# Patient Record
Sex: Female | Born: 1965 | Race: White | Hispanic: No | Marital: Married | Smoking: Never smoker
Health system: Southern US, Community
[De-identification: ages and names within clinical notes are randomized; demographics above are authoritative.]

## PROBLEM LIST (undated history)

## (undated) DIAGNOSIS — I10 Essential (primary) hypertension: Secondary | ICD-10-CM

## (undated) DIAGNOSIS — I639 Cerebral infarction, unspecified: Secondary | ICD-10-CM

## (undated) HISTORY — DX: Cerebral infarction, unspecified: I63.9

---

## 2015-07-12 ENCOUNTER — Inpatient Hospital Stay (HOSPITAL_COMMUNITY)
Admission: EM | Admit: 2015-07-12 | Discharge: 2015-07-24 | DRG: 004 | Disposition: A | Discharge status: Rehabilitation Facility | Payer: Self-pay | Attending: Neurology | Admitting: Neurology

## 2015-07-12 ENCOUNTER — Encounter (HOSPITAL_COMMUNITY): Payer: Self-pay | Admitting: Emergency Medicine

## 2015-07-12 ENCOUNTER — Emergency Department (HOSPITAL_COMMUNITY): Payer: Self-pay

## 2015-07-12 DIAGNOSIS — I5189 Other ill-defined heart diseases: Secondary | ICD-10-CM | POA: Insufficient documentation

## 2015-07-12 DIAGNOSIS — R0682 Tachypnea, not elsewhere classified: Secondary | ICD-10-CM | POA: Diagnosis not present

## 2015-07-12 DIAGNOSIS — I611 Nontraumatic intracerebral hemorrhage in hemisphere, cortical: Principal | ICD-10-CM | POA: Diagnosis present

## 2015-07-12 DIAGNOSIS — Z4659 Encounter for fitting and adjustment of other gastrointestinal appliance and device: Secondary | ICD-10-CM

## 2015-07-12 DIAGNOSIS — R29810 Facial weakness: Secondary | ICD-10-CM | POA: Diagnosis present

## 2015-07-12 DIAGNOSIS — J9690 Respiratory failure, unspecified, unspecified whether with hypoxia or hypercapnia: Secondary | ICD-10-CM

## 2015-07-12 DIAGNOSIS — Y95 Nosocomial condition: Secondary | ICD-10-CM | POA: Diagnosis not present

## 2015-07-12 DIAGNOSIS — N270 Small kidney, unilateral: Secondary | ICD-10-CM | POA: Diagnosis present

## 2015-07-12 DIAGNOSIS — E870 Hyperosmolality and hypernatremia: Secondary | ICD-10-CM | POA: Diagnosis not present

## 2015-07-12 DIAGNOSIS — G8191 Hemiplegia, unspecified affecting right dominant side: Secondary | ICD-10-CM | POA: Diagnosis present

## 2015-07-12 DIAGNOSIS — I639 Cerebral infarction, unspecified: Secondary | ICD-10-CM

## 2015-07-12 DIAGNOSIS — T502X5A Adverse effect of carbonic-anhydrase inhibitors, benzothiadiazides and other diuretics, initial encounter: Secondary | ICD-10-CM | POA: Diagnosis present

## 2015-07-12 DIAGNOSIS — Z79899 Other long term (current) drug therapy: Secondary | ICD-10-CM

## 2015-07-12 DIAGNOSIS — Z9119 Patient's noncompliance with other medical treatment and regimen: Secondary | ICD-10-CM

## 2015-07-12 DIAGNOSIS — W19XXXA Unspecified fall, initial encounter: Secondary | ICD-10-CM | POA: Diagnosis present

## 2015-07-12 DIAGNOSIS — J9600 Acute respiratory failure, unspecified whether with hypoxia or hypercapnia: Secondary | ICD-10-CM | POA: Insufficient documentation

## 2015-07-12 DIAGNOSIS — R05 Cough: Secondary | ICD-10-CM

## 2015-07-12 DIAGNOSIS — I509 Heart failure, unspecified: Secondary | ICD-10-CM | POA: Diagnosis present

## 2015-07-12 DIAGNOSIS — F4323 Adjustment disorder with mixed anxiety and depressed mood: Secondary | ICD-10-CM | POA: Diagnosis present

## 2015-07-12 DIAGNOSIS — I160 Hypertensive urgency: Secondary | ICD-10-CM | POA: Diagnosis present

## 2015-07-12 DIAGNOSIS — Z930 Tracheostomy status: Secondary | ICD-10-CM | POA: Insufficient documentation

## 2015-07-12 DIAGNOSIS — R058 Other specified cough: Secondary | ICD-10-CM

## 2015-07-12 DIAGNOSIS — I69391 Dysphagia following cerebral infarction: Secondary | ICD-10-CM | POA: Insufficient documentation

## 2015-07-12 DIAGNOSIS — Z0189 Encounter for other specified special examinations: Secondary | ICD-10-CM

## 2015-07-12 DIAGNOSIS — I739 Peripheral vascular disease, unspecified: Secondary | ICD-10-CM | POA: Diagnosis present

## 2015-07-12 DIAGNOSIS — R061 Stridor: Secondary | ICD-10-CM | POA: Diagnosis present

## 2015-07-12 DIAGNOSIS — R17 Unspecified jaundice: Secondary | ICD-10-CM | POA: Diagnosis present

## 2015-07-12 DIAGNOSIS — Y92009 Unspecified place in unspecified non-institutional (private) residence as the place of occurrence of the external cause: Secondary | ICD-10-CM

## 2015-07-12 DIAGNOSIS — D62 Acute posthemorrhagic anemia: Secondary | ICD-10-CM | POA: Diagnosis present

## 2015-07-12 DIAGNOSIS — R32 Unspecified urinary incontinence: Secondary | ICD-10-CM | POA: Diagnosis present

## 2015-07-12 DIAGNOSIS — R7989 Other specified abnormal findings of blood chemistry: Secondary | ICD-10-CM | POA: Diagnosis present

## 2015-07-12 DIAGNOSIS — Y838 Other surgical procedures as the cause of abnormal reaction of the patient, or of later complication, without mention of misadventure at the time of the procedure: Secondary | ICD-10-CM | POA: Diagnosis not present

## 2015-07-12 DIAGNOSIS — E669 Obesity, unspecified: Secondary | ICD-10-CM | POA: Insufficient documentation

## 2015-07-12 DIAGNOSIS — R4701 Aphasia: Secondary | ICD-10-CM | POA: Diagnosis present

## 2015-07-12 DIAGNOSIS — G936 Cerebral edema: Secondary | ICD-10-CM | POA: Diagnosis present

## 2015-07-12 DIAGNOSIS — Y92239 Unspecified place in hospital as the place of occurrence of the external cause: Secondary | ICD-10-CM | POA: Diagnosis not present

## 2015-07-12 DIAGNOSIS — I701 Atherosclerosis of renal artery: Secondary | ICD-10-CM | POA: Diagnosis present

## 2015-07-12 DIAGNOSIS — J9601 Acute respiratory failure with hypoxia: Secondary | ICD-10-CM | POA: Diagnosis present

## 2015-07-12 DIAGNOSIS — Z01818 Encounter for other preprocedural examination: Secondary | ICD-10-CM

## 2015-07-12 DIAGNOSIS — R197 Diarrhea, unspecified: Secondary | ICD-10-CM | POA: Diagnosis present

## 2015-07-12 DIAGNOSIS — I161 Hypertensive emergency: Secondary | ICD-10-CM | POA: Diagnosis present

## 2015-07-12 DIAGNOSIS — E876 Hypokalemia: Secondary | ICD-10-CM | POA: Diagnosis present

## 2015-07-12 DIAGNOSIS — J690 Pneumonitis due to inhalation of food and vomit: Secondary | ICD-10-CM | POA: Diagnosis not present

## 2015-07-12 DIAGNOSIS — I10 Essential (primary) hypertension: Secondary | ICD-10-CM | POA: Insufficient documentation

## 2015-07-12 DIAGNOSIS — I110 Hypertensive heart disease with heart failure: Secondary | ICD-10-CM | POA: Diagnosis present

## 2015-07-12 DIAGNOSIS — Z91199 Patient's noncompliance with other medical treatment and regimen due to unspecified reason: Secondary | ICD-10-CM | POA: Insufficient documentation

## 2015-07-12 DIAGNOSIS — Z6841 Body Mass Index (BMI) 40.0 and over, adult: Secondary | ICD-10-CM

## 2015-07-12 DIAGNOSIS — I619 Nontraumatic intracerebral hemorrhage, unspecified: Secondary | ICD-10-CM | POA: Diagnosis present

## 2015-07-12 DIAGNOSIS — R1310 Dysphagia, unspecified: Secondary | ICD-10-CM | POA: Diagnosis present

## 2015-07-12 DIAGNOSIS — R739 Hyperglycemia, unspecified: Secondary | ICD-10-CM | POA: Diagnosis present

## 2015-07-12 DIAGNOSIS — I613 Nontraumatic intracerebral hemorrhage in brain stem: Secondary | ICD-10-CM | POA: Diagnosis present

## 2015-07-12 DIAGNOSIS — J95851 Ventilator associated pneumonia: Secondary | ICD-10-CM | POA: Diagnosis not present

## 2015-07-12 HISTORY — DX: Cerebral infarction, unspecified: I63.9

## 2015-07-12 HISTORY — DX: Essential (primary) hypertension: I10

## 2015-07-12 LAB — PROTIME-INR
INR: 1.18 (ref 0.00–1.49)
Prothrombin Time: 15.1 seconds (ref 11.6–15.2)

## 2015-07-12 LAB — DIFFERENTIAL
BASOS ABS: 0 10*3/uL (ref 0.0–0.1)
Basophils Relative: 0 %
EOS PCT: 3 %
Eosinophils Absolute: 0.3 10*3/uL (ref 0.0–0.7)
LYMPHS ABS: 1.2 10*3/uL (ref 0.7–4.0)
Lymphocytes Relative: 11 %
MONO ABS: 0.6 10*3/uL (ref 0.1–1.0)
Monocytes Relative: 5 %
NEUTROS ABS: 9.2 10*3/uL — AB (ref 1.7–7.7)
NEUTROS PCT: 81 %

## 2015-07-12 LAB — CBC
HEMATOCRIT: 37.8 % (ref 36.0–46.0)
Hemoglobin: 11.4 g/dL — ABNORMAL LOW (ref 12.0–15.0)
MCH: 21.8 pg — AB (ref 26.0–34.0)
MCHC: 30.2 g/dL (ref 30.0–36.0)
MCV: 72.1 fL — ABNORMAL LOW (ref 78.0–100.0)
Platelets: 359 10*3/uL (ref 150–400)
RBC: 5.24 MIL/uL — ABNORMAL HIGH (ref 3.87–5.11)
RDW: 19 % — AB (ref 11.5–15.5)
WBC: 11.3 10*3/uL — ABNORMAL HIGH (ref 4.0–10.5)

## 2015-07-12 LAB — I-STAT TROPONIN, ED: Troponin i, poc: 0.01 ng/mL (ref 0.00–0.08)

## 2015-07-12 LAB — I-STAT CHEM 8, ED
BUN: 8 mg/dL (ref 6–20)
CALCIUM ION: 1.09 mmol/L — AB (ref 1.12–1.23)
Chloride: 102 mmol/L (ref 101–111)
Creatinine, Ser: 0.7 mg/dL (ref 0.44–1.00)
Glucose, Bld: 120 mg/dL — ABNORMAL HIGH (ref 65–99)
HEMATOCRIT: 39 % (ref 36.0–46.0)
HEMOGLOBIN: 13.3 g/dL (ref 12.0–15.0)
Potassium: 2.9 mmol/L — ABNORMAL LOW (ref 3.5–5.1)
SODIUM: 143 mmol/L (ref 135–145)
TCO2: 27 mmol/L (ref 0–100)

## 2015-07-12 LAB — CBG MONITORING, ED: Glucose-Capillary: 116 mg/dL — ABNORMAL HIGH (ref 65–99)

## 2015-07-12 LAB — APTT: APTT: 30 s (ref 24–37)

## 2015-07-12 MED ORDER — ACETAMINOPHEN 650 MG RE SUPP
650.0000 mg | RECTAL | Status: DC | PRN
  Administered 2015-07-18 – 2015-07-19 (×3): 650 mg via RECTAL
  Filled 2015-07-12 (×3): qty 1

## 2015-07-12 MED ORDER — ACETAMINOPHEN 325 MG PO TABS
650.0000 mg | ORAL_TABLET | ORAL | Status: DC | PRN
  Administered 2015-07-14: 650 mg via ORAL
  Filled 2015-07-12: qty 2

## 2015-07-12 MED ORDER — SODIUM CHLORIDE 0.9 % IV SOLN
INTRAVENOUS | Status: DC
  Administered 2015-07-13 – 2015-07-21 (×7): via INTRAVENOUS

## 2015-07-12 MED ORDER — NICARDIPINE HCL IN NACL 20-0.86 MG/200ML-% IV SOLN
INTRAVENOUS | Status: AC
  Filled 2015-07-12: qty 200

## 2015-07-12 MED ORDER — LABETALOL HCL 5 MG/ML IV SOLN
10.0000 mg | Freq: Once | INTRAVENOUS | Status: AC
  Administered 2015-07-12: 10 mg via INTRAVENOUS
  Filled 2015-07-12: qty 4

## 2015-07-12 MED ORDER — POTASSIUM CHLORIDE 10 MEQ/100ML IV SOLN
10.0000 meq | INTRAVENOUS | Status: AC
  Administered 2015-07-13 (×4): 10 meq via INTRAVENOUS
  Filled 2015-07-12 (×4): qty 100

## 2015-07-12 MED ORDER — STROKE: EARLY STAGES OF RECOVERY BOOK
Freq: Once | Status: AC
  Administered 2015-07-13: 03:00:00
  Filled 2015-07-12: qty 1

## 2015-07-12 MED ORDER — NICARDIPINE HCL IN NACL 20-0.86 MG/200ML-% IV SOLN
3.0000 mg/h | INTRAVENOUS | Status: DC
  Administered 2015-07-13: 5 mg/h via INTRAVENOUS
  Administered 2015-07-13: 15 mg/h via INTRAVENOUS
  Administered 2015-07-13: 12 mg/h via INTRAVENOUS
  Administered 2015-07-13: 6 mg/h via INTRAVENOUS
  Administered 2015-07-13: 12 mg/h via INTRAVENOUS
  Administered 2015-07-13: 14 mg/h via INTRAVENOUS
  Administered 2015-07-14: 7 mg/h via INTRAVENOUS
  Administered 2015-07-14: 6 mg/h via INTRAVENOUS
  Administered 2015-07-14: 5 mg/h via INTRAVENOUS
  Administered 2015-07-14: 7 mg/h via INTRAVENOUS
  Administered 2015-07-14 – 2015-07-15 (×2): 2 mg/h via INTRAVENOUS
  Administered 2015-07-15: 4 mg/h via INTRAVENOUS
  Administered 2015-07-15: 7 mg/h via INTRAVENOUS
  Administered 2015-07-15: 9 mg/h via INTRAVENOUS
  Administered 2015-07-15: 2 mg/h via INTRAVENOUS
  Administered 2015-07-16 (×3): 10 mg/h via INTRAVENOUS
  Filled 2015-07-12: qty 400
  Filled 2015-07-12 (×3): qty 200
  Filled 2015-07-12: qty 400
  Filled 2015-07-12 (×2): qty 200
  Filled 2015-07-12: qty 400
  Filled 2015-07-12 (×2): qty 200
  Filled 2015-07-12: qty 400
  Filled 2015-07-12 (×4): qty 200
  Filled 2015-07-12: qty 2800
  Filled 2015-07-12 (×2): qty 200

## 2015-07-12 NOTE — ED Provider Notes (Addendum)
CSN: [id number]     Arrival date & time 07/12/15  2316 History  By signing my name below, I, Gwendolyn Morales, attest that this documentation has been prepared under the direction and in the presence of Tomasita CrumbleAdeleke Paige Monarrez, MD. Electronically signed, Gwendolyn Morales, ED Scribe. 07/12/2015. 11:46 PM.     No chief complaint on file.  LEVEL 5 CAVEAT DUE TO ACUITY OF CONDITION The history is provided by the EMS personnel. No language interpreter was used.   HPI Comments: Gwendolyn Morales is a 50 y.o. female brought in by ambulance, who presents to the Emergency Department here for code stroke. Pt c/o slurred speech and right sided weakness.  History obtained from EMS who states husband found her on the floor prior to calling 911.  No past medical history on file. No past surgical history on file. No family history on file. Social History  Substance Use Topics  . Smoking status: Not on file  . Smokeless tobacco: Not on file  . Alcohol Use: Not on file   OB History    No data available     Review of Systems  Unable to perform ROS: Acuity of condition      Allergies  Review of patient's allergies indicates not on file.  Home Medications   Prior to Admission medications   Not on File   There were no vitals taken for this visit. Physical Exam  Constitutional: She appears well-developed and well-nourished. No distress.  HENT:  Head: Normocephalic and atraumatic.  Nose: Nose normal.  Mouth/Throat: Oropharynx is clear and moist. No oropharyngeal exudate.  Eyes: Conjunctivae and EOM are normal. Pupils are equal, round, and reactive to light. No scleral icterus.  Neck: Normal range of motion. Neck supple. No JVD present. No tracheal deviation present. No thyromegaly present.  Cardiovascular: Normal rate, regular rhythm and normal heart sounds.  Exam reveals no gallop and no friction rub.   No murmur heard. Pulmonary/Chest: Effort normal and breath sounds normal. No respiratory distress. She has no  wheezes. She exhibits no tenderness.  Abdominal: Soft. Bowel sounds are normal. She exhibits no distension and no mass. There is no tenderness. There is no rebound and no guarding.  Musculoskeletal: Normal range of motion. She exhibits no edema or tenderness.  Lymphadenopathy:    She has no cervical adenopathy.  Neurological:  Slurred speech, 3/5 strength RUE, RLE, 5/5 strentgh LUE, LLE, No gaze deviation  Skin: Skin is warm and dry. No rash noted. No erythema. No pallor.  Nursing note and vitals reviewed.   ED Course  Procedures  DIAGNOSTIC STUDIES: Oxygen Saturation is 97% on room air, normal by my interpretation.  COORDINATION OF CARE: 11:49 PM-Will order blood work, CT Head w contrast. Discussed treatment plan with pt at bedside and pt agreed to plan.   Labs Review Labs Reviewed  CBC - Abnormal; Notable for the following:    WBC 11.3 (*)    RBC 5.24 (*)    Hemoglobin 11.4 (*)    MCV 72.1 (*)    MCH 21.8 (*)    RDW 19.0 (*)    All other components within normal limits  DIFFERENTIAL - Abnormal; Notable for the following:    Neutro Abs 9.2 (*)    All other components within normal limits  COMPREHENSIVE METABOLIC PANEL - Abnormal; Notable for the following:    Potassium 2.8 (*)    Glucose, Bld 123 (*)    All other components within normal limits  I-STAT CHEM 8, ED -  Abnormal; Notable for the following:    Potassium 2.9 (*)    Glucose, Bld 120 (*)    Calcium, Ion 1.09 (*)    All other components within normal limits  CBG MONITORING, ED - Abnormal; Notable for the following:    Glucose-Capillary 116 (*)    All other components within normal limits  ETHANOL  PROTIME-INR  APTT  URINE RAPID DRUG SCREEN, HOSP PERFORMED  URINALYSIS, ROUTINE W REFLEX MICROSCOPIC (NOT AT [facility])  I-STAT TROPOININ, ED    Imaging Review Ct Head Wo Contrast  07/12/2015  CLINICAL DATA:  Code stroke. Right-sided weakness and slurred speech. Initial encounter. EXAM: CT HEAD WITHOUT CONTRAST  TECHNIQUE: Contiguous axial images were obtained from the base of the skull through the vertex without intravenous contrast. COMPARISON:  None. FINDINGS: There is a focus of acute hemorrhage at the left parietal lobe, measuring 1.7 x 1.3 cm. There also appears to be a small focus of acute hemorrhage at the left side of the pons, measuring 0.6 cm. No significant mass effect or midline shift is seen. Scattered periventricular and subcortical white matter change likely reflects small vessel ischemic microangiopathy. Mild chronic ischemic change is noted at the basal ganglia bilaterally. There is no evidence of fracture; visualized osseous structures are unremarkable in appearance. The orbits are within normal limits. The paranasal sinuses and mastoid air cells are well-aerated. No significant soft tissue abnormalities are seen. IMPRESSION: 1. Acute hemorrhage at the left parietal lobe, measuring 1.7 x 1.3 cm. 2. Small focus of acute hemorrhage at the left side of the pons, measuring 0.6 cm. 3. Scattered small vessel ischemic microangiopathy and mild chronic ischemic change at the basal ganglia bilaterally. Critical Value/emergent results were called by telephone at the time of interpretation on 07/12/2015 at 11:33 pm to Dr. Roseanne Reno, who verbally acknowledged these results. Electronically Signed   By: Roanna Raider M.D.   On: 07/12/2015 23:34   I have personally reviewed and evaluated these images and lab results as part of my medical decision-making.   EKG Interpretation   Date/Time:  Thursday July 12 2015 23:39:22 EDT Ventricular Rate:  90 PR Interval:  177 QRS Duration: 115 QT Interval:  416 QTC Calculation: 509 R Axis:   40 Text Interpretation:  Sinus rhythm Prolonged QT Baseline wander in lead(s)  II III aVF V6 No old tracing to compare Confirmed by Erroll Luna  [id number]) on 07/12/2015 11:47:34 PM      MDM   Final diagnoses:  None   Patient presents to the ED for sudden onset slurred  speech and R side weakness.  Code stroke called and CT reveals a hemorrhagic focus on the L side, corresponding with her symptoms.  Dr. Roseanne Reno with neurology has evaluated the patient as well and will admit for further care.  She continues to have slurred speech but is handling her own secretions.  Will continue to close monitor for any worsening.   CRITICAL CARE Performed by: Tomasita Crumble   Total critical care time: 40 minutes - hemorrhagic stroke  Critical care time was exclusive of separately billable procedures and treating other patients.  Critical care was necessary to treat or prevent imminent or life-threatening deterioration.  Critical care was time spent personally by me on the following activities: development of treatment plan with patient and/or surrogate as well as nursing, discussions with consultants, evaluation of patient's response to treatment, examination of patient, obtaining history from patient or surrogate, ordering and performing treatments and interventions, ordering and review of  laboratory studies, ordering and review of radiographic studies, pulse oximetry and re-evaluation of patient's condition.    I personally performed the services described in this documentation, which was scribed in my presence. The recorded information has been reviewed and is accurate.       Tomasita Crumble, MD 07/13/15 0020

## 2015-07-13 ENCOUNTER — Encounter (HOSPITAL_COMMUNITY): Payer: Self-pay

## 2015-07-13 ENCOUNTER — Inpatient Hospital Stay (HOSPITAL_COMMUNITY): Payer: MEDICAID

## 2015-07-13 ENCOUNTER — Inpatient Hospital Stay (HOSPITAL_COMMUNITY): Payer: Self-pay

## 2015-07-13 ENCOUNTER — Encounter (HOSPITAL_COMMUNITY): Payer: Self-pay | Admitting: Pulmonary Disease

## 2015-07-13 DIAGNOSIS — I161 Hypertensive emergency: Secondary | ICD-10-CM

## 2015-07-13 DIAGNOSIS — I613 Nontraumatic intracerebral hemorrhage in brain stem: Secondary | ICD-10-CM

## 2015-07-13 DIAGNOSIS — I10 Essential (primary) hypertension: Secondary | ICD-10-CM

## 2015-07-13 DIAGNOSIS — G936 Cerebral edema: Secondary | ICD-10-CM

## 2015-07-13 LAB — COMPREHENSIVE METABOLIC PANEL
ALBUMIN: 3.8 g/dL (ref 3.5–5.0)
ALT: 19 U/L (ref 14–54)
AST: 20 U/L (ref 15–41)
Alkaline Phosphatase: 97 U/L (ref 38–126)
Anion gap: 8 (ref 5–15)
BILIRUBIN TOTAL: 0.4 mg/dL (ref 0.3–1.2)
BUN: 7 mg/dL (ref 6–20)
CALCIUM: 9.1 mg/dL (ref 8.9–10.3)
CO2: 27 mmol/L (ref 22–32)
CREATININE: 0.78 mg/dL (ref 0.44–1.00)
Chloride: 104 mmol/L (ref 101–111)
GFR calc Af Amer: >60 mL/min (ref >60)
GFR calc non Af Amer: >60 mL/min (ref >60)
GLUCOSE: 123 mg/dL — AB (ref 65–99)
Potassium: 2.8 mmol/L — ABNORMAL LOW (ref 3.5–5.1)
Sodium: 139 mmol/L (ref 135–145)
TOTAL PROTEIN: 7.2 g/dL (ref 6.5–8.1)

## 2015-07-13 LAB — BASIC METABOLIC PANEL
Anion gap: 9 (ref 5–15)
BUN: 12 mg/dL (ref 6–20)
CALCIUM: 8.5 mg/dL — AB (ref 8.9–10.3)
CHLORIDE: 109 mmol/L (ref 101–111)
CO2: 24 mmol/L (ref 22–32)
CREATININE: 0.91 mg/dL (ref 0.44–1.00)
GFR calc Af Amer: >60 mL/min (ref >60)
GFR calc non Af Amer: >60 mL/min (ref >60)
Glucose, Bld: 122 mg/dL — ABNORMAL HIGH (ref 65–99)
Potassium: 3.4 mmol/L — ABNORMAL LOW (ref 3.5–5.1)
Sodium: 142 mmol/L (ref 135–145)

## 2015-07-13 LAB — MRSA PCR SCREENING: MRSA BY PCR: NEGATIVE

## 2015-07-13 LAB — LIPID PANEL
CHOL/HDL RATIO: 3.1 ratio
CHOLESTEROL: 187 mg/dL (ref 0–200)
HDL: 60 mg/dL (ref >40)
LDL Cholesterol: 107 mg/dL — ABNORMAL HIGH (ref 0–99)
Triglycerides: 98 mg/dL (ref <150)
VLDL: 20 mg/dL (ref 0–40)

## 2015-07-13 LAB — POCT I-STAT 3, ART BLOOD GAS (G3+)
ACID-BASE DEFICIT: 1 mmol/L (ref 0.0–2.0)
BICARBONATE: 24.9 meq/L — AB (ref 20.0–24.0)
O2 Saturation: 91 %
TCO2: 26 mmol/L (ref 0–100)
pCO2 arterial: 45.1 mmHg — ABNORMAL HIGH (ref 35.0–45.0)
pH, Arterial: 7.349 — ABNORMAL LOW (ref 7.350–7.450)
pO2, Arterial: 63 mmHg — ABNORMAL LOW (ref 80.0–100.0)

## 2015-07-13 LAB — ETHANOL: Alcohol, Ethyl (B): <5 mg/dL (ref <5)

## 2015-07-13 LAB — MAGNESIUM: Magnesium: 1.7 mg/dL (ref 1.7–2.4)

## 2015-07-13 LAB — PHOSPHORUS: Phosphorus: 4.5 mg/dL (ref 2.5–4.6)

## 2015-07-13 LAB — GLUCOSE, CAPILLARY: Glucose-Capillary: 122 mg/dL — ABNORMAL HIGH (ref 65–99)

## 2015-07-13 LAB — TRIGLYCERIDES: Triglycerides: 138 mg/dL (ref <150)

## 2015-07-13 MED ORDER — PANTOPRAZOLE SODIUM 40 MG PO PACK
40.0000 mg | PACK | ORAL | Status: DC
  Administered 2015-07-14 – 2015-07-23 (×10): 40 mg
  Filled 2015-07-13 (×10): qty 20

## 2015-07-13 MED ORDER — PRO-STAT SUGAR FREE PO LIQD
30.0000 mL | Freq: Two times a day (BID) | ORAL | Status: DC
  Administered 2015-07-13 – 2015-07-14 (×2): 30 mL
  Filled 2015-07-13 (×2): qty 30

## 2015-07-13 MED ORDER — IOPAMIDOL (ISOVUE-370) INJECTION 76%
INTRAVENOUS | Status: AC
  Administered 2015-07-13: 50 mL
  Filled 2015-07-13: qty 50

## 2015-07-13 MED ORDER — VECURONIUM BROMIDE 10 MG IV SOLR
INTRAVENOUS | Status: AC
  Filled 2015-07-13: qty 10

## 2015-07-13 MED ORDER — FENTANYL CITRATE (PF) 100 MCG/2ML IJ SOLN
INTRAMUSCULAR | Status: AC
  Administered 2015-07-13: 200 ug via INTRAVENOUS
  Filled 2015-07-13: qty 4

## 2015-07-13 MED ORDER — VITAL HIGH PROTEIN PO LIQD
1000.0000 mL | ORAL | Status: DC
  Administered 2015-07-13: 1000 mL
  Administered 2015-07-13: 23:00:00

## 2015-07-13 MED ORDER — FENTANYL CITRATE (PF) 100 MCG/2ML IJ SOLN
200.0000 ug | Freq: Once | INTRAMUSCULAR | Status: AC
  Administered 2015-07-13: 200 ug via INTRAVENOUS

## 2015-07-13 MED ORDER — ANTISEPTIC ORAL RINSE SOLUTION (CORINZ)
7.0000 mL | OROMUCOSAL | Status: DC
  Administered 2015-07-13 – 2015-07-22 (×84): 7 mL via OROMUCOSAL

## 2015-07-13 MED ORDER — PROPOFOL 1000 MG/100ML IV EMUL
INTRAVENOUS | Status: AC
  Administered 2015-07-13: 20 ug/kg/min via INTRAVENOUS
  Filled 2015-07-13: qty 100

## 2015-07-13 MED ORDER — MIDAZOLAM HCL 2 MG/2ML IJ SOLN
INTRAMUSCULAR | Status: AC
  Administered 2015-07-13: 4 mg via INTRAVENOUS
  Filled 2015-07-13: qty 4

## 2015-07-13 MED ORDER — PROPOFOL 1000 MG/100ML IV EMUL
0.0000 ug/kg/min | INTRAVENOUS | Status: DC
  Administered 2015-07-13: 40 ug/kg/min via INTRAVENOUS
  Administered 2015-07-13: 20 ug/kg/min via INTRAVENOUS
  Administered 2015-07-13: 35 ug/kg/min via INTRAVENOUS
  Administered 2015-07-14: 30 ug/kg/min via INTRAVENOUS
  Administered 2015-07-14: 45 ug/kg/min via INTRAVENOUS
  Administered 2015-07-14: 40 ug/kg/min via INTRAVENOUS
  Administered 2015-07-14 (×3): 35 ug/kg/min via INTRAVENOUS
  Administered 2015-07-15: 25.058 ug/kg/min via INTRAVENOUS
  Administered 2015-07-15: 45 ug/kg/min via INTRAVENOUS
  Administered 2015-07-15: 25 ug/kg/min via INTRAVENOUS
  Administered 2015-07-15 – 2015-07-16 (×2): 45 ug/kg/min via INTRAVENOUS
  Administered 2015-07-16 (×2): 30 ug/kg/min via INTRAVENOUS
  Administered 2015-07-16: 20 ug/kg/min via INTRAVENOUS
  Administered 2015-07-17: 50 ug/kg/min via INTRAVENOUS
  Administered 2015-07-17: 35 ug/kg/min via INTRAVENOUS
  Administered 2015-07-17: 40 ug/kg/min via INTRAVENOUS
  Administered 2015-07-18 – 2015-07-19 (×2): 50 ug/kg/min via INTRAVENOUS
  Administered 2015-07-19: 20 ug/kg/min via INTRAVENOUS
  Administered 2015-07-19: 35 ug/kg/min via INTRAVENOUS
  Filled 2015-07-13 (×30): qty 100

## 2015-07-13 MED ORDER — CHLORHEXIDINE GLUCONATE 0.12% ORAL RINSE (MEDLINE KIT)
15.0000 mL | Freq: Two times a day (BID) | OROMUCOSAL | Status: DC
  Administered 2015-07-13 – 2015-07-21 (×17): 15 mL via OROMUCOSAL

## 2015-07-13 MED ORDER — MIDAZOLAM HCL 2 MG/2ML IJ SOLN
4.0000 mg | Freq: Once | INTRAMUSCULAR | Status: AC
Start: 2015-07-13 — End: 2015-07-13
  Administered 2015-07-13: 4 mg via INTRAVENOUS

## 2015-07-13 NOTE — Evaluation (Signed)
Speech Language Pathology Evaluation Patient Details Name: Gwendolyn Morales MRN: [id number] DOB: 06/28/1965 Today's Date: 07/13/2015 Time: [id number] SLP Time Calculation (min) (ACUTE ONLY): 20 min  Problem List:  Patient Active Problem List   Diagnosis Date Noted  . 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 ICH (intracerebral hemorrhage) (HCC) 07/12/2015   Past Medical History: History reviewed. No pertinent past medical history. Past Surgical History: History reviewed. No pertinent past surgical history. HPI:  50 y.o. female with a history of hypertension and noncompliance with treatment brought to the ED and code stroke status following acute onset of a fall, speech difficulty and right-sided weakness. CT  head showed a 1.7 x 1.3 cm acute left parietal hemorrhage and a 0.6 cm hemorrhage involving the left lateral pons. No CXR.   Assessment / Plan / Recommendation Clinical Impression  Pt's speech intelligibility impacted by mod-severe dysarthria marked by decreased respiratory support, hypernasality, decreased vocal intensity and phonemic imprecision. Administered parts of bedside Western Aphasia Battery assessment. Mild language impairments, expressive > receptive for mildly complex information, dysnomia and dysgraphia. Intermittent awareness of verbal errors. She is stimulable for therapeutic cues for deep inhalation at word level and increase intensity. Pt is an excellent inpatient rehab candidate. ST will continue while on acute care.      SLP Assessment  Patient needs continued Speech Lanaguage Pathology Services    Follow Up Recommendations  Inpatient Rehab    Frequency and Duration min 2x/week  2 weeks      SLP Evaluation Prior Functioning  Cognitive/Linguistic Baseline: Within functional limits Type of Home: House  Lives With: Spouse Available Help at Discharge: Family Vocation: Unemployed   Cognition  Overall Cognitive Status:  Impaired/Different from baseline Arousal/Alertness: Awake/alert Orientation Level: Oriented to person;Oriented to place Attention: Sustained Sustained Attention: Impaired Sustained Attention Impairment: Verbal basic;Functional basic Memory:  (appears fuctional informally) Awareness: Impaired Awareness Impairment: Emergent impairment;Anticipatory impairment Problem Solving:  (TBA further) Behaviors:  (borderline labile) Safety/Judgment: Appears intact    Comprehension  Auditory Comprehension Overall Auditory Comprehension: Impaired Yes/No Questions: Impaired Other Yes/No Questions Comments`: Minimal (basic bio and min complex) Commands: Impaired Complex Commands: 75-100% accurate Conversation: Simple Interfering Components: Attention EffectiveTechniques: Extra processing time Visual Recognition/Discrimination Discrimination: Not tested Reading Comprehension Reading Status:  (TBA)    Expression Expression Primary Mode of Expression: Verbal Verbal Expression Overall Verbal Expression: Impaired Initiation: No impairment Level of Generative/Spontaneous Verbalization: Sentence Repetition: Impaired Level of Impairment: Sentence level Naming: Impairment Confrontation: Impaired (4/6) Convergent: Not tested Divergent: Not tested Verbal Errors: Phonemic paraphasias;Aware of errors;Semantic paraphasias Pragmatics: No impairment Written Expression Dominant Hand: Right Written Expression: Exceptions to [facility] Clif[facility] LLCWFL Self Formulation Ability: Word;Phrase   Oral / Motor  Oral Motor/Sensory Function Overall Oral Motor/Sensory Function: Mild impairment Facial ROM: Reduced right Facial Symmetry: Abnormal symmetry right Facial Strength: Reduced left Facial Sensation: Within Functional Limits Lingual ROM: Reduced right Lingual Symmetry: Within Functional Limits Lingual Strength: Reduced Motor Speech Overall Motor Speech: Impaired Respiration: Impaired Level of Impairment: Word Phonation:  Low vocal intensity Resonance: Hypernasality Articulation: Impaired Level of Impairment: Phrase Intelligibility: Intelligibility reduced Word: 50-74% accurate Phrase: 25-49% accurate Sentence: 0-24% accurate Motor Planning: Impaired Level of Impairment: Phrase Motor Speech Errors: Aware Effective Techniques: Slow rate;Increased vocal intensity;Over-articulate   GO                    Royce MacadamiaLitaker, Keeton Kassebaum Willis 07/13/2015, 1:59 PM  Breck CoonsLisa Willis Lonell FaceLitaker M.Ed [employer]P Pager [phone]

## 2015-07-13 NOTE — Progress Notes (Addendum)
PT Cancellation Note  Patient Details Name: Gwendolyn SimonSherry Morales MRN: [id number] DOB: 26-Jul-1965   Cancelled Treatment:    Reason Eval/Treat Not Completed: Other (comment).  Pt on bedrest.  Dr. [name] paged to asked if we can take pt off of bedrest to proceed with evaluation.  Awaiting update.  Thanks,    Rollene Rotundaebecca B. Drakkar Medeiros, PT, DPT [phone]   07/13/2015, 9:48 AM  07/13/2015 @9 :48- Spoke with Dr. Bascom LevelsSethi-he would like bedrest today and reports ok to start mobility/evaluations tomorrow, Saturday 07/14/15. Rollene Rotundaebecca B. Iyania Denne, PT, DPT [phone]

## 2015-07-13 NOTE — Procedures (Signed)
Intubation Procedure Note Jerre SimonSherry Vuncannon [id number] [birth year]  Procedure: Intubation Indications: Airway protection and maintenance  Procedure Details Consent: Risks of procedure as well as the alternatives and risks of each were explained to the (patient/caregiver).  Consent for procedure obtained. Time Out: Verified patient identification, verified procedure, site/side was marked, verified correct patient position, special equipment/implants available, medications/allergies/relevent history reviewed, required imaging and test results available.  Performed  Maximum sterile technique was used including gloves, gown, hand hygiene and mask.  MAC and 4    Evaluation Hemodynamic Status: BP stable throughout; O2 sats: stable throughout Patient's Current Condition: stable Complications: No apparent complications Patient did tolerate procedure well. Chest X-ray ordered to verify placement.  CXR: pending.   Nelda BucksFEINSTEIN,DANIEL J. 07/13/2015  Rapid declines in minutes Stridor Cords swollen moderate but narrowing fast, mild secretions Could not pass 7.5,  Able to pass 7.0 easily  Mcarthur Rossettianiel J. Tyson AliasFeinstein, MD, FACP Pgr: [phone] [facility]

## 2015-07-13 NOTE — Evaluation (Signed)
Clinical/Bedside Swallow Evaluation Patient Details  Name: Jerre SimonSherry Toso MRN: [id number] Date of Birth: 27-Mar-1965  Today's Date: 07/13/2015 Time:   SLP Stop Time (ACUTE ONLY): 1148    Past Medical History: History reviewed. No pertinent past medical history. Past Surgical History: History reviewed. No pertinent past surgical history. HPI:  50 y.o. female with a history of hypertension and noncompliance with treatment brought to the ED and code stroke status following acute onset of a fall, speech difficulty and right-sided weakness. CT  head showed a 1.7 x 1.3 cm acute left parietal hemorrhage and a 0.6 cm hemorrhage involving the left lateral pons. No CXR.   Assessment / Plan / Recommendation Clinical Impression  Pt presents with hypernasal vocal quality, questionable flaccid osft palate/uvula with snoring like respirations when awake. Probable aspiration with thin evidenced by immediate cough due to decreased oral cohesion or inadequate laryngeal closure. Nectar thick resulted in suspected delayed swallow initiation without s/s aspiration. Mild lingual residue/pocketing following solid texture therefore recommend Dys 2, nectar consistency, straws allowed, sit upright and full supervision initially. ST will continue for safety/efficiency with current recommendations and upgrade.     Aspiration Risk  Moderate aspiration risk    Diet Recommendation Dysphagia 2 (Fine chop);Nectar-thick liquid   Medication Administration: Crushed with puree Supervision: Full supervision/cueing for compensatory strategies;Patient able to self feed Compensations: Slow rate;Small sips/bites;Lingual sweep for clearance of pocketing    Other  Recommendations Oral Care Recommendations: Oral care BID   Follow up Recommendations  Inpatient Rehab    Frequency and Duration min 2x/week  2 weeks       Prognosis Prognosis for Safe Diet Advancement: Good      Swallow Study   General HPI: 50 y.o. female with a  history of hypertension and noncompliance with treatment brought to the ED and code stroke status following acute onset of a fall, speech difficulty and right-sided weakness. CT  head showed a 1.7 x 1.3 cm acute left parietal hemorrhage and a 0.6 cm hemorrhage involving the left lateral pons. No CXR. Previous Swallow Assessment:  (none) Respiratory Status: Nasal cannula History of Recent Intubation: No Behavior/Cognition: Alert;Cooperative;Pleasant mood;Requires cueing Oral Care Completed by SLP: Yes Oral Cavity - Dentition: Adequate natural dentition Vision: Functional for self-feeding Baseline Vocal Quality: Low vocal intensity (hypernasal) Volitional Cough:  (unable to produce ) Volitional Swallow: Able to elicit    Oral/Motor/Sensory Function Overall Oral Motor/Sensory Function: Mild impairment Facial ROM: Reduced right Facial Symmetry: Abnormal symmetry right Facial Strength: Reduced left Facial Sensation: Within Functional Limits Lingual ROM: Reduced right Lingual Symmetry: Within Functional Limits Lingual Strength: Reduced   Ice Chips Ice chips: Not tested   Thin Liquid Thin Liquid: Impaired Presentation: Cup Oral Phase Impairments: Reduced labial seal Oral Phase Functional Implications: Right anterior spillage Pharyngeal  Phase Impairments: Multiple swallows;Suspected delayed Swallow;Cough - Immediate    Nectar Thick Nectar Thick Liquid: Impaired Presentation: Cup;Straw Pharyngeal Phase Impairments: Suspected delayed Swallow;Multiple swallows   Honey Thick Honey Thick Liquid: Not tested   Puree Puree: Impaired Pharyngeal Phase Impairments: Suspected delayed Swallow   Solid   GO   Solid: Impaired Oral Phase Impairments: Reduced lingual movement/coordination Oral Phase Functional Implications: Right lateral sulci pocketing        Roque CashLitaker, Breck CoonsLisa Willis 07/13/2015,1:34 PM  Breck CoonsLisa Willis BuckheadLitaker M.Ed [employer] Pager [phone]

## 2015-07-13 NOTE — Progress Notes (Signed)
OT Cancellation Note  Patient Details Name: Jerre SimonSherry Mohabir MRN: [id number] DOB: 17-Feb-1965   Cancelled Treatment:    Reason Eval/Treat Not Completed: Patient not medically ready (bedrest)  Felecia ShellingJones, Anastasiya Gowin B   Estel Scholze, Brynn   OTR/L Pager: [phone] Office: [phone] .  07/13/2015, 7:04 AM

## 2015-07-13 NOTE — Progress Notes (Signed)
H&p of neuro reviewed. CAm care she looks stable. RN at bedside.     ICD-9-CM ICD-10-CM   1. ICH (intracerebral hemorrhage) (HCC) 431 I61.9 VAS US CAROTID     VAS US CAROTID    No acute interventions from eMD   Dr. Kalman ShanMurali Ennifer Harston, M.D., [facility] L[facility] Regional Medical CenterF.C.C.P Pulmonary and Critical Care Medicine Staff Physician [facility]  Pulmonary and Critical Care Pager: [phone], If no answer or between  15:00h - 7:00h: call [phone]  07/13/2015 2:08 AM

## 2015-07-13 NOTE — H&P (Addendum)
Admission H&P    Chief Complaint: New-onset aphasia and right side weakness.  HPI: Gwendolyn SimonSherry Morales is an 50 y.o. female with a history of hypertension and noncompliance with treatment brought to the ED and code stroke status following acute onset of a physical fall at home and afterwards having difficulty with speech output as well as right-sided weakness. She has no previous history of stroke nor TIA. She has not been on antiplatelet therapy. Blood pressure was markedly elevated at 280/150, per EMS. Blood pressure remained elevated at 235/122 after 10 mg of labetalol 2. Cardene drip was started. CT scan of her head showed a 1.7 x 1.3 cm acute left parietal hemorrhage, as well as a 0.6 cm hemorrhage involving the left lateral pons. NIH stroke score was 10.   LSN: 8:15 PM on 07/12/2015 tPA Given: No: Acute intracranial hemorrhages mRankin:  Past medical history: Hypertension  History reviewed. No pertinent past surgical history.  Family history: Obtained and was noncontributory.  Social History:  reports that she has never smoked. She does not have any smokeless tobacco history on file. She reports that she does not drink alcohol or use illicit drugs.  Allergies: No Known Allergies  Medications: OTC antihistamine Multivitamins  ROS: History obtained from spouse.  General ROS: negative for - chills, fatigue, fever, night sweats, weight gain or weight loss Psychological ROS: negative for - behavioral disorder, hallucinations, memory difficulties, mood swings or suicidal ideation Ophthalmic ROS: negative for - blurry vision, double vision, eye pain or loss of vision ENT ROS: negative for - epistaxis, nasal discharge, oral lesions, sore throat, tinnitus or vertigo Allergy and Immunology ROS: negative for - hives or itchy/watery eyes Hematological and Lymphatic ROS: negative for - bleeding problems, bruising or swollen lymph nodes Endocrine ROS: negative for - galactorrhea, hair pattern  changes, polydipsia/polyuria or temperature intolerance Respiratory ROS: negative for - cough, hemoptysis, shortness of breath or wheezing Cardiovascular ROS: negative for - chest pain, dyspnea on exertion, edema or irregular heartbeat Gastrointestinal ROS: negative for - abdominal pain, diarrhea, hematemesis, nausea/vomiting or stool incontinence Genito-Urinary ROS: negative for - dysuria, hematuria, incontinence or urinary frequency/urgency Musculoskeletal ROS: Chronic left knee pain Neurological ROS: as noted in HPI Dermatological ROS: negative for rash and skin lesion changes  Physical Examination: Blood pressure 207/117, pulse 88, temperature 98.6 F (37 C), temperature source Oral, resp. rate 20, height 5\' 11"  (1.803 m), weight 115.985 kg (255 lb 11.2 oz), SpO2 97 %.  HEENT-  Normocephalic, no lesions, without obvious abnormality.  Normal external eye and conjunctiva.  Normal TM's bilaterally.  Normal auditory canals and external ears. Normal external nose, mucus membranes and septum.  Normal pharynx. Neck supple with no masses, nodes, nodules or enlargement. Cardiovascular - regular rate and rhythm, S1, S2 normal, no murmur, click, rub or gallop Lungs - chest clear, no wheezing, rales, normal symmetric air entry Abdomen - soft, non-tender; bowel sounds normal; no masses,  no organomegaly Extremities - no joint deformities, effusion, or inflammation and no edema  Neurologic Examination: Mental Status: Alert, moderate receptive and severe expressive aphasia. Difficulty following commands due to aphasia. Cranial Nerves: II-Visual fields were normal. III/IV/VI-Pupils were equal and reacted normally to light. Extraocular movements were full and conjugate.    V/VII-no facial numbness and no facial weakness. VIII-normal. X-mild dysarthria with nonsensical speech content. XI: trapezius strength/neck flexion strength normal bilaterally XII-midline tongue extension with normal  strength. Motor: Mild drift of right upper extremity and lower extremity; moderate drift of right lower extremity. Motor  exam is otherwise unremarkable. Deep Tendon Reflexes: 2+ and symmetric. Plantars: Flexor bilaterally Carotid auscultation: Normal  Results for orders placed or performed during the hospital encounter of 07/12/15 (from the past 48 hour(s))  Protime-INR     Status: None   Collection Time: 07/12/15 11:17 PM  Result Value Ref Range   Prothrombin Time 15.1 11.6 - 15.2 seconds   INR 1.18 0.00 - 1.49  APTT     Status: None   Collection Time: 07/12/15 11:17 PM  Result Value Ref Range   aPTT 30 24 - 37 seconds  CBC     Status: Abnormal   Collection Time: 07/12/15 11:17 PM  Result Value Ref Range   WBC 11.3 (H) 4.0 - 10.5 K/uL   RBC 5.24 (H) 3.87 - 5.11 MIL/uL   Hemoglobin 11.4 (L) 12.0 - 15.0 g/dL   HCT 56.3 87.5 - 64.3 %   MCV 72.1 (L) 78.0 - 100.0 fL   MCH 21.8 (L) 26.0 - 34.0 pg   MCHC 30.2 30.0 - 36.0 g/dL   RDW 32.9 (H) 51.8 - 84.1 %   Platelets 359 150 - 400 K/uL  Differential     Status: Abnormal   Collection Time: 07/12/15 11:17 PM  Result Value Ref Range   Neutrophils Relative % 81 %   Lymphocytes Relative 11 %   Monocytes Relative 5 %   Eosinophils Relative 3 %   Basophils Relative 0 %   Neutro Abs 9.2 (H) 1.7 - 7.7 K/uL   Lymphs Abs 1.2 0.7 - 4.0 K/uL   Monocytes Absolute 0.6 0.1 - 1.0 K/uL   Eosinophils Absolute 0.3 0.0 - 0.7 K/uL   Basophils Absolute 0.0 0.0 - 0.1 K/uL   Smear Review MORPHOLOGY UNREMARKABLE   I-stat troponin, ED (not at [facility], [facility])     Status: None   Collection Time: 07/12/15 11:19 PM  Result Value Ref Range   Troponin i, poc 0.01 0.00 - 0.08 ng/mL   Comment 3            Comment: Due to the release kinetics of cTnI, a negative result within the first hours of the onset of symptoms does not rule out myocardial infarction with certainty. If myocardial infarction is still suspected, repeat the test at appropriate intervals.    I-Stat Chem 8, ED  (not at [facility], [facility])     Status: Abnormal   Collection Time: 07/12/15 11:21 PM  Result Value Ref Range   Sodium 143 135 - 145 mmol/L   Potassium 2.9 (L) 3.5 - 5.1 mmol/L   Chloride 102 101 - 111 mmol/L   BUN 8 6 - 20 mg/dL   Creatinine, Ser 6.60 0.44 - 1.00 mg/dL   Glucose, Bld 630 (H) 65 - 99 mg/dL   Calcium, Ion 1.60 (L) 1.12 - 1.23 mmol/L   TCO2 27 0 - 100 mmol/L   Hemoglobin 13.3 12.0 - 15.0 g/dL   HCT 10.9 32.3 - 55.7 %  CBG monitoring, ED     Status: Abnormal   Collection Time: 07/12/15 11:30 PM  Result Value Ref Range   Glucose-Capillary 116 (H) 65 - 99 mg/dL   Ct Head Wo Contrast  07/12/2015  CLINICAL DATA:  Code stroke. Right-sided weakness and slurred speech. Initial encounter. EXAM: CT HEAD WITHOUT CONTRAST TECHNIQUE: Contiguous axial images were obtained from the base of the skull through the vertex without intravenous contrast. COMPARISON:  None. FINDINGS: There is a focus of acute hemorrhage at the left parietal lobe, measuring 1.7  x 1.3 cm. There also appears to be a small focus of acute hemorrhage at the left side of the pons, measuring 0.6 cm. No significant mass effect or midline shift is seen. Scattered periventricular and subcortical white matter change likely reflects small vessel ischemic microangiopathy. Mild chronic ischemic change is noted at the basal ganglia bilaterally. There is no evidence of fracture; visualized osseous structures are unremarkable in appearance. The orbits are within normal limits. The paranasal sinuses and mastoid air cells are well-aerated. No significant soft tissue abnormalities are seen. IMPRESSION: 1. Acute hemorrhage at the left parietal lobe, measuring 1.7 x 1.3 cm. 2. Small focus of acute hemorrhage at the left side of the pons, measuring 0.6 cm. 3. Scattered small vessel ischemic microangiopathy and mild chronic ischemic change at the basal ganglia bilaterally. Critical Value/emergent results were called by telephone at the  time of interpretation on 07/12/2015 at 11:33 pm to Dr. Roseanne Reno, who verbally acknowledged these results. Electronically Signed   By: Roanna Raider M.D.   On: 07/12/2015 23:34    Assessment: 50 y.o. female with a history of hypertension and noncompliance with treatment presenting with acute intraparenchymal hemorrhages as described above, as well as hypertensive emergency.  Stroke Risk Factors - hypertension  Plan: 1. HgbA1c, fasting lipid panel 2. Repeat CT scan of the head without contrast in 12-24 hours, or sooner if clinical status shows signs of deterioration 3. PT consult, OT consult, Speech consult 4. Echocardiogram 5. Carotid dopplers 6. Prophylactic therapy-None 7. Risk factor modification 8. Telemetry monitoring  This patient is critically ill and at significant risk of neurological worsening or death, and care requires constant monitoring of vital signs, hemodynamics,respiratory and cardiac monitoring, neurological assessment, discussion with family, other specialists and medical decision making of high complexity. Total critical care time was 60 minutes.  C.R. Roseanne Reno, MD [facility] [phone]  07/13/2015, 12:02 AM    ICH score.Marland Kitchen GCS 3-4: NO GCS <5: NO GCS 5-12: YES  +1 Infratentorial origin: YES  +1 Age >= 80: NO ICH volume >= 30ml:  NO Intraventricular Hemorrhage: NO ICH Score: 3        Gwendolyn Heady, MD

## 2015-07-13 NOTE — Progress Notes (Signed)
STROKE TEAM PROGRESS NOTE   HISTORY OF PRESENT ILLNESS (per record) Gwendolyn Morales is an 50 y.o. female with a history of hypertension and noncompliance with treatment brought to the ED and code stroke status following acute onset of a physical fall at home and afterwards having difficulty with speech output as well as right-sided weakness. She has no previous history of stroke nor TIA. She has not been on antiplatelet therapy. Blood pressure was markedly elevated at 280/150, per EMS. Blood pressure remained elevated at 235/122 after 10 mg of labetalol 2. Cardene drip was started. CT scan of her head showed a 1.7 x 1.3 cm acute left parietal hemorrhage, as well as a 0.6 cm hemorrhage involving the left lateral pons. NIH stroke score was 10. ICH score 3. She was LKW at 8:15 PM on 07/12/2015. Patient was not administered IV t-PA secondary to ICH. She was admitted to the neuro ICU for further evaluation and treatment.   SUBJECTIVE (INTERVAL HISTORY) Patient has long-standing history of hypertension but has been noncompliant with therapy. She presented with speech difficulties and right hemiparesis following a fall with significantly elevated blood pressure upon admission. She has remained neurologically stable overnight and blood pressure has been controlled on Cardene drip. No family at the bedside.   OBJECTIVE Temp:  [97.5 F (36.4 C)-98.6 F (37 C)] 97.5 F (36.4 C) (06/02 0400) Pulse Rate:  [75-112] 100 (06/02 0700) Cardiac Rhythm:  [-] Sinus tachycardia (06/02 0400) Resp:  [18-49] 30 (06/02 0700) BP: (119-207)/(51-122) 132/74 mmHg (06/02 0700) SpO2:  [94 %-100 %] 97 % (06/02 0700) Weight:  [114.4 kg (252 lb 3.3 oz)-115.985 kg (255 lb 11.2 oz)] 114.4 kg (252 lb 3.3 oz) (06/02 0102)  CBC:  Recent Labs Lab 07/12/15 2317 07/12/15 2321  WBC 11.3*  --   NEUTROABS 9.2*  --   HGB 11.4* 13.3  HCT 37.8 39.0  MCV 72.1*  --   PLT 359  --     Basic Metabolic Panel:  Recent Labs Lab  07/12/15 2317 07/12/15 2321  NA 139 143  K 2.8* 2.9*  CL 104 102  CO2 27  --   GLUCOSE 123* 120*  BUN 7 8  CREATININE 0.78 0.70  CALCIUM 9.1  --     Lipid Panel: No results found for: CHOL, TRIG, HDL, CHOLHDL, VLDL, LDLCALC HgbA1c: No results found for: HGBA1C Urine Drug Screen: No results found for: LABOPIA, COCAINSCRNUR, LABBENZ, AMPHETMU, THCU, LABBARB    IMAGING  Ct Head Wo Contrast  07/12/2015  CLINICAL DATA:  Code stroke. Right-sided weakness and slurred speech. Initial encounter. EXAM: CT HEAD WITHOUT CONTRAST TECHNIQUE: Contiguous axial images were obtained from the base of the skull through the vertex without intravenous contrast. COMPARISON:  None. FINDINGS: There is a focus of acute hemorrhage at the left parietal lobe, measuring 1.7 x 1.3 cm. There also appears to be a small focus of acute hemorrhage at the left side of the pons, measuring 0.6 cm. No significant mass effect or midline shift is seen. Scattered periventricular and subcortical white matter change likely reflects small vessel ischemic microangiopathy. Mild chronic ischemic change is noted at the basal ganglia bilaterally. There is no evidence of fracture; visualized osseous structures are unremarkable in appearance. The orbits are within normal limits. The paranasal sinuses and mastoid air cells are well-aerated. No significant soft tissue abnormalities are seen. IMPRESSION: 1. Acute hemorrhage at the left parietal lobe, measuring 1.7 x 1.3 cm. 2. Small focus of acute hemorrhage at the left side  of the pons, measuring 0.6 cm. 3. Scattered small vessel ischemic microangiopathy and mild chronic ischemic change at the basal ganglia bilaterally. Critical Value/emergent results were called by telephone at the time of interpretation on 07/12/2015 at 11:33 pm to Dr. Roseanne Reno, who verbally acknowledged these results. Electronically Signed   By: Roanna Raider M.D.   On: 07/12/2015 23:34    PHYSICAL EXAM Obese middle aged  Caucasian lady not in distress. . Afebrile. Head is nontraumatic. Neck is supple without bruit.    Cardiac exam no murmur or gallop. Lungs are clear to auscultation. Distal pulses are well felt. Neurological Exam :  Patient is awake and alert. Moderate dysarthria and expressive aphasia. Obvious nonfluency and word finding difficulty. Speech at times difficult to understand. She can comprehend one and two-step commands. Unable to name or repeat. Pupils right 3 mm left 5 mm both reactive. Fundi were not visualized. Extraocular eye movements are full range. No nystagmus. No facial weakness. Blinks to threat bilaterally. Mild right upper and lower extremity drift. Weakness of right grip, intrinsic hand muscles and right hip flexors. Sensation preserved bilaterally. Mild right-sided incoordination and proportionate to the weakness. Right plantar upgoing left downgoing. Gait was not tested.  NIH stroke scale 6. ICH scale 3. ASSESSMENT/PLAN [name] is a 50 y.o. female with history of HTN and noncompliance presenting with speech difficulty and R sided weakness following a fall. BP elevated. 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  CTA head  ordered  CTA neck  ordered  Carotid Doppler  canceled  2D Echo  pending   SCDs for VTE prophylaxis  Diet NPO time specified  No antithrombotic prior to admission  Ongoing aggressive stroke risk factor management  Therapy recommendations:  pending   Disposition:  pending   Hypertensive Emergency  BP 280/150 on arrival to hospital in setting of neuro symptoms  Started on cardene  Stable on cardene this am  SBP goal < 160  Other Stroke Risk Factors  Morbid  Obesity, Body mass index is 40.73 kg/(m^2).   Other Active Problems  Leukocytosis, WBC 11.3, Check labs am  Hypokalemia, K 2.9, replaced with 4 runs. Check labs am  Hospital day # 1  I have personally examined this patient, reviewed notes, independently viewed imaging studies, participated in medical decision making and plan of care. I have made any additions or clarifications directly to the above note. 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 She remains at risk for neurological worsening, hematoma expansion, cytotoxic edema, hydrocephalus and needs strict blood pressure control and close neurological monitoring. I had a long discussion with the patient the bedside regarding her condition, plan for evaluation, treatment and answered questions. Check CT angiogram of the brain and neck to look for stability of hemorrhage and  and underlying vascular lesions. This patient is critically ill and at significant risk of neurological worsening, death and care requires constant monitoring of vital signs, hemodynamics,respiratory and cardiac monitoring, extensive review of multiple databases, frequent neurological assessment, discussion with family, other specialists and medical decision making of high complexity.I have made any additions or clarifications directly to the above  note.This critical care time does not reflect procedure time, or teaching time or supervisory time of PA/NP/Med Resident etc but could involve care discussion time.  I spent 50 minutes of neurocritical care time  in the care of  this patient.     Delia HeadyPramod Allyiah Gartner, MD Medical Director [facility] Pager: [phone] 07/13/2015 11:25 AM     To contact Stroke Continuity provider, please refer to [email or web address]. After hours, contact General Neurology

## 2015-07-13 NOTE — Consult Note (Signed)
PULMONARY / CRITICAL CARE MEDICINE   Name: Gwendolyn Morales MRN: [id number] DOB: 12-12-1965    ADMISSION DATE:  07/12/2015 CONSULTATION DATE:  07/13/2015  REFERRING MD:  Dr. [name]  CHIEF COMPLAINT:  Fall  HISTORY OF PRESENT ILLNESS:   50 yo female presented to ER after falling.  She had difficulty with speech and Rt sided weakness.  She had BP 280/150.  CT head showed 1.7 x 1.3 cm Lt parietal hemorrhage and 0.6 cm lateral pons hemorrhage.  NIH stroke score 10.  She was started on nicardipine for BP control, and admitted by stroke service.  She was noted to having snoring respiratory pattern, poor gag reflex, and intermittent hypoxia.  PAST MEDICAL HISTORY :  She  has a past medical history of Hypertension.  PAST SURGICAL HISTORY: She has no significant surgical history.  No Known Allergies  No current facility-administered medications on file prior to encounter.   No current outpatient prescriptions on file prior to encounter.    FAMILY HISTORY:  Her family history is negative for scurvy.  SOCIAL HISTORY: She  reports that she has never smoked. She does not have any smokeless tobacco history on file. She reports that she does not drink alcohol or use illicit drugs.  REVIEW OF SYSTEMS:   unable  SUBJECTIVE:  ronchi upper airway sounds, increasing distress and hypoxia  VITAL SIGNS: BP 93/73 mmHg  Pulse 111  Temp(Src) 98.1 F (36.7 C) (Axillary)  Resp 37  Ht 5\' 6"  (1.676 m)  Wt 114.4 kg (252 lb 3.3 oz)  BMI 40.73 kg/m2  SpO2 96%  HEMODYNAMICS:    VENTILATOR SETTINGS:    INTAKE / OUTPUT: I/O last 3 completed shifts: In: 1363.3 [I.V.:1363.3] Out: -   PHYSICAL EXAMINATION: General:  Mild to moderate distress Neuro:  Eyes deviated HEENT: obese, ronchi insp and exp, int poor airmovement also all together Cardiovascular:  s1 s2 RRT no r Lungs:  ronchi Abdomen:  Soft, obese, NT, nD Musculoskeletal:  No sig edema Skin:  No rash  LABS:  BMET  Recent  Labs Lab 07/12/15 2317 07/12/15 2321  NA 139 143  K 2.8* 2.9*  CL 104 102  CO2 27  --   BUN 7 8  CREATININE 0.78 0.70  GLUCOSE 123* 120*    Electrolytes  Recent Labs Lab 07/12/15 2317  CALCIUM 9.1    CBC  Recent Labs Lab 07/12/15 2317 07/12/15 2321  WBC 11.3*  --   HGB 11.4* 13.3  HCT 37.8 39.0  PLT 359  --     Coag's  Recent Labs Lab 07/12/15 2317  APTT 30  INR 1.18    Sepsis Markers No results for input(s): LATICACIDVEN, PROCALCITON, O2SATVEN in the last 168 hours.  ABG No results for input(s): PHART, PCO2ART, PO2ART in the last 168 hours.  Liver Enzymes  Recent Labs Lab 07/12/15 2317  AST 20  ALT 19  ALKPHOS 97  BILITOT 0.4  ALBUMIN 3.8    Cardiac Enzymes No results for input(s): TROPONINI, PROBNP in the last 168 hours.  Glucose  Recent Labs Lab 07/12/15 2330  GLUCAP 116*    Imaging Ct Angio Head W/cm &/or Wo Cm  07/13/2015  CLINICAL DATA:  Hypertensive patient, non compliant, blood pressure 235/122 on admission, recent fall at home, difficulty with speech and RIGHT-sided weakness afterwards. EXAM: CT ANGIOGRAPHY HEAD AND NECK TECHNIQUE: Multidetector CT imaging of the head and neck was performed using the standard protocol during bolus administration of intravenous contrast. Multiplanar CT image reconstructions and  MIPs were obtained to evaluate the vascular anatomy. Carotid stenosis measurements (when applicable) are obtained utilizing NASCET criteria, using the distal internal carotid diameter as the denominator. CONTRAST:  Isovue 370, 50 mL. COMPARISON:  CT head earlier today. FINDINGS: CT HEAD Calvarium and skull base: No fracture or destructive lesion. Mastoids and middle ears are grossly clear. Paranasal sinuses: Imaged portions are clear. Orbits: Negative. Brain: 14 x 18 x 8 mm LEFT frontal opercular hemorrhage, volume slight surrounding edema, similar to priors. 6 mm spherical hemorrhage, LEFT pons, unchanged from priors. No new  areas of hemorrhage since earlier today. Severe hypoattenuation of the white matter, likely chronic microvascular ischemic change. Occipital hypoattenuation does not clearly affect the cortex, although an element of hypertensive encephalopathy is not excluded. CTA NECK Aortic arch: Standard branching. Imaged portion shows no evidence of aneurysm or dissection. No significant stenosis of the major arch vessel origins. Right carotid system: Minor atheromatous change. No evidence of dissection, stenosis (50% or greater) or occlusion. Left carotid system: Minor atheromatous change. Item No evidence of dissection, stenosis (50% or greater) or occlusion. Vertebral arteries: LEFT slightly larger. No evidence of dissection, stenosis (50% or greater) or occlusion. Nonvascular soft tissues:  Unremarkable. CTA HEAD Anterior circulation: Advanced dolichoectasia. Minor cavernous carotid calcification bilaterally. No significant stenosis, proximal occlusion, aneurysm, or vascular malformation. Posterior circulation: Slight distal vertebral calcification on the LEFT, non stenotic. No significant stenosis, proximal occlusion, aneurysm, or vascular malformation. Venous sinuses: As permitted by contrast timing, patent. Anatomic variants: None of significance. Delayed phase:   No abnormal intracranial enhancement. IMPRESSION: Stable acute small volume parenchymal hemorrhages as described in the LEFT pons and LEFT frontal operculum. No underlying extracranial or intracranial stenosis, vascular malformation or aneurysm. No evidence for venous sinus thrombosis. Extensive hypoattenuation of white matter, likely severe chronic microvascular ischemic change related to hypertension. Electronically Signed   By: Elsie Stain M.D.   On: 07/13/2015 13:16   Ct Head Wo Contrast  07/12/2015  CLINICAL DATA:  Code stroke. Right-sided weakness and slurred speech. Initial encounter. EXAM: CT HEAD WITHOUT CONTRAST TECHNIQUE: Contiguous axial images  were obtained from the base of the skull through the vertex without intravenous contrast. COMPARISON:  None. FINDINGS: There is a focus of acute hemorrhage at the left parietal lobe, measuring 1.7 x 1.3 cm. There also appears to be a small focus of acute hemorrhage at the left side of the pons, measuring 0.6 cm. No significant mass effect or midline shift is seen. Scattered periventricular and subcortical white matter change likely reflects small vessel ischemic microangiopathy. Mild chronic ischemic change is noted at the basal ganglia bilaterally. There is no evidence of fracture; visualized osseous structures are unremarkable in appearance. The orbits are within normal limits. The paranasal sinuses and mastoid air cells are well-aerated. No significant soft tissue abnormalities are seen. IMPRESSION: 1. Acute hemorrhage at the left parietal lobe, measuring 1.7 x 1.3 cm. 2. Small focus of acute hemorrhage at the left side of the pons, measuring 0.6 cm. 3. Scattered small vessel ischemic microangiopathy and mild chronic ischemic change at the basal ganglia bilaterally. Critical Value/emergent results were called by telephone at the time of interpretation on 07/12/2015 at 11:33 pm to Dr. Roseanne Reno, who verbally acknowledged these results. Electronically Signed   By: Roanna Raider M.D.   On: 07/12/2015 23:34   Ct Angio Neck W/cm &/or Wo/cm  07/13/2015  CLINICAL DATA:  Hypertensive patient, non compliant, blood pressure 235/122 on admission, recent fall at home, difficulty with speech and  RIGHT-sided weakness afterwards. EXAM: CT ANGIOGRAPHY HEAD AND NECK TECHNIQUE: Multidetector CT imaging of the head and neck was performed using the standard protocol during bolus administration of intravenous contrast. Multiplanar CT image reconstructions and MIPs were obtained to evaluate the vascular anatomy. Carotid stenosis measurements (when applicable) are obtained utilizing NASCET criteria, using the distal internal carotid  diameter as the denominator. CONTRAST:  Isovue 370, 50 mL. COMPARISON:  CT head earlier today. FINDINGS: CT HEAD Calvarium and skull base: No fracture or destructive lesion. Mastoids and middle ears are grossly clear. Paranasal sinuses: Imaged portions are clear. Orbits: Negative. Brain: 14 x 18 x 8 mm LEFT frontal opercular hemorrhage, volume slight surrounding edema, similar to priors. 6 mm spherical hemorrhage, LEFT pons, unchanged from priors. No new areas of hemorrhage since earlier today. Severe hypoattenuation of the white matter, likely chronic microvascular ischemic change. Occipital hypoattenuation does not clearly affect the cortex, although an element of hypertensive encephalopathy is not excluded. CTA NECK Aortic arch: Standard branching. Imaged portion shows no evidence of aneurysm or dissection. No significant stenosis of the major arch vessel origins. Right carotid system: Minor atheromatous change. No evidence of dissection, stenosis (50% or greater) or occlusion. Left carotid system: Minor atheromatous change. Item No evidence of dissection, stenosis (50% or greater) or occlusion. Vertebral arteries: LEFT slightly larger. No evidence of dissection, stenosis (50% or greater) or occlusion. Nonvascular soft tissues:  Unremarkable. CTA HEAD Anterior circulation: Advanced dolichoectasia. Minor cavernous carotid calcification bilaterally. No significant stenosis, proximal occlusion, aneurysm, or vascular malformation. Posterior circulation: Slight distal vertebral calcification on the LEFT, non stenotic. No significant stenosis, proximal occlusion, aneurysm, or vascular malformation. Venous sinuses: As permitted by contrast timing, patent. Anatomic variants: None of significance. Delayed phase:   No abnormal intracranial enhancement. IMPRESSION: Stable acute small volume parenchymal hemorrhages as described in the LEFT pons and LEFT frontal operculum. No underlying extracranial or intracranial stenosis,  vascular malformation or aneurysm. No evidence for venous sinus thrombosis. Extensive hypoattenuation of white matter, likely severe chronic microvascular ischemic change related to hypertension. Electronically Signed   By: Elsie Stain M.D.   On: 07/13/2015 13:16   Dg Chest Port 1 View  07/13/2015  CLINICAL DATA:  Stroke. EXAM: PORTABLE CHEST 1 VIEW COMPARISON:  No prior. FINDINGS: Cardiomegaly with diffuse mild bilateral from interstitial prominence. Congestive heart failure cannot be excluded. Mild pneumonitis cannot be excluded. No pleural effusion or pneumothorax. IMPRESSION: Cardiomegaly with mild diffuse bilateral from interstitial prominence. Mild congestive heart failure cannot be excluded. Mild pneumonitis cannot be excluded. Electronically Signed   By: Maisie Fus  Register   On: 07/13/2015 13:05     STUDIES:  6/01 CT head >> 1.7 x 1.3 cm Lt parietal ICH, 0.6 cm Lt pons ICH, scattered small vessel ischemia 6/03 Echo >>  CULTURES:  ANTIBIOTICS:  SIGNIFICANT EVENTS: 6/01 Admit 6/2- HTN, poor airway control  LINES/TUBES:  DISCUSSION: 50 yo female with Lt parietal and Lt pontine hemorrhage in setting of HTN urgency.  Now with snoring respirations, poor gag reflex, and intermittent hypoxia.  ASSESSMENT / PLAN:  NEUROLOGIC A:   Acute Lt parietal and pontine hemorrhage P:   RASS goal: 0 Needs intubation, then rass -1 neurochecks as able Propofol short acting would favor Imaging per neuro  PULMONARY A: Acute hypoxic respiratory failure Upper airway poor control secretions P:   Low threshold for intubation- will intubate now , she is aspirating and will continued to decline F/u CXR, ABG after ETT placement Avoid acidosis  CARDIOVASCULAR A:  HTN urgency. P:  Nicardipine to keep SBP < 160, re assess after propofol F/u lipid panel Echo awaited  RENAL A:   Hypokalemia. P:   Replace electrolytes as needed Re assess bmet now  Assess mag, phos also Avoid free  water  GASTROINTESTINAL A:   Dysphagia. P:   NPO strict Place OGT and feed after ett  HEMATOLOGIC A:   DVT prevention P:  SCDs for DVT prophylaxis Cbc in am   INFECTIOUS A:   No evidence for infection. P:   Monitor clinically, get pcxr post intubation Assess airway secretions upon intubation  ENDOCRINE A:   Mild hyperglycemia.  P:   Check HbA1C Monitor blood sugar on BMET   FAMILY  - Updates: no family in room  - Inter-disciplinary family meet or Palliative Care meeting due by: 6/9  Ccm time 40 min   Mcarthur Rossettianiel J. Tyson AliasFeinstein, MD, FACP Pgr: [phone] [facility]  Pulmonary and Critical Care Medicine [facility] Pager: [phone]  07/13/2015, 3:47 PM

## 2015-07-13 NOTE — Care Management Note (Signed)
Case Management Note  Patient Details  Name: Gwendolyn SimonSherry Casanas MRN: [id number] Date of Birth: 04-16-1965  Subjective/Objective:   Pt admitted on 07/12/15 s/p hemorrhagic stroke.  PTA, pt independent, lives with spouse.                   Action/Plan: Will follow for discharge planning as pt progresses.  PT/OT consults pending.    Expected Discharge Date:                  Expected Discharge Plan:  IP Rehab Facility  In-House Referral:     Discharge planning Services  CM Consult  Post Acute Care Choice:    Choice offered to:     DME Arranged:    DME Agency:     HH Arranged:    HH Agency:     Status of Service:  In process, will continue to follow  Medicare Important Message Given:    Date Medicare IM Given:    Medicare IM give by:    Date Additional Medicare IM Given:    Additional Medicare Important Message give by:     If discussed at Long Length of Stay Meetings, dates discussed:    Additional Comments:  Quintella BatonJulie W. Rasheka Denard, RN, BSN  Trauma/Neuro ICU Case Manager [phone]

## 2015-07-13 NOTE — ED Notes (Signed)
Pt coming from home. Pt had unwitnessed fall. Pt LSN at 8 pm. Pt did not lose consciousness. Pt husband arrived \\home  at 8 pm and went to the fire department at 2010. FD arived at house at 2015 and states pt had slurred speech.  Upon EMS arrival at 2025 pt had slurred speech and weakness. Pt able to follow commands. Pt hard to understand her speech, pt has severe aphasia and slurred speech. Pt BP 280/160 with EMS. EMS gave 10 labetolol.  Pt BP decreased to 240/140.   CBG 121 96%Ra 88HR 18R   Bilateral 18G in R and L AC

## 2015-07-14 ENCOUNTER — Inpatient Hospital Stay (HOSPITAL_COMMUNITY): Payer: Self-pay

## 2015-07-14 ENCOUNTER — Other Ambulatory Visit (HOSPITAL_COMMUNITY): Payer: Self-pay

## 2015-07-14 DIAGNOSIS — I619 Nontraumatic intracerebral hemorrhage, unspecified: Secondary | ICD-10-CM

## 2015-07-14 LAB — MAGNESIUM
Magnesium: 1.9 mg/dL (ref 1.7–2.4)
Magnesium: 1.9 mg/dL (ref 1.7–2.4)

## 2015-07-14 LAB — CBC
HCT: 36.9 % (ref 36.0–46.0)
Hemoglobin: 10.9 g/dL — ABNORMAL LOW (ref 12.0–15.0)
MCH: 21.9 pg — AB (ref 26.0–34.0)
MCHC: 29.5 g/dL — AB (ref 30.0–36.0)
MCV: 74.1 fL — AB (ref 78.0–100.0)
PLATELETS: 363 10*3/uL (ref 150–400)
RBC: 4.98 MIL/uL (ref 3.87–5.11)
RDW: 20.3 % — ABNORMAL HIGH (ref 11.5–15.5)
WBC: 12.6 10*3/uL — ABNORMAL HIGH (ref 4.0–10.5)

## 2015-07-14 LAB — BASIC METABOLIC PANEL
Anion gap: 10 (ref 5–15)
BUN: 14 mg/dL (ref 6–20)
CHLORIDE: 107 mmol/L (ref 101–111)
CO2: 25 mmol/L (ref 22–32)
CREATININE: 0.92 mg/dL (ref 0.44–1.00)
Calcium: 8.7 mg/dL — ABNORMAL LOW (ref 8.9–10.3)
GFR calc Af Amer: >60 mL/min (ref >60)
GLUCOSE: 112 mg/dL — AB (ref 65–99)
POTASSIUM: 3 mmol/L — AB (ref 3.5–5.1)
SODIUM: 142 mmol/L (ref 135–145)

## 2015-07-14 LAB — HEMOGLOBIN A1C
HEMOGLOBIN A1C: 5.5 % (ref 4.8–5.6)
Mean Plasma Glucose: 111 mg/dL

## 2015-07-14 LAB — GLUCOSE, CAPILLARY
GLUCOSE-CAPILLARY: 100 mg/dL — AB (ref 65–99)
GLUCOSE-CAPILLARY: 118 mg/dL — AB (ref 65–99)
Glucose-Capillary: 97 mg/dL (ref 65–99)
Glucose-Capillary: 101 mg/dL — ABNORMAL HIGH (ref 65–99)
Glucose-Capillary: 102 mg/dL — ABNORMAL HIGH (ref 65–99)
Glucose-Capillary: 97 mg/dL (ref 65–99)

## 2015-07-14 LAB — ECHOCARDIOGRAM COMPLETE
Height: 66 in
WEIGHTICAEL: 4049.41 [oz_av]

## 2015-07-14 LAB — PHOSPHORUS
PHOSPHORUS: 2.6 mg/dL (ref 2.5–4.6)
Phosphorus: 3.2 mg/dL (ref 2.5–4.6)

## 2015-07-14 MED ORDER — VITAL HIGH PROTEIN PO LIQD
1000.0000 mL | ORAL | Status: DC
  Administered 2015-07-15: 09:00:00
  Administered 2015-07-15: 1000 mL
  Administered 2015-07-15 – 2015-07-17 (×3)

## 2015-07-14 MED ORDER — HYDROCHLOROTHIAZIDE 10 MG/ML ORAL SUSPENSION
25.0000 mg | Freq: Every day | ORAL | Status: DC
  Administered 2015-07-14 – 2015-07-24 (×11): 25 mg
  Filled 2015-07-14 (×11): qty 2.5

## 2015-07-14 MED ORDER — PRO-STAT SUGAR FREE PO LIQD
60.0000 mL | Freq: Three times a day (TID) | ORAL | Status: DC
  Administered 2015-07-14 – 2015-07-20 (×18): 60 mL
  Filled 2015-07-14 (×18): qty 60

## 2015-07-14 MED ORDER — AMLODIPINE 1 MG/ML ORAL SUSPENSION
5.0000 mg | Freq: Every day | ORAL | Status: DC
  Filled 2015-07-14 (×2): qty 5

## 2015-07-14 MED ORDER — POTASSIUM CHLORIDE 20 MEQ/15ML (10%) PO SOLN
30.0000 meq | ORAL | Status: AC
  Administered 2015-07-14 (×2): 30 meq
  Filled 2015-07-14 (×2): qty 30

## 2015-07-14 MED ORDER — AMLODIPINE BESYLATE 5 MG PO TABS
5.0000 mg | ORAL_TABLET | Freq: Every day | ORAL | Status: DC
  Administered 2015-07-14 – 2015-07-15 (×2): 5 mg
  Filled 2015-07-14 (×2): qty 1

## 2015-07-14 NOTE — Progress Notes (Addendum)
Speech Language Pathology Discharge Patient Details Name: Gwendolyn SimonSherry Morales MRN: [id number] DOB: Jun 12, 1965 Today's Date: 07/14/2015 Time:  -     Patient discharged from SLP services secondary to medical decline - will need to re-order SLP to resume therapy services. Intubated.   Please see latest therapy progress note for current level of functioning and progress toward goals.    Progress and discharge plan discussed with patient and/or caregiver: Patient unable to participate in discharge planning and no caregivers available  GO     Royce MacadamiaLitaker, Madalyn Legner Willis 07/14/2015, 9:07 AM   Breck CoonsLisa Willis Lonell FaceLitaker M.Ed [employer] Pager [phone]

## 2015-07-14 NOTE — Progress Notes (Signed)
Initial Nutrition Assessment  DOCUMENTATION CODES:  Morbid obesity  INTERVENTION:  Formula appropriate. Due to High rate of propofol, decrease TF to a goal rate of 15 ml/h (360 ml per day) and Prostat 60 ml TID to provide 960 kcals (+ 544 from sedation) , 122 gm protein, 301 ml free water daily.  NUTRITION DIAGNOSIS:  Inadequate oral intake related to inability to eat as evidenced by NPO status.  GOAL:  Provide needs based on ASPEN/SCCM guidelines  MONITOR:  Vent status, Labs, Weight trends, I & O's, TF tolerance  REASON FOR ASSESSMENT:  Consult Enteral/tube feeding initiation and management  ASSESSMENT:  50 y/o female PHMx HTN presented after falling at home and afterwards haiving difficulty with speech+ R side weajnes. CT showed intraparenchymal hemorrhages. Intubated 6/2 due to poor airway protection. Nutrition consulted for TF.   Pt has VHP running @ 40 ml/hr.   NS @ 75 ml= 1.8 liters fluid  Abdomen soft non distended. No edema. NFPE: WDL  Patient is currently intubated on ventilator support MV: 13.3 L/min Temp (24hrs), Avg:99.4 F (37.4 C), Min:98.1 F (36.7 C), Max:100.4 F (38 C)  Propofol rate-stable. :20.6 ml/hr =544 kcal/day  Labs reviewed: MAP: 107, WBC: 12.6, TG 138   Recent Labs Lab 07/12/15 2317 07/12/15 2321 07/13/15 1846 07/14/15 0355  NA 139 143 142 142  K 2.8* 2.9* 3.4* 3.0*  CL 104 102 109 107  CO2 27  --  24 25  BUN 7 8 12 14   CREATININE 0.78 0.70 0.91 0.92  CALCIUM 9.1  --  8.5* 8.7*  MG  --   --  1.7 1.9  PHOS  --   --  4.5 3.2  GLUCOSE 123* 120* 122* 112*   Diet Order:     Skin:  Reviewed, no issues  Last BM:  Unknown  Height:  Ht Readings from Last 1 Encounters:  07/13/15 5\' 6"  (1.676 m)   Weight:  Wt Readings from Last 1 Encounters:  07/14/15 253 lb 1.4 oz (114.8 kg)  Dosing weight: 252 lbs  Ideal Body Weight:  59.1 kg  BMI:  Body mass index is 40.87 kg/(m^2).  Estimated Nutritional Needs:  Kcal:  1254-1596 (11-14  kcal/kg ABW) Protein:  >118-140 g (2g/kw ibw Fluid:  Per MD  EDUCATION NEEDS:  No education needs identified at this time  Christophe LouisNathan Franks RD, LDN, CNSC Clinical Nutrition Pager: [phone] 07/14/2015 9:47 AM

## 2015-07-14 NOTE — Progress Notes (Signed)
[facility] FOR AM LAB REPLACEMENT ONLY  The patient does [facility] based on the criteria listed below:   1. Is GFR >/= 40 ml/min? Yes.    Patient's GFR today is >60 2. Is urine output >/= 0.5 ml/kg/hr for the last 6 hours? Yes.   Patient's UOP is 0.5 ml/kg/hr 3. Is BUN < 60 mg/dL? Yes.    Patient's BUN today is 14 4. Abnormal electrolyte(s): K 3.0 5. Ordered repletion with: per protocol 6. If a panic level lab has been reported, has the CCM MD in charge been notified? No..   Physician:    Markus DaftWHELAN, Charnelle Bergeman A 07/14/2015 5:47 AM

## 2015-07-14 NOTE — Progress Notes (Signed)
STROKE TEAM PROGRESS NOTE   HISTORY OF PRESENT ILLNESS (per record) Gwendolyn Morales is an 50 y.o. female with a history of hypertension and noncompliance with treatment brought to the ED and code stroke status following acute onset of a physical fall at home and afterwards having difficulty with speech output as well as right-sided weakness. She has no previous history of stroke nor TIA. She has not been on antiplatelet therapy. Blood pressure was markedly elevated at 280/150, per EMS. Blood pressure remained elevated at 235/122 after 10 mg of labetalol 2. Cardene drip was started. CT scan of her head showed a 1.7 x 1.3 cm acute left parietal hemorrhage, as well as a 0.6 cm hemorrhage involving the left lateral pons. NIH stroke score was 10. ICH score 3. She was LKW at 8:15 PM on 07/12/2015. Patient was not administered IV t-PA secondary to ICH. She was admitted to the neuro ICU for further evaluation and treatment.   SUBJECTIVE (INTERVAL HISTORY) She developed respiratory distress yesterday afternoon and required emergent intubation. She had some edema of the vocal cords as per CCM M.D . She has remained  stable overnight and blood pressure has been controlled on Cardene drip. No family at the bedside. Follow-up CT scan from this morning shows stable appearance of both left parietal and pontine hemorrhages without significant cytotoxic edema, midline shift, mass effect   OBJECTIVE Temp:  [98.1 F (36.7 C)-100.4 F (38 C)] 99.7 F (37.6 C) (06/03 1200) Pulse Rate:  [83-114] 91 (06/03 1225) Cardiac Rhythm:  [-] Normal sinus rhythm (06/03 0800) Resp:  [16-37] 19 (06/03 1225) BP: (93-237)/(63-225) 124/78 mmHg (06/03 1225) SpO2:  [93 %-100 %] 100 % (06/03 1225) FiO2 (%):  [40 %-60 %] 40 % (06/03 1225) Weight:  [253 lb 1.4 oz (114.8 kg)] 253 lb 1.4 oz (114.8 kg) (06/03 0243)  CBC:   Recent Labs Lab 07/12/15 2317 07/12/15 2321 07/14/15 0355  WBC 11.3*  --  12.6*  NEUTROABS 9.2*  --   --    HGB 11.4* 13.3 10.9*  HCT 37.8 39.0 36.9  MCV 72.1*  --  74.1*  PLT 359  --  363    Basic Metabolic Panel:   Recent Labs Lab 07/13/15 1846 07/14/15 0355  NA 142 142  K 3.4* 3.0*  CL 109 107  CO2 24 25  GLUCOSE 122* 112*  BUN 12 14  CREATININE 0.91 0.92  CALCIUM 8.5* 8.7*  MG 1.7 1.9  PHOS 4.5 3.2    Lipid Panel:     Component Value Date/Time   CHOL 187 07/13/2015 1057   TRIG 138 07/13/2015 1846   HDL 60 07/13/2015 1057   CHOLHDL 3.1 07/13/2015 1057   VLDL 20 07/13/2015 1057   LDLCALC 107* 07/13/2015 1057   HgbA1c:  Lab Results  Component Value Date   HGBA1C 5.5 07/13/2015   Urine Drug Screen: No results found for: LABOPIA, COCAINSCRNUR, LABBENZ, AMPHETMU, THCU, LABBARB    IMAGING  Ct Angio Head W/cm &/or Wo Cm  07/13/2015  CLINICAL DATA:  Hypertensive patient, non compliant, blood pressure 235/122 on admission, recent fall at home, difficulty with speech and RIGHT-sided weakness afterwards. EXAM: CT ANGIOGRAPHY HEAD AND NECK TECHNIQUE: Multidetector CT imaging of the head and neck was performed using the standard protocol during bolus administration of intravenous contrast. Multiplanar CT image reconstructions and MIPs were obtained to evaluate the vascular anatomy. Carotid stenosis measurements (when applicable) are obtained utilizing NASCET criteria, using the distal internal carotid diameter as the denominator. CONTRAST:  Isovue 370, 50 mL. COMPARISON:  CT head earlier today. FINDINGS: CT HEAD Calvarium and skull base: No fracture or destructive lesion. Mastoids and middle ears are grossly clear. Paranasal sinuses: Imaged portions are clear. Orbits: Negative. Brain: 14 x 18 x 8 mm LEFT frontal opercular hemorrhage, volume slight surrounding edema, similar to priors. 6 mm spherical hemorrhage, LEFT pons, unchanged from priors. No new areas of hemorrhage since earlier today. Severe hypoattenuation of the white matter, likely chronic microvascular ischemic change.  Occipital hypoattenuation does not clearly affect the cortex, although an element of hypertensive encephalopathy is not excluded. CTA NECK Aortic arch: Standard branching. Imaged portion shows no evidence of aneurysm or dissection. No significant stenosis of the major arch vessel origins. Right carotid system: Minor atheromatous change. No evidence of dissection, stenosis (50% or greater) or occlusion. Left carotid system: Minor atheromatous change. Item No evidence of dissection, stenosis (50% or greater) or occlusion. Vertebral arteries: LEFT slightly larger. No evidence of dissection, stenosis (50% or greater) or occlusion. Nonvascular soft tissues:  Unremarkable. CTA HEAD Anterior circulation: Advanced dolichoectasia. Minor cavernous carotid calcification bilaterally. No significant stenosis, proximal occlusion, aneurysm, or vascular malformation. Posterior circulation: Slight distal vertebral calcification on the LEFT, non stenotic. No significant stenosis, proximal occlusion, aneurysm, or vascular malformation. Venous sinuses: As permitted by contrast timing, patent. Anatomic variants: None of significance. Delayed phase:   No abnormal intracranial enhancement. IMPRESSION: Stable acute small volume parenchymal hemorrhages as described in the LEFT pons and LEFT frontal operculum. No underlying extracranial or intracranial stenosis, vascular malformation or aneurysm. No evidence for venous sinus thrombosis. Extensive hypoattenuation of white matter, likely severe chronic microvascular ischemic change related to hypertension. Electronically Signed   By: Elsie Stain M.D.   On: 07/13/2015 13:16   Ct Head Wo Contrast  07/14/2015  CLINICAL DATA:  Follow-up exam for acute intracranial hemorrhage. EXAM: CT HEAD WITHOUT CONTRAST TECHNIQUE: Contiguous axial images were obtained from the base of the skull through the vertex without intravenous contrast. COMPARISON:  Prior study from 07/13/2015. FINDINGS: Patient is  intubated with partial visualization of endotracheal and enteric tubes. Paranasal sinuses are largely clear. No mastoid effusion. Middle ear cavities are clear. Calvarium intact. Chronic microvascular ischemic disease again noted, stable. Left frontal operculum air hemorrhage measures 16 x 16 x 10 mm on today's study. Allowing for differences in technique, this is overall little interval changed. Mild localized surrounding edema, stable. Additional spherical hemorrhage at the left pons also little interval changed measuring 6 mm. No significant edema or mass effect. No new hemorrhage. No large vessel territory infarct. No midline shift or hydrocephalus. No extra-axial fluid collection. Scalp soft tissues within normal limits. No acute abnormality about the orbits. IMPRESSION: 1. Overall no significant interval change in left frontal opercular hemorrhage with mild localized edema. 2. Stable 6 mm left pontine hemorrhage without significant edema. 3. No other new acute intracranial process. Electronically Signed   By: Rise Mu M.D.   On: 07/14/2015 02:18   Ct Head Wo Contrast  07/12/2015  CLINICAL DATA:  Code stroke. Right-sided weakness and slurred speech. Initial encounter. EXAM: CT HEAD WITHOUT CONTRAST TECHNIQUE: Contiguous axial images were obtained from the base of the skull through the vertex without intravenous contrast. COMPARISON:  None. FINDINGS: There is a focus of acute hemorrhage at the left parietal lobe, measuring 1.7 x 1.3 cm. There also appears to be a small focus of acute hemorrhage at the left side of the pons, measuring 0.6 cm. No significant mass effect or  midline shift is seen. Scattered periventricular and subcortical white matter change likely reflects small vessel ischemic microangiopathy. Mild chronic ischemic change is noted at the basal ganglia bilaterally. There is no evidence of fracture; visualized osseous structures are unremarkable in appearance. The orbits are within  normal limits. The paranasal sinuses and mastoid air cells are well-aerated. No significant soft tissue abnormalities are seen. IMPRESSION: 1. Acute hemorrhage at the left parietal lobe, measuring 1.7 x 1.3 cm. 2. Small focus of acute hemorrhage at the left side of the pons, measuring 0.6 cm. 3. Scattered small vessel ischemic microangiopathy and mild chronic ischemic change at the basal ganglia bilaterally. Critical Value/emergent results were called by telephone at the time of interpretation on 07/12/2015 at 11:33 pm to Dr. Roseanne Reno, who verbally acknowledged these results. Electronically Signed   By: Roanna Raider M.D.   On: 07/12/2015 23:34   Ct Angio Neck W/cm &/or Wo/cm  07/13/2015  CLINICAL DATA:  Hypertensive patient, non compliant, blood pressure 235/122 on admission, recent fall at home, difficulty with speech and RIGHT-sided weakness afterwards. EXAM: CT ANGIOGRAPHY HEAD AND NECK TECHNIQUE: Multidetector CT imaging of the head and neck was performed using the standard protocol during bolus administration of intravenous contrast. Multiplanar CT image reconstructions and MIPs were obtained to evaluate the vascular anatomy. Carotid stenosis measurements (when applicable) are obtained utilizing NASCET criteria, using the distal internal carotid diameter as the denominator. CONTRAST:  Isovue 370, 50 mL. COMPARISON:  CT head earlier today. FINDINGS: CT HEAD Calvarium and skull base: No fracture or destructive lesion. Mastoids and middle ears are grossly clear. Paranasal sinuses: Imaged portions are clear. Orbits: Negative. Brain: 14 x 18 x 8 mm LEFT frontal opercular hemorrhage, volume slight surrounding edema, similar to priors. 6 mm spherical hemorrhage, LEFT pons, unchanged from priors. No new areas of hemorrhage since earlier today. Severe hypoattenuation of the white matter, likely chronic microvascular ischemic change. Occipital hypoattenuation does not clearly affect the cortex, although an element of  hypertensive encephalopathy is not excluded. CTA NECK Aortic arch: Standard branching. Imaged portion shows no evidence of aneurysm or dissection. No significant stenosis of the major arch vessel origins. Right carotid system: Minor atheromatous change. No evidence of dissection, stenosis (50% or greater) or occlusion. Left carotid system: Minor atheromatous change. Item No evidence of dissection, stenosis (50% or greater) or occlusion. Vertebral arteries: LEFT slightly larger. No evidence of dissection, stenosis (50% or greater) or occlusion. Nonvascular soft tissues:  Unremarkable. CTA HEAD Anterior circulation: Advanced dolichoectasia. Minor cavernous carotid calcification bilaterally. No significant stenosis, proximal occlusion, aneurysm, or vascular malformation. Posterior circulation: Slight distal vertebral calcification on the LEFT, non stenotic. No significant stenosis, proximal occlusion, aneurysm, or vascular malformation. Venous sinuses: As permitted by contrast timing, patent. Anatomic variants: None of significance. Delayed phase:   No abnormal intracranial enhancement. IMPRESSION: Stable acute small volume parenchymal hemorrhages as described in the LEFT pons and LEFT frontal operculum. No underlying extracranial or intracranial stenosis, vascular malformation or aneurysm. No evidence for venous sinus thrombosis. Extensive hypoattenuation of white matter, likely severe chronic microvascular ischemic change related to hypertension. Electronically Signed   By: Elsie Stain M.D.   On: 07/13/2015 13:16   Dg Chest Port 1 View  07/14/2015  CLINICAL DATA:  Cerebral infarction. EXAM: PORTABLE CHEST 1 VIEW COMPARISON:  07/13/2015 FINDINGS: Enteric tube scratched it endotracheal tube terminates approximately 3.5 cm above the carina. Enteric tube courses into the left upper abdomen with tip not imaged. Prominent with of the superior mediastinum is  unchanged and may be secondary to vascular structures and low  lung volumes, without abnormality identified in this region on the recent neck CTA. Patchy left greater than right basilar lung opacities have not significantly changed and likely reflect atelectasis. No sizable pleural effusion or pneumothorax is identified. IMPRESSION: Unchanged bibasilar atelectasis. Electronically Signed   By: Sebastian Ache M.D.   On: 07/14/2015 07:55   Dg Chest Port 1 View  07/13/2015  CLINICAL DATA:  ET tube placement EXAM: PORTABLE CHEST 1 VIEW COMPARISON:  07/13/2015 FINDINGS: Endotracheal tube is 3.5 cm above the carina. Patchy bilateral lower lobe airspace opacities, likely atelectasis. Low lung volumes. Heart is borderline in size. No effusions. IMPRESSION: Endotracheal tube 3.5 cm above the carina. Low lung volumes with bibasilar atelectasis. Electronically Signed   By: Charlett Nose M.D.   On: 07/13/2015 18:07   Dg Chest Port 1 View  07/13/2015  CLINICAL DATA:  Stroke. EXAM: PORTABLE CHEST 1 VIEW COMPARISON:  No prior. FINDINGS: Cardiomegaly with diffuse mild bilateral from interstitial prominence. Congestive heart failure cannot be excluded. Mild pneumonitis cannot be excluded. No pleural effusion or pneumothorax. IMPRESSION: Cardiomegaly with mild diffuse bilateral from interstitial prominence. Mild congestive heart failure cannot be excluded. Mild pneumonitis cannot be excluded. Electronically Signed   By: Maisie Fus  Register   On: 07/13/2015 13:05   Dg Abd Portable 1v  07/13/2015  CLINICAL DATA:  Encounter for OG tube EXAM: PORTABLE ABDOMEN - 1 VIEW COMPARISON:  None. FINDINGS: NG tube tip is in the mid stomach. Nonobstructive bowel gas pattern. IMPRESSION: NG tube tip in the mid stomach. Electronically Signed   By: Charlett Nose M.D.   On: 07/13/2015 18:07    PHYSICAL EXAM Obese middle aged Caucasian lady who is intubated and sedated on a ventilator. . Afebrile. Head is nontraumatic. Neck is supple without bruit.    Cardiac exam no murmur or gallop. Lungs are clear to  auscultation. Distal pulses are well felt. Neurological Exam :  Patient is sedated and intubated. Pupils right 3 mm left 5 mm both reactive. Fundi were not visualized. Dolls eye movements are full range. No nystagmus. No facial weakness. Blinks to threat bilaterally. Spontaneously moves left side greater than right side. Mild right upper and lower extremity weakness. Right plantar upgoing left downgoing. Gait was not tested.  NIH stroke scale 6. ICH scale 3. ASSESSMENT/PLAN Gwendolyn Morales is a 50 y.o. female with history of HTN and noncompliance presenting with speech difficulty and R sided weakness following a fall. BP elevated. 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  CTA head  ordered  CTA neck  ordered  Carotid Doppler  canceled  2D Echo  pending   SCDs for VTE prophylaxis    No antithrombotic prior to admission  Ongoing aggressive stroke risk factor management  Therapy recommendations:  pending   Disposition:  pending   Hypertensive Emergency  BP 280/150 on arrival to hospital in setting of neuro symptoms  Started on cardene  Stable on cardene this am  SBP goal < 160  Other Stroke Risk Factors  Morbid Obesity, Body mass index is 40.87 kg/(m^2).   Other Active Problems  Leukocytosis, WBC 11.3, Check labs am  Hypokalemia, K 2.9, replaced with 4 runs. Check  labs am  Hospital day # 2  I have personally examined this patient, reviewed notes, independently viewed imaging studies, participated in medical decision making and plan of care. I have made any additions or clarifications directly to the above note. 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 She remains at risk for neurological worsening, hematoma expansion, cytotoxic edema, hydrocephalus and needs strict blood pressure control and close neurological monitoring.  family not available at the bedside at the present time for discussion This patient is critically ill and at significant risk of neurological worsening, death and care requires constant monitoring of vital signs, hemodynamics,respiratory and cardiac monitoring, extensive review of multiple databases, frequent neurological assessment, discussion with family, other specialists and medical decision making of high complexity.I have made any additions or clarifications directly to the above note.This critical care time does not reflect procedure time, or teaching time or supervisory time of PA/NP/Med Resident etc but could involve care discussion time.  I spent 35 minutes of neurocritical care time  in the care of  this patient.     Delia Heady, MD Medical Director [facility] Pager: [phone] 07/14/2015 11:25 AM     To contact Stroke Continuity provider, please refer to [email or web address]. After hours, contact General Neurology

## 2015-07-14 NOTE — Progress Notes (Signed)
  Echocardiogram 2D Echocardiogram has been performed.  Gwendolyn Morales 07/14/2015, 10:01 AM

## 2015-07-14 NOTE — Progress Notes (Signed)
PT Cancellation Note  Patient Details Name: Gwendolyn Morales MRN: [id number] DOB: Sep 10, 1965   Cancelled Treatment:    Reason Eval/Treat Not Completed: Medical issues which prohibited therapy.  Pt intubated yesterday evening due to poor airway protection.  Pt to f/u Monday to see if pt more medically ready for assessment.   Thanks,    Rollene Rotundaebecca B. Locke Barrell, PT, DPT [phone]   07/14/2015, 9:20 AM

## 2015-07-14 NOTE — Progress Notes (Signed)
PULMONARY / CRITICAL CARE MEDICINE   Name: Gwendolyn Morales MRN: [id number] DOB: 1965-05-27    ADMISSION DATE:  07/12/2015 CONSULTATION DATE:  07/13/2015  REFERRING MD:  Dr. [name]  CHIEF COMPLAINT:  Fall  HISTORY OF PRESENT ILLNESS:   50 yo female presented to ER after falling.  She had difficulty with speech and Rt sided weakness.  She had BP 280/150.  CT head showed 1.7 x 1.3 cm Lt parietal hemorrhage and 0.6 cm lateral pons hemorrhage.  NIH stroke score 10.  She was started on nicardipine for BP control, and admitted by stroke service.  She was noted to having snoring respiratory pattern, poor gag reflex, and intermittent hypoxia. Required intubation.    SUBJECTIVE:  Sedated on vent  VITAL SIGNS: BP 221/189 mmHg  Pulse 109  Temp(Src) 100.4 F (38 C) (Axillary)  Resp 29  Ht  (1.676 m)  Wt 253 lb 1.4 oz (114.8 kg)  BMI 40.87 kg/m2  SpO2 99%  HEMODYNAMICS:    VENTILATOR SETTINGS: Vent Mode:  [-] PRVC FiO2 (%):  [40 %-60 %] 40 % Set Rate:  [14 bmp] 14 bmp Vt Set:  [470 mL] 470 mL PEEP:  [5 cmH20] 5 cmH20 Plateau Pressure:  [8 cmH20-19 cmH20] 16 cmH20  INTAKE / OUTPUT: I/O last 3 completed shifts: In: 5947.2 [I.V.:5547.2; NG/GT:400] Out: 955 [Urine:955]  PHYSICAL EXAMINATION: General:  Sedated on vent Neuro:  Eyes deviated, extends upper ext to noxious stimuli, wd lower exr HEENT: No JVD, ETT-> vent, pupils pinpoint Cardiovascular:  s1 s2 RRT no r Lungs:  Ronchi, decreased in bases Abdomen:  Soft, obese, NT,  Musculoskeletal:  No sig edema Skin:  No rash  LABS:  BMET  Recent Labs Lab 07/12/15 2317 07/12/15 2321 07/13/15 1846 07/14/15 0355  NA 139 143 142 142  K 2.8* 2.9* 3.4* 3.0*  CL 104 102 109 107  CO2 27  --  24 25  BUN CREATININE 0.78 0.70 0.91 0.92  GLUCOSE 123* 120* 122* 112*    Electrolytes  Recent Labs Lab 07/12/15 2317 07/13/15 1846 07/14/15 0355  CALCIUM 9.1 8.5* 8.7*  MG  --  1.7 1.9  PHOS  --  4.5 3.2     CBC  Recent Labs Lab 07/12/15 2317 07/12/15 2321 07/14/15 0355  WBC 11.3*  --  12.6*  HGB 11.4* 13.3 10.9*  HCT 37.8 39.0 36.9  PLT 359  --  363    Coag's  Recent Labs Lab 07/12/15 2317  APTT 30  INR 1.18    Sepsis Markers No results for input(s): LATICACIDVEN, PROCALCITON, O2SATVEN in the last 168 hours.  ABG  Recent Labs Lab 07/13/15 1901  PHART 7.349*  PCO2ART 45.1*  PO2ART 63.0*    Liver Enzymes  Recent Labs Lab 07/12/15 2317  AST 20  ALT 19  ALKPHOS 97  BILITOT 0.4  ALBUMIN 3.8    Cardiac Enzymes No results for input(s): TROPONINI, PROBNP in the last 168 hours.  Glucose  Recent Labs Lab 07/12/15 2330 07/13/15 1928 07/13/15 2344 07/14/15 0433 07/14/15 0723  GLUCAP 116* 122* 118* 97 101*    Imaging Ct Angio Head W/cm &/or Wo Cm  07/13/2015  CLINICAL DATA:  Hypertensive patient, non compliant, blood pressure 235/122 on admission, recent fall at home, difficulty with speech and RIGHT-sided weakness afterwards. EXAM: CT ANGIOGRAPHY HEAD AND NECK TECHNIQUE: Multidetector CT imaging of the head and neck was performed using the standard protocol during bolus administration of intravenous contrast. Multiplanar CT  image reconstructions and MIPs were obtained to evaluate the vascular anatomy. Carotid stenosis measurements (when applicable) are obtained utilizing NASCET criteria, using the distal internal carotid diameter as the denominator. CONTRAST:  Isovue 370, 50 mL. COMPARISON:  CT head earlier today. FINDINGS: CT HEAD Calvarium and skull base: No fracture or destructive lesion. Mastoids and middle ears are grossly clear. Paranasal sinuses: Imaged portions are clear. Orbits: Negative. Brain: 14 x 18 x 8 mm LEFT frontal opercular hemorrhage, volume slight surrounding edema, similar to priors. 6 mm spherical hemorrhage, LEFT pons, unchanged from priors. No new areas of hemorrhage since earlier today. Severe hypoattenuation of the white matter, likely  chronic microvascular ischemic change. Occipital hypoattenuation does not clearly affect the cortex, although an element of hypertensive encephalopathy is not excluded. CTA NECK Aortic arch: Standard branching. Imaged portion shows no evidence of aneurysm or dissection. No significant stenosis of the major arch vessel origins. Right carotid system: Minor atheromatous change. No evidence of dissection, stenosis (50% or greater) or occlusion. Left carotid system: Minor atheromatous change. Item No evidence of dissection, stenosis (50% or greater) or occlusion. Vertebral arteries: LEFT slightly larger. No evidence of dissection, stenosis (50% or greater) or occlusion. Nonvascular soft tissues:  Unremarkable. CTA HEAD Anterior circulation: Advanced dolichoectasia. Minor cavernous carotid calcification bilaterally. No significant stenosis, proximal occlusion, aneurysm, or vascular malformation. Posterior circulation: Slight distal vertebral calcification on the LEFT, non stenotic. No significant stenosis, proximal occlusion, aneurysm, or vascular malformation. Venous sinuses: As permitted by contrast timing, patent. Anatomic variants: None of significance. Delayed phase:   No abnormal intracranial enhancement. IMPRESSION: Stable acute small volume parenchymal hemorrhages as described in the LEFT pons and LEFT frontal operculum. No underlying extracranial or intracranial stenosis, vascular malformation or aneurysm. No evidence for venous sinus thrombosis. Extensive hypoattenuation of white matter, likely severe chronic microvascular ischemic change related to hypertension. Electronically Signed   By: Elsie StainJohn T Curnes M.D.   On: 07/13/2015 13:16   Ct Head Wo Contrast  07/14/2015  CLINICAL DATA:  Follow-up exam for acute intracranial hemorrhage. EXAM: CT HEAD WITHOUT CONTRAST TECHNIQUE: Contiguous axial images were obtained from the base of the skull through the vertex without intravenous contrast. COMPARISON:  Prior study  from 07/13/2015. FINDINGS: Patient is intubated with partial visualization of endotracheal and enteric tubes. Paranasal sinuses are largely clear. No mastoid effusion. Middle ear cavities are clear. Calvarium intact. Chronic microvascular ischemic disease again noted, stable. Left frontal operculum air hemorrhage measures 16 x 16 x 10 mm on today's study. Allowing for differences in technique, this is overall little interval changed. Mild localized surrounding edema, stable. Additional spherical hemorrhage at the left pons also little interval changed measuring 6 mm. No significant edema or mass effect. No new hemorrhage. No large vessel territory infarct. No midline shift or hydrocephalus. No extra-axial fluid collection. Scalp soft tissues within normal limits. No acute abnormality about the orbits. IMPRESSION: 1. Overall no significant interval change in left frontal opercular hemorrhage with mild localized edema. 2. Stable 6 mm left pontine hemorrhage without significant edema. 3. No other new acute intracranial process. Electronically Signed   By: Rise MuBenjamin  McClintock M.D.   On: 07/14/2015 02:18   Ct Angio Neck W/cm &/or Wo/cm  07/13/2015  CLINICAL DATA:  Hypertensive patient, non compliant, blood pressure 235/122 on admission, recent fall at home, difficulty with speech and RIGHT-sided weakness afterwards. EXAM: CT ANGIOGRAPHY HEAD AND NECK TECHNIQUE: Multidetector CT imaging of the head and neck was performed using the standard protocol during bolus administration of  intravenous contrast. Multiplanar CT image reconstructions and MIPs were obtained to evaluate the vascular anatomy. Carotid stenosis measurements (when applicable) are obtained utilizing NASCET criteria, using the distal internal carotid diameter as the denominator. CONTRAST:  Isovue 370, 50 mL. COMPARISON:  CT head earlier today. FINDINGS: CT HEAD Calvarium and skull base: No fracture or destructive lesion. Mastoids and middle ears are grossly  clear. Paranasal sinuses: Imaged portions are clear. Orbits: Negative. Brain: 14 x 18 x 8 mm LEFT frontal opercular hemorrhage, volume slight surrounding edema, similar to priors. 6 mm spherical hemorrhage, LEFT pons, unchanged from priors. No new areas of hemorrhage since earlier today. Severe hypoattenuation of the white matter, likely chronic microvascular ischemic change. Occipital hypoattenuation does not clearly affect the cortex, although an element of hypertensive encephalopathy is not excluded. CTA NECK Aortic arch: Standard branching. Imaged portion shows no evidence of aneurysm or dissection. No significant stenosis of the major arch vessel origins. Right carotid system: Minor atheromatous change. No evidence of dissection, stenosis (50% or greater) or occlusion. Left carotid system: Minor atheromatous change. Item No evidence of dissection, stenosis (50% or greater) or occlusion. Vertebral arteries: LEFT slightly larger. No evidence of dissection, stenosis (50% or greater) or occlusion. Nonvascular soft tissues:  Unremarkable. CTA HEAD Anterior circulation: Advanced dolichoectasia. Minor cavernous carotid calcification bilaterally. No significant stenosis, proximal occlusion, aneurysm, or vascular malformation. Posterior circulation: Slight distal vertebral calcification on the LEFT, non stenotic. No significant stenosis, proximal occlusion, aneurysm, or vascular malformation. Venous sinuses: As permitted by contrast timing, patent. Anatomic variants: None of significance. Delayed phase:   No abnormal intracranial enhancement. IMPRESSION: Stable acute small volume parenchymal hemorrhages as described in the LEFT pons and LEFT frontal operculum. No underlying extracranial or intracranial stenosis, vascular malformation or aneurysm. No evidence for venous sinus thrombosis. Extensive hypoattenuation of white matter, likely severe chronic microvascular ischemic change related to hypertension. Electronically  Signed   By: Elsie Stain M.D.   On: 07/13/2015 13:16   Dg Chest Port 1 View  07/14/2015  CLINICAL DATA:  Cerebral infarction. EXAM: PORTABLE CHEST 1 VIEW COMPARISON:  07/13/2015 FINDINGS: Enteric tube scratched it endotracheal tube terminates approximately 3.5 cm above the carina. Enteric tube courses into the left upper abdomen with tip not imaged. Prominent with of the superior mediastinum is unchanged and may be secondary to vascular structures and low lung volumes, without abnormality identified in this region on the recent neck CTA. Patchy left greater than right basilar lung opacities have not significantly changed and likely reflect atelectasis. No sizable pleural effusion or pneumothorax is identified. IMPRESSION: Unchanged bibasilar atelectasis. Electronically Signed   By: Sebastian Ache M.D.   On: 07/14/2015 07:55   Dg Chest Port 1 View  07/13/2015  CLINICAL DATA:  ET tube placement EXAM: PORTABLE CHEST 1 VIEW COMPARISON:  07/13/2015 FINDINGS: Endotracheal tube is 3.5 cm above the carina. Patchy bilateral lower lobe airspace opacities, likely atelectasis. Low lung volumes. Heart is borderline in size. No effusions. IMPRESSION: Endotracheal tube 3.5 cm above the carina. Low lung volumes with bibasilar atelectasis. Electronically Signed   By: Charlett Nose M.D.   On: 07/13/2015 18:07   Dg Chest Port 1 View  07/13/2015  CLINICAL DATA:  Stroke. EXAM: PORTABLE CHEST 1 VIEW COMPARISON:  No prior. FINDINGS: Cardiomegaly with diffuse mild bilateral from interstitial prominence. Congestive heart failure cannot be excluded. Mild pneumonitis cannot be excluded. No pleural effusion or pneumothorax. IMPRESSION: Cardiomegaly with mild diffuse bilateral from interstitial prominence. Mild congestive heart failure cannot be  excluded. Mild pneumonitis cannot be excluded. Electronically Signed   By: Maisie Fus  Register   On: 07/13/2015 13:05   Dg Abd Portable 1v  07/13/2015  CLINICAL DATA:  Encounter for OG tube EXAM:  PORTABLE ABDOMEN - 1 VIEW COMPARISON:  None. FINDINGS: NG tube tip is in the mid stomach. Nonobstructive bowel gas pattern. IMPRESSION: NG tube tip in the mid stomach. Electronically Signed   By: Charlett Nose M.D.   On: 07/13/2015 18:07     STUDIES:  6/01 CT head >> 1.7 x 1.3 cm Lt parietal ICH, 0.6 cm Lt pons ICH, scattered small vessel ischemia 6/03 Echo >>  CULTURES:  ANTIBIOTICS:  SIGNIFICANT EVENTS: 6/01 Admit 6/2- HTN, poor airway control, intubated  LINES/TUBES: 6/2 ett>> DISCUSSION: 50 yo female with Lt parietal and Lt pontine hemorrhage in setting of HTN urgency.  Now with snoring respirations, poor gag reflex, and intermittent hypoxia. 6/2 intubated for airway protection. Not weanable. May need trach in future.  ASSESSMENT / PLAN:  NEUROLOGIC A:   Acute Lt parietal and pontine hemorrhage P:   RASS goal: 0 Airway secured 6/2 with ETT neurochecks as able Propofol  Imaging per neuro  PULMONARY A: Acute hypoxic respiratory failure Upper airway poor control secretions P:   Vent bundle F/u CXR,   CARDIOVASCULAR A:  HTN urgency. P:  Nicardipine to keep SBP < 160 Echo awaiting results  RENAL  Recent Labs Lab 07/12/15 2321 07/13/15 1846 07/14/15 0355  K 2.9* 3.4* 3.0*     A:   Hypokalemia. P:   Replace electrolytes as needed   GASTROINTESTINAL A:   Dysphagia. P:   NPO strict  OGT and feed   HEMATOLOGIC A:   DVT prevention P:  SCDs for DVT prophylaxis Cbc in am   INFECTIOUS A:   No evidence for infection. P:   Monitor clinically, get pcxr post intubation Assess airway secretions upon intubation  ENDOCRINE CBG (last 3)   Recent Labs  07/13/15 2344 07/14/15 0433 07/14/15 0723  GLUCAP 118* 97 101*     A:   Mild hyperglycemia.  P:     Monitor blood sugar on BMET   FAMILY  - Updates: no family in room  - Inter-disciplinary family meet or Palliative Care meeting due by: 6/9  Brett Canales Minor ACNP [facility] Pager  [phone] till 3 pm If no answer page [phone] 07/14/2015, 9:29 AM   Attending note: I have seen and examined the patient with nurse practitioner/resident and agree with the note. History, labs and imaging reviewed.  50 Y/O with lt pontine hemorrhage, HTN urgency.VDRF No weaning due to mental status Will likely need trach.  Critical care time 35 mins. This represents my time independent of the NPs time taking care of the patient.  Chilton Greathouse MD [facility] Pager [phone] If no answer or after 3pm call: [phone] 07/14/2015, 3:53 PM

## 2015-07-15 ENCOUNTER — Inpatient Hospital Stay (HOSPITAL_COMMUNITY): Payer: Self-pay

## 2015-07-15 LAB — GLUCOSE, CAPILLARY
GLUCOSE-CAPILLARY: 105 mg/dL — AB (ref 65–99)
GLUCOSE-CAPILLARY: 120 mg/dL — AB (ref 65–99)
GLUCOSE-CAPILLARY: 136 mg/dL — AB (ref 65–99)
GLUCOSE-CAPILLARY: 128 mg/dL — AB (ref 65–99)
Glucose-Capillary: 112 mg/dL — ABNORMAL HIGH (ref 65–99)
Glucose-Capillary: 107 mg/dL — ABNORMAL HIGH (ref 65–99)
Glucose-Capillary: 125 mg/dL — ABNORMAL HIGH (ref 65–99)

## 2015-07-15 LAB — CBC
HEMATOCRIT: 37.5 % (ref 36.0–46.0)
HEMOGLOBIN: 10.9 g/dL — AB (ref 12.0–15.0)
MCH: 21.5 pg — ABNORMAL LOW (ref 26.0–34.0)
MCHC: 29.1 g/dL — ABNORMAL LOW (ref 30.0–36.0)
MCV: 74.1 fL — ABNORMAL LOW (ref 78.0–100.0)
Platelets: 303 10*3/uL (ref 150–400)
RBC: 5.06 MIL/uL (ref 3.87–5.11)
RDW: 20.5 % — ABNORMAL HIGH (ref 11.5–15.5)
WBC: 13.2 10*3/uL — AB (ref 4.0–10.5)

## 2015-07-15 LAB — BASIC METABOLIC PANEL
ANION GAP: 10 (ref 5–15)
BUN: 17 mg/dL (ref 6–20)
CALCIUM: 8.9 mg/dL (ref 8.9–10.3)
CO2: 25 mmol/L (ref 22–32)
Chloride: 109 mmol/L (ref 101–111)
Creatinine, Ser: 0.81 mg/dL (ref 0.44–1.00)
Glucose, Bld: 113 mg/dL — ABNORMAL HIGH (ref 65–99)
POTASSIUM: 3.1 mmol/L — AB (ref 3.5–5.1)
Sodium: 144 mmol/L (ref 135–145)

## 2015-07-15 LAB — PHOSPHORUS: PHOSPHORUS: 3.1 mg/dL (ref 2.5–4.6)

## 2015-07-15 LAB — MAGNESIUM: MAGNESIUM: 1.8 mg/dL (ref 1.7–2.4)

## 2015-07-15 MED ORDER — POTASSIUM CHLORIDE 20 MEQ/15ML (10%) PO SOLN: 40.0000 meq | Freq: Once | ORAL | Status: DC

## 2015-07-15 MED ORDER — POTASSIUM CHLORIDE 20 MEQ PO PACK
40.0000 meq | PACK | Freq: Two times a day (BID) | ORAL | Status: AC
  Administered 2015-07-15 (×2): 40 meq via ORAL
  Filled 2015-07-15 (×2): qty 2

## 2015-07-15 MED ORDER — ENOXAPARIN SODIUM 40 MG/0.4ML ~~LOC~~ SOLN
40.0000 mg | SUBCUTANEOUS | Status: DC
  Administered 2015-07-15 – 2015-07-24 (×10): 40 mg via SUBCUTANEOUS
  Filled 2015-07-15 (×9): qty 0.4

## 2015-07-15 MED ORDER — AMLODIPINE BESYLATE 10 MG PO TABS
10.0000 mg | ORAL_TABLET | Freq: Every day | ORAL | Status: DC
  Administered 2015-07-16 – 2015-07-24 (×9): 10 mg
  Filled 2015-07-15 (×10): qty 1

## 2015-07-15 NOTE — Progress Notes (Signed)
PULMONARY / CRITICAL CARE MEDICINE   Name: Gwendolyn Morales MRN: [id number] DOB: 11-May-1965    ADMISSION DATE:  07/12/2015 CONSULTATION DATE:  07/13/2015  REFERRING MD:  Dr. [name]  CHIEF COMPLAINT:  Fall  HISTORY OF PRESENT ILLNESS:   50 yo female presented to ER after falling.  She had difficulty with speech and Rt sided weakness.  She had BP 280/150.  CT head showed 1.7 x 1.3 cm Lt parietal hemorrhage and 0.6 cm lateral pons hemorrhage.  NIH stroke score 10.  She was started on nicardipine for BP control, and admitted by stroke service.  She was noted to having snoring respiratory pattern, poor gag reflex, and intermittent hypoxia. Required intubation.    SUBJECTIVE:  Awake and alert  VITAL SIGNS: BP 172/78 mmHg  Pulse 108  Temp(Src) 98.6 F (37 C) (Axillary)  Resp 32  Ht  (1.676 m)  Wt 260 lb 9.3 oz (118.2 kg)  BMI 42.08 kg/m2  SpO2 100%  HEMODYNAMICS:    VENTILATOR SETTINGS: Vent Mode:  [-] CPAP;PSV FiO2 (%):  [40 %] 40 % Set Rate:  [14 bmp] 14 bmp Vt Set:  [470 mL] 470 mL PEEP:  [5 cmH20] 5 cmH20 Pressure Support:  [5 cmH20-8 cmH20] 8 cmH20 Plateau Pressure:  [10 cmH20-17 cmH20] 10 cmH20  INTAKE / OUTPUT: I/O last 3 completed shifts: In: 6808.5 [I.V.:5448.5; NG/GT:1360] Out: 2365 [Urine:2365]  PHYSICAL EXAMINATION: General:  Awake and follows commands Neuro:  Follows commands, tracks an follows HEENT: No JVD, ETT-> vent, pupils pinpoint responsive Cardiovascular:  s1 s2 RRT no r Lungs:  Ronchi, decreased in bases Abdomen:  Soft, obese, NT,  Musculoskeletal:  No sig edema Skin:  No rash  LABS:  BMET  Recent Labs Lab 07/13/15 1846 07/14/15 0355 07/15/15 0550  NA 142 142 144  K 3.4* 3.0* 3.1*  CL 109 107 109  CO2 BUN CREATININE 0.91 0.92 0.81  GLUCOSE 122* 112* 113*    Electrolytes  Recent Labs Lab 07/13/15 1846 07/14/15 0355 07/14/15 1637 07/15/15 0550  CALCIUM 8.5* 8.7*  --  8.9  MG 1.7 1.9 1.9 1.8  PHOS 4.5  3.2 2.6 3.1    CBC  Recent Labs Lab 07/12/15 2317 07/12/15 2321 07/14/15 0355 07/15/15 0550  WBC 11.3*  --  12.6* 13.2*  HGB 11.4* 13.3 10.9* 10.9*  HCT 37.8 39.0 36.9 37.5  PLT 359  --  363 303    Coag's  Recent Labs Lab 07/12/15 2317  APTT 30  INR 1.18    Sepsis Markers No results for input(s): LATICACIDVEN, PROCALCITON, O2SATVEN in the last 168 hours.  ABG  Recent Labs Lab 07/13/15 1901  PHART 7.349*  PCO2ART 45.1*  PO2ART 63.0*    Liver Enzymes  Recent Labs Lab 07/12/15 2317  AST 20  ALT 19  ALKPHOS 97  BILITOT 0.4  ALBUMIN 3.8    Cardiac Enzymes No results for input(s): TROPONINI, PROBNP in the last 168 hours.  Glucose  Recent Labs Lab 07/14/15 0723 07/14/15 1112 07/14/15 1552 07/14/15 2030 07/14/15 2326 07/15/15 0707  GLUCAP 101* 102* 100* 97 105* 120*    Imaging Dg Chest Port 1 View  07/15/2015  CLINICAL DATA:  Respiratory failure EXAM: PORTABLE CHEST 1 VIEW COMPARISON:  07/14/2015 FINDINGS: The endotracheal tube tip is above the carina. The nasogastric tube tip is below the field of view. Moderate cardiac enlargement. There is no pleural effusion or interstitial edema. Similar appearance of left base atelectasis. IMPRESSION:  1. Left base atelectasis. Electronically Signed   By: Signa Kellaylor  Stroud M.D.   On: 07/15/2015 07:29     STUDIES:  6/01 CT head >> 1.7 x 1.3 cm Lt parietal ICH, 0.6 cm Lt pons ICH, scattered small vessel ischemia 6/03 Echo >>  CULTURES:  ANTIBIOTICS:  SIGNIFICANT EVENTS: 6/01 Admit 6/2- HTN, poor airway control, intubated 6/4 very awake  LINES/TUBES: 6/2 ett>> DISCUSSION: 50 yo female with Lt parietal and Lt pontine hemorrhage in setting of HTN urgency.  Now with snoring respirations, poor gag reflex, and intermittent hypoxia. 6/2 intubated for airway protection. Not weanable. May need trach in future. 6/4 much more awake. Consider extubation prior to traching.  ASSESSMENT / PLAN:  NEUROLOGIC A:    Acute Lt parietal and pontine hemorrhage P:   RASS goal: 0 Airway secured 6/2 with ETT neurochecks as able Propofol  Imaging per neuro Much more awake 6/4  PULMONARY A: Acute hypoxic respiratory failure Upper airway poor control secretions P:   Vent bundle F/u CXR, Weanable but cord edema noted on intubation therefore airway is main issue. Suspect better to attempt extubation in am with supprt equipment at bedside and if re intuabted proceed with trach.   CARDIOVASCULAR A:  HTN urgency. P:  Nicardipine to keep SBP < 160 Echo ef 65% g1df  RENAL  Recent Labs Lab 07/13/15 1846 07/14/15 0355 07/15/15 0550  K 3.4* 3.0* 3.1*     A:   Hypokalemia. P:   Replace electrolytes as needed   GASTROINTESTINAL A:   Dysphagia. P:   NPO strict  OGT and feed   HEMATOLOGIC A:   DVT prevention P:  SCDs for DVT prophylaxis Cbc in am   INFECTIOUS A:   No evidence for infection. P:   Monitor clinically, get pcxr post intubation Assess airway secretions upon intubation  ENDOCRINE CBG (last 3)   Recent Labs  07/14/15 2030 07/14/15 2326 07/15/15 0707  GLUCAP 97 105* 120*     A:   Mild hyperglycemia.  P:     Monitor blood sugar on BMET   FAMILY  - Updates: no family in room  - Inter-disciplinary family meet or Palliative Care meeting due by: 6/9  Brett CanalesSteve Minor ACNP [facility] Pager [phone] till 3 pm If no answer page [phone] 07/15/2015, 10:01 AM   Attending note: I have seen and examined the patient with nurse practitioner/resident and agree with the note. History, labs and imaging reviewed.  50 Y/O with lt pontine hemorrhage, HTN urgency.VDRF No weaning due to mental status Will likely need trach next week  Critical care time 35 mins. This represents my time independent of the NPs time taking care of the patient.  Chilton GreathousePraveen Kynslee Baham MD [facility] Pager [phone] If no answer or after 3pm call:  [phone] 07/15/2015, 3:56 PM

## 2015-07-15 NOTE — Progress Notes (Signed)
STROKE TEAM PROGRESS NOTE   HISTORY OF PRESENT ILLNESS (per record) Gwendolyn Morales is an 50 y.o. female with a history of hypertension and noncompliance with treatment brought to the ED and code stroke status following acute onset of a physical fall at home and afterwards having difficulty with speech output as well as right-sided weakness. She has no previous history of stroke nor TIA. She has not been on antiplatelet therapy. Blood pressure was markedly elevated at 280/150, per EMS. Blood pressure remained elevated at 235/122 after 10 mg of labetalol 2. Cardene drip was started. CT scan of her head showed a 1.7 x 1.3 cm acute left parietal hemorrhage, as well as a 0.6 cm hemorrhage involving the left lateral pons. NIH stroke score was 10. ICH score 3. She was LKW at 8:15 PM on 07/12/2015. Patient was not administered IV t-PA secondary to ICH. She was admitted to the neuro ICU for further evaluation and treatment.   SUBJECTIVE (INTERVAL HISTORY)  . She has remained  stable overnight and blood pressure has been controlled on Cardene drip. No family at the bedside. She is arousable and following commands consistently when sedation is stopped  OBJECTIVE Temp:  [98 F (36.7 C)-100.7 F (38.2 C)] 99.5 F (37.5 C) (06/04 1200) Pulse Rate:  [80-108] 106 (06/04 1045) Cardiac Rhythm:  [-] Normal sinus rhythm (06/04 0800) Resp:  [16-35] 33 (06/04 1045) BP: (125-202)/(73-130) 202/80 mmHg (06/04 1045) SpO2:  [96 %-100 %] 97 % (06/04 1045) FiO2 (%):  [40 %] 40 % (06/04 1045) Weight:  [260 lb 9.3 oz (118.2 kg)] 260 lb 9.3 oz (118.2 kg) (06/04 0500)  CBC:   Recent Labs Lab 07/12/15 2317  07/14/15 0355 07/15/15 0550  WBC 11.3*  --  12.6* 13.2*  NEUTROABS 9.2*  --   --   --   HGB 11.4*  < > 10.9* 10.9*  HCT 37.8  < > 36.9 37.5  MCV 72.1*  --  74.1* 74.1*  PLT 359  --  363 303  < > = values in this interval not displayed.  Basic Metabolic Panel:   Recent Labs Lab 07/14/15 0355  07/14/15 1637 07/15/15 0550  NA 142  --  144  K 3.0*  --  3.1*  CL 107  --  109  CO2 25  --  25  GLUCOSE 112*  --  113*  BUN 14  --  17  CREATININE 0.92  --  0.81  CALCIUM 8.7*  --  8.9  MG 1.9 1.9 1.8  PHOS 3.2 2.6 3.1    Lipid Panel:     Component Value Date/Time   CHOL 187 07/13/2015 1057   TRIG 138 07/13/2015 1846   HDL 60 07/13/2015 1057   CHOLHDL 3.1 07/13/2015 1057   VLDL 20 07/13/2015 1057   LDLCALC 107* 07/13/2015 1057   HgbA1c:  Lab Results  Component Value Date   HGBA1C 5.5 07/13/2015   Urine Drug Screen: No results found for: LABOPIA, COCAINSCRNUR, LABBENZ, AMPHETMU, THCU, LABBARB    IMAGING  Ct Angio Head W/cm &/or Wo Cm  07/13/2015  CLINICAL DATA:  Hypertensive patient, non compliant, blood pressure 235/122 on admission, recent fall at home, difficulty with speech and RIGHT-sided weakness afterwards. EXAM: CT ANGIOGRAPHY HEAD AND NECK TECHNIQUE: Multidetector CT imaging of the head and neck was performed using the standard protocol during bolus administration of intravenous contrast. Multiplanar CT image reconstructions and MIPs were obtained to evaluate the vascular anatomy. Carotid stenosis measurements (when applicable) are obtained  utilizing NASCET criteria, using the distal internal carotid diameter as the denominator. CONTRAST:  Isovue 370, 50 mL. COMPARISON:  CT head earlier today. FINDINGS: CT HEAD Calvarium and skull base: No fracture or destructive lesion. Mastoids and middle ears are grossly clear. Paranasal sinuses: Imaged portions are clear. Orbits: Negative. Brain: 14 x 18 x 8 mm LEFT frontal opercular hemorrhage, volume slight surrounding edema, similar to priors. 6 mm spherical hemorrhage, LEFT pons, unchanged from priors. No new areas of hemorrhage since earlier today. Severe hypoattenuation of the white matter, likely chronic microvascular ischemic change. Occipital hypoattenuation does not clearly affect the cortex, although an element of  hypertensive encephalopathy is not excluded. CTA NECK Aortic arch: Standard branching. Imaged portion shows no evidence of aneurysm or dissection. No significant stenosis of the major arch vessel origins. Right carotid system: Minor atheromatous change. No evidence of dissection, stenosis (50% or greater) or occlusion. Left carotid system: Minor atheromatous change. Item No evidence of dissection, stenosis (50% or greater) or occlusion. Vertebral arteries: LEFT slightly larger. No evidence of dissection, stenosis (50% or greater) or occlusion. Nonvascular soft tissues:  Unremarkable. CTA HEAD Anterior circulation: Advanced dolichoectasia. Minor cavernous carotid calcification bilaterally. No significant stenosis, proximal occlusion, aneurysm, or vascular malformation. Posterior circulation: Slight distal vertebral calcification on the LEFT, non stenotic. No significant stenosis, proximal occlusion, aneurysm, or vascular malformation. Venous sinuses: As permitted by contrast timing, patent. Anatomic variants: None of significance. Delayed phase:   No abnormal intracranial enhancement. IMPRESSION: Stable acute small volume parenchymal hemorrhages as described in the LEFT pons and LEFT frontal operculum. No underlying extracranial or intracranial stenosis, vascular malformation or aneurysm. No evidence for venous sinus thrombosis. Extensive hypoattenuation of white matter, likely severe chronic microvascular ischemic change related to hypertension. Electronically Signed   By: Elsie Stain M.D.   On: 07/13/2015 13:16   Ct Head Wo Contrast  07/14/2015  CLINICAL DATA:  Follow-up exam for acute intracranial hemorrhage. EXAM: CT HEAD WITHOUT CONTRAST TECHNIQUE: Contiguous axial images were obtained from the base of the skull through the vertex without intravenous contrast. COMPARISON:  Prior study from 07/13/2015. FINDINGS: Patient is intubated with partial visualization of endotracheal and enteric tubes. Paranasal  sinuses are largely clear. No mastoid effusion. Middle ear cavities are clear. Calvarium intact. Chronic microvascular ischemic disease again noted, stable. Left frontal operculum air hemorrhage measures 16 x 16 x 10 mm on today's study. Allowing for differences in technique, this is overall little interval changed. Mild localized surrounding edema, stable. Additional spherical hemorrhage at the left pons also little interval changed measuring 6 mm. No significant edema or mass effect. No new hemorrhage. No large vessel territory infarct. No midline shift or hydrocephalus. No extra-axial fluid collection. Scalp soft tissues within normal limits. No acute abnormality about the orbits. IMPRESSION: 1. Overall no significant interval change in left frontal opercular hemorrhage with mild localized edema. 2. Stable 6 mm left pontine hemorrhage without significant edema. 3. No other new acute intracranial process. Electronically Signed   By: Rise Mu M.D.   On: 07/14/2015 02:18   Ct Angio Neck W/cm &/or Wo/cm  07/13/2015  CLINICAL DATA:  Hypertensive patient, non compliant, blood pressure 235/122 on admission, recent fall at home, difficulty with speech and RIGHT-sided weakness afterwards. EXAM: CT ANGIOGRAPHY HEAD AND NECK TECHNIQUE: Multidetector CT imaging of the head and neck was performed using the standard protocol during bolus administration of intravenous contrast. Multiplanar CT image reconstructions and MIPs were obtained to evaluate the vascular anatomy. Carotid stenosis measurements (  when applicable) are obtained utilizing NASCET criteria, using the distal internal carotid diameter as the denominator. CONTRAST:  Isovue 370, 50 mL. COMPARISON:  CT head earlier today. FINDINGS: CT HEAD Calvarium and skull base: No fracture or destructive lesion. Mastoids and middle ears are grossly clear. Paranasal sinuses: Imaged portions are clear. Orbits: Negative. Brain: 14 x 18 x 8 mm LEFT frontal opercular  hemorrhage, volume slight surrounding edema, similar to priors. 6 mm spherical hemorrhage, LEFT pons, unchanged from priors. No new areas of hemorrhage since earlier today. Severe hypoattenuation of the white matter, likely chronic microvascular ischemic change. Occipital hypoattenuation does not clearly affect the cortex, although an element of hypertensive encephalopathy is not excluded. CTA NECK Aortic arch: Standard branching. Imaged portion shows no evidence of aneurysm or dissection. No significant stenosis of the major arch vessel origins. Right carotid system: Minor atheromatous change. No evidence of dissection, stenosis (50% or greater) or occlusion. Left carotid system: Minor atheromatous change. Item No evidence of dissection, stenosis (50% or greater) or occlusion. Vertebral arteries: LEFT slightly larger. No evidence of dissection, stenosis (50% or greater) or occlusion. Nonvascular soft tissues:  Unremarkable. CTA HEAD Anterior circulation: Advanced dolichoectasia. Minor cavernous carotid calcification bilaterally. No significant stenosis, proximal occlusion, aneurysm, or vascular malformation. Posterior circulation: Slight distal vertebral calcification on the LEFT, non stenotic. No significant stenosis, proximal occlusion, aneurysm, or vascular malformation. Venous sinuses: As permitted by contrast timing, patent. Anatomic variants: None of significance. Delayed phase:   No abnormal intracranial enhancement. IMPRESSION: Stable acute small volume parenchymal hemorrhages as described in the LEFT pons and LEFT frontal operculum. No underlying extracranial or intracranial stenosis, vascular malformation or aneurysm. No evidence for venous sinus thrombosis. Extensive hypoattenuation of white matter, likely severe chronic microvascular ischemic change related to hypertension. Electronically Signed   By: Elsie Stain M.D.   On: 07/13/2015 13:16   Dg Chest Port 1 View  07/15/2015  CLINICAL DATA:   Respiratory failure EXAM: PORTABLE CHEST 1 VIEW COMPARISON:  07/14/2015 FINDINGS: The endotracheal tube tip is above the carina. The nasogastric tube tip is below the field of view. Moderate cardiac enlargement. There is no pleural effusion or interstitial edema. Similar appearance of left base atelectasis. IMPRESSION: 1. Left base atelectasis. Electronically Signed   By: Signa Kell M.D.   On: 07/15/2015 07:29   Dg Chest Port 1 View  07/14/2015  CLINICAL DATA:  Cerebral infarction. EXAM: PORTABLE CHEST 1 VIEW COMPARISON:  07/13/2015 FINDINGS: Enteric tube scratched it endotracheal tube terminates approximately 3.5 cm above the carina. Enteric tube courses into the left upper abdomen with tip not imaged. Prominent with of the superior mediastinum is unchanged and may be secondary to vascular structures and low lung volumes, without abnormality identified in this region on the recent neck CTA. Patchy left greater than right basilar lung opacities have not significantly changed and likely reflect atelectasis. No sizable pleural effusion or pneumothorax is identified. IMPRESSION: Unchanged bibasilar atelectasis. Electronically Signed   By: Sebastian Ache M.D.   On: 07/14/2015 07:55   Dg Chest Port 1 View  07/13/2015  CLINICAL DATA:  ET tube placement EXAM: PORTABLE CHEST 1 VIEW COMPARISON:  07/13/2015 FINDINGS: Endotracheal tube is 3.5 cm above the carina. Patchy bilateral lower lobe airspace opacities, likely atelectasis. Low lung volumes. Heart is borderline in size. No effusions. IMPRESSION: Endotracheal tube 3.5 cm above the carina. Low lung volumes with bibasilar atelectasis. Electronically Signed   By: Charlett Nose M.D.   On: 07/13/2015 18:07   Dg  Abd Portable 1v  07/13/2015  CLINICAL DATA:  Encounter for OG tube EXAM: PORTABLE ABDOMEN - 1 VIEW COMPARISON:  None. FINDINGS: NG tube tip is in the mid stomach. Nonobstructive bowel gas pattern. IMPRESSION: NG tube tip in the mid stomach. Electronically Signed    By: Charlett Nose M.D.   On: 07/13/2015 18:07    PHYSICAL EXAM Obese middle aged Caucasian lady who is intubated and sedated on a ventilator. . Afebrile. Head is nontraumatic. Neck is supple without bruit.    Cardiac exam no murmur or gallop. Lungs are clear to auscultation. Distal pulses are well felt. Neurological Exam :  Patient is sedated and intubated. When sedation is off she is able to awaken and follows commands consistently. Pupils right 3 mm left 5 mm both reactive. Fundi were not visualized. Dolls eye movements are full range. No nystagmus. No facial weakness. Blinks to threat bilaterally. Spontaneously moves left side greater than right side. Mild right upper and lower extremity weakness. Right plantar upgoing left downgoing. Gait was not tested.  NIH stroke scale 6. ICH scale 3. ASSESSMENT/PLAN Ms. Mystie Ormand is a 50 y.o. female with history of HTN and noncompliance presenting with speech difficulty and R sided weakness following a fall. BP elevated. 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  CTA head  ordered  CTA neck  ordered  Carotid Doppler  canceled  2D Echo  pending   SCDs for VTE prophylaxis    No antithrombotic prior to admission  Ongoing aggressive stroke risk factor management  Therapy recommendations:  pending   Disposition:  pending   Hypertensive Emergency  BP 280/150 on arrival to hospital in setting of neuro symptoms  Started on cardene  Stable on cardene this am  SBP goal < 160  Other Stroke Risk Factors  Morbid Obesity, Body mass index is 42.08 kg/(m^2).   Other Active Problems  Leukocytosis, WBC 11.3, Check labs am  Hypokalemia, K 2.9, replaced with 4 runs.  Check labs am  Hospital day # 3  I have personally examined this patient, reviewed notes, independently viewed imaging studies, participated in medical decision making and plan of care. I have made any additions or clarifications directly to the above note. 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 Patient appears neurologically stable but is intubated mainly for upper airway issues. Critical care service to decide when she can be extubated. We'll wean Cardene drip and increase blood pressure medications through NG tube. Change systolic goal below 180. Discussed with critical care physician Asst. Brett Canales Minor.  This patient is critically ill and at significant risk of neurological worsening, death and care requires constant monitoring of vital signs, hemodynamics,respiratory and cardiac monitoring, extensive review of multiple databases, frequent neurological assessment, discussion with family, other specialists and medical decision making of high complexity.I have made any additions or clarifications directly to the above note.This critical care time does not reflect procedure time, or teaching time or supervisory time of PA/NP/Med Resident etc but could involve care discussion time.  I spent 32 minutes of neurocritical care time  in the care of  this patient.  Delia Heady, MD Medical Director [facility] Pager: [phone] 07/15/2015 11:25 AM     To contact Stroke Continuity provider, please refer to [email or web address]. After hours, contact General Neurology

## 2015-07-15 NOTE — Progress Notes (Signed)
Increased PS to 8 due to increased WOB.

## 2015-07-15 NOTE — Progress Notes (Signed)
Patient placed back on full vent support due to increased WOB and B/P.

## 2015-07-16 ENCOUNTER — Inpatient Hospital Stay (HOSPITAL_COMMUNITY): Payer: Self-pay

## 2015-07-16 DIAGNOSIS — I10 Essential (primary) hypertension: Secondary | ICD-10-CM

## 2015-07-16 DIAGNOSIS — J9621 Acute and chronic respiratory failure with hypoxia: Secondary | ICD-10-CM

## 2015-07-16 DIAGNOSIS — J9600 Acute respiratory failure, unspecified whether with hypoxia or hypercapnia: Secondary | ICD-10-CM

## 2015-07-16 LAB — RAPID URINE DRUG SCREEN, HOSP PERFORMED
AMPHETAMINES: NOT DETECTED
BENZODIAZEPINES: NOT DETECTED
Barbiturates: NOT DETECTED
Cocaine: NOT DETECTED
OPIATES: NOT DETECTED
Tetrahydrocannabinol: NOT DETECTED

## 2015-07-16 LAB — GLUCOSE, CAPILLARY
GLUCOSE-CAPILLARY: 123 mg/dL — AB (ref 65–99)
GLUCOSE-CAPILLARY: 116 mg/dL — AB (ref 65–99)
GLUCOSE-CAPILLARY: 121 mg/dL — AB (ref 65–99)
GLUCOSE-CAPILLARY: 157 mg/dL — AB (ref 65–99)
Glucose-Capillary: 129 mg/dL — ABNORMAL HIGH (ref 65–99)

## 2015-07-16 LAB — MAGNESIUM: Magnesium: 1.8 mg/dL (ref 1.7–2.4)

## 2015-07-16 LAB — C-REACTIVE PROTEIN: CRP: 17.5 mg/dL — AB (ref <1.0)

## 2015-07-16 LAB — CBC
HCT: 32.5 % — ABNORMAL LOW (ref 36.0–46.0)
HEMOGLOBIN: 9.8 g/dL — AB (ref 12.0–15.0)
MCH: 22 pg — AB (ref 26.0–34.0)
MCHC: 30.2 g/dL (ref 30.0–36.0)
MCV: 73 fL — AB (ref 78.0–100.0)
PLATELETS: 311 10*3/uL (ref 150–400)
RBC: 4.45 MIL/uL (ref 3.87–5.11)
RDW: 20.1 % — ABNORMAL HIGH (ref 11.5–15.5)
WBC: 12.4 10*3/uL — ABNORMAL HIGH (ref 4.0–10.5)

## 2015-07-16 LAB — BASIC METABOLIC PANEL
Anion gap: 5 (ref 5–15)
BUN: 22 mg/dL — ABNORMAL HIGH (ref 6–20)
CHLORIDE: 114 mmol/L — AB (ref 101–111)
CO2: 27 mmol/L (ref 22–32)
CREATININE: 0.78 mg/dL (ref 0.44–1.00)
Calcium: 8.4 mg/dL — ABNORMAL LOW (ref 8.9–10.3)
GFR calc non Af Amer: >60 mL/min (ref >60)
Glucose, Bld: 116 mg/dL — ABNORMAL HIGH (ref 65–99)
Potassium: 3 mmol/L — ABNORMAL LOW (ref 3.5–5.1)
SODIUM: 146 mmol/L — AB (ref 135–145)

## 2015-07-16 LAB — SEDIMENTATION RATE: SED RATE: 45 mm/h — AB (ref 0–22)

## 2015-07-16 LAB — PHOSPHORUS: PHOSPHORUS: 2.8 mg/dL (ref 2.5–4.6)

## 2015-07-16 LAB — TRIGLYCERIDES: Triglycerides: 134 mg/dL (ref <150)

## 2015-07-16 MED ORDER — CLEVIDIPINE BUTYRATE 0.5 MG/ML IV EMUL
0.0000 mg/h | INTRAVENOUS | Status: DC
  Administered 2015-07-16: 3 mg/h via INTRAVENOUS
  Administered 2015-07-16: 1 mg/h via INTRAVENOUS
  Administered 2015-07-17 (×2): 21 mg/h via INTRAVENOUS
  Administered 2015-07-17: 14 mg/h via INTRAVENOUS
  Administered 2015-07-17: 18 mg/h via INTRAVENOUS
  Administered 2015-07-17: 5 mg/h via INTRAVENOUS
  Administered 2015-07-17: 21 mg/h via INTRAVENOUS
  Filled 2015-07-16 (×9): qty 50
  Filled 2015-07-16: qty 100
  Filled 2015-07-16: qty 50

## 2015-07-16 MED ORDER — METHYLPREDNISOLONE SODIUM SUCC 40 MG IJ SOLR
40.0000 mg | Freq: Once | INTRAMUSCULAR | Status: AC
  Administered 2015-07-16: 40 mg via INTRAVENOUS
  Filled 2015-07-16: qty 1

## 2015-07-16 MED ORDER — POTASSIUM CHLORIDE 20 MEQ PO PACK
40.0000 meq | PACK | Freq: Two times a day (BID) | ORAL | Status: AC
  Administered 2015-07-16 (×2): 40 meq
  Filled 2015-07-16 (×2): qty 2

## 2015-07-16 MED ORDER — NICARDIPINE HCL IN NACL 20-0.86 MG/200ML-% IV SOLN
INTRAVENOUS | Status: AC
  Filled 2015-07-16: qty 200

## 2015-07-16 MED ORDER — LISINOPRIL 20 MG PO TABS
20.0000 mg | ORAL_TABLET | Freq: Two times a day (BID) | ORAL | Status: DC
  Administered 2015-07-16 – 2015-07-24 (×16): 20 mg via ORAL
  Filled 2015-07-16 (×17): qty 1

## 2015-07-16 NOTE — Progress Notes (Signed)
RT attempted to hear a cuff leak on the patient. No cuff leak currently. MD notified.

## 2015-07-16 NOTE — Progress Notes (Signed)
PULMONARY / CRITICAL CARE MEDICINE   Name: Gwendolyn Morales MRN: [id number] DOB: 02-Jun-1965    ADMISSION DATE:  07/12/2015 CONSULTATION DATE:  07/13/2015  REFERRING MD:  Dr. [name]  CHIEF COMPLAINT:  Fall  HISTORY OF PRESENT ILLNESS:   50 yo female presented to ER after falling.  She had difficulty with speech and Rt sided weakness.  She had BP 280/150.  CT head showed 1.7 x 1.3 cm Lt parietal hemorrhage and 0.6 cm lateral pons hemorrhage.  NIH stroke score 10.  She was started on nicardipine for BP control, and admitted by stroke service.  She was noted to having snoring respiratory pattern, poor gag reflex, and intermittent hypoxia. Required intubation.  SUBJECTIVE:   VITAL SIGNS: BP 154/70 mmHg  Pulse 100  Temp(Src) 99.7 F (37.6 C) (Axillary)  Resp 30  Ht  (1.676 m)  Wt 116 kg (255 lb 11.7 oz)  BMI 41.30 kg/m2  SpO2 96%  HEMODYNAMICS:    VENTILATOR SETTINGS: Vent Mode:  [-] PSV;CPAP FiO2 (%):  [40 %] 40 % Set Rate:  [14 bmp] 14 bmp Vt Set:  [470 mL] 470 mL PEEP:  [5 cmH20] 5 cmH20 Pressure Support:  [10 cmH20] 10 cmH20 Plateau Pressure:  [16 cmH20-18 cmH20] 16 cmH20  INTAKE / OUTPUT: I/O last 3 completed shifts: In: 6834.7 [I.V.:5451.3; NG/GT:1383.3] Out: 4775 [Urine:4775]  PHYSICAL EXAMINATION: General:  Awake and follows commands Neuro:  Follows commands, tracks an follows HEENT: No JVD, ETT-> vent, pupils pinpoint responsive Cardiovascular:  s1 s2 RRT no r Lungs:  Ronchi, decreased in bases Abdomen:  Soft, obese, NT,  Musculoskeletal:  No sig edema Skin:  No rash  LABS:  BMET  Recent Labs Lab 07/14/15 0355 07/15/15 0550 07/16/15 0739  NA 142 144 146*  K 3.0* 3.1* 3.0*  CL 107 109 114*  CO2 BUN 14 17 22*  CREATININE 0.92 0.81 0.78  GLUCOSE 112* 113* 116*    Electrolytes  Recent Labs Lab 07/14/15 0355 07/14/15 1637 07/15/15 0550 07/16/15 0220 07/16/15 0739  CALCIUM 8.7*  --  8.9  --  8.4*  MG 1.9 1.9 1.8 1.8  --   PHOS  3.2 2.6 3.1 2.8  --     CBC  Recent Labs Lab 07/14/15 0355 07/15/15 0550 07/16/15 0220  WBC 12.6* 13.2* 12.4*  HGB 10.9* 10.9* 9.8*  HCT 36.9 37.5 32.5*  PLT 363 303 311    Coag's  Recent Labs Lab 07/12/15 2317  APTT 30  INR 1.18    Sepsis Markers No results for input(s): LATICACIDVEN, PROCALCITON, O2SATVEN in the last 168 hours.  ABG  Recent Labs Lab 07/13/15 1901  PHART 7.349*  PCO2ART 45.1*  PO2ART 63.0*    Liver Enzymes  Recent Labs Lab 07/12/15 2317  AST 20  ALT 19  ALKPHOS 97  BILITOT 0.4  ALBUMIN 3.8    Cardiac Enzymes No results for input(s): TROPONINI, PROBNP in the last 168 hours.  Glucose  Recent Labs Lab 07/15/15 1507 07/15/15 1923 07/15/15 2309 07/16/15 0307 07/16/15 0738 07/16/15 1124  GLUCAP 107* 125* 128* 129* 123* 116*    Imaging Dg Chest Port 1 View  07/16/2015  CLINICAL DATA:  Intracranial hemorrhage.  Intubated patient. EXAM: PORTABLE CHEST 1 VIEW COMPARISON:  Single-view of the chest 07/15/2015. FINDINGS: NG tube and ET tube remain in place. Patchy atelectasis in the left lung base is again seen. The right lung is clear. Heart size is upper normal. No pneumothorax or pleural effusion. IMPRESSION:  No notable change in patchy left basilar atelectasis. Electronically Signed   By: Drusilla Kannerhomas  Dalessio M.D.   On: 07/16/2015 07:43     STUDIES:  6/01 CT head >> 1.7 x 1.3 cm Lt parietal ICH, 0.6 cm Lt pons ICH, scattered small vessel ischemia 6/03 Echo >> LVEF 65%, grade 1 diastolic dysfxn  CULTURES:  ANTIBIOTICS:  SIGNIFICANT EVENTS: 6/01 Admit 6/2- HTN, poor airway control, intubated 6/4 very awake  LINES/TUBES: 6/2 ett>>  DISCUSSION: 50 yo female with Lt parietal and Lt pontine hemorrhage in setting of HTN urgency.  Now with snoring respirations, poor gag reflex, and intermittent hypoxia. 6/2 intubated for airway protection. Concern for UA swelling and irritation. MS improving  ASSESSMENT / PLAN:  NEUROLOGIC A:    Acute Lt parietal and pontine hemorrhage P:   RASS goal: 0 Airway secured 6/2 with ETT neurochecks as able Propofol  Imaging per neuro > CT, MRI brain Much more awake 6/4, hopefully will be an extubation candidate  PULMONARY A: Acute hypoxic respiratory failure Upper airway poor control secretions P:   Vent bundle F/u CXR Weanable but cord edema noted on intubation therefore airway is main issue. Will give another day, treat with steroids x 1, follow for cuff leak and hopefully extubate 6/6.    CARDIOVASCULAR A:  HTN urgency. P:  Clevidipine to keep SBP < 160 Echo ef 65% g1df  RENAL  Recent Labs Lab 07/14/15 0355 07/15/15 0550 07/16/15 0739  K 3.0* 3.1* 3.0*   A:   Hypokalemia. P:   Replace electrolytes as needed   GASTROINTESTINAL A:   Dysphagia prior to intubation  P:   NPO  OGT and feed   HEMATOLOGIC A:   DVT prevention P:  SCDs for DVT prophylaxis Cbc in am   INFECTIOUS A:   No evidence for infection. P:   Monitor clinically, get pcxr post intubation Assess airway secretions upon intubation  ENDOCRINE CBG (last 3)   Recent Labs  07/16/15 0307 07/16/15 0738 07/16/15 1124  GLUCAP 129* 123* 116*     A:   Mild hyperglycemia.  P:     Monitor blood sugar on BMET   FAMILY  - Updates: updated her father at bedside 6/5  - Inter-disciplinary family meet or Palliative Care meeting due by: 6/9  Independent Cc time 35 minutes   Levy Pupaobert Maryori Weide, MD, PhD 07/16/2015, 1:31 PM [facility] [phone] or if no answer [phone]

## 2015-07-16 NOTE — Progress Notes (Signed)
STROKE TEAM PROGRESS NOTE   SUBJECTIVE (INTERVAL HISTORY) No family is at bedside. She is still on cardene drip as well as propofol. She is arousable and following commands. Need to perform MRI and autoimmune work up. 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   OBJECTIVE Temp:  [99.7 F (37.6 C)-100.9 F (38.3 C)] 99.9 F (37.7 C) (06/05 1600) Pulse Rate:  [87-112] 103 (06/05 1930) Cardiac Rhythm:  [-] Normal sinus rhythm (06/05 0800) Resp:  [21-33] 28 (06/05 1930) BP: (133-186)/(70-96) 142/80 mmHg (06/05 1930) SpO2:  [94 %-99 %] 97 % (06/05 1930) FiO2 (%):  [40 %] 40 % (06/05 1800) Weight:  [255 lb 11.7 oz (116 kg)] 255 lb 11.7 oz (116 kg) (06/05 0816)  CBC:   Recent Labs Lab 07/12/15 2317  07/15/15 0550 07/16/15 0220  WBC 11.3*  < > 13.2* 12.4*  NEUTROABS 9.2*  --   --   --   HGB 11.4*  < > 10.9* 9.8*  HCT 37.8  < > 37.5 32.5*  MCV 72.1*  < > 74.1* 73.0*  PLT 359  < > 303 311  < > = values in this interval not displayed.  Basic Metabolic Panel:   Recent Labs Lab 07/15/15 0550 07/16/15 0220 07/16/15 0739  NA 144  --  146*  K 3.1*  --  3.0*  CL 109  --  114*  CO2 25  --  27  GLUCOSE 113*  --  116*  BUN 17  --  22*  CREATININE 0.81  --  0.78  CALCIUM 8.9  --  8.4*  MG 1.8 1.8  --   PHOS 3.1 2.8  --     Lipid Panel:     Component Value Date/Time   CHOL 187 07/13/2015 1057   TRIG 134 07/16/2015 1012   HDL 60 07/13/2015 1057   CHOLHDL 3.1 07/13/2015 1057   VLDL 20 07/13/2015 1057   LDLCALC 107* 07/13/2015 1057   HgbA1c:  Lab Results  Component Value Date   HGBA1C 5.5 07/13/2015   Urine Drug Screen:     Component Value Date/Time   LABOPIA NONE DETECTED 07/16/2015 1414   COCAINSCRNUR NONE DETECTED 07/16/2015 1414   LABBENZ NONE DETECTED 07/16/2015 1414   AMPHETMU NONE DETECTED 07/16/2015 1414   THCU NONE DETECTED 07/16/2015 1414   LABBARB NONE DETECTED 07/16/2015 1414      IMAGING I have personally reviewed the radiological images below and agree with  the radiology interpretations. Blue text is my interpretation  Ct Angio Head and neck W/cm &/or Wo Cm  07/13/2015  IMPRESSION: Stable acute small volume parenchymal hemorrhages as described in the LEFT pons and LEFT frontal operculum. No underlying extracranial or intracranial stenosis, vascular malformation or aneurysm. No evidence for venous sinus thrombosis. Extensive hypoattenuation of white matter, likely severe chronic microvascular ischemic change related to hypertension. 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  Ct Head Wo Contrast  07/14/2015  IMPRESSION: 1. Overall no significant interval change in left frontal opercular hemorrhage with mild localized edema. 2. Stable 6 mm left pontine hemorrhage without significant edema. 3. No other new acute intracranial process.   07/12/2015  IMPRESSION: 1. Acute hemorrhage at the left parietal lobe, measuring 1.7 x 1.3 cm. 2. Small focus of acute hemorrhage at the left side of the pons, measuring 0.6 cm. 3. Scattered small vessel ischemic microangiopathy and mild chronic ischemic change at the basal ganglia bilaterally.  TTE -  - Left ventricle: The cavity  size was normal. Wall thickness was  normal. Systolic function was normal. The estimated ejection  fraction was in the range of 60% to 65%. Wall motion was normal;  there were no regional wall motion abnormalities. Doppler  parameters are consistent with abnormal left ventricular  relaxation (grade 1 diastolic dysfunction). - Aortic valve: There was mild to moderate stenosis. Valve area  (VTI): 1.85 cm^2. Valve area (Vmax): 1.42 cm^2. Valve area  (Vmean): 1.39 cm^2. - Mitral valve: Severely calcified annulus. The findings are  consistent with mild stenosis. - Left atrium: The atrium was mildly dilated.  MRI brain - pending  Autoimmune work up - pending   PHYSICAL EXAM Obese middle aged Caucasian lady who is intubated  and sedated on a ventilator. Febrile with low grade fever. Head is nontraumatic. Neck is supple. Cardiac exam no murmur or gallop.   Neurological Exam :  Patient is sedated and intubated. When sedation is off she is able to awaken and follows simple commands consistently. Pupils equal both reactive. Fundi were not visualized. B/l incomplete abduction. No nystagmus. No facial weakness. Spontaneously moves left side greater than right side. On pain stimulation, LUE 2+/5, but trace withdraw for all other extremities. Right plantar upgoing left downgoing. Gait was not tested.   ASSESSMENT/PLAN Ms. Charlett Merkle is a 50 y.o. female with history of HTN and noncompliance presenting with speech difficulty and R sided weakness following a fall. BP elevated. CT showed a L parietal and small pontine hemorrhage could be due to malignant hypertension, however, CNS vasculitis is in DDx.   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 However, location of ICH is really atypical. 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  MRI pending  Consider LP or cerebral angio if needed  2D Echo  EF 60-65%   SCDs and lovenox for VTE prophylaxis  No antithrombotic prior to admission  Ongoing aggressive stroke risk factor management  Therapy recommendations:  pending   Disposition:  pending   Hypertensive Emergency  BP 280/150 on arrival to hospital in setting of neuro symptoms  On cardene, but d/c due to large volume  Put on cleviprex, wean off as able  Put on amlodipine, HCTZ and lisinopril  SBP goal < 160  Other Stroke Risk Factors  Morbid Obesity, Body mass index is 41.3 kg/(m^2).   Other Active  Problems  Leukocytosis, WBC 11.3, Check labs am  Hypokalemia, K 2.9, replaced with 4 runs. Check labs am  Hospital day # 4  This patient is critically ill due to multifocal ICH, respiratory failure, hypertensive emergency and at significant risk of neurological worsening, death form hematoma enlargement, seizure, heart failure. This patient's care requires constant monitoring of vital signs, hemodynamics, respiratory and cardiac monitoring, review of multiple databases, neurological assessment, discussion with family, other specialists and medical decision making of high complexity. I spent 40 minutes of neurocritical care time in the care of this patient.  Marvel Plan, MD PhD Stroke Neurology 07/16/2015 8:02 PM   To contact Stroke Continuity provider, please refer to [email or web address]. After hours, contact General Neurology

## 2015-07-16 NOTE — Progress Notes (Signed)
Inpatient Rehabilitation  PT is recommending IP Rehab.  Pt. remains on vent at this time.  Would recommend IP Rehab consult once extubated.  Please call if questions.  Weldon PickingSusan Janese Radabaugh PT Inpatient Rehab Admissions Coordinator Cell [phone] Office [phone]

## 2015-07-16 NOTE — Evaluation (Signed)
Physical Therapy Evaluation Patient Details Name: Gwendolyn SimonSherry Warnell MRN: [id number] DOB: August 09, 1965 Today's Date: 07/16/2015   History of Present Illness  50 yo female with Lt parietal and Lt pontine hemorrhage in setting of HTN urgency. Now with snoring respirations, poor gag reflex, and intermittent hypoxia. 6/2 intubated for airway protection  Clinical Impression  Patient demonstrates deficits in functional mobility as indicated below. Will need continued skilled PT to address deficits and maximize function. Will see as indicated and progress as tolerated.  OF NTOE: VSS on ventilator support throughout session. Patient tolerated EOB activity ~10 minutes. Noted to have right attention preference but could track and come past midline with cues. Patient with noted edema in all distal extremities. Active movement upon command in bilateral UEs and LLE, no noted movement in RLE during session. At this time, feel patient would benefit from comprehensive therapies post acute discharge. Recommend CIR consult, will continue to see to facilitate POC.     Follow Up Recommendations CIR;Supervision/Assistance - 24 hour    Equipment Recommendations  Other (comment) (TBD)    Recommendations for Other Services Rehab consult     Precautions / Restrictions Precautions Precautions: Fall Restrictions Weight Bearing Restrictions: No      Mobility  Bed Mobility Overal bed mobility: Needs Assistance;+2 for physical assistance Bed Mobility: Supine to Sit;Sit to Supine     Supine to sit: Max assist;+2 for physical assistance;+2 for safety/equipment Sit to supine: Total assist;+2 for physical assistance;+2 for safety/equipment   General bed mobility comments: Max assist to come to EOB (patient did some some intiation of LLE mvement to EOb upon command, assist required via 2 person helicopter technique with chuck pad to rotate hip and elevate trunk to upright positioning  Transfers                  General transfer comment: not assessed today  Ambulation/Gait             General Gait Details: not assessed today  Stairs            Wheelchair Mobility    Modified Rankin (Stroke Patients Only) Modified Rankin (Stroke Patients Only) Pre-Morbid Rankin Score: No symptoms Modified Rankin: Severe disability     Balance Overall balance assessment: Needs assistance Sitting-balance support: Feet supported Sitting balance-Leahy Scale: Zero Sitting balance - Comments: patient did demonstrate modest ability to engage trunk upon command at EOB but was not able to maintain static sitting balance without maximal support                                     Pertinent Vitals/Pain Pain Assessment: No/denies pain    Home Living Family/patient expects to be discharged to:: Private residence Living Arrangements: Spouse/significant other;Children Available Help at Discharge: Family Type of Home: House Home Access: Level entry     Home Layout: One level Home Equipment: None      Prior Function Level of Independence: Independent               Hand Dominance   Dominant Hand: Right    Extremity/Trunk Assessment               Lower Extremity Assessment: Generalized weakness (limited AROM and strength LLE) RLE Deficits / Details: no noted movement throught session on RLE    Cervical / Trunk Assessment:  (increased body habitus)  Communication   Communication: Other (comment) (ventilator)  Cognition  Arousal/Alertness: Awake/alert   Overall Cognitive Status: Difficult to assess Area of Impairment: Attention;Following commands   Current Attention Level: Focused   Following Commands: Follows one step commands with increased time            General Comments General comments (skin integrity, edema, etc.): significant edema noted in distal extremities, repositioned and elevated on pillows to promote edema control    Exercises         Assessment/Plan    PT Assessment Patient needs continued PT services  PT Diagnosis Difficulty walking;Abnormality of gait;Generalized weakness;Hemiplegia dominant side   PT Problem List Decreased strength;Decreased range of motion;Decreased activity tolerance;Decreased balance;Decreased mobility;Decreased coordination;Decreased cognition;Decreased knowledge of use of DME;Impaired sensation;Obesity  PT Treatment Interventions DME instruction;Gait training;Functional mobility training;Therapeutic activities;Therapeutic exercise;Balance training;Neuromuscular re-education;Cognitive remediation;Patient/family education   PT Goals (Current goals can be found in the Care Plan section) Acute Rehab PT Goals Patient Stated Goal: none stated PT Goal Formulation: Patient unable to participate in goal setting Time For Goal Achievement: 07/30/15 Potential to Achieve Goals: Fair    Frequency Min 4X/week   Barriers to discharge        Co-evaluation PT/OT/SLP Co-Evaluation/Treatment: Yes Reason for Co-Treatment: Complexity of the patient's impairments (multi-system involvement);Necessary to address cognition/behavior during functional activity;For patient/therapist safety PT goals addressed during session: Mobility/safety with mobility;Balance         End of Session Equipment Utilized During Treatment: Gait belt;Oxygen (on ventilator) Activity Tolerance: Patient limited by fatigue Patient left: in bed;with call bell/phone within reach Nurse Communication: Mobility status         Time: 0940-1005 PT Time Calculation (min) (ACUTE ONLY): 25 min   Charges:   PT Evaluation $PT Eval High Complexity: 1 Procedure     PT G CodesFabio Asa 07/26/2015, 1:40 PM Charlotte Crumb, PT DPT  [phone]

## 2015-07-16 NOTE — Evaluation (Signed)
Occupational Therapy Evaluation Patient Details Name: Gwendolyn Morales MRN: [id number] DOB: [birth year] Today's Date: 07/16/2015    History of Present Illness 50 yo female with Lt parietal and Lt pontine hemorrhage in setting of HTN urgency. Now with snoring respirations, poor gag reflex, and intermittent hypoxia. 6/2 intubated for airway protection   Clinical Impression   This 50 yo female admitted with above presents to acute OT with deficits below (see OT problem list) thus affecting her PLOF which is thought to be independent. She will benefit from acute OT with follow up OT on CIR to get work towards a Min A level.     Follow Up Recommendations  CIR    Equipment Recommendations   (TBD at next venue)       Precautions / Restrictions Precautions Precautions: Fall Restrictions Weight Bearing Restrictions: No      Mobility Bed Mobility Overal bed mobility: Needs Assistance;+2 for physical assistance Bed Mobility: Supine to Sit;Sit to Supine     Supine to sit: Max assist;+2 for physical assistance;+2 for safety/equipment Sit to supine: Total assist;+2 for physical assistance;+2 for safety/equipment   General bed mobility comments: Max assist to come to EOB (patient did some some intiation of LLE mvement to EOb upon command, assist required via 2 person helicopter technique with chuck pad to rotate hip and elevate trunk to upright positioning  Transfers                 General transfer comment: not assessed today    Balance Overall balance assessment: Needs assistance Sitting-balance support: Feet supported;No upper extremity supported Sitting balance-Leahy Scale: Zero Sitting balance - Comments: patient did demonstrate to engage trunk upon command at EOB a couplef of times but was not able to maintain static sitting balance without maximal support                                    ADL Overall ADL's : Needs assistance/impaired                                        General ADL Comments: Pt currently total A for all bed level ADLs due to decreased use of Bil UEs     Vision Additional Comments: Pt with tendency for head and eyes turned to right, but would turn head and eyes past midline when asked          Pertinent Vitals/Pain Pain Assessment: No/denies pain     Hand Dominance Right   Extremity/Trunk Assessment Upper Extremity Assessment Upper Extremity Assessment: RUE deficits/detail;LUE deficits/detail RUE Deficits / Details: edematous throughout, 1+ movement RUE Coordination: decreased fine motor;decreased gross motor LUE Deficits / Details: edematous throughout, 2 movement througout LUE Coordination: decreased fine motor;decreased gross motor   Lower Extremity Assessment Lower Extremity Assessment: Generalized weakness (limited AROM and strength LLE) RLE Deficits / Details: no noted movement throught session on RLE RLE Coordination: decreased fine motor;decreased gross motor   Cervical / Trunk Assessment Cervical / Trunk Assessment:  (increased body habitus)   Communication Communication Communication: Other (comment) (ventilator)   Cognition Arousal/Alertness: Awake/alert   Overall Cognitive Status: Difficult to assess Area of Impairment: Attention;Following commands   Current Attention Level: Focused   Following Commands: Follows one step commands with increased time  Home Living Family/patient expects to be discharged to:: Inpatient rehab Living Arrangements: Spouse/significant other;Children Available Help at Discharge: Family Type of Home: House Home Access: Level entry     Home Layout: One level     Bathroom Shower/Tub: Tub/shower unit         Home Equipment: None      Lives With: Spouse    Prior Functioning/Environment Level of Independence: Independent             OT Diagnosis: Generalized weakness;Disturbance of vision;Hemiplegia  non-dominant side;Hemiplegia dominant side   OT Problem List: Decreased strength;Decreased range of motion;Impaired balance (sitting and/or standing);Decreased safety awareness;Impaired UE functional use;Decreased knowledge of use of DME or AE;Obesity;Increased edema   OT Treatment/Interventions: Self-care/ADL training;Patient/family education;Neuromuscular education;Therapeutic activities;Visual/perceptual remediation/compensation;Balance training;DME and/or AE instruction    OT Goals(Current goals can be found in the care plan section) Acute Rehab OT Goals Patient Stated Goal: none stated--due to intubated OT Goal Formulation: With patient (could shake head) Time For Goal Achievement: 07/30/15 Potential to Achieve Goals: Fair  OT Frequency: Min 3X/week   Barriers to D/C: Other (comment) (unknown)          Co-evaluation PT/OT/SLP Co-Evaluation/Treatment: Yes Reason for Co-Treatment: Complexity of the patient's impairments (multi-system involvement) PT goals addressed during session: Mobility/safety with mobility;Balance OT goals addressed during session: Strengthening/ROM      End of Session Nurse Communication: Mobility status  Activity Tolerance: Patient tolerated treatment well Patient left: in bed;with call bell/phone within reach   Time: 1610-9604 OT Time Calculation (min): 32 min Charges:  OT General Charges $OT Visit: 1 Procedure OT Evaluation $OT Eval Moderate Complexity: 1 Procedure  Evette Georges [phone] 07/16/2015, 3:45 PM

## 2015-07-17 ENCOUNTER — Inpatient Hospital Stay (HOSPITAL_COMMUNITY): Payer: Self-pay

## 2015-07-17 DIAGNOSIS — J9601 Acute respiratory failure with hypoxia: Secondary | ICD-10-CM

## 2015-07-17 LAB — ANCA TITERS

## 2015-07-17 LAB — CBC
HEMATOCRIT: 34.2 % — AB (ref 36.0–46.0)
Hemoglobin: 10.1 g/dL — ABNORMAL LOW (ref 12.0–15.0)
MCH: 21.7 pg — AB (ref 26.0–34.0)
MCHC: 29.5 g/dL — AB (ref 30.0–36.0)
MCV: 73.4 fL — AB (ref 78.0–100.0)
Platelets: 336 10*3/uL (ref 150–400)
RBC: 4.66 MIL/uL (ref 3.87–5.11)
RDW: 20.1 % — AB (ref 11.5–15.5)
WBC: 14.7 10*3/uL — ABNORMAL HIGH (ref 4.0–10.5)

## 2015-07-17 LAB — GLUCOSE, CAPILLARY
GLUCOSE-CAPILLARY: 121 mg/dL — AB (ref 65–99)
GLUCOSE-CAPILLARY: 130 mg/dL — AB (ref 65–99)
GLUCOSE-CAPILLARY: 124 mg/dL — AB (ref 65–99)
GLUCOSE-CAPILLARY: 159 mg/dL — AB (ref 65–99)
Glucose-Capillary: 147 mg/dL — ABNORMAL HIGH (ref 65–99)
Glucose-Capillary: 137 mg/dL — ABNORMAL HIGH (ref 65–99)
Glucose-Capillary: 125 mg/dL — ABNORMAL HIGH (ref 65–99)

## 2015-07-17 LAB — BASIC METABOLIC PANEL
Anion gap: 8 (ref 5–15)
BUN: 26 mg/dL — ABNORMAL HIGH (ref 6–20)
CALCIUM: 9.1 mg/dL (ref 8.9–10.3)
CO2: 26 mmol/L (ref 22–32)
CREATININE: 0.68 mg/dL (ref 0.44–1.00)
Chloride: 111 mmol/L (ref 101–111)
GFR calc Af Amer: >60 mL/min (ref >60)
GFR calc non Af Amer: >60 mL/min (ref >60)
GLUCOSE: 138 mg/dL — AB (ref 65–99)
Potassium: 3.5 mmol/L (ref 3.5–5.1)
Sodium: 145 mmol/L (ref 135–145)

## 2015-07-17 LAB — ANTI-DNA ANTIBODY, DOUBLE-STRANDED: ds DNA Ab: 3 IU/mL (ref 0–9)

## 2015-07-17 LAB — C4 COMPLEMENT: COMPLEMENT C4, BODY FLUID: 29 mg/dL (ref 14–44)

## 2015-07-17 LAB — SJOGRENS SYNDROME-B EXTRACTABLE NUCLEAR ANTIBODY: SSB (La) (ENA) Antibody, IgG: <0.2 AI (ref 0.0–0.9)

## 2015-07-17 LAB — SJOGRENS SYNDROME-A EXTRACTABLE NUCLEAR ANTIBODY

## 2015-07-17 LAB — ANTINUCLEAR ANTIBODIES, IFA: ANTINUCLEAR ANTIBODIES, IFA: NEGATIVE

## 2015-07-17 LAB — RHEUMATOID FACTOR: Rhuematoid fact SerPl-aCnc: 17.5 IU/mL — ABNORMAL HIGH (ref 0.0–13.9)

## 2015-07-17 LAB — C3 COMPLEMENT: C3 COMPLEMENT: 125 mg/dL (ref 82–167)

## 2015-07-17 LAB — MPO/PR-3 (ANCA) ANTIBODIES: ANCA Proteinase 3: <3.5 U/mL (ref 0.0–3.5)

## 2015-07-17 MED ORDER — VITAL HIGH PROTEIN PO LIQD
1000.0000 mL | ORAL | Status: DC
  Administered 2015-07-17 – 2015-07-20 (×2): 1000 mL

## 2015-07-17 MED ORDER — CLEVIDIPINE BUTYRATE 0.5 MG/ML IV EMUL
0.0000 mg/h | INTRAVENOUS | Status: DC
  Administered 2015-07-17: 18 mg/h via INTRAVENOUS
  Administered 2015-07-17: 21 mg/h via INTRAVENOUS
  Administered 2015-07-17 – 2015-07-18 (×2): 19 mg/h via INTRAVENOUS
  Administered 2015-07-18 (×2): 14 mg/h via INTRAVENOUS
  Administered 2015-07-18: 20 mg/h via INTRAVENOUS
  Administered 2015-07-18: 14 mg/h via INTRAVENOUS
  Administered 2015-07-19: 13 mg/h via INTRAVENOUS
  Administered 2015-07-19: 12 mg/h via INTRAVENOUS
  Administered 2015-07-19: 6 mg/h via INTRAVENOUS
  Administered 2015-07-19: 9 mg/h via INTRAVENOUS
  Administered 2015-07-19: 8 mg/h via INTRAVENOUS
  Administered 2015-07-20: 12 mg/h via INTRAVENOUS
  Administered 2015-07-20: 18 mg/h via INTRAVENOUS
  Administered 2015-07-20: 14 mg/h via INTRAVENOUS
  Administered 2015-07-20: 20 mg/h via INTRAVENOUS
  Filled 2015-07-17 (×14): qty 50
  Filled 2015-07-17: qty 100
  Filled 2015-07-17 (×3): qty 50

## 2015-07-17 MED ORDER — METOPROLOL TARTRATE 25 MG/10 ML ORAL SUSPENSION
50.0000 mg | Freq: Two times a day (BID) | ORAL | Status: DC
  Administered 2015-07-17 (×2): 50 mg
  Filled 2015-07-17 (×2): qty 20

## 2015-07-17 MED ORDER — GADOBENATE DIMEGLUMINE 529 MG/ML IV SOLN
20.0000 mL | Freq: Once | INTRAVENOUS | Status: AC | PRN
  Administered 2015-07-17: 20 mL via INTRAVENOUS

## 2015-07-17 MED ORDER — METHYLPREDNISOLONE SODIUM SUCC 40 MG IJ SOLR
40.0000 mg | Freq: Once | INTRAMUSCULAR | Status: AC
  Administered 2015-07-17: 40 mg via INTRAVENOUS
  Filled 2015-07-17: qty 1

## 2015-07-17 NOTE — Progress Notes (Signed)
PULMONARY / CRITICAL CARE MEDICINE   Name: Gwendolyn Morales MRN: [id number] DOB: [birth year]    ADMISSION DATE:  07/12/2015 CONSULTATION DATE:  07/13/2015  REFERRING MD:  Dr. [name]  CHIEF COMPLAINT:  Fall  HISTORY OF PRESENT ILLNESS:   50 yo female presented to ER after falling.  She had difficulty with speech and Rt sided weakness.  She had BP 280/150.  CT head showed 1.7 x 1.3 cm Lt parietal hemorrhage and 0.6 cm lateral pons hemorrhage.  NIH stroke score 10.  She was started on nicardipine for BP control, and admitted by stroke service.  She was noted to having snoring respiratory pattern, poor gag reflex, and intermittent hypoxia. Required intubation.  SUBJECTIVE:  Tolerating some PSV 5 this am but tachypneic in high 30's Minimal cuff leak this am (about 50cc)  VITAL SIGNS: BP 165/90 mmHg  Pulse 103  Temp(Src) 99.9 F (37.7 C) (Axillary)  Resp 25  Ht  (1.676 m)  Wt 115.8 kg (255 lb 4.7 oz)  BMI 41.22 kg/m2  SpO2 96%  HEMODYNAMICS:    VENTILATOR SETTINGS: Vent Mode:  [-] PSV;CPAP FiO2 (%):  [40 %] 40 % Set Rate:  [14 bmp] 14 bmp Vt Set:  [470 mL] 470 mL PEEP:  [5 cmH20] 5 cmH20 Pressure Support:  [5 cmH20-10 cmH20] 5 cmH20 Plateau Pressure:  [18 cmH20-22 cmH20] 19 cmH20  INTAKE / OUTPUT: I/O last 3 completed shifts: In: 5736 [I.V.:4496; NG/GT:1240] Out: 6225 [Urine:6225]  PHYSICAL EXAMINATION: General:  Awake and follows commands Neuro:  Follows commands, tracks and follows HEENT: No JVD, ETT-> vent Cardiovascular:  s1 s2 RRT no r Lungs:  Ronchi, decreased in bases Abdomen:  Soft, obese, NT,  Musculoskeletal:  No sig edema Skin:  No rash  LABS:  BMET  Recent Labs Lab 07/15/15 0550 07/16/15 0739 07/17/15 0300  NA 144 146* 145  K 3.1* 3.0* 3.5  CL 109 114* 111  CO2 BUN 17 22* 26*  CREATININE 0.81 0.78 0.68  GLUCOSE 113* 116* 138*    Electrolytes  Recent Labs Lab 07/14/15 1637 07/15/15 0550 07/16/15 0220 07/16/15 0739  07/17/15 0300  CALCIUM  --  8.9  --  8.4* 9.1  MG 1.9 1.8 1.8  --   --   PHOS 2.6 3.1 2.8  --   --     CBC  Recent Labs Lab 07/15/15 0550 07/16/15 0220 07/17/15 0300  WBC 13.2* 12.4* 14.7*  HGB 10.9* 9.8* 10.1*  HCT 37.5 32.5* 34.2*  PLT 303 311 336    Coag's  Recent Labs Lab 07/12/15 2317  APTT 30  INR 1.18    Sepsis Markers No results for input(s): LATICACIDVEN, PROCALCITON, O2SATVEN in the last 168 hours.  ABG  Recent Labs Lab 07/13/15 1901  PHART 7.349*  PCO2ART 45.1*  PO2ART 63.0*    Liver Enzymes  Recent Labs Lab 07/12/15 2317  AST 20  ALT 19  ALKPHOS 97  BILITOT 0.4  ALBUMIN 3.8    Cardiac Enzymes No results for input(s): TROPONINI, PROBNP in the last 168 hours.  Glucose  Recent Labs Lab 07/16/15 1124 07/16/15 1526 07/16/15 1926 07/16/15 2326 07/17/15 0319 07/17/15 0741  GLUCAP 116* 121* 157* 147* 137* 125*    Imaging Mr Lodema Pilot Contrast  07/17/2015  CLINICAL DATA:  50 year old hypertensive female noncompliant with treatment presenting with difficulty with speech or right-sided weakness. Blood pressure 280/150. On anti-platelet therapy. Subsequent encounter. EXAM: MRI HEAD WITHOUT AND WITH CONTRAST TECHNIQUE: Multiplanar, multiecho  pulse sequences of the brain and surrounding structures were obtained without and with intravenous contrast. CONTRAST:  20 cc MultiHance COMPARISON:  07/14/2015 head CT.  No comparison brain MR. FINDINGS: Exam is motion degraded. Posterior left frontal opercular 2 cm hematoma. Left upper pons 9 mm hematoma. These hemorrhage appear acute/ subacute. Scattered small chronic blood breakdown products posterior limb right internal capsule, right external capsule, posterior right opercular region, anterior right frontal lobe, left temporal lobe, right occipital lobe and cerebellum bilaterally. Intracranial hemorrhage most likely related to hypertension given the present clinical setting and surrounding findings.  Recommend follow-up until complete clearance. This would help exclude the much less likely consideration of underlying lesion as cause for acute hemorrhage. Small acute/subacute nonhemorrhagic infarcts frontal and parietal lobes bilaterally. Remote bilateral thalamic, bilateral centrum semiovale/ corona radiata, bilateral basal ganglia and right pontine infarcts. In addition to vasogenic edema surrounding the left frontal opercular hematoma, there are marked confluent white matter changes most consistent with result of chronic microvascular disease. Linear dural enhancement of indeterminate etiology described in several benign settings. Opacification petrous apex and partial opacification left mastoid air cells without obstructing lesion of the eustachian tube noted. Pooling of secretions posterior pharynx. No definitive findings to suggest petrous a bursitis. If the patient developed cranial nerve symptoms then this possibly would need to be considered. Major intracranial vascular structures are patent. Ectatic vertebral arteries and basilar artery. Mild exophthalmos. Cervical medullary junction unremarkable. Mild atrophy most notable involving the cerebellum. No hydrocephalus. Calcification of the falx. Additionally, plaque-like calcification left convexity which may represent dural calcification although en plaque meningioma not entirely excluded. IMPRESSION: Exam is motion degraded. Posterior left frontal opercular 2 cm hematoma. Left upper pons 9 mm hematoma. These hemorrhage appear acute/ subacute. Mild vasogenic edema surrounds left frontal lobe hematoma. Scattered small chronic blood breakdown products posterior limb right internal capsule, right external capsule, posterior right opercular region, anterior right frontal lobe, left temporal lobe, right occipital lobe and cerebellum bilaterally. Intracranial hemorrhage most likely related to hypertension given the present clinical setting and surrounding  findings. Recommend follow-up until complete clearance. Small acute/subacute nonhemorrhagic infarcts frontal and parietal lobes bilaterally. Remote bilateral thalamic, bilateral centrum semiovale/ corona radiata, bilateral basal ganglia and right pontine infarcts. Marked confluent white matter changes most consistent with result of chronic microvascular disease. Linear dural enhancement of indeterminate etiology described in several benign settings. Opacification petrous apex and partial opacification left mastoid air cells without obstructing lesion of the eustachian tube noted. No definitive findings to suggest petrous a bursitis. If the patient developed cranial nerve symptoms then this possibly would need to be considered. Please see above. Electronically Signed   By: Lacy DuverneySteven  Olson M.D.   On: 07/17/2015 08:05   Dg Chest Port 1 View  07/17/2015  CLINICAL DATA:  Acute respiratory failure, history hypertension EXAM: PORTABLE CHEST 1 VIEW COMPARISON:  Portable exam 0620 hours compared to 07/16/2015 FINDINGS: Tip of endotracheal tube projects 4.1 cm above carina. Nasogastric tube extends into stomach. Minimal enlargement of cardiac silhouette. Mediastinal contours and pulmonary vascularity normal. Persistent LEFT basilar opacity question atelectasis versus infiltrate. Lungs otherwise grossly clear. No pleural effusion or pneumothorax. IMPRESSION: Persistent patchy LEFT lower lobe opacity which could represent atelectasis or infiltrate, little changed. Electronically Signed   By: Ulyses SouthwardMark  Boles M.D.   On: 07/17/2015 08:07     STUDIES:  6/01 CT head >> 1.7 x 1.3 cm Lt parietal ICH, 0.6 cm Lt pons ICH, scattered small vessel ischemia 6/03 Echo >> LVEF  65%, grade 1 diastolic dysfxn MRI Brain 6/6 >> L frontal occipital hematoma 2cm, L upper pons 9mm, mild vasogenic edema.   CULTURES:  ANTIBIOTICS:  SIGNIFICANT EVENTS: 6/01 Admit 6/2- HTN, poor airway control, intubated 6/4 very awake  LINES/TUBES: 6/2  ett>>  DISCUSSION: 50 yo female with Lt parietal and Lt pontine hemorrhage in setting of HTN urgency.  Now with snoring respirations, poor gag reflex, and intermittent hypoxia. 6/2 intubated for airway protection. Concern for UA swelling and irritation. MS improved  ASSESSMENT / PLAN:  NEUROLOGIC A:   Acute Lt parietal and pontine hemorrhage P:   RASS goal: 0 Airway secured 6/2 with ETT neurochecks  Propofol for sedation Imaging per neuro > CT, MRI brain MS much improved  PULMONARY A: Acute hypoxic respiratory failure Upper airway, poor control secretions P:   Vent bundle F/u CXR Weanable but cord edema noted on intubation therefore airway is main issue. tachypneic on SBT am 6/6. Marginal cuff leak. Would like to repeat solumedrol x 1 and then attempt extubation 6/7 versus move to trach   CARDIOVASCULAR A:  HTN urgency. P:  Clevidipine to keep SBP < 160 Echo ef 65% g1df  RENAL  Recent Labs Lab 07/15/15 0550 07/16/15 0739 07/17/15 0300  K 3.1* 3.0* 3.5   A:   Hypokalemia. P:   Replace electrolytes as needed   GASTROINTESTINAL A:   Dysphagia prior to intubation  P:   NPO  OGT and feeds  HEMATOLOGIC A:   DVT prevention P:  SCDs for DVT prophylaxis Cbc in am   INFECTIOUS A:   No evidence for infection. P:   Monitor clinically, get pcxr post intubation Assess airway secretions upon intubation  ENDOCRINE CBG (last 3)   Recent Labs  07/16/15 2326 07/17/15 0319 07/17/15 0741  GLUCAP 147* 137* 125*     A:   Mild hyperglycemia.  P:     Monitor blood sugar on BMET   FAMILY  - Updates: updated her father at bedside 6/5  - Inter-disciplinary family meet or Palliative Care meeting due by: 6/9  Independent Cc time 34 minutes   Levy Pupa, MD, PhD 07/17/2015, 9:58 AM  Pulmonary and Critical Care [phone] or if no answer [phone]

## 2015-07-17 NOTE — Progress Notes (Signed)
Physical Therapy Treatment Patient Details Name: Gwendolyn Morales MRN: [id number] DOB: 1965/03/03 Today's Date: 07/17/2015    History of Present Illness 50 yo female with Lt parietal and Lt pontine hemorrhage in setting of HTN urgency. Now with snoring respirations, poor gag reflex, and intermittent hypoxia. 6/2 intubated for airway protection    PT Comments    Patient seen for exercise and therapeutic activity. Assist patient using mechanical features of bed to elevate to full chair position with dependent legs, BP stable. Performed session focused on BLE exercises. Noted active movement in bilateral LEs L>R. Assisted patient with education regarding therapeutic exercises and positioning. Patient receptive. VSS throughout session with saturations >94% on vent, RR 30s, and HR 110s. Spoke with nsg with goal of chair position for >1 hour. Will follow   Follow Up Recommendations  CIR;Supervision/Assistance - 24 hour     Equipment Recommendations  Other (comment) (TBD)    Recommendations for Other Services Rehab consult     Precautions / Restrictions Precautions Precautions: Fall Restrictions Weight Bearing Restrictions: No    Mobility  Bed Mobility               General bed mobility comments: utilized chair positioning in bed to bring to complete upright with LEs in dependent position for therapeutic exercises and activity  Transfers                 General transfer comment: not assessed today  Ambulation/Gait             General Gait Details: not assessed today   Stairs            Wheelchair Mobility    Modified Rankin (Stroke Patients Only) Modified Rankin (Stroke Patients Only) Pre-Morbid Rankin Score: No symptoms Modified Rankin: Severe disability     Balance     Sitting balance-Leahy Scale: Zero                              Cognition Arousal/Alertness: Awake/alert   Overall Cognitive Status: Difficult to assess Area  of Impairment: Attention;Following commands   Current Attention Level: Focused   Following Commands: Follows one step commands consistently            Exercises Other Exercises Other Exercises: Performed sitting abduction/adduction with pillow 3 sets x5 Other Exercises: performed Long arc Quads (decreased range on right) 2 sets 5 Other Exercises: performed leg press into footboard of bed 3 sets x5 with 5 second holds Other Exercises: heel raises x10 Other Exercises: ankle pumps x20    General Comments        Pertinent Vitals/Pain Pain Assessment: No/denies pain    Home Living                      Prior Function            PT Goals (current goals can now be found in the care plan section) Acute Rehab PT Goals Patient Stated Goal: none stated PT Goal Formulation: Patient unable to participate in goal setting Time For Goal Achievement: 07/30/15 Potential to Achieve Goals: Fair Progress towards PT goals: Progressing toward goals    Frequency  Min 4X/week    PT Plan Current plan remains appropriate    Co-evaluation             End of Session Equipment Utilized During Treatment: Gait belt;Oxygen (on ventilator) Activity Tolerance: Patient limited by fatigue Patient left:  in bed;with call bell/phone within reach in chair position, mitts re-applied     Time: 1140-1157 PT Time Calculation (min) (ACUTE ONLY): 17 min  Charges:  $Therapeutic Exercise: 8-22 mins                    G CodesFabio Asa:      Zo Loudon J 07/17/2015, 1:15 PM Charlotte Crumbevon Nari Vannatter, PT DPT  [phone]

## 2015-07-17 NOTE — Progress Notes (Signed)
Evaluated for cuff leak this morning.  A small cuff leak was heard.  VT loss of about 50 ml. RN aware.

## 2015-07-17 NOTE — Progress Notes (Signed)
RN called d/t pt w/ tachypnea, increased WOB.  MD at bedside to eval pt.  Per MD, no extubation today.  Pt placed back on full vent support for now. RN aware.

## 2015-07-17 NOTE — Progress Notes (Signed)
PS trial attempted X3 this am.  Pt unable to tolerate at this time due to increased RR >35 and increased WOB.  RT will try again later today as tolerated.

## 2015-07-17 NOTE — Progress Notes (Addendum)
STROKE TEAM PROGRESS NOTE   SUBJECTIVE (INTERVAL HISTORY) No family is at bedside. She is still on cleviprex drip as well as propofol. She is awake alert and following commands. MRI findings consistent with multifocal microbleeds and microinfarct, HTN vs. vasculitis.   OBJECTIVE Temp:  [99.3 F (37.4 C)-100.4 F (38 C)] 100.4 F (38 C) (06/06 1600) Pulse Rate:  [86-118] 105 (06/06 1900) Cardiac Rhythm:  [-] Sinus tachycardia (06/06 0800) Resp:  [20-42] 26 (06/06 1900) BP: (105-194)/(63-156) 156/84 mmHg (06/06 1900) SpO2:  [87 %-100 %] 92 % (06/06 1900) FiO2 (%):  [40 %] 40 % (06/06 1520) Weight:  [255 lb 4.7 oz (115.8 kg)] 255 lb 4.7 oz (115.8 kg) (06/06 0500)  CBC:   Recent Labs Lab 07/12/15 2317  07/16/15 0220 07/17/15 0300  WBC 11.3*  < > 12.4* 14.7*  NEUTROABS 9.2*  --   --   --   HGB 11.4*  < > 9.8* 10.1*  HCT 37.8  < > 32.5* 34.2*  MCV 72.1*  < > 73.0* 73.4*  PLT 359  < > 311 336  < > = values in this interval not displayed.  Basic Metabolic Panel:   Recent Labs Lab 07/15/15 0550 07/16/15 0220 07/16/15 0739 07/17/15 0300  NA 144  --  146* 145  K 3.1*  --  3.0* 3.5  CL 109  --  114* 111  CO2 25  --  27 26  GLUCOSE 113*  --  116* 138*  BUN 17  --  22* 26*  CREATININE 0.81  --  0.78 0.68  CALCIUM 8.9  --  8.4* 9.1  MG 1.8 1.8  --   --   PHOS 3.1 2.8  --   --     Lipid Panel:     Component Value Date/Time   CHOL 187 07/13/2015 1057   TRIG 134 07/16/2015 1012   HDL 60 07/13/2015 1057   CHOLHDL 3.1 07/13/2015 1057   VLDL 20 07/13/2015 1057   LDLCALC 107* 07/13/2015 1057   HgbA1c:  Lab Results  Component Value Date   HGBA1C 5.5 07/13/2015   Urine Drug Screen:     Component Value Date/Time   LABOPIA NONE DETECTED 07/16/2015 1414   COCAINSCRNUR NONE DETECTED 07/16/2015 1414   LABBENZ NONE DETECTED 07/16/2015 1414   AMPHETMU NONE DETECTED 07/16/2015 1414   THCU NONE DETECTED 07/16/2015 1414   LABBARB NONE DETECTED 07/16/2015 1414       IMAGING I have personally reviewed the radiological images below and agree with the radiology interpretations. Blue text is my interpretation  Ct Angio Head and neck W/cm &/or Wo Cm  07/13/2015  IMPRESSION: Stable acute small volume parenchymal hemorrhages as described in the LEFT pons and LEFT frontal operculum. No underlying extracranial or intracranial stenosis, vascular malformation or aneurysm. No evidence for venous sinus thrombosis. Extensive hypoattenuation of white matter, likely severe chronic microvascular ischemic change related to hypertension. 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  Ct Head Wo Contrast  07/14/2015  IMPRESSION: 1. Overall no significant interval change in left frontal opercular hemorrhage with mild localized edema. 2. Stable 6 mm left pontine hemorrhage without significant edema. 3. No other new acute intracranial process.   07/12/2015  IMPRESSION: 1. Acute hemorrhage at the left parietal lobe, measuring 1.7 x 1.3 cm. 2. Small focus of acute hemorrhage at the left side of the pons, measuring 0.6 cm. 3. Scattered small vessel ischemic microangiopathy and mild chronic ischemic  change at the basal ganglia bilaterally.  TTE -  - Left ventricle: The cavity size was normal. Wall thickness was  normal. Systolic function was normal. The estimated ejection  fraction was in the range of 60% to 65%. Wall motion was normal;  there were no regional wall motion abnormalities. Doppler  parameters are consistent with abnormal left ventricular  relaxation (grade 1 diastolic dysfunction). - Aortic valve: There was mild to moderate stenosis. Valve area  (VTI): 1.85 cm^2. Valve area (Vmax): 1.42 cm^2. Valve area  (Vmean): 1.39 cm^2. - Mitral valve: Severely calcified annulus. The findings are  consistent with mild stenosis. - Left atrium: The atrium was mildly dilated.  MRI brain -  Posterior left  frontal opercular 2 cm hematoma. Left upper pons 9 mm hematoma. These hemorrhage appear acute/ subacute. Mild vasogenic edema surrounds left frontal lobe hematoma.  Scattered small chronic blood breakdown products posterior limb right internal capsule, right external capsule, posterior right opercular region, anterior right frontal lobe, left temporal lobe, right occipital lobe and cerebellum bilaterally.  Intracranial hemorrhage most likely related to hypertension given the present clinical setting and surrounding findings. Recommend follow-up until complete clearance.  Small acute/subacute nonhemorrhagic infarcts frontal and parietal lobes bilaterally.  Remote bilateral thalamic, bilateral centrum semiovale/ corona radiata, bilateral basal ganglia and right pontine infarcts.  Marked confluent white matter changes most consistent with result of chronic microvascular disease.  Linear dural enhancement of indeterminate etiology described in several benign settings.  Opacification petrous apex and partial opacification left mastoid air cells without obstructing lesion of the eustachian tube noted. No definitive findings to suggest petrous a bursitis. If the patient developed cranial nerve symptoms then this possibly would need to be considered.  Autoimmune work up - pending  Renal A duplex - pending   PHYSICAL EXAM Obese middle aged Caucasian lady who is intubated and sedated on a ventilator. Febrile with low grade fever. Head is nontraumatic. Neck is supple. Cardiac exam no murmur or gallop.   Neurological Exam :  Patient is still intubated, not able to wean off due to lack of cuff leak. She is awake alert today and able to follows simple commands consistently. Pupils equal both reactive. Fundi were not visualized. B/l incomplete abduction. No nystagmus. LUE and LLE 5/5, RUE 4/5 and RLE 3/5. Right plantar upgoing left downgoing. Sensation, coordination and gait was not  tested.   ASSESSMENT/PLAN Ms. Jerre SimonSherry Febres is a 50 y.o. female with history of HTN and noncompliance presenting with speech difficulty and R sided weakness following a fall. BP elevated. CT showed a L parietal and small pontine hemorrhage could be due to malignant hypertension, however, CNS vasculitis is in DDx.   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 However, location of ICH is really atypical. 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  MRI multifocal microbleeds and microinfarcts, concerning for HTN vs. vasculitis  Consider LP or cerebral angio if indicated  2D Echo  EF 60-65%   SCDs and lovenox for VTE prophylaxis  No antithrombotic prior to admission  Ongoing aggressive stroke risk factor management  Therapy recommendations:  pending   Disposition:  pending   Hypertensive Emergency  BP 280/150 on arrival to hospital in setting of neuro symptoms  On cardene, but d/c  due to large volume  Put on cleviprex, wean off as able  Put on amlodipine, HCTZ and lisinopril   Add metoprolol  SBP goal < 160  Renal artery duplex - pending  Malignant HTN work up - pending  Other Stroke Risk Factors  Morbid Obesity, Body mass index is 41.22 kg/(m^2).   Other Active Problems  Leukocytosis, WBC 11.3, Check labs am  Hypokalemia, K 2.9, replaced with 4 runs. Check labs am  Hospital day # 5  This patient is critically ill due to multifocal ICH, respiratory failure, hypertensive emergency and at significant risk of neurological worsening, death form hematoma enlargement, seizure, heart failure. This patient's care requires constant monitoring of vital signs, hemodynamics,  respiratory and cardiac monitoring, review of multiple databases, neurological assessment, discussion with family, other specialists and medical decision making of high complexity. I spent 35 minutes of neurocritical care time in the care of this patient.  Marvel Plan, MD PhD Stroke Neurology 07/17/2015 7:21 PM   To contact Stroke Continuity provider, please refer to [email or web address]. After hours, contact General Neurology

## 2015-07-17 NOTE — Progress Notes (Signed)
Nutrition Follow-up  DOCUMENTATION CODES:   Morbid obesity  INTERVENTION:   Resume Vital High Protein @ 15 ml/hr with 60 ml Prostat TID Provides: 960 kcal, 121 grams protein, and 300 ml H2O.  Monitor for changes in Cleviprex and Propofol rates with possible extubation 6/7. TF adjustments as needed.   NUTRITION DIAGNOSIS:   Inadequate oral intake related to inability to eat as evidenced by NPO status. Ongoing.   GOAL:   Provide needs based on ASPEN/SCCM guidelines Progressing.   MONITOR:   Vent status, Labs, Weight trends, I & O's, TF tolerance  ASSESSMENT:   50 y/o female PHMx HTN presented after falling at home and afterwards haiving difficulty with speech+ R side weajnes. CT showed intraparenchymal hemorrhages. Intubated 6/2 due to poor airway protection. Nutrition consulted for TF.   Pt discussed during ICU rounds and with RN.  Per MD plan is to extubated tomorrow.   Temp (24hrs), Avg:99.8 F (37.7 C), Min:99.3 F (37.4 C), Max:99.9 F (37.7 C)  Propofol: 17.2 ml/hr provides: 424 kcal  Cleviprex: 42 ml/hr provides: 2016 kcal  Labs reviewed CBG's: 121-137 Vital High Protein infusing @ 40 ml/hr with 60 ml Prostat TID: 1560 kcal, 174 grams protein Pt receiving 4,030 kcal with cleviprex, propofol and nutrition  Diet Order:    NPO  Skin:  Reviewed, no issues  Last BM:  6/5  Height:   Ht Readings from Last 1 Encounters:  07/16/15 5\' 6"  (1.676 m)    Weight:   Wt Readings from Last 1 Encounters:  07/17/15 255 lb 4.7 oz (115.8 kg)    Ideal Body Weight:  59.1 kg  BMI:  Body mass index is 41.22 kg/(m^2).  Estimated Nutritional Needs:   Kcal:  1254-1596 (11-14 kcal/kg ABW)  Protein:  >118-140 g (2g/kw ibw  Fluid:  Per MD  EDUCATION NEEDS:   No education needs identified at this time  Kendell BaneHeather Havanna Groner RD, LDN, CNSC [phone] Pager [phone] After Hours Pager

## 2015-07-17 NOTE — Care Management Note (Signed)
Case Management Note  Patient Details  Name: Gwendolyn SimonSherry Morales MRN: [id number] Date of Birth: 01-02-1966  Subjective/Objective:  Pt admitted on 07/12/15 s/p ICH.  PTA, pt independent, lives with spouse and children.                    Action/Plan: Pt remains sedated and on ventilator.  Hopeful for extubation on 07/18/15.  Will follow for discharge planning as pt progresses.    Expected Discharge Date:                  Expected Discharge Plan:  IP Rehab Facility  In-House Referral:     Discharge planning Services  CM Consult  Post Acute Care Choice:    Choice offered to:     DME Arranged:    DME Agency:     HH Arranged:    HH Agency:     Status of Service:  In process, will continue to follow  Medicare Important Message Given:    Date Medicare IM Given:    Medicare IM give by:    Date Additional Medicare IM Given:    Additional Medicare Important Message give by:     If discussed at Long Length of Stay Meetings, dates discussed:    Additional Comments:  Quintella BatonJulie W. Tiearra Colwell, RN, BSN  Trauma/Neuro ICU Case Manager [phone]

## 2015-07-18 ENCOUNTER — Inpatient Hospital Stay (HOSPITAL_COMMUNITY): Payer: Self-pay

## 2015-07-18 DIAGNOSIS — N280 Ischemia and infarction of kidney: Secondary | ICD-10-CM

## 2015-07-18 DIAGNOSIS — I10 Essential (primary) hypertension: Secondary | ICD-10-CM

## 2015-07-18 LAB — CBC
HCT: 34.8 % — ABNORMAL LOW (ref 36.0–46.0)
HEMOGLOBIN: 10.2 g/dL — AB (ref 12.0–15.0)
MCH: 21.5 pg — AB (ref 26.0–34.0)
MCHC: 29.3 g/dL — ABNORMAL LOW (ref 30.0–36.0)
MCV: 73.4 fL — ABNORMAL LOW (ref 78.0–100.0)
Platelets: 406 10*3/uL — ABNORMAL HIGH (ref 150–400)
RBC: 4.74 MIL/uL (ref 3.87–5.11)
RDW: 20.1 % — ABNORMAL HIGH (ref 11.5–15.5)
WBC: 16.7 10*3/uL — AB (ref 4.0–10.5)

## 2015-07-18 LAB — BASIC METABOLIC PANEL
ANION GAP: 11 (ref 5–15)
BUN: 30 mg/dL — AB (ref 6–20)
CHLORIDE: 110 mmol/L (ref 101–111)
CO2: 27 mmol/L (ref 22–32)
Calcium: 9 mg/dL (ref 8.9–10.3)
Creatinine, Ser: 0.81 mg/dL (ref 0.44–1.00)
GFR calc Af Amer: >60 mL/min (ref >60)
Glucose, Bld: 126 mg/dL — ABNORMAL HIGH (ref 65–99)
POTASSIUM: 2.7 mmol/L — AB (ref 3.5–5.1)
SODIUM: 148 mmol/L — AB (ref 135–145)

## 2015-07-18 LAB — GLUCOSE, CAPILLARY
GLUCOSE-CAPILLARY: 119 mg/dL — AB (ref 65–99)
GLUCOSE-CAPILLARY: 115 mg/dL — AB (ref 65–99)
GLUCOSE-CAPILLARY: 148 mg/dL — AB (ref 65–99)
Glucose-Capillary: 144 mg/dL — ABNORMAL HIGH (ref 65–99)
Glucose-Capillary: 117 mg/dL — ABNORMAL HIGH (ref 65–99)
Glucose-Capillary: 122 mg/dL — ABNORMAL HIGH (ref 65–99)

## 2015-07-18 LAB — TRIGLYCERIDES: Triglycerides: 280 mg/dL — ABNORMAL HIGH (ref <150)

## 2015-07-18 MED ORDER — FREE WATER
200.0000 mL | Freq: Three times a day (TID) | Status: DC
  Administered 2015-07-18 – 2015-07-19 (×3): 200 mL

## 2015-07-18 MED ORDER — METOPROLOL TARTRATE 25 MG/10 ML ORAL SUSPENSION
75.0000 mg | Freq: Two times a day (BID) | ORAL | Status: DC
  Administered 2015-07-18 – 2015-07-19 (×4): 75 mg
  Filled 2015-07-18 (×5): qty 30

## 2015-07-18 MED ORDER — GADOBENATE DIMEGLUMINE 529 MG/ML IV SOLN
20.0000 mL | Freq: Once | INTRAVENOUS | Status: AC | PRN
  Administered 2015-07-17: 20 mL via INTRAVENOUS

## 2015-07-18 MED ORDER — VANCOMYCIN HCL IN DEXTROSE 1-5 GM/200ML-% IV SOLN
1000.0000 mg | Freq: Two times a day (BID) | INTRAVENOUS | Status: DC
  Administered 2015-07-19 – 2015-07-20 (×5): 1000 mg via INTRAVENOUS
  Filled 2015-07-18 (×6): qty 200

## 2015-07-18 MED ORDER — PIPERACILLIN-TAZOBACTAM 3.375 G IVPB
3.3750 g | Freq: Three times a day (TID) | INTRAVENOUS | Status: DC
  Administered 2015-07-18 – 2015-07-24 (×18): 3.375 g via INTRAVENOUS
  Filled 2015-07-18 (×19): qty 50

## 2015-07-18 MED ORDER — HYDRALAZINE HCL 50 MG PO TABS
50.0000 mg | ORAL_TABLET | Freq: Three times a day (TID) | ORAL | Status: DC
  Administered 2015-07-18 – 2015-07-24 (×17): 50 mg via ORAL
  Filled 2015-07-18 (×17): qty 1

## 2015-07-18 MED ORDER — HYDRALAZINE HCL 25 MG PO TABS
25.0000 mg | ORAL_TABLET | Freq: Three times a day (TID) | ORAL | Status: DC
Start: 2015-07-18 — End: 2015-07-18
  Administered 2015-07-18: 25 mg via ORAL
  Filled 2015-07-18: qty 1

## 2015-07-18 MED ORDER — POTASSIUM CHLORIDE 20 MEQ PO PACK
40.0000 meq | PACK | ORAL | Status: AC
  Administered 2015-07-18 (×2): 40 meq
  Filled 2015-07-18 (×4): qty 2

## 2015-07-18 MED ORDER — PIPERACILLIN-TAZOBACTAM 3.375 G IVPB 30 MIN
3.3750 g | Freq: Once | INTRAVENOUS | Status: AC
  Administered 2015-07-18: 3.375 g via INTRAVENOUS
  Filled 2015-07-18: qty 50

## 2015-07-18 MED ORDER — SODIUM CHLORIDE 0.9 % IV SOLN
2000.0000 mg | Freq: Once | INTRAVENOUS | Status: AC
  Administered 2015-07-18: 2000 mg via INTRAVENOUS
  Filled 2015-07-18: qty 2000

## 2015-07-18 NOTE — Progress Notes (Signed)
*  PRELIMINARY RESULTS* Vascular Ultrasound Renal Artery Duplex has been completed.  Preliminary findings: Right kidney is asymmetrically smaller than left and has hyperechoic appearance. Minimal arterial flow obtained in right renal artery and intrarenal flow, which is noted to be atypical. No apparent renal artery stenosis on the left.    Farrel DemarkJill Eunice, RDMS, RVT  07/18/2015, 1:33 PM

## 2015-07-18 NOTE — Progress Notes (Addendum)
PULMONARY / CRITICAL CARE MEDICINE   Name: Gwendolyn Morales MRN: [id number] DOB: 05/03/1965    ADMISSION DATE:  07/12/2015 CONSULTATION DATE:  07/13/2015  REFERRING MD:  Dr. [name]  CHIEF COMPLAINT:  Fall  HISTORY OF PRESENT ILLNESS:   50 yo female presented to ER after falling.  She had difficulty with speech and Rt sided weakness.  She had BP 280/150.  CT head showed 1.7 x 1.3 cm Lt parietal hemorrhage and 0.6 cm lateral pons hemorrhage.  NIH stroke score 10.  She was started on nicardipine for BP control, and admitted by stroke service.  She was noted to having snoring respiratory pattern, poor gag reflex, and intermittent hypoxia. Required intubation.  SUBJECTIVE:  Desaturation overnight, currently on 0.50 Increased secretions this am  VITAL SIGNS: BP 150/87 mmHg  Pulse 101  Temp(Src) 100 F (37.8 C) (Oral)  Resp 24  Ht  (1.676 m)  Wt 117 kg (257 lb 15 oz)  BMI 41.65 kg/m2  SpO2 94%  HEMODYNAMICS:    VENTILATOR SETTINGS: Vent Mode:  [-] PRVC FiO2 (%):  [40 %-50 %] 50 % Set Rate:  [14 bmp] 14 bmp Vt Set:  [470 mL] 470 mL PEEP:  [5 cmH20] 5 cmH20 Plateau Pressure:  [17 cmH20-21 cmH20] 17 cmH20  INTAKE / OUTPUT: I/O last 3 completed shifts: In: 4418.7 [I.V.:3598.7; NG/GT:820] Out: 5475 [Urine:5475]  PHYSICAL EXAMINATION: General:  Awake and follows commands Neuro:  Follows commands, tracks and follows HEENT: No JVD, ETT-> vent Cardiovascular:  s1 s2 RRT no r Lungs:  Ronchi, decreased in bases Abdomen:  Soft, obese, NT,  Musculoskeletal:  No sig edema Skin:  No rash  LABS:  BMET  Recent Labs Lab 07/16/15 0739 07/17/15 0300 07/18/15 0618  NA 146* 145 148*  K 3.0* 3.5 2.7*  CL 114* 111 110  CO2 BUN 22* 26* 30*  CREATININE 0.78 0.68 0.81  GLUCOSE 116* 138* 126*    Electrolytes  Recent Labs Lab 07/14/15 1637 07/15/15 0550 07/16/15 0220 07/16/15 0739 07/17/15 0300 07/18/15 0618  CALCIUM  --  8.9  --  8.4* 9.1 9.0  MG 1.9 1.8 1.8   --   --   --   PHOS 2.6 3.1 2.8  --   --   --     CBC  Recent Labs Lab 07/16/15 0220 07/17/15 0300 07/18/15 0618  WBC 12.4* 14.7* 16.7*  HGB 9.8* 10.1* 10.2*  HCT 32.5* 34.2* 34.8*  PLT 311 336 406*    Coag's  Recent Labs Lab 07/12/15 2317  APTT 30  INR 1.18    Sepsis Markers No results for input(s): LATICACIDVEN, PROCALCITON, O2SATVEN in the last 168 hours.  ABG  Recent Labs Lab 07/13/15 1901  PHART 7.349*  PCO2ART 45.1*  PO2ART 63.0*    Liver Enzymes  Recent Labs Lab 07/12/15 2317  AST 20  ALT 19  ALKPHOS 97  BILITOT 0.4  ALBUMIN 3.8    Cardiac Enzymes No results for input(s): TROPONINI, PROBNP in the last 168 hours.  Glucose  Recent Labs Lab 07/17/15 1112 07/17/15 1534 07/17/15 1928 07/17/15 2310 07/18/15 0318 07/18/15 0809  GLUCAP 121* 130* 124* 159* 119* 115*    Imaging No results found.   STUDIES:  6/01 CT head >> 1.7 x 1.3 cm Lt parietal ICH, 0.6 cm Lt pons ICH, scattered small vessel ischemia 6/03 Echo >> LVEF 65%, grade 1 diastolic dysfxn MRI Brain 6/6 >> L frontal occipital hematoma 2cm, L upper pons 9mm, mild  vasogenic edema.   CULTURES:  ANTIBIOTICS:  SIGNIFICANT EVENTS: 6/01 Admit 6/2- HTN, poor airway control, intubated 6/4 very awake  LINES/TUBES: 6/2 ett>>  DISCUSSION: 50 yo female with Lt parietal and Lt pontine hemorrhage in setting of HTN urgency.  Now with snoring respirations, poor gag reflex, and intermittent hypoxia. 6/2 intubated for airway protection. Concern for UA swelling and irritation. MS improved  ASSESSMENT / PLAN:  NEUROLOGIC A:   Acute Lt parietal and pontine hemorrhage P:   RASS goal: 0 Airway secured 6/2 with ETT neurochecks  Propofol for sedation Imaging per neuro > CT, MRI brain MS much improved  PULMONARY A: Acute hypoxic respiratory failure Upper airway, poor control secretions P:   Vent bundle F/u CXR Given her slow progress, secretions and UA edema on intubation I  believe the most straightforward course here would be to pursue trach. Will discuss w family.    CARDIOVASCULAR A:  HTN urgency. P:  Clevidipine to keep SBP < 160 Echo ef 65% g1df  RENAL  Recent Labs Lab 07/16/15 0739 07/17/15 0300 07/18/15 0618  K 3.0* 3.5 2.7*   A:   Hypokalemia. P:   Replace electrolytes as needed   GASTROINTESTINAL A:   Dysphagia prior to intubation  P:   NPO  OGT and feeds  HEMATOLOGIC A:   DVT prevention P:  SCDs for DVT prophylaxis Cbc in am   INFECTIOUS A:   New LLL infiltrate, possible VAP / HCAP P:   Start vanco + pip/tazo 6/7  ENDOCRINE CBG (last 3)   Recent Labs  07/17/15 2310 07/18/15 0318 07/18/15 0809  GLUCAP 159* 119* 115*   A:   Mild hyperglycemia.  P:     Monitor blood sugar on BMET   FAMILY  - Updates: updated her father at bedside 6/5  - Inter-disciplinary family meet or Palliative Care meeting due by: 6/9  Independent Cc time 34 minutes   Levy Pupaobert Wandalene Abrams, MD, PhD 07/18/2015, 11:02 AM [facility] [phone] or if no answer [phone]

## 2015-07-18 NOTE — Progress Notes (Signed)
eLink Physician-Brief Progress Note Patient Name: Gwendolyn SimonSherry Morales DOB: 07-23-1965 MRN: [id number]   Date of Service  07/18/2015  HPI/Events of Note  rn wanting goal sats Mild desat  eICU Interventions  Goal 92$ To increase to 50%     Intervention Category Intermediate Interventions: Respiratory distress - evaluation and management  Navya Timmons J. 07/18/2015, 1:14 AM

## 2015-07-18 NOTE — Progress Notes (Signed)
Pharmacy Antibiotic Note  Gwendolyn Morales is a 50 y.o. female admitted on 07/12/2015 with ICH.  Pt now showing LLL infiltrate, will begin abx for VAP vs HCAP. SCr 0.8, eCrCl > 80 ml/min.  Plan: -Vancomycin 2 g IV x1 then 1g/12h -Zosyn 3.375 g IV q8h -Monitor renal fx, cultures, VT at Css, duration of therapy  Height: 5\' 6"  (167.6 cm) Weight: 257 lb 15 oz (117 kg) IBW/kg (Calculated) : 59.3  Temp (24hrs), Avg:100.3 F (37.9 C), Min:99.8 F (37.7 C), Max:101.1 F (38.4 C)   Recent Labs Lab 07/14/15 0355 07/15/15 0550 07/16/15 0220 07/16/15 0739 07/17/15 0300 07/18/15 0618  WBC 12.6* 13.2* 12.4*  --  14.7* 16.7*  CREATININE 0.92 0.81  --  0.78 0.68 0.81    Estimated Creatinine Clearance: 109.3 mL/min (by C-G formula based on Cr of 0.81).    No Known Allergies  Antimicrobials this admission: 6/7 vanc > 6/7 zosyn >  Dose adjustments this admission: NA  Microbiology results:  6/5 blood cx: 6/7 blood cx: 6/7 mrsa: neg  Thank you for allowing pharmacy to be a part of this patient's care.  Baldemar FridayMasters, Obera Stauch M 07/18/2015 11:17 AM

## 2015-07-18 NOTE — Progress Notes (Signed)
STROKE TEAM PROGRESS NOTE   SUBJECTIVE (INTERVAL HISTORY) No family is at bedside. She is still on cleviprex drip as well as propofol. She had fever overnight and reported to have copious secretions from ET tube. Progressive pneumonia by CXR. Not able to extubate today. Renal A duplex showed right renal artery lack of blood flow.   OBJECTIVE Temp:  [99.8 F (37.7 C)-101.1 F (38.4 C)] 100.8 F (38.2 C) (06/07 1138) Pulse Rate:  [95-117] 109 (06/07 1550) Cardiac Rhythm:  [-] Sinus tachycardia (06/07 0800) Resp:  [20-39] 26 (06/07 1550) BP: (134-179)/(75-152) 150/83 mmHg (06/07 1400) SpO2:  [85 %-99 %] 98 % (06/07 1550) FiO2 (%):  [40 %-50 %] 50 % (06/07 1550) Weight:  [257 lb 15 oz (117 kg)] 257 lb 15 oz (117 kg) (06/07 0500)  CBC:   Recent Labs Lab 07/12/15 2317  07/17/15 0300 07/18/15 0618  WBC 11.3*  < > 14.7* 16.7*  NEUTROABS 9.2*  --   --   --   HGB 11.4*  < > 10.1* 10.2*  HCT 37.8  < > 34.2* 34.8*  MCV 72.1*  < > 73.4* 73.4*  PLT 359  < > 336 406*  < > = values in this interval not displayed.  Basic Metabolic Panel:   Recent Labs Lab 07/15/15 0550 07/16/15 0220  07/17/15 0300 07/18/15 0618  NA 144  --   < > 145 148*  K 3.1*  --   < > 3.5 2.7*  CL 109  --   < > 111 110  CO2 25  --   < > 26 27  GLUCOSE 113*  --   < > 138* 126*  BUN 17  --   < > 26* 30*  CREATININE 0.81  --   < > 0.68 0.81  CALCIUM 8.9  --   < > 9.1 9.0  MG 1.8 1.8  --   --   --   PHOS 3.1 2.8  --   --   --   < > = values in this interval not displayed.  Lipid Panel:     Component Value Date/Time   CHOL 187 07/13/2015 1057   TRIG 280* 07/18/2015 0618   HDL 60 07/13/2015 1057   CHOLHDL 3.1 07/13/2015 1057   VLDL 20 07/13/2015 1057   LDLCALC 107* 07/13/2015 1057   HgbA1c:  Lab Results  Component Value Date   HGBA1C 5.5 07/13/2015   Urine Drug Screen:     Component Value Date/Time   LABOPIA NONE DETECTED 07/16/2015 1414   COCAINSCRNUR NONE DETECTED 07/16/2015 1414   LABBENZ NONE  DETECTED 07/16/2015 1414   AMPHETMU NONE DETECTED 07/16/2015 1414   THCU NONE DETECTED 07/16/2015 1414   LABBARB NONE DETECTED 07/16/2015 1414      IMAGING I have personally reviewed the radiological images below and agree with the radiology interpretations. Blue text is my interpretation  Ct Angio Head and neck W/cm &/or Wo Cm  07/13/2015  IMPRESSION: Stable acute small volume parenchymal hemorrhages as described in the LEFT pons and LEFT frontal operculum. No underlying extracranial or intracranial stenosis, vascular malformation or aneurysm. No evidence for venous sinus thrombosis. Extensive hypoattenuation of white matter, likely severe chronic microvascular ischemic change related to hypertension. 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  Ct Head Wo Contrast  07/14/2015  IMPRESSION: 1. Overall no significant interval change in left frontal opercular hemorrhage with mild localized edema. 2. Stable 6 mm left pontine  hemorrhage without significant edema. 3. No other new acute intracranial process.   07/12/2015  IMPRESSION: 1. Acute hemorrhage at the left parietal lobe, measuring 1.7 x 1.3 cm. 2. Small focus of acute hemorrhage at the left side of the pons, measuring 0.6 cm. 3. Scattered small vessel ischemic microangiopathy and mild chronic ischemic change at the basal ganglia bilaterally.  TTE -  - Left ventricle: The cavity size was normal. Wall thickness was  normal. Systolic function was normal. The estimated ejection  fraction was in the range of 60% to 65%. Wall motion was normal;  there were no regional wall motion abnormalities. Doppler  parameters are consistent with abnormal left ventricular  relaxation (grade 1 diastolic dysfunction). - Aortic valve: There was mild to moderate stenosis. Valve area  (VTI): 1.85 cm^2. Valve area (Vmax): 1.42 cm^2. Valve area  (Vmean): 1.39 cm^2. - Mitral valve: Severely  calcified annulus. The findings are  consistent with mild stenosis. - Left atrium: The atrium was mildly dilated.  MRI brain -  Posterior left frontal opercular 2 cm hematoma. Left upper pons 9 mm hematoma. These hemorrhage appear acute/ subacute. Mild vasogenic edema surrounds left frontal lobe hematoma.  Scattered small chronic blood breakdown products posterior limb right internal capsule, right external capsule, posterior right opercular region, anterior right frontal lobe, left temporal lobe, right occipital lobe and cerebellum bilaterally.  Intracranial hemorrhage most likely related to hypertension given the present clinical setting and surrounding findings. Recommend follow-up until complete clearance.  Small acute/subacute nonhemorrhagic infarcts frontal and parietal lobes bilaterally.  Remote bilateral thalamic, bilateral centrum semiovale/ corona radiata, bilateral basal ganglia and right pontine infarcts.  Marked confluent white matter changes most consistent with result of chronic microvascular disease.  Linear dural enhancement of indeterminate etiology described in several benign settings.  Opacification petrous apex and partial opacification left mastoid air cells without obstructing lesion of the eustachian tube noted. No definitive findings to suggest petrous a bursitis. If the patient developed cranial nerve symptoms then this possibly would need to be considered.  Autoimmune work up - elevated ESR, CRP and RF, others negative.  Renal A duplex - Right kidney is asymmetrically smaller than left and has hyperechoic appearance. Minimal arterial flow obtained in right renal artery and intrarenal flow, which is noted to be atypical. No apparent renal artery stenosis on the left.   PHYSICAL EXAM Obese middle aged 21 lady who is intubated and sedated on a ventilator. Febrile with low grade fever. Head is nontraumatic. Neck is supple. Cardiac exam  no murmur or gallop.   Neurological Exam :  Patient is still intubated, not able to wean off due to lack of copious secretion and fever. She is awake alert today and able to follows simple commands consistently. Pupils equal both reactive. Fundi were not visualized. B/l incomplete abduction. No nystagmus. LUE and LLE 4/5, RUE 3/5 and RLE 2+/5. Right plantar upgoing left downgoing. Sensation, coordination and gait was not tested.   ASSESSMENT/PLAN [name] is a 50 y.o. female with history of HTN and noncompliance presenting with speech difficulty and R sided weakness following a fall. BP elevated. CT showed a L parietal and small pontine hemorrhage could be due to malignant hypertension, however, CNS vasculitis is in DDx.   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 However, location of ICH is really atypical. 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  MRI multifocal microbleeds and microinfarcts, concerning for HTN vs. Vasculitis  Consider LP or cerebral angio if indicated  2D Echo  EF 60-65%   SCDs and lovenox for VTE prophylaxis  No antithrombotic prior to admission  Ongoing aggressive stroke risk factor management  Therapy recommendations:  pending   Disposition:  pending   Hypertensive Emergency  BP 280/150 on arrival to hospital in setting of neuro symptoms  On cardene, but d/c due to large volume  Put on cleviprex, wean off as able  Put on amlodipine, HCTZ and lisinopril   Add metoprolol and hydralazine  SBP goal < 160  Renal artery duplex - right renal artery lack of flow - consider renal consult  Malignant HTN work up - pending  Pneumonia / respiratory  failure  Intubated and on vent  CCM on board  Febrile   Blood culture sent  On zosyn and vanco  Leukocytosis 11.3->14.7->16.7  Copious scecretions  Not able to extubate at this time.   Other Stroke Risk Factors  Morbid Obesity, Body mass index is 41.65 kg/(m^2).   Other Active Problems   Hypokalemia - supplement  Hospital day # 6  This patient is critically ill due to multifocal ICH, respiratory failure, hypertensive emergency and at significant risk of neurological worsening, death form hematoma enlargement, seizure, heart failure. This patient's care requires constant monitoring of vital signs, hemodynamics, respiratory and cardiac monitoring, review of multiple databases, neurological assessment, discussion with family, other specialists and medical decision making of high complexity. I spent 40 minutes of neurocritical care time in the care of this patient.  Rosalin Hawking, MD PhD Stroke Neurology 07/18/2015 5:22 PM   To contact Stroke Continuity provider, please refer to [email or web address] After hours, contact General Neurology

## 2015-07-18 NOTE — Progress Notes (Addendum)
Occupational Therapy Treatment Patient Details Name: Gwendolyn SimonSherry Foree MRN: [id number] DOB: 01/20/1966 Today's Date: 07/18/2015    History of present illness 50 yo female with Lt parietal and Lt pontine hemorrhage in setting of HTN urgency. Now with snoring respirations, poor gag reflex, and intermittent hypoxia. 6/2 intubated for airway protection   OT comments  Pt able to participate in grooming activities at bed level with max assist this session. Pt tolerating AAROM to bil UEs; L with more active movement than R. Pt arousing to verbal stimulus and following one step commands to participate activities in session today; ~20 minutes into session pt more alert and interacting with therapist. Tolerating chair position well; SpO2 in low 90s at start of session with HOB ~30 degrees, SpO2=95 at end of session with bed in chair position. D/c plan remains appropriate. Will continue to follow acutely.   Follow Up Recommendations  CIR    Equipment Recommendations  Other (comment) (TBD at next venue)    Recommendations for Other Services      Precautions / Restrictions Precautions Precautions: Fall Restrictions Weight Bearing Restrictions: No       Mobility Bed Mobility               General bed mobility comments: Utilized chair position for upright positioning in bed.  Transfers                 General transfer comment: Not assessed at this time.    Balance                                   ADL Overall ADL's : Needs assistance/impaired     Grooming: Maximal assistance;Wash/dry hands;Wash/dry face;Bed level Grooming Details (indicate cue type and reason): Pt able to grip washcloth with L hand and initiate bring to face/R hand. Assist provided to wipe eyes and to bring R hand to midline for washing. Pt able to apply lotion to RUE with max assist.                               General ADL Comments: Pt able to arouse to verbal stimulation and  name calling. Pt able to follow one step commands to complete bed level grooming activities, UE ROM, and strengthening. L UE with more active movement than R UE. SpO2 91-92 at start of session on vent, at end of session SpO2=95. ~20 min into session with removal of pt from sedation; pt became more alert and interactive with therapist, better engaging in activities and able to communicate through head nods. Positioned pt in chair position at end of session and positioned UEs with hands elevated to reduce edema.      Vision                     Perception     Praxis      Cognition     Overall Cognitive Status: Difficult to assess Area of Impairment: Attention;Following commands   Current Attention Level: Focused    Following Commands: Follows one step commands consistently;Follows one step commands with increased time            Extremity/Trunk Assessment               Exercises Other Exercises Other Exercises: Bil AROM shoulder shrugs and rolls x 5. Other Exercises: Bil AAROM shoulder flex/ext  x 5 R, x 10 L. Other Exercises: Bil grip and thumbs up x 5. Other Exercises: Bil AAROM elbow flex/ext x 10.   Shoulder Instructions       General Comments      Pertinent Vitals/ Pain       Pain Assessment: No/denies pain  Home Living                                          Prior Functioning/Environment              Frequency Min 3X/week     Progress Toward Goals  OT Goals(current goals can now be found in the care plan section)  Progress towards OT goals: Progressing toward goals  Acute Rehab OT Goals Patient Stated Goal: none stated  Plan Discharge plan remains appropriate    Co-evaluation                 End of Session     Activity Tolerance Patient tolerated treatment well   Patient Left in bed;with call bell/phone within reach;with restraints reapplied   Nurse Communication          Time: 4098-1191 OT Time  Calculation (min): 39 min  Charges: OT General Charges $OT Visit: 1 Procedure OT Treatments $Self Care/Home Management : 8-22 mins $Therapeutic Exercise: 23-37 mins  Gaye Alken M.S., OTR/L Pager: [phone]  07/18/2015, 3:52 PM

## 2015-07-19 ENCOUNTER — Inpatient Hospital Stay (HOSPITAL_COMMUNITY): Payer: Self-pay

## 2015-07-19 DIAGNOSIS — R509 Fever, unspecified: Secondary | ICD-10-CM

## 2015-07-19 LAB — TRIGLYCERIDES: TRIGLYCERIDES: 149 mg/dL (ref <150)

## 2015-07-19 LAB — GLUCOSE, CAPILLARY
GLUCOSE-CAPILLARY: 122 mg/dL — AB (ref 65–99)
GLUCOSE-CAPILLARY: 104 mg/dL — AB (ref 65–99)
GLUCOSE-CAPILLARY: 130 mg/dL — AB (ref 65–99)
GLUCOSE-CAPILLARY: 136 mg/dL — AB (ref 65–99)
Glucose-Capillary: 108 mg/dL — ABNORMAL HIGH (ref 65–99)
Glucose-Capillary: 165 mg/dL — ABNORMAL HIGH (ref 65–99)

## 2015-07-19 LAB — BASIC METABOLIC PANEL
ANION GAP: 10 (ref 5–15)
Anion gap: 7 (ref 5–15)
BUN: 27 mg/dL — AB (ref 6–20)
BUN: 23 mg/dL — ABNORMAL HIGH (ref 6–20)
CALCIUM: 8.8 mg/dL — AB (ref 8.9–10.3)
CHLORIDE: 114 mmol/L — AB (ref 101–111)
CO2: 26 mmol/L (ref 22–32)
CO2: 26 mmol/L (ref 22–32)
Calcium: 8.8 mg/dL — ABNORMAL LOW (ref 8.9–10.3)
Chloride: 109 mmol/L (ref 101–111)
Creatinine, Ser: 0.83 mg/dL (ref 0.44–1.00)
Creatinine, Ser: 0.91 mg/dL (ref 0.44–1.00)
GFR calc Af Amer: >60 mL/min (ref >60)
GFR calc non Af Amer: >60 mL/min (ref >60)
GLUCOSE: 117 mg/dL — AB (ref 65–99)
Glucose, Bld: 107 mg/dL — ABNORMAL HIGH (ref 65–99)
POTASSIUM: 2.7 mmol/L — AB (ref 3.5–5.1)
Potassium: 3.2 mmol/L — ABNORMAL LOW (ref 3.5–5.1)
SODIUM: 147 mmol/L — AB (ref 135–145)
SODIUM: 145 mmol/L (ref 135–145)

## 2015-07-19 MED ORDER — POTASSIUM CHLORIDE 20 MEQ PO PACK
40.0000 meq | PACK | ORAL | Status: AC
  Administered 2015-07-19 (×2): 40 meq via ORAL
  Filled 2015-07-19 (×2): qty 2

## 2015-07-19 MED ORDER — FREE WATER
300.0000 mL | Freq: Four times a day (QID) | Status: DC
  Administered 2015-07-19: 300 mL

## 2015-07-19 MED ORDER — FREE WATER
400.0000 mL | Status: DC
  Administered 2015-07-19 – 2015-07-24 (×28): 400 mL

## 2015-07-19 MED ORDER — POTASSIUM CHLORIDE 10 MEQ/50ML IV SOLN: 10.0000 meq | INTRAVENOUS | Status: DC

## 2015-07-19 MED ORDER — ACETAMINOPHEN 160 MG/5ML PO SOLN
650.0000 mg | ORAL | Status: DC | PRN
  Administered 2015-07-19 – 2015-07-22 (×7): 650 mg
  Filled 2015-07-19 (×7): qty 20.3

## 2015-07-19 MED ORDER — POTASSIUM CHLORIDE 10 MEQ/100ML IV SOLN
INTRAVENOUS | Status: AC
  Administered 2015-07-19: 10 meq
  Filled 2015-07-19: qty 100

## 2015-07-19 MED ORDER — PROPOFOL 1000 MG/100ML IV EMUL
5.0000 ug/kg/min | INTRAVENOUS | Status: DC
  Administered 2015-07-19: 50 ug/kg/min via INTRAVENOUS
  Administered 2015-07-19: 20 ug/kg/min via INTRAVENOUS
  Administered 2015-07-19: 40 ug/kg/min via INTRAVENOUS
  Administered 2015-07-19: 20 ug/kg/min via INTRAVENOUS
  Administered 2015-07-20: 25 ug/kg/min via INTRAVENOUS
  Administered 2015-07-20: 50 ug/kg/min via INTRAVENOUS
  Administered 2015-07-20: 25 ug/kg/min via INTRAVENOUS
  Administered 2015-07-21: 20 ug/kg/min via INTRAVENOUS
  Filled 2015-07-19 (×9): qty 100

## 2015-07-19 MED ORDER — FUROSEMIDE 10 MG/ML IJ SOLN
60.0000 mg | Freq: Once | INTRAMUSCULAR | Status: AC
  Administered 2015-07-19: 60 mg via INTRAVENOUS
  Filled 2015-07-19: qty 6

## 2015-07-19 MED ORDER — MAGNESIUM SULFATE 2 GM/50ML IV SOLN
2.0000 g | Freq: Once | INTRAVENOUS | Status: AC
  Administered 2015-07-19: 2 g via INTRAVENOUS
  Filled 2015-07-19: qty 50

## 2015-07-19 MED ORDER — POTASSIUM CHLORIDE 10 MEQ/100ML IV SOLN
10.0000 meq | INTRAVENOUS | Status: AC
  Administered 2015-07-19 (×4): 10 meq via INTRAVENOUS
  Filled 2015-07-19 (×5): qty 100

## 2015-07-19 NOTE — Progress Notes (Signed)
PULMONARY / CRITICAL CARE MEDICINE   Name: Gwendolyn Morales MRN: [id number] DOB: 05/21/1965    ADMISSION DATE:  07/12/2015 CONSULTATION DATE:  07/13/2015  REFERRING MD:  Dr. [name]  CHIEF COMPLAINT:  Fall  HISTORY OF PRESENT ILLNESS:   50 yo female presented to ER after falling.  She had difficulty with speech and Rt sided weakness.  She had BP 280/150.  CT head showed 1.7 x 1.3 cm Lt parietal hemorrhage and 0.6 cm lateral pons hemorrhage.  NIH stroke score 10.  She was started on nicardipine for BP control, and admitted by stroke service.  She was noted to having snoring respiratory pattern, poor gag reflex, and intermittent hypoxia. Required intubation.  SUBJECTIVE:  Awake, tachypneic, trying to communicate Uncomfortable from ETT  VITAL SIGNS: BP 111/91 mmHg  Pulse 105  Temp(Src) 100.3 F (37.9 C) (Axillary)  Resp 29  Ht  (1.676 m)  Wt 117.2 kg (258 lb 6.1 oz)  BMI 41.72 kg/m2  SpO2 97%  HEMODYNAMICS:    VENTILATOR SETTINGS: Vent Mode:  [-] PRVC FiO2 (%):  [40 %-50 %] 40 % Set Rate:  [14 bmp] 14 bmp Vt Set:  [470 mL] 470 mL PEEP:  [5 cmH20] 5 cmH20 Pressure Support:  [15 cmH20] 15 cmH20 Plateau Pressure:  [12 cmH20-19 cmH20] 12 cmH20  INTAKE / OUTPUT: I/O last 3 completed shifts: In: 3974.7 [I.V.:3669.7; NG/GT:255; IV Piggyback:50] Out: 5950 [Urine:5950]  PHYSICAL EXAMINATION: General:  Awake, appears uncomfortable Neuro:  Follows commands, tracks and follows HEENT: No JVD, ETT-> vent Cardiovascular:  s1 s2 RRT no r Lungs:  Ronchi, decreased in bases Abdomen:  Soft, obese, NT,  Musculoskeletal:  No sig edema Skin:  No rash  LABS:  BMET  Recent Labs Lab 07/17/15 0300 07/18/15 0618 07/19/15 0315  NA 145 148* 147*  K 3.5 2.7* 2.7*  CL 111 110 114*  CO2 BUN 26* 30* 27*  CREATININE 0.68 0.81 0.83  GLUCOSE 138* 126* 117*    Electrolytes  Recent Labs Lab 07/14/15 1637 07/15/15 0550 07/16/15 0220  07/17/15 0300 07/18/15 0618  07/19/15 0315  CALCIUM  --  8.9  --   < > 9.1 9.0 8.8*  MG 1.9 1.8 1.8  --   --   --   --   PHOS 2.6 3.1 2.8  --   --   --   --   < > = values in this interval not displayed.  CBC  Recent Labs Lab 07/16/15 0220 07/17/15 0300 07/18/15 0618  WBC 12.4* 14.7* 16.7*  HGB 9.8* 10.1* 10.2*  HCT 32.5* 34.2* 34.8*  PLT 311 336 406*    Coag's  Recent Labs Lab 07/12/15 2317  APTT 30  INR 1.18    Sepsis Markers No results for input(s): LATICACIDVEN, PROCALCITON, O2SATVEN in the last 168 hours.  ABG  Recent Labs Lab 07/13/15 1901  PHART 7.349*  PCO2ART 45.1*  PO2ART 63.0*    Liver Enzymes  Recent Labs Lab 07/12/15 2317  AST 20  ALT 19  ALKPHOS 97  BILITOT 0.4  ALBUMIN 3.8    Cardiac Enzymes No results for input(s): TROPONINI, PROBNP in the last 168 hours.  Glucose  Recent Labs Lab 07/18/15 1136 07/18/15 1618 07/18/15 1909 07/18/15 2316 07/19/15 0309 07/19/15 0731  GLUCAP 144* 117* 122* 148* 122* 108*    Imaging Dg Chest Port 1 View  07/19/2015  CLINICAL DATA:  Acute respiratory failure EXAM: PORTABLE CHEST 1 VIEW COMPARISON:  07/18/2015 FINDINGS: Tubular devices  are stable. Left lower lobe consolidation is stable. Right lung is under aerated and clear. No pneumothorax. IMPRESSION: Stable left lower lobe pneumonia. Electronically Signed   By: Jolaine ClickArthur  Hoss M.D.   On: 07/19/2015 08:12   Dg Chest Port 1 View  07/18/2015  CLINICAL DATA:  F/u to resp failure - pt has an et tube EXAM: PORTABLE CHEST - 1 VIEW COMPARISON:  07/17/2015 FINDINGS: Progressive airspace consolidation in the left mid and lower lung, obscuring the left diaphragmatic leaflet. Right lung remains clear. Heart size upper limits some normal for technique. Can't exclude  adjacent left pleural effusion.  No pneumothorax. Endotracheal tube and nasogastric tube stable in position. Visualized bones unremarkable. IMPRESSION: 1. Progressive left mid and lower lung airspace  consolidation. 2.  Support  hardware stable in position. Electronically Signed   By: Corlis Leak  Hassell M.D.   On: 07/18/2015 11:16     STUDIES:  6/01 CT head >> 1.7 x 1.3 cm Lt parietal ICH, 0.6 cm Lt pons ICH, scattered small vessel ischemia 6/03 Echo >> LVEF 65%, grade 1 diastolic dysfxn MRI Brain 6/6 >> L frontal occipital hematoma 2cm, L upper pons 9mm, mild vasogenic edema.   CULTURES:  ANTIBIOTICS:  SIGNIFICANT EVENTS: 6/01 Admit 6/2- HTN, poor airway control, intubated 6/4 very awake  LINES/TUBES: 6/2 ett>>  DISCUSSION: 50 yo female with Lt parietal and Lt pontine hemorrhage in setting of HTN urgency.  Now with snoring respirations, poor gag reflex, and intermittent hypoxia. 6/2 intubated for airway protection. Concern for UA swelling and irritation. MS improved  ASSESSMENT / PLAN:  NEUROLOGIC A:   Acute Lt parietal and pontine hemorrhage P:   RASS goal: 0 to -1 Airway secured 6/2 with ETT neurochecks  Propofol for sedation Imaging per neuro > CT, MRI brain MS much improved  PULMONARY A: Acute hypoxic respiratory failure Upper airway, poor control secretions P:   Vent bundle F/u CXR Given her slow progress, secretions and UA edema on intubation I believe the most straightforward course here would be to pursue trach. Will discuss w family on 6/8.    CARDIOVASCULAR A:  HTN urgency. P:  Clevidipine to keep SBP < 160 Amlodipine 10, hydralazine scheduled, lisinopril, metop 75 bid,  Echo ef 65% g1df  RENAL  Recent Labs Lab 07/17/15 0300 07/18/15 0618 07/19/15 0315  K 3.5 2.7* 2.7*   A:   Hypokalemia. P:   Replace electrolytes as needed   GASTROINTESTINAL A:   Dysphagia prior to intubation  P:   NPO  OGT and feeds  HEMATOLOGIC A:   DVT prevention P:  SCDs for DVT prophylaxis Cbc in am   INFECTIOUS A:   New LLL infiltrate, possible VAP / HCAP P:   Start vanco + pip/tazo 6/7, follow CXR and cx data  ENDOCRINE CBG (last 3)   Recent Labs  07/18/15 2316  07/19/15 0309 07/19/15 0731  GLUCAP 148* 122* 108*   A:   Mild hyperglycemia.  P:     Monitor blood sugar on BMET   FAMILY  - Updates: updated her father at bedside 6/5  - Inter-disciplinary family meet or Palliative Care meeting due by: 6/9  Independent Cc time 35 minutes   Levy Pupaobert Jhoselin Crume, MD, PhD 07/19/2015, 9:35 AM [facility] [phone] or if no answer [phone]

## 2015-07-19 NOTE — Consult Note (Signed)
Reason for Consult: Hypertension management Referring Physician: Rosalin Hawking M.D. (Neurology)  HPI: The patient is intubated and without any family members around-history is pieced together from electronic medical record.  50 year old Caucasian woman with past medical history significant for hypertension and nonadherence. Brought to the emergency room 6 days ago after falling at home and thereafter having difficulties with speech and right-sided weakness-CT scan of her head showed 1.7 x 1.3 cm acute left parietal hemorrhage as well as 6 mm hemorrhage of the left lateral pons. Her blood pressure was markedly elevated at 280/150 per EMS. Follow-up MRI 2 days ago showed left frontal/2 cm hematoma, left upper pons 9 mm hematoma and mild vasogenic edema surrounding left frontal lobe. A review of her home medication list does not show any antihypertensive medication use.  It appears that she had some difficulty being weaned off clevidipine and is on amlodipine 10 mg daily (started 6/2) , hydralazine 50 mg 3 times a day (started 6/7), HCTZ 25 mg daily (started 6/3), lisinopril 20 mg twice a day (started 6/5) and metoprolol 75 mg twice a day (started 6/6). Concern was raised with her renal artery Doppler that showed a right kidney asymmetrically smaller than the left with hyperechoic appearance and minimal arterial flow/intrarenal flow. No RAS on the left. Systolic blood pressures have ranged from 62-563 and diastolic pressures have ranged 52-116. Plans in place for possible tracheostomy later anticipating long wean from ventilator.  Past Medical History  Diagnosis Date  . Hypertension     History reviewed. No pertinent past surgical history.  History reviewed. No pertinent family history.  Social History:  reports that she has never smoked. She does not have any smokeless tobacco history on file. She reports that she does not drink alcohol or use illicit drugs.  Allergies: No Known  Allergies  Medications:  Scheduled: . amLODipine  10 mg Per Tube Daily  . antiseptic oral rinse  7 mL Mouth Rinse 10 times per day  . chlorhexidine gluconate (SAGE KIT)  15 mL Mouth Rinse BID  . enoxaparin (LOVENOX) injection  40 mg Subcutaneous Q24H  . feeding supplement (PRO-STAT SUGAR FREE 64)  60 mL Per Tube TID  . feeding supplement (VITAL HIGH PROTEIN)  1,000 mL Per Tube Q24H  . free water  300 mL Per Tube Q6H  . hydrALAZINE  50 mg Oral Q8H  . hydrochlorothiazide  25 mg Per Tube Daily  . lisinopril  20 mg Oral BID  . metoprolol tartrate  75 mg Per Tube BID  . pantoprazole sodium  40 mg Per Tube Q24H  . piperacillin-tazobactam (ZOSYN)  IV  3.375 g Intravenous Q8H  . potassium chloride  40 mEq Oral Q4H  . vancomycin  1,000 mg Intravenous Q12H    BMP Latest Ref Rng 07/19/2015 07/18/2015 07/17/2015  Glucose 65 - 99 mg/dL 117(H) 126(H) 138(H)  BUN 6 - 20 mg/dL 27(H) 30(H) 26(H)  Creatinine 0.44 - 1.00 mg/dL 0.83 0.81 0.68  Sodium 135 - 145 mmol/L 147(H) 148(H) 145  Potassium 3.5 - 5.1 mmol/L 2.7(LL) 2.7(LL) 3.5  Chloride 101 - 111 mmol/L 114(H) 110 111  CO2 22 - 32 mmol/L _0 Calcium 8.9 - 10.3 mg/dL 8.8(L) 9.0 9.1   CBC Latest Ref Rng 07/18/2015 07/17/2015 07/16/2015  WBC 4.0 - 10.5 K/uL 16.7(H) 14.7(H) 12.4(H)  Hemoglobin 12.0 - 15.0 g/dL 10.2(L) 10.1(L) 9.8(L)  Hematocrit 36.0 - 46.0 % 34.8(L) 34.2(L) 32.5(L)  Platelets 150 - 400 K/uL 406(H) 336 311  Dg Chest Port 1 View  07/19/2015  CLINICAL DATA:  Acute respiratory failure EXAM: PORTABLE CHEST 1 VIEW COMPARISON:  07/18/2015 FINDINGS: Tubular devices are stable. Left lower lobe consolidation is stable. Right lung is under aerated and clear. No pneumothorax. IMPRESSION: Stable left lower lobe pneumonia. Electronically Signed   By: Marybelle Killings M.D.   On: 07/19/2015 08:12   Dg Chest Port 1 View  07/18/2015  CLINICAL DATA:  F/u to resp failure - pt has an et tube EXAM: PORTABLE CHEST - 1 VIEW COMPARISON:  07/17/2015 FINDINGS:  Progressive airspace consolidation in the left mid and lower lung, obscuring the left diaphragmatic leaflet. Right lung remains clear. Heart size upper limits some normal for technique. Can't exclude  adjacent left pleural effusion.  No pneumothorax. Endotracheal tube and nasogastric tube stable in position. Visualized bones unremarkable. IMPRESSION: 1. Progressive left mid and lower lung airspace  consolidation. 2.  Support hardware stable in position. Electronically Signed   By: Lucrezia Europe M.D.   On: 07/18/2015 11:16    Review of Systems  Unable to perform ROS: intubated   Blood pressure 140/75, pulse 98, temperature 102.7 F (39.3 C), temperature source Axillary, resp. rate 37, height _0  (1.676 m), weight 117.2 kg (258 lb 6.1 oz), SpO2 98 %. Physical Exam  Nursing note and vitals reviewed. Constitutional: She appears well-developed and well-nourished.  Intubated/sedated  HENT:  Head: Normocephalic and atraumatic.  Nose: Nose normal.  Eyes: Pupils are equal, round, and reactive to light.  Neck: No JVD present.  Cardiovascular: Normal rate, regular rhythm and normal heart sounds.   No murmur heard. Respiratory: She has no wheezes. She has no rales.  Coarse breath sounds bilaterally  GI: Soft. Bowel sounds are normal. She exhibits no distension. There is no tenderness. There is no rebound.  Musculoskeletal: She exhibits edema.  Trace LE edema with 1+ edema over back  Skin: Skin is warm and dry. No erythema.    Assessment/Plan: 1. Hypertension: Overall with improving control with initiation of multi targeted antihypertensive therapy. Her renal artery stenosis and ipsilateral renal atrophy is likely a chronic phenomenon and unlikely to be contributing to her current uncontrolled blood pressures. I suspect that the dominant component in elevating her blood pressures at this time is her intracranial hemorrhage coupled with prior history of poor compliance. Continue on current management  especially with recently started hydralazine and anticipated duration to maximal effect of ACE inhibitor/calcium channel blocker. 2. Hypernatremia: Secondary to free water deficit, she is on free water flushes via NG tube and now on thiazide. We'll add volume/frequency of free water flushes as her deficit is around 3 L. 3. Hypokalemia: Most likely secondary to thiazide use-replace via oral and intravenous routes. 4. Intracranial hemorrhage: Noted to have a typical location of [place] and concerns raised regarding CNS vasculitis-workup and management per neurology.  Navarre Diana K. 07/19/2015, 1:03 PM

## 2015-07-19 NOTE — Progress Notes (Signed)
Occupational Therapy Treatment Patient Details Name: Gwendolyn SimonSherry Morales MRN: [id number] DOB: 06/25/1965 Today's Date: 07/19/2015    History of present illness 50 yo female with Lt parietal and Lt pontine hemorrhage in setting of HTN urgency. Now with snoring respirations, poor gag reflex, and intermittent hypoxia. 6/2 intubated for airway protection   OT comments  Pt tolerated sitting EOB and participated in grooming and LE/UE activities while sitting EOB today. Required total posterior support for sitting EOB but was able to engage trunk with activity. Mod assist provided for grooming. Performed retrograde massage to R hand and positioned with R hand elevated at end of session. D/c plan remains appropriate. Will continue to follow acutely.   Follow Up Recommendations  CIR    Equipment Recommendations  Other (comment) (TBD at next venue)    Recommendations for Other Services      Precautions / Restrictions Precautions Precautions: Fall Restrictions Weight Bearing Restrictions: No       Mobility Bed Mobility Overal bed mobility: Needs Assistance;+2 for physical assistance Bed Mobility: Rolling;Supine to Sit;Sit to Supine Rolling: Total assist;+2 for physical assistance   Supine to sit: Max assist;+2 for physical assistance;+2 for safety/equipment Sit to supine: Total assist;+2 for physical assistance;+2 for safety/equipment   General bed mobility comments: Max assist via helicopter technique to come to EOB. Patient with some LE movement to EOB with multi-modal cues. Max assist of 2 persons for elevation of trunk to upright position with posterior support at EOB. Total assist to return to supine.  Transfers                 General transfer comment: not assessed today    Balance Overall balance assessment: Needs assistance Sitting-balance support: Feet supported Sitting balance-Leahy Scale: Zero Sitting balance - Comments: full posterior support provided. Some noted  cervical control                           ADL Overall ADL's : Needs assistance/impaired     Grooming: Wash/dry face;Moderate assistance;Sitting Grooming Details (indicate cue type and reason): Hand over hand assist to complete task but pt able to initiate with washcloth in L hand.     Lower Body Bathing: Total assistance;+2 for physical assistance;Bed level Lower Body Bathing Details (indicate cue type and reason): Total assist to clean pt after BM.                       General ADL Comments: Pt tolerated sitting EOB with total posterior assist for ~7 minutes. In sitting pt able to participate in grooming activity and LUE ROM/strengthening (shoulder flex/ext, elbow flex/ext). BP in supine 161/98, sitting EOB 171/128. Max HR up to 111.       Vision                     Perception     Praxis      Cognition     Overall Cognitive Status: Difficult to assess Area of Impairment: Attention;Following commands   Current Attention Level: Focused    Following Commands: Follows one step commands consistently            Extremity/Trunk Assessment               Exercises Other Exercises Other Exercises: Trunk control activities, pull to flexion with bilateral UE support Other Exercises: Retrograde massage to hand and positioned in elevation. Other Exercises: L shoulder flex/ext AROM x 5.  Other Exercises: attempted LAQs able to perform limited range in LLE; RLE only able to contract, no noted elevation   Shoulder Instructions       General Comments      Pertinent Vitals/ Pain       Pain Assessment: No/denies pain  Home Living                                          Prior Functioning/Environment              Frequency Min 3X/week     Progress Toward Goals  OT Goals(current goals can now be found in the care plan section)  Progress towards OT goals: Progressing toward goals  Acute Rehab OT Goals Patient  Stated Goal: none stated  Plan Discharge plan remains appropriate    Co-evaluation    PT/OT/SLP Co-Evaluation/Treatment: Yes Reason for Co-Treatment: Complexity of the patient's impairments (multi-system involvement) PT goals addressed during session: Mobility/safety with mobility OT goals addressed during session: Other (comment);Strengthening/ROM (functional mobility)      End of Session Equipment Utilized During Treatment: Oxygen   Activity Tolerance Patient tolerated treatment well   Patient Left in bed;with call bell/phone within reach;with bed alarm set   Nurse Communication Other (comment) (RN present for peri care)        Time: 4098-1191 OT Time Calculation (min): 31 min  Charges: OT General Charges $OT Visit: 1 Procedure OT Treatments $Therapeutic Activity: 8-22 mins  Gaye Alken M.S., OTR/L Pager: [phone]  07/19/2015, 3:47 PM

## 2015-07-19 NOTE — Progress Notes (Addendum)
Physical Therapy Treatment Patient Details Name: Gwendolyn Morales MRN: [id number] DOB: [birth year] Today's Date: 07/19/2015    History of Present Illness 50 yo female with Lt parietal and Lt pontine hemorrhage in setting of HTN urgency. Now with snoring respirations, poor gag reflex, and intermittent hypoxia. 6/2 intubated for airway protection    PT Comments    Patient seen for EOB activity and exercise in conjunction with OT. Patient tolerated increased time EOB and is beginning to elicit some ability to engage trunk during activity. Performed both LE and UE activities EOB. Patient overall continues to required +2 physical assist for all aspects of function.  OF NOTE: Patient in [place] rhythm throughout session, nsg aware. BP elevated 170s/100s during session. Saturations on ventilator remained stable >97% but patient noted to have copious secretions, nsg in room for suctioning prn. Will continue to see and progress as tolerated.  Follow Up Recommendations  CIR;Supervision/Assistance - 24 hour     Equipment Recommendations  Other (comment) (TBD)    Recommendations for Other Services Rehab consult     Precautions / Restrictions Precautions Precautions: Fall Restrictions Weight Bearing Restrictions: No    Mobility  Bed Mobility Overal bed mobility: Needs Assistance;+2 for physical assistance Bed Mobility: Rolling;Supine to Sit;Sit to Supine Rolling: Total assist;+2 for physical assistance   Supine to sit: Max assist;+2 for physical assistance;+2 for safety/equipment Sit to supine: Total assist;+2 for physical assistance;+2 for safety/equipment   General bed mobility comments: Max assist via helicopter technique to come to EOB. Patient with some LE movement to EOB with multi-modal cues. Max assist of 2 persons for elevation of trunk to upright position with posterior support at EOB. Total assist to return to supine.  Transfers                 General transfer comment:  not assessed today  Ambulation/Gait             General Gait Details: not assessed today   Stairs            Wheelchair Mobility    Modified Rankin (Stroke Patients Only) Modified Rankin (Stroke Patients Only) Pre-Morbid Rankin Score: No symptoms Modified Rankin: Severe disability     Balance Overall balance assessment: Needs assistance Sitting-balance support: Feet supported Sitting balance-Leahy Scale: Zero Sitting balance - Comments: full posterior support provided. Some noted cervical control                            Cognition Arousal/Alertness: Awake/alert   Overall Cognitive Status: Difficult to assess Area of Impairment: Attention;Following commands   Current Attention Level: Focused   Following Commands: Follows one step commands consistently            Exercises Other Exercises Other Exercises: Trunk control activities, pull to flexion with bilateral UE support Other Exercises: toe taps in sitting Other Exercises: ankle ROM Other Exercises: attempted LAQs able to perform limited range in LLE; RLE only able to contract, no noted elevation    General Comments General comments (skin integrity, edema, etc.): patient tolerated EOB activity with assist      Pertinent Vitals/Pain Pain Assessment: No/denies pain    Home Living                      Prior Function            PT Goals (current goals can now be found in the care plan section) Acute Rehab  PT Goals Patient Stated Goal: none stated PT Goal Formulation: Patient unable to participate in goal setting Time For Goal Achievement: 07/30/15 Potential to Achieve Goals: Fair Progress towards PT goals: Progressing toward goals    Frequency  Min 3X/week    PT Plan Current plan remains appropriate    Co-evaluation PT/OT/SLP Co-Evaluation/Treatment: Yes Reason for Co-Treatment: Complexity of the patient's impairments (multi-system involvement) PT goals addressed  during session: Mobility/safety with mobility       End of Session Equipment Utilized During Treatment: Gait belt;Oxygen (on ventilator) Activity Tolerance: Patient limited by fatigue Patient left: in bed;with call bell/phone within reach     Time: 1040-1111 PT Time Calculation (min) (ACUTE ONLY): 31 min  Charges:  $Therapeutic Activity: 8-22 mins                    G CodesFabio Asa:      Breylen Agyeman J 07/19/2015, 1:59 PM Charlotte Crumbevon Quanesha Klimaszewski, PT DPT  [phone]

## 2015-07-19 NOTE — Consult Note (Signed)
Physical Medicine and Rehabilitation Consult  Reason for Consult: Right sided weakness, slurred speech,  Referring Physician: Dr. Erlinda Hong.    HPI: Gwendolyn Morales is a 50 y.o. female with history of morbid obesity, HTN and medical non-compliance who was admitted on 07/12/15 a fall at home with subsequent right sided weakness and speech difficulty. History taken from chart review. BP at admission markedly elevated at 280/150 and she was started on cardene drip for control. CT head done showing acute hemorrhage 1.7 cm X 1.3 cm at left parietal lobe, small focus of acute hemorrhage left pons and scattered small vessel microangiopathy.  She was noted to have intermittent hypoxia with sonorous respirations and was intubated for airway protection. CTA head/neck showed stable hemorrhage, no stenosis, AVM or aneurysm, and advanced dolichoectasia. MRI brain revealed posterior left frontal opercular hematoma with mild vasogenic edema, left pontine hematoma,  acute/ subacute infarcts in bilateral frontal and parietal lobes and scattered chronic micro- hemorrhages.  Dr. 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 To consider LP or cerebral angio if indicated.   Patient continues to have copious oral secretions with UE edema. She was started on IV Vanc/Zosyn for treatment of aspiration PNA.  Blood pressure difficulty to control and renal ultrasound done revealing minimal flow in right RA with smaller right kidney. 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 Blood pressures improving therefore they signed off 06/09.  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  She was weaned to ATC on 06/10 and vasculitis panel with elevated ESR, CRP and RF otherwise negative. She continues to have issues with  anxiety requiring propofol.  Has had reports of epigastric pain with jaundice and was found to have elevated bilirubin of 4.1. Therapy has been working with patient while on vent and CIR recommend for follow up therapy.   Per ST-was able to tolerate PMSV trials for total of 15 minutes this am.  Review of Systems  Unable to perform ROS: language      Past Medical History  Diagnosis Date  . Hypertension    History reviewed. No pertinent past surgical history.    History reviewed. No pertinent family history.    Social History:   Married?  Independent without AD and unemployed? She indicated that that she has never smoked and does not drink alcohol or use illicit drugs.    Allergies: No Known Allergies    Medications Prior to Admission  Medication Sig Dispense Refill  . diphenhydrAMINE (BENADRYL) 25 MG tablet Take 25 mg by mouth every 6 (six) hours as needed for allergies.    Marland Kitchen ibuprofen (ADVIL,MOTRIN) 400 MG tablet Take 400 mg by mouth every 6 (six) hours as needed for mild pain.    . Multiple Vitamin (MULTIVITAMIN WITH MINERALS) TABS tablet Take 1 tablet by mouth daily.      Home: Home Living Family/patient expects to be discharged to:: Inpatient rehab Living Arrangements: Spouse/significant other, Children Available Help at Discharge: Family Type of Home: House Home Access: Level entry Home Layout: One level Bathroom Shower/Tub: Tub/shower unit Home Equipment: None  Lives With: Spouse  Functional History: Prior Function Level of Independence: Independent Functional Status:  Mobility: Bed Mobility Overal bed mobility: Needs Assistance, +2 for physical assistance Bed Mobility: Rolling, Supine to Sit, Sit to  Supine Rolling: Total assist, +2 for physical assistance Supine to sit: Max assist, +2 for physical assistance, +2 for safety/equipment Sit to supine: Total assist, +2 for physical assistance, +2 for safety/equipment General bed mobility comments: Max assist via  helicopter technique to come to EOB. Patient with some LE movement to EOB with multi-modal cues. Max assist of 2 persons for elevation of trunk to upright position with posterior support at EOB. Total assist to return to supine. Transfers General transfer comment: not assessed today Ambulation/Gait General Gait Details: not assessed today    ADL: ADL Overall ADL's : Needs assistance/impaired Grooming: Wash/dry face, Moderate assistance, Sitting Grooming Details (indicate cue type and reason): Hand over hand assist to complete task but pt able to initiate with washcloth in L hand. Lower Body Bathing: Total assistance, +2 for physical assistance, Bed level Lower Body Bathing Details (indicate cue type and reason): Total assist to clean pt after BM. General ADL Comments: Pt tolerated sitting EOB with total posterior assist for ~7 minutes. In sitting pt able to participate in grooming activity and LUE ROM/strengthening (shoulder flex/ext, elbow flex/ext). BP in supine 161/98, sitting EOB 171/128. Max HR up to 111.   Cognition: Cognition Overall Cognitive Status: Difficult to assess Arousal/Alertness: Awake/alert Orientation Level: Oriented X4 Attention: Sustained Sustained Attention: Impaired Sustained Attention Impairment: Verbal basic, Functional basic Memory:  (appears fuctional informally) Awareness: Impaired Awareness Impairment: Emergent impairment, Anticipatory impairment Problem Solving:  (TBA further) Behaviors:  (borderline labile) Safety/Judgment: Appears intact Cognition Arousal/Alertness: Awake/alert Overall Cognitive Status: Difficult to assess Area of Impairment: Attention, Following commands Current Attention Level: Focused Following Commands: Follows one step commands consistently Difficult to assess due to: Impaired communication, Intubated   Blood pressure 122/85, pulse 92, temperature 99.8 F (37.7 C), temperature source Rectal, resp. rate 30, height _0   (1.676 m), weight 115.9 kg (255 lb 8.2 oz), SpO2 98 %. Physical Exam  Nursing note and vitals reviewed. Constitutional: She appears well-developed and well-nourished.  Obese female  HENT:  Head: Normocephalic and atraumatic.  Mouth/Throat: Oropharynx is clear and moist.  Eyes: Conjunctivae and EOM are normal. Pupils are equal, round, and reactive to light. Scleral icterus is present.  Neck:  Cuffed #6 trach in place.   Cardiovascular: Normal rate and regular rhythm.   Murmur heard. Respiratory: Effort normal. No stridor. She has rhonchi. She exhibits no tenderness.  GI: Soft. Bowel sounds are normal. She exhibits distension. There is tenderness.  + NGT  Musculoskeletal: She exhibits edema. She exhibits no tenderness.  Moderate edema left forearm and hand.   Neurological: She is alert.  Anxious appearing and unable to phonate with trach occluded--some anxiety noted. ?apraxia.   Slow to respond but was able to nod appropriately for  basic Y/N questions with occasional cues.  Motor: Bilateral upper extremities 4+/5 proximal to distal Bilateral lower extremities: Hip flexion, knee extension 2+/5, ankle dorsi/plantarflexion 3+/5 DTRs symmetric  Skin: Skin is warm and dry. No rash noted.  Psychiatric: Her mood appears anxious. She is slowed.    Results for orders placed or performed during the hospital encounter of 07/12/15 (from the past 24 hour(s))  Triglycerides     Status: Abnormal   Collection Time: 07/22/15 10:05 AM  Result Value Ref Range   Triglycerides 525 (H) <150 mg/dL  CBC     Status: Abnormal   Collection Time: 07/23/15  3:51 AM  Result Value Ref Range   WBC 7.5 4.0 - 10.5 K/uL   RBC 4.68 3.87 - 5.11 MIL/uL  Hemoglobin 10.3 (L) 12.0 - 15.0 g/dL   HCT 34.0 (L) 36.0 - 46.0 %   MCV 72.6 (L) 78.0 - 100.0 fL   MCH 22.0 (L) 26.0 - 34.0 pg   MCHC 30.3 30.0 - 36.0 g/dL   RDW 19.4 (H) 11.5 - 15.5 %   Platelets 330 150 - 400 K/uL  Basic metabolic panel     Status: Abnormal     Collection Time: 07/23/15  3:51 AM  Result Value Ref Range   Sodium 137 135 - 145 mmol/L   Potassium 3.4 (L) 3.5 - 5.1 mmol/L   Chloride 105 101 - 111 mmol/L   CO2 24 22 - 32 mmol/L   Glucose, Bld 109 (H) 65 - 99 mg/dL   BUN 19 6 - 20 mg/dL   Creatinine, Ser 0.75 0.44 - 1.00 mg/dL   Calcium 8.8 (L) 8.9 - 10.3 mg/dL   GFR calc non Af Amer >60 >60 mL/min   GFR calc Af Amer >60 >60 mL/min   Anion gap 8 5 - 15  Basic metabolic panel     Status: Abnormal   Collection Time: 07/23/15  7:13 AM  Result Value Ref Range   Sodium 135 135 - 145 mmol/L   Potassium 3.2 (L) 3.5 - 5.1 mmol/L   Chloride 103 101 - 111 mmol/L   CO2 23 22 - 32 mmol/L   Glucose, Bld 116 (H) 65 - 99 mg/dL   BUN 22 (H) 6 - 20 mg/dL   Creatinine, Ser 0.73 0.44 - 1.00 mg/dL   Calcium 8.8 (L) 8.9 - 10.3 mg/dL   GFR calc non Af Amer >60 >60 mL/min   GFR calc Af Amer >60 >60 mL/min   Anion gap 9 5 - 15   Dg Chest Port 1 View  07/23/2015  CLINICAL DATA:  Productive cough. EXAM: PORTABLE CHEST 1 VIEW COMPARISON:  July 21, 2015. FINDINGS: Stable cardiomediastinal silhouette. Tracheostomy tube in grossly good position. No pneumothorax or pleural effusion is noted. Minimal bibasilar subsegmental atelectasis is noted. Both lungs are clear. The visualized skeletal structures are unremarkable. IMPRESSION: Minimal bibasilar subsegmental atelectasis. Electronically Signed   By: Marijo Conception, M.D.   On: 07/23/2015 07:45    Assessment/Plan: Diagnosis: posterior left frontal opercular hematoma Labs and images independently reviewed.  Records reviewed and summated above. Stroke: Continue secondary stroke prophylaxis and Risk Factor Modification listed below:   Blood Pressure Management:  Continue current medication with prn's with permisive HTN per primary team ?Right sided hemiparesis:  Motor recovery: Fluoxetine  1. Does the need for close, 24 hr/day medical supervision in concert with the patient's rehab needs make it  unreasonable for this patient to be served in a less intensive setting? Yes 2. Co-Morbidities requiring supervision/potential complications: elevated bilirubin (consider further workup), anxiety (ensure anxiety and resulting apprehension do not limit functional progress; consider prn medications if warranted), grade 1 diastolic dysfunction (monitor for fluid overload), aspiration PNA (continue abx), morbid obesity (Body mass index is 41.26 kg/(m^2)., diet and exercise education, encourage weight loss to increase endurance and promote overall health), HTN (monitor and provide prns in accordance with increased physical exertion and pain), medical non-compliance (continue to educate and stress importance of compliance), tachypnea (monitor RR and O2 Sats with increased physical exertion), hypokalemia (continue to monitor and replete as necessary), ABLA (transfuse if necessary to ensure appropriate perfusion for increased activity tolerance), poststroke dysphagia (continue SLP, advance diet as tolerated) 3. Due to bladder management, safety, disease management, medication administration and  patient education, does the patient require 24 hr/day rehab nursing? Yes 4. Does the patient require coordinated care of a physician, rehab nurse, PT (1-2 hrs/day, 5 days/week), OT (1-2 hrs/day, 5 days/week) and SLP (1-2 hrs/day, 5 days/week) to address physical and functional deficits in the context of the above medical diagnosis(es)? Yes Addressing deficits in the following areas: balance, endurance, locomotion, strength, transferring, bowel/bladder control, bathing, dressing, feeding, grooming, toileting, cognition, speech, language, swallowing and psychosocial support 5. Can the patient actively participate in an intensive therapy program of at least 3 hrs of therapy per day at least 5 days per week? Not at present 6. The potential for patient to make measurable gains while on inpatient rehab is excellent 7. Anticipated  functional outcomes upon discharge from inpatient rehab are mod assist  with PT, mod assist with OT, supervision and min assist with SLP. 8. Estimated rehab length of stay to reach the above functional goals is: 20-24 days. 9. Does the patient have adequate social supports and living environment to accommodate these discharge functional goals? Potentially 10. Anticipated D/C setting: Home 11. Anticipated post D/C treatments: HH therapy and Home excercise program 12. Overall Rehab/Functional Prognosis: good  RECOMMENDATIONS: This patient's condition is appropriate for continued rehabilitative care in the following setting: Will need to clarify caregiver support at discharge. On most recent evaluation with therapies, patient require significant assistance. If this is not improved and patient does not have adequate support, may need SNF. However, will continue to follow as patient undergoes medical workup and reevaluate once workup has been completed. Patient has agreed to participate in recommended program. Potentially Note that insurance prior authorization may be required for reimbursement for recommended care.  Comment: Rehab Admissions Coordinator to follow up.  Delice Lesch, MD 07/23/2015

## 2015-07-19 NOTE — Progress Notes (Signed)
eLink Physician-Brief Progress Note Patient Name: Gwendolyn SimonSherry Morales DOB: 1965/09/02 MRN: [id number]   Date of Service  07/19/2015  HPI/Events of Note  K=2.7  eICU Interventions  Replace k     Intervention Category Major Interventions: Electrolyte abnormality - evaluation and management  Gwendolyn Morales 07/19/2015, 4:09 AM

## 2015-07-19 NOTE — Progress Notes (Signed)
Thank you for consult on Gwendolyn Morales. She remains intubated with question of plans for trach per RT. Will defer CIR consult for now and follow at a distance will medically ready for higher level of activity.

## 2015-07-19 NOTE — Progress Notes (Signed)
eLink Physician-Brief Progress Note Patient Name: Gwendolyn SimonSherry Morales DOB: Dec 12, 1965 MRN: [id number]   Date of Service  07/19/2015  HPI/Events of Note  Fever - Temp = 101.0 F in spite of Tylenol and ice packs. Desired temp 99 or less.   eICU Interventions  Will order: 1. Cooling blanket PRN.     Intervention Category Intermediate Interventions: Other:  Lenell AntuSommer,Aryella Besecker [place] 07/19/2015, 7:25 PM

## 2015-07-19 NOTE — Progress Notes (Signed)
PCCM Interval Note  I spoke with the patient's husband Molly MaduroRobert by phone, explained her current issues, status. I have recommended tracheostomy as a tool to continue her progress, facilitate vent liberation. He understands the rationale, risks, benefits. He agrees that we should proceed. We will get formal consent and plan to perform on 6/9.   Levy Pupaobert Vernica Wachtel, MD, PhD 07/19/2015, 10:30 AM [facility] [phone] or if no answer [phone]

## 2015-07-19 NOTE — Progress Notes (Signed)
STROKE TEAM PROGRESS NOTE   SUBJECTIVE (INTERVAL HISTORY) No family at bedside. She still has fever and CXR showed stable LLL pneumonia but seems improved. BP still on the high end, not able to wean off cleviprex so far. Renal A ultrasound showed right kidney no blood flow. Will have renal consult.   OBJECTIVE Temp:  [98.7 F (37.1 C)-102.7 F (39.3 C)] 101.1 F (38.4 C) (06/08 0400) Pulse Rate:  [52-122] 102 (06/08 0700) Cardiac Rhythm:  [-] Normal sinus rhythm (06/08 0600) Resp:  [22-44] 25 (06/08 0700) BP: (89-166)/(42-100) 97/42 mmHg (06/08 0700) SpO2:  [88 %-100 %] 98 % (06/08 0700) FiO2 (%):  [40 %-50 %] 40 % (06/08 0600) Weight:  [117.2 kg (258 lb 6.1 oz)] 117.2 kg (258 lb 6.1 oz) (06/08 0407)  CBC:   Recent Labs Lab 07/12/15 2317  07/17/15 0300 07/18/15 0618  WBC 11.3*  < > 14.7* 16.7*  NEUTROABS 9.2*  --   --   --   HGB 11.4*  < > 10.1* 10.2*  HCT 37.8  < > 34.2* 34.8*  MCV 72.1*  < > 73.4* 73.4*  PLT 359  < > 336 406*  < > = values in this interval not displayed.  Basic Metabolic Panel:   Recent Labs Lab 07/15/15 0550 07/16/15 0220  07/18/15 0618 07/19/15 0315  NA 144  --   < > 148* 147*  K 3.1*  --   < > 2.7* 2.7*  CL 109  --   < > 110 114*  CO2 25  --   < > 27 26  GLUCOSE 113*  --   < > 126* 117*  BUN 17  --   < > 30* 27*  CREATININE 0.81  --   < > 0.81 0.83  CALCIUM 8.9  --   < > 9.0 8.8*  MG 1.8 1.8  --   --   --   PHOS 3.1 2.8  --   --   --   < > = values in this interval not displayed.  Lipid Panel:     Component Value Date/Time   CHOL 187 07/13/2015 1057   TRIG 280* 07/18/2015 0618   HDL 60 07/13/2015 1057   CHOLHDL 3.1 07/13/2015 1057   VLDL 20 07/13/2015 1057   LDLCALC 107* 07/13/2015 1057   HgbA1c:  Lab Results  Component Value Date   HGBA1C 5.5 07/13/2015   Urine Drug Screen:     Component Value Date/Time   LABOPIA NONE DETECTED 07/16/2015 1414   COCAINSCRNUR NONE DETECTED 07/16/2015 1414   LABBENZ NONE DETECTED 07/16/2015  1414   AMPHETMU NONE DETECTED 07/16/2015 1414   THCU NONE DETECTED 07/16/2015 1414   LABBARB NONE DETECTED 07/16/2015 1414      IMAGING I have personally reviewed the radiological images below and agree with the radiology interpretations. Blue text is my interpretation  Ct Angio Head and neck W/cm &/or Wo Cm  07/13/2015  IMPRESSION: Stable acute small volume parenchymal hemorrhages as described in the LEFT pons and LEFT frontal operculum. No underlying extracranial or intracranial stenosis, vascular malformation or aneurysm. No evidence for venous sinus thrombosis. Extensive hypoattenuation of white matter, likely severe chronic microvascular ischemic change related to hypertension. 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  Ct Head Wo Contrast  07/14/2015  IMPRESSION: 1. Overall no significant interval change in left frontal opercular hemorrhage with mild localized edema. 2. Stable 6 mm left pontine hemorrhage without significant  edema. 3. No other new acute intracranial process.   07/12/2015  IMPRESSION: 1. Acute hemorrhage at the left parietal lobe, measuring 1.7 x 1.3 cm. 2. Small focus of acute hemorrhage at the left side of the pons, measuring 0.6 cm. 3. Scattered small vessel ischemic microangiopathy and mild chronic ischemic change at the basal ganglia bilaterally.  TTE - - Left ventricle: The cavity size was normal. Wall thickness was normal. Systolic function was normal. The estimated ejection fraction was in the range of 60% to 65%. Wall motion was normal; there were no regional wall motion abnormalities. Doppler parameters are consistent with abnormal left ventricular relaxation (grade 1 diastolic dysfunction). - Aortic valve: There was mild to moderate stenosis. Valve area (VTI): 1.85 cm^2. Valve area (Vmax): 1.42 cm^2. Valve area (Vmean): 1.39 cm^2. - Mitral valve: Severely calcified annulus. The findings are consistent  with mild stenosis. - Left atrium: The atrium was mildly dilated.  MRI brain Posterior left frontal opercular 2 cm hematoma. Left upper pons 9 mm hematoma. These hemorrhage appear acute/ subacute. Mild vasogenic edema surrounds left frontal lobe hematoma. Scattered small chronic blood breakdown products posterior limb right internal capsule, right external capsule, posterior right  opercular region, anterior right frontal lobe, left temporal lobe, right occipital lobe and cerebellum bilaterally.  Intracranial hemorrhage most likely related to hypertension given the present clinical setting and surrounding findings. Recommend follow-up until complete clearance. Small acute/subacute nonhemorrhagic infarcts frontal and parietal lobes bilaterally. Remote bilateral thalamic, bilateral centrum semiovale/ corona radiata, bilateral basal ganglia and right pontine infarcts. Marked confluent white matter changes most consistent with result of chronic microvascular disease. Linear dural enhancement of indeterminate etiology described in several benign settings. Opacification petrous apex and partial opacification left mastoid air cells without obstructing lesion of the eustachian tube noted. No definitive findings to suggest petrous a bursitis. If the patient developed cranial nerve symptoms then this possibly would need to be considered.  Autoimmune work up - elevated ESR, CRP and RF, others negative.  Renal A duplex - Right kidney is asymmetrically smaller than left and has hyperechoic appearance. Minimal arterial flow obtained in right renal artery and intrarenal flow, which is noted to be atypical. No apparent renal artery stenosis on the left.   PHYSICAL EXAM Obese middle aged 50 lady who is intubated and sedated on a ventilator. Febrile with low grade fever. Head is nontraumatic. Neck is supple. Cardiac exam no murmur or gallop.   Neurological Exam :  Patient is still intubated, not able to  wean off due to lack of copious secretion and fever. She is awake alert today and able to follows simple commands consistently. Pupils equal both reactive. Fundi were not visualized. B/l incomplete abduction. No nystagmus. LUE and LLE 4/5, RUE 3/5 and RLE 2+/5. Right plantar upgoing left downgoing. Sensation, coordination and gait was not tested.   ASSESSMENT/PLAN Gwendolyn Morales is a 50 y.o. female with history of HTN and noncompliance presenting with speech difficulty and R sided weakness following a fall. BP elevated. CT showed a L parietal and small pontine hemorrhage could be due to malignant hypertension, however, CNS vasculitis is in DDx.   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 However, location of ICH is really atypical. 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  MRI multifocal microbleeds and microinfarcts, concerning for HTN vs. Vasculitis  Consider LP or cerebral angio once pt more stabilized.   2D Echo  EF 60-65%   SCDs and lovenox for VTE prophylaxis  No antithrombotic prior to admission  Ongoing aggressive stroke risk factor management  Therapy recommendations:  CIR. Consult placed  Disposition:  pending   Hypertensive Emergency  BP 280/150 on arrival to hospital in setting of neuro symptoms  On cardene, but d/c due to large volume  Put on cleviprex, wean off as able  Put on amlodipine, HCTZ and lisinopril   Add metoprolol and hydralazine  SBP goal < 160  Renal artery duplex - small R kidney w/ right renal artery lack of flow   Renal consulted  Malignant HTN work up - pending  Pneumonia / respiratory failure  Intubated and on vent  CCM on  board  Febrile   Blood culture pending  On zosyn and vanco  Leukocytosis 11.3->14.7->16.7  Copious scecretions  Not able to extubate at this time - considering trach by CCM.   Other Stroke Risk Factors  Morbid Obesity, Body mass index is 41.72 kg/(m^2).   Other Active Problems   Hypokalemia - supplement  Hypernatremia - free water  Hospital day # 7  This patient is critically ill due to multifocal ICH, respiratory failure, hypertensive emergency, HACP and at significant risk of neurological worsening, death form hematoma enlargement, seizure, heart failure. This patient's care requires constant monitoring of vital signs, hemodynamics, respiratory and cardiac monitoring, review of multiple databases, neurological assessment, discussion with family, other specialists and medical decision making of high complexity. I spent 40 minutes of neurocritical care time in the care of this patient.  Rosalin Hawking, MD PhD Stroke Neurology 07/19/2015 8:57 AM   To contact Stroke Continuity provider, please refer to [email or web address] After hours, contact General Neurology

## 2015-07-20 ENCOUNTER — Inpatient Hospital Stay (HOSPITAL_COMMUNITY): Payer: MEDICAID

## 2015-07-20 ENCOUNTER — Inpatient Hospital Stay (HOSPITAL_COMMUNITY): Payer: Self-pay

## 2015-07-20 DIAGNOSIS — J9601 Acute respiratory failure with hypoxia: Secondary | ICD-10-CM | POA: Insufficient documentation

## 2015-07-20 DIAGNOSIS — J189 Pneumonia, unspecified organism: Secondary | ICD-10-CM

## 2015-07-20 DIAGNOSIS — J9600 Acute respiratory failure, unspecified whether with hypoxia or hypercapnia: Secondary | ICD-10-CM | POA: Insufficient documentation

## 2015-07-20 LAB — CATECHOLAMINES, FRACTIONATED, PLASMA
Dopamine: <30 pg/mL (ref 0–48)
Epinephrine: 29 pg/mL (ref 0–62)
Norepinephrine: 512 pg/mL (ref 0–874)

## 2015-07-20 LAB — GLUCOSE, CAPILLARY
GLUCOSE-CAPILLARY: 112 mg/dL — AB (ref 65–99)
GLUCOSE-CAPILLARY: 141 mg/dL — AB (ref 65–99)
GLUCOSE-CAPILLARY: 116 mg/dL — AB (ref 65–99)
Glucose-Capillary: 106 mg/dL — ABNORMAL HIGH (ref 65–99)
Glucose-Capillary: 105 mg/dL — ABNORMAL HIGH (ref 65–99)
Glucose-Capillary: 103 mg/dL — ABNORMAL HIGH (ref 65–99)

## 2015-07-20 LAB — CBC
HCT: 37.2 % (ref 36.0–46.0)
HEMOGLOBIN: 11 g/dL — AB (ref 12.0–15.0)
MCH: 21.7 pg — AB (ref 26.0–34.0)
MCHC: 29.6 g/dL — AB (ref 30.0–36.0)
MCV: 73.4 fL — AB (ref 78.0–100.0)
Platelets: 360 10*3/uL (ref 150–400)
RBC: 5.07 MIL/uL (ref 3.87–5.11)
RDW: 19.9 % — ABNORMAL HIGH (ref 11.5–15.5)
WBC: 12.7 10*3/uL — ABNORMAL HIGH (ref 4.0–10.5)

## 2015-07-20 LAB — URINALYSIS, ROUTINE W REFLEX MICROSCOPIC
GLUCOSE, UA: NEGATIVE mg/dL
HGB URINE DIPSTICK: NEGATIVE
Ketones, ur: NEGATIVE mg/dL
Nitrite: NEGATIVE
Protein, ur: NEGATIVE mg/dL
SPECIFIC GRAVITY, URINE: 1.024 (ref 1.005–1.030)
pH: 5.5 (ref 5.0–8.0)

## 2015-07-20 LAB — BASIC METABOLIC PANEL
Anion gap: 12 (ref 5–15)
BUN: 34 mg/dL — ABNORMAL HIGH (ref 6–20)
CHLORIDE: 107 mmol/L (ref 101–111)
CO2: 26 mmol/L (ref 22–32)
CREATININE: 1.07 mg/dL — AB (ref 0.44–1.00)
Calcium: 9.1 mg/dL (ref 8.9–10.3)
GFR calc Af Amer: >60 mL/min (ref >60)
GFR calc non Af Amer: 60 mL/min — ABNORMAL LOW (ref >60)
GLUCOSE: 102 mg/dL — AB (ref 65–99)
Potassium: 3.4 mmol/L — ABNORMAL LOW (ref 3.5–5.1)
Sodium: 145 mmol/L (ref 135–145)

## 2015-07-20 LAB — URINE MICROSCOPIC-ADD ON: BACTERIA UA: NONE SEEN

## 2015-07-20 MED ORDER — MIDAZOLAM HCL 2 MG/2ML IJ SOLN
4.0000 mg | Freq: Once | INTRAMUSCULAR | Status: AC
  Administered 2015-07-20: 2 mg via INTRAVENOUS
  Filled 2015-07-20: qty 4

## 2015-07-20 MED ORDER — VITAL HIGH PROTEIN PO LIQD
1000.0000 mL | ORAL | Status: DC
  Administered 2015-07-21: 1000 mL

## 2015-07-20 MED ORDER — VECURONIUM BROMIDE 10 MG IV SOLR
10.0000 mg | Freq: Once | INTRAVENOUS | Status: AC
  Administered 2015-07-20: 10 mg via INTRAVENOUS
  Filled 2015-07-20: qty 10

## 2015-07-20 MED ORDER — METOPROLOL TARTRATE 25 MG/10 ML ORAL SUSPENSION
100.0000 mg | Freq: Two times a day (BID) | ORAL | Status: DC
  Administered 2015-07-20 – 2015-07-24 (×9): 100 mg
  Filled 2015-07-20 (×10): qty 40

## 2015-07-20 MED ORDER — PRO-STAT SUGAR FREE PO LIQD
60.0000 mL | Freq: Four times a day (QID) | ORAL | Status: DC
  Administered 2015-07-20 – 2015-07-21 (×4): 60 mL
  Filled 2015-07-20 (×4): qty 60

## 2015-07-20 MED ORDER — POTASSIUM CHLORIDE 20 MEQ/15ML (10%) PO SOLN
40.0000 meq | ORAL | Status: AC
  Administered 2015-07-20 (×2): 40 meq
  Filled 2015-07-20 (×2): qty 30

## 2015-07-20 MED ORDER — FUROSEMIDE 10 MG/ML IJ SOLN
60.0000 mg | Freq: Once | INTRAMUSCULAR | Status: AC
  Administered 2015-07-20: 60 mg via INTRAVENOUS
  Filled 2015-07-20: qty 6

## 2015-07-20 MED ORDER — FENTANYL CITRATE (PF) 100 MCG/2ML IJ SOLN
200.0000 ug | Freq: Once | INTRAMUSCULAR | Status: AC
  Administered 2015-07-20: 100 ug via INTRAVENOUS
  Filled 2015-07-20: qty 4

## 2015-07-20 MED ORDER — ETOMIDATE 2 MG/ML IV SOLN
40.0000 mg | Freq: Once | INTRAVENOUS | Status: AC
  Administered 2015-07-20: 20 mg via INTRAVENOUS
  Filled 2015-07-20: qty 20

## 2015-07-20 NOTE — Progress Notes (Signed)
Pt s/p tracheostomy today.  Remains on the ventilator, but will start weaning trials tomorrow, per notes.  Will continue to follow progression.   Hopeful for inpatient rehab when medically stable.    Quintella BatonJulie W. Callaway Hardigree, RN, BSN  Trauma/Neuro ICU Case Manager [phone]

## 2015-07-20 NOTE — Progress Notes (Signed)
Patient underwent trach and remains vented. Nursing reports plans for ATC trials tomorrow. Will continue to follow at a distance for now.

## 2015-07-20 NOTE — Progress Notes (Signed)
STROKE TEAM PROGRESS NOTE   SUBJECTIVE (INTERVAL HISTORY) No family at bedside, no events overnight. She was admitted with intracerebral hemorrhage due to HTN, BP 280/150.  She was started on nicardipine for BP control, admitted requiring intubation. She remains hypertensive but BP much improved since admission.   OBJECTIVE Temp:  [98.8 F (37.1 C)-102.8 F (39.3 C)] 98.8 F (37.1 C) (06/09 0715) Pulse Rate:  [36-108] 85 (06/09 0902) Cardiac Rhythm:  [-] Normal sinus rhythm (06/09 0800) Resp:  [16-42] 21 (06/09 0902) BP: (116-189)/(57-137) 166/84 mmHg (06/09 0822) SpO2:  [92 %-100 %] 100 % (06/09 0902) FiO2 (%):  [40 %] 40 % (06/09 0902) Weight:  [116.7 kg (257 lb 4.4 oz)] 116.7 kg (257 lb 4.4 oz) (06/09 0500)  CBC:   Recent Labs Lab 07/18/15 0618 07/20/15 0306  WBC 16.7* 12.7*  HGB 10.2* 11.0*  HCT 34.8* 37.2  MCV 73.4* 73.4*  PLT 406* 323    Basic Metabolic Panel:   Recent Labs Lab 07/15/15 0550 07/16/15 0220  07/19/15 1802 07/20/15 0306  NA 144  --   < > 145 145  K 3.1*  --   < > 3.2* 3.4*  CL 109  --   < > 109 107  CO2 25  --   < > 26 26  GLUCOSE 113*  --   < > 107* 102*  BUN 17  --   < > 23* 34*  CREATININE 0.81  --   < > 0.91 1.07*  CALCIUM 8.9  --   < > 8.8* 9.1  MG 1.8 1.8  --   --   --   PHOS 3.1 2.8  --   --   --   < > = values in this interval not displayed.  Lipid Panel:     Component Value Date/Time   CHOL 187 07/13/2015 1057   TRIG 149 07/19/2015 1025   HDL 60 07/13/2015 1057   CHOLHDL 3.1 07/13/2015 1057   VLDL 20 07/13/2015 1057   LDLCALC 107* 07/13/2015 1057   HgbA1c:  Lab Results  Component Value Date   HGBA1C 5.5 07/13/2015   Urine Drug Screen:     Component Value Date/Time   LABOPIA NONE DETECTED 07/16/2015 1414   COCAINSCRNUR NONE DETECTED 07/16/2015 1414   LABBENZ NONE DETECTED 07/16/2015 1414   AMPHETMU NONE DETECTED 07/16/2015 1414   THCU NONE DETECTED 07/16/2015 1414   LABBARB NONE DETECTED 07/16/2015 1414       IMAGING Blue text is Dr. Phoebe Sharps interpretation  Ct Angio Head and neck W/cm &/or Wo Cm 07/13/2015  IMPRESSION: Stable acute small volume parenchymal hemorrhages as described in the LEFT pons and LEFT frontal operculum. No underlying extracranial or intracranial stenosis, vascular malformation or aneurysm. No evidence for venous sinus thrombosis. Extensive hypoattenuation of white matter, likely severe chronic microvascular ischemic change related to hypertension. 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  Ct Head Wo Contrast 07/14/2015  IMPRESSION: 1. Overall no significant interval change in left frontal opercular hemorrhage with mild localized edema. 2. Stable 6 mm left pontine hemorrhage without significant edema. 3. No other new acute intracranial process.  07/12/2015  IMPRESSION: 1. Acute hemorrhage at the left parietal lobe, measuring 1.7 x 1.3 cm. 2. Small focus of acute hemorrhage at the left side of the pons, measuring 0.6 cm. 3. Scattered small vessel ischemic microangiopathy and mild chronic ischemic change at the basal ganglia bilaterally.  TTE - Left ventricle: The cavity  size was normal. Wall thickness was normal. Systolic function was normal. The estimated ejection fraction was in the range of 60% to 65%. Wall motion was normal; there were no regional wall motion abnormalities. Doppler parameters are consistent with abnormal left ventricular relaxation (grade 1 diastolic dysfunction). - Aortic valve: There was mild to moderate stenosis. Valve area (VTI): 1.85 cm^2. Valve area (Vmax): 1.42 cm^2. Valve area (Vmean): 1.39 cm^2. - Mitral valve: Severely calcified annulus. The findings are consistent with mild stenosis. - Left atrium: The atrium was mildly dilated.  MRI brain Posterior left frontal opercular 2 cm hematoma. Left upper pons 9 mm hematoma. These hemorrhage appear acute/ subacute. Mild vasogenic edema surrounds  left frontal lobe hematoma. Scattered small chronic blood breakdown products posterior limb right internal capsule, right external capsule, posterior right  opercular region, anterior right frontal lobe, left temporal lobe, right occipital lobe and cerebellum bilaterally. Intracranial hemorrhage most likely related to hypertension given the present clinical setting and surrounding findings. Recommend follow-up until complete clearance. Small acute/subacute nonhemorrhagic infarcts frontal and parietal lobes bilaterally. Remote bilateral thalamic, bilateral centrum semiovale/ corona radiata, bilateral basal ganglia and right pontine infarcts. Marked confluent white matter changes most consistent with result of chronic microvascular disease. Linear dural enhancement of indeterminate etiology described in several benign settings. Opacification petrous apex and partial opacification left mastoid air cells without obstructing lesion of the eustachian tube noted. No definitive findings to suggest petrous a bursitis. If the patient developed cranial nerve symptoms then this possibly would need to be considered.  Autoimmune work up - elevated ESR, CRP and RF, others negative.  Renal A duplex - Right kidney is asymmetrically smaller than left and has hyperechoic appearance. Minimal arterial flow obtained in right renal artery and intrarenal flow, which is noted to be atypical. No apparent renal artery stenosis on the left.   PHYSICAL EXAM; she is sedated due to peg placement but exam appears stable Obese middle aged 50 lady who is intubated and sedated on a ventilator. Febrile with low grade fever. Head is nontraumatic. Neck is supple. Cardiac exam no murmur or gallop.  Neurological Exam :  Patient is still intubated, not able to wean off due to lack of copious secretions which have improved, today she is sedated due to trach placement planned soone. She is not alert today, very drowsy but still able to  follows simple commands consistently. Pupils equal both reactive. Fundi were not visualized. B/l incomplete abduction. No nystagmus. LUE and LLE 4/5, RUE 3/5 and RLE 2+/5. Right plantar upgoing left downgoing. Sensation, coordination and gait was not tested.   ASSESSMENT/PLAN [name] is a 50 y.o. female with history of HTN and noncompliance presenting with speech difficulty and R sided weakness following a fall. BP elevated. CT showed a L parietal and small pontine hemorrhage could be due to malignant hypertension, however, CNS vasculitis is in DDx.   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 However, location of ICH is really atypical. 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  MRI multifocal microbleeds and microinfarcts, concerning for HTN vs. Vasculitis  Consider LP or cerebral angio once pt more stabilized.   2D Echo  EF 60-65%   SCDs and lovenox for VTE prophylaxis  No antithrombotic prior to  admission  Ongoing aggressive stroke risk factor management  Therapy recommendations:  CIR. They are following at a distance until ready for higher level of activity  Disposition:  pending   Hypertensive Emergency  BP 280/150 on arrival to hospital in setting of neuro symptoms  On cardene, but d/c due to large volume  Put on cleviprex, at max dose today  Put on amlodipine 10 qd, HCTZ 25 qd, lisinopril 20 bid, metoprolol 75 bid and hydralazine 50 q8  Increase metoprolol to 100 bid  Increase SBP goal to < 180  Renal artery duplex - small R kidney w/ right renal artery lack of flow   Renal consulted and feels HTN not related to renal disease but directly associated with  ICH and long-standing HTN  Malignant HTN work up - pending  Pneumonia / respiratory failure  Intubated and on vent  CCM on board  Febrile   Blood culture no growth thus far  On zosyn and vanco  Leukocytosis 11.3->14.7->16.7-> 12.7  Copious scecretions  Not able to extubate at this time  CCM placing trach this am   Other Stroke Risk Factors  Morbid Obesity, Body mass index is 41.55 kg/(m^2).   Other Active Problems   Hypokalemia - supplement  Hypernatremia - free water  Hospital day # 8   Personally examined patient and images, and have participated in and made any corrections needed to history, physical, neuro exam,assessment and plan as stated above.  I have personally obtained the history, evaluated lab date, reviewed imaging studies and agree with radiology interpretations.    Sarina Ill, MD Stroke Neurology [phone] [facility]    To contact Stroke Continuity provider, please refer to [email or web address] After hours, contact General Neurology

## 2015-07-20 NOTE — Procedures (Signed)
Bronchoscopy Procedure Note Jerre SimonSherry Check [id number] [birth year]  Procedure: Bronchoscopy Indications: To facilitate trach placement  Procedure Details Consent: Risks of procedure as well as the alternatives and risks of each were explained to the (patient/caregiver).  Consent for procedure obtained. Time Out: Verified patient identification, verified procedure, site/side was marked, verified correct patient position, special equipment/implants available, medications/allergies/relevent history reviewed, required imaging and test results available.  Performed  In preparation for procedure, patient was given 100% FiO2. Sedation: Benzodiazepines, Muscle relaxants and Etomidate   FOB performed via ETT to allow perc trach placement. ETT withdrawn 5 cm and cannulation of trachea with needle and guidewire observed in real-time. There was no evidence of needle penetration of the posterior tracheal wall or wire injury seen. Please refer also to the trach procedure Note by Dr Molli KnockYacoub. After trach was placed there was no bleeding in the proximal trachea. FOB was withdrawn and reintroduced via the new trach. There was no distal bleeding seen. All airways inspected, no lesions, no significant secretions Trach entered and the following bronchi were examined: RUL, RML, RLL, LUL and LLL.     Evaluation Hemodynamic Status: BP stable throughout; O2 sats: stable throughout Patient's Current Condition: stable Specimens:  None Complications: No apparent complications Patient did tolerate procedure well.  Levy Pupaobert Jamilla Galli, MD, PhD 07/20/2015, 10:01 AM [facility] [phone] or if no answer [phone]

## 2015-07-20 NOTE — Procedures (Signed)
Bedside Tracheostomy Insertion Procedure Note   Patient Details:   Name: Gwendolyn SimonSherry Morales DOB: [birth year] MRN: [id number]  Procedure: Tracheostomy  Pre Procedure Assessment: ET Tube Size:7.0 ET Tube secured at lip (cm):22 Bite block in place: no Breath Sounds: bilateral  Post Procedure Assessment: BP 146/72 mmHg  Pulse 98  Temp(Src) 98.8 F (37.1 C) (Axillary)  Resp 27  Ht 5\' 6"  (1.676 m)  Wt 257 lb 4.4 oz (116.7 kg)  BMI 41.55 kg/m2  SpO2 99% O2 sats:100 Complications:no complications Patient did tolerate procedure well Tracheostomy Brand:shiley Tracheostomy Style:cuffed Tracheostomy Size: 6.0 Tracheostomy Secured BJY:NWGNFAOvia:sutures and velcro ties Tracheostomy Placement Confirmation:direct visualization    Shanda BumpsBrewer, Babita Faye 07/20/2015, 4:42 PM

## 2015-07-20 NOTE — Procedures (Signed)
Percutaneous Tracheostomy Placement  Consent from family.  Patient sedated, paralyzed and position.  Placed on 100% FiO2 and RR matched.  Area cleaned and draped.  Lidocaine/epi injected.  Skin incision done followed by blunt dissection.  Trachea palpated then punctured, catheter passed and visualized bronchoscopically.  Wire placed and visualized.  Catheter removed.  Airway then entered and dilated.  Size 6 cuffed shiley trach placed and visualized bronchoscopically well above carina.  Good volume returns.  Patient tolerated the procedure well without complications.  Minimal blood loss.  CXR ordered and pending.  Schwanda Zima G. Mahum Betten, M.D. [facility]. Pager: [phone]. After hours pager: [phone].  

## 2015-07-20 NOTE — Progress Notes (Signed)
S:intubated sedated O:BP 166/84 mmHg  Pulse 89  Temp(Src) 98.8 F (37.1 C) (Axillary)  Resp 26  Ht 5' 6"  (1.676 m)  Wt 116.7 kg (257 lb 4.4 oz)  BMI 41.55 kg/m2  SpO2 99%  Intake/Output Summary (Last 24 hours) at 07/20/15 0857 Last data filed at 07/20/15 0800  Gross per 24 hour  Intake 3104.08 ml  Output   3033 ml  Net  71.08 ml   Weight change: -0.5 kg (-1 lb 1.6 oz) JEH:UDJSHFWYO, sedated CVS:RRR Resp:clear ant Abd:+ BS ND No HSM Ext:tr edema NEURO:sedated   . amLODipine  10 mg Per Tube Daily  . antiseptic oral rinse  7 mL Mouth Rinse 10 times per day  . chlorhexidine gluconate (SAGE KIT)  15 mL Mouth Rinse BID  . enoxaparin (LOVENOX) injection  40 mg Subcutaneous Q24H  . feeding supplement (PRO-STAT SUGAR FREE 64)  60 mL Per Tube TID  . feeding supplement (VITAL HIGH PROTEIN)  1,000 mL Per Tube Q24H  . free water  400 mL Per Tube Q4H  . hydrALAZINE  50 mg Oral Q8H  . hydrochlorothiazide  25 mg Per Tube Daily  . lisinopril  20 mg Oral BID  . metoprolol tartrate  75 mg Per Tube BID  . pantoprazole sodium  40 mg Per Tube Q24H  . piperacillin-tazobactam (ZOSYN)  IV  3.375 g Intravenous Q8H  . vancomycin  1,000 mg Intravenous Q12H   Dg Chest Port 1 View  07/19/2015  CLINICAL DATA:  Acute respiratory failure EXAM: PORTABLE CHEST 1 VIEW COMPARISON:  07/18/2015 FINDINGS: Tubular devices are stable. Left lower lobe consolidation is stable. Right lung is under aerated and clear. No pneumothorax. IMPRESSION: Stable left lower lobe pneumonia. Electronically Signed   By: Marybelle Killings M.D.   On: 07/19/2015 08:12   Dg Chest Port 1 View  07/18/2015  CLINICAL DATA:  F/u to resp failure - pt has an et tube EXAM: PORTABLE CHEST - 1 VIEW COMPARISON:  07/17/2015 FINDINGS: Progressive airspace consolidation in the left mid and lower lung, obscuring the left diaphragmatic leaflet. Right lung remains clear. Heart size upper limits some normal for technique. Can't exclude  adjacent left pleural  effusion.  No pneumothorax. Endotracheal tube and nasogastric tube stable in position. Visualized bones unremarkable. IMPRESSION: 1. Progressive left mid and lower lung airspace  consolidation. 2.  Support hardware stable in position. Electronically Signed   By: Lucrezia Europe M.D.   On: 07/18/2015 11:16   BMET    Component Value Date/Time   NA 145 07/20/2015 0306   K 3.4* 07/20/2015 0306   CL 107 07/20/2015 0306   CO2 26 07/20/2015 0306   GLUCOSE 102* 07/20/2015 0306   BUN 34* 07/20/2015 0306   CREATININE 1.07* 07/20/2015 0306   CALCIUM 9.1 07/20/2015 0306   GFRNONAA 60* 07/20/2015 0306   GFRAA >60 07/20/2015 0306   CBC    Component Value Date/Time   WBC 12.7* 07/20/2015 0306   RBC 5.07 07/20/2015 0306   HGB 11.0* 07/20/2015 0306   HCT 37.2 07/20/2015 0306   PLT 360 07/20/2015 0306   MCV 73.4* 07/20/2015 0306   MCH 21.7* 07/20/2015 0306   MCHC 29.6* 07/20/2015 0306   RDW 19.9* 07/20/2015 0306   LYMPHSABS 1.2 07/12/2015 2317   MONOABS 0.6 07/12/2015 2317   EOSABS 0.3 07/12/2015 2317   BASOSABS 0.0 07/12/2015 2317     Assessment: 1. HTN under reasonable control.  Interesting echo does not show LVH so a lot of her  HTN currently may be driven by intracerebral bleed.  Atrophic Rt kidney 2.  Intracerebral bleed   Plan: 1. Spoke with Dr Lamonte Sakai and he feels he can manage HTN.  If further renal issues arise please let us know 2. Check UA   Najeh Credit T

## 2015-07-20 NOTE — Progress Notes (Signed)
Nutrition Follow-up  DOCUMENTATION CODES:   Morbid obesity  INTERVENTION:  Decrease Vital High Protein to 5 ml/hr 60 ml Prostat QID Provides: 920 kcal, 130 grams protein, and 100 ml H2O.   Monitor for changes in Cleviprex and Propofol rates  NUTRITION DIAGNOSIS:   Inadequate oral intake related to inability to eat as evidenced by NPO status. Ongoing.   GOAL:   Provide needs based on ASPEN/SCCM guidelines Met.   MONITOR:   Vent status, Labs, Weight trends, I & O's, TF tolerance  REASON FOR ASSESSMENT:   Consult Enteral/tube feeding initiation and management  ASSESSMENT:   50 y/o female PHMx HTN presented after falling at home and afterwards haiving difficulty with speech+ R side weajnes. CT showed intraparenchymal hemorrhages. Intubated 6/2 due to poor airway protection. Nutrition consulted for TF.   6/9 trach and Cortrak placed (tip distal stomach)  Temp (24hrs), Avg:100.8 F (38.2 C), Min:98.8 F (37.1 C), Max:102.8 F (39.3 C)  Propofol: 17.6 ml/hr = 464 kcal  Cleviprex: 36 ml/hr = 1728 kcal Totals = 2194  Kcal per day from lipid  Medications reviewed.  Free water: 400 ml every 4 hours = 2400 ml Labs reviewed: K+ 3.4, TG 149 CBG's: 106-141  Diet Order:  Diet NPO time specified  Skin:  Reviewed, no issues  Last BM:  6/8  Height:   Ht Readings from Last 1 Encounters:  07/16/15 _0  (1.676 m)    Weight:   Wt Readings from Last 1 Encounters:  07/20/15 257 lb 4.4 oz (116.7 kg)    Ideal Body Weight:  59.1 kg  BMI:  Body mass index is 41.55 kg/(m^2).  Estimated Nutritional Needs:   Kcal:  1254-1596 (11-14 kcal/kg ABW)  Protein:  >118-140 g (2g/kw ibw  Fluid:  Per MD  EDUCATION NEEDS:   No education needs identified at this time  [place], [place], [place] Pager [phone] After Hours Pager

## 2015-07-20 NOTE — Progress Notes (Signed)
eLink Physician-Brief Progress Note Patient Name: Gwendolyn SimonSherry Morales DOB: 07/20/1965 MRN: [id number]   Date of Service  07/20/2015  HPI/Events of Note  Hypokalemia  eICU Interventions  Potassium replaced     Intervention Category Intermediate Interventions: Electrolyte abnormality - evaluation and management  DETERDING,ELIZABETH 07/20/2015, 12:31 AM

## 2015-07-20 NOTE — Progress Notes (Signed)
PULMONARY / CRITICAL CARE MEDICINE   Name: Gwendolyn Morales MRN: [id number] DOB: April 17, 1965    ADMISSION DATE:  07/12/2015 CONSULTATION DATE:  07/13/2015  REFERRING MD:  Dr. [name]  CHIEF COMPLAINT:  Fall  HISTORY OF PRESENT ILLNESS:   50 yo female presented to ER after falling.  She had difficulty with speech and Rt sided weakness.  She had BP 280/150.  CT head showed 1.7 x 1.3 cm Lt parietal hemorrhage and 0.6 cm lateral pons hemorrhage.  NIH stroke score 10.  She was started on nicardipine for BP control, and admitted by stroke service.  She was noted to having snoring respiratory pattern, poor gag reflex, and intermittent hypoxia. Required intubation.  SUBJECTIVE:  Fewer secretions today Remains hypertensive and on cleviprex  VITAL SIGNS: BP 166/84 mmHg  Pulse 85  Temp(Src) 98.8 F (37.1 C) (Axillary)  Resp 21  Ht  (1.676 m)  Wt 116.7 kg (257 lb 4.4 oz)  BMI 41.55 kg/m2  SpO2 100%  HEMODYNAMICS:    VENTILATOR SETTINGS: Vent Mode:  [-] PRVC FiO2 (%):  [40 %] 40 % Set Rate:  [14 bmp-20 bmp] 20 bmp Vt Set:  [470 mL-4700 mL] 470 mL PEEP:  [5 cmH20] 5 cmH20 Pressure Support:  [5 cmH20-15 cmH20] 5 cmH20 Plateau Pressure:  [11 cmH20-31 cmH20] 11 cmH20  INTAKE / OUTPUT: I/O last 3 completed shifts: In: 4662.5 [I.V.:2137.5; NG/GT:1425; IV Piggyback:1100] Out: 4098 [Urine:4905; Stool:3]  PHYSICAL EXAMINATION: General:  Awake, comfortable Neuro:  Follows commands, tracks and follows HEENT: No JVD, ETT-> vent Cardiovascular:  s1 s2 RRT no r Lungs:  Ronchi, decreased in bases Abdomen:  Soft, obese, NT,  Musculoskeletal:  No sig edema Skin:  No rash  LABS:  BMET  Recent Labs Lab 07/19/15 0315 07/19/15 1802 07/20/15 0306  NA 147* 145 145  K 2.7* 3.2* 3.4*  CL 114* 109 107  CO2 BUN 27* 23* 34*  CREATININE 0.83 0.91 1.07*  GLUCOSE 117* 107* 102*    Electrolytes  Recent Labs Lab 07/14/15 1637 07/15/15 0550 07/16/15 0220  07/19/15 0315  07/19/15 1802 07/20/15 0306  CALCIUM  --  8.9  --   < > 8.8* 8.8* 9.1  MG 1.9 1.8 1.8  --   --   --   --   PHOS 2.6 3.1 2.8  --   --   --   --   < > = values in this interval not displayed.  CBC  Recent Labs Lab 07/17/15 0300 07/18/15 0618 07/20/15 0306  WBC 14.7* 16.7* 12.7*  HGB 10.1* 10.2* 11.0*  HCT 34.2* 34.8* 37.2  PLT 336 406* 360    Coag's No results for input(s): APTT, INR in the last 168 hours.  Sepsis Markers No results for input(s): LATICACIDVEN, PROCALCITON, O2SATVEN in the last 168 hours.  ABG  Recent Labs Lab 07/13/15 1901  PHART 7.349*  PCO2ART 45.1*  PO2ART 63.0*    Liver Enzymes No results for input(s): AST, ALT, ALKPHOS, BILITOT, ALBUMIN in the last 168 hours.  Cardiac Enzymes No results for input(s): TROPONINI, PROBNP in the last 168 hours.  Glucose  Recent Labs Lab 07/19/15 0731 07/19/15 1154 07/19/15 1524 07/19/15 1918 07/19/15 2325 07/20/15 0345  GLUCAP 108* 165* 104* 130* 136* 106*    Imaging No results found.   STUDIES:  6/01 CT head >> 1.7 x 1.3 cm Lt parietal ICH, 0.6 cm Lt pons ICH, scattered small vessel ischemia 6/03 Echo >> LVEF 65%, grade 1 diastolic  dysfxn MRI Brain 6/6 >> L frontal occipital hematoma 2cm, L upper pons 9mm, mild vasogenic edema.   CULTURES: Blood 6/5 >>  Blood 6/7 >>  resp 6/8 >> moderate GPC >>   ANTIBIOTICS: Pip/tazo 6/7 >>  vanco 6/7 >>   SIGNIFICANT EVENTS: 6/01 Admit 6/2- HTN, poor airway control, intubated 6/4 very awake  LINES/TUBES: 6/2 ett>>  DISCUSSION: 50 yo female with Lt parietal and Lt pontine hemorrhage in setting of HTN urgency.  Now with snoring respirations, poor gag reflex, and intermittent hypoxia. 6/2 intubated for airway protection. Concern for UA swelling and irritation. MS improved. LLL HCAP. Plan for trach on 6/9  ASSESSMENT / PLAN:  NEUROLOGIC A:   Acute Lt parietal and pontine hemorrhage P:   RASS goal: 0 to -1 Airway secured 6/2 with  ETT neurochecks  Propofol for sedation Imaging per neuro > CT, MRI brain as above MS improved BP control  PULMONARY A: Acute hypoxic respiratory failure Upper airway, poor control secretions LLL PNA P:   Vent bundle F/u CXR Her secretions are improved but remain concerned that she is high risk for UA obstruction, especially on ACE-I   CARDIOVASCULAR A:  HTN urgency. P:  Clevidipine to keep SBP < 160, still requiring Amlodipine 10, hydralazine scheduled, lisinopril, metop 75 bid,  Echo ef 65% g1df Hopefully trach will make her more comfortable, will allow pressures to decrease Consider addition clonidine 6/9 if remains hypertensive,   RENAL  Recent Labs Lab 07/19/15 0315 07/19/15 1802 07/20/15 0306  K 2.7* 3.2* 3.4*   A:   Hypokalemia. P:   Replace electrolytes as needed Continue scheduled diuresis, goal net negative Follow BMP   GASTROINTESTINAL A:   Dysphagia prior to intubation  P:   NPO  OGT and feeds  HEMATOLOGIC A:   DVT prevention P:  SCDs for DVT prophylaxis Follow CBC  INFECTIOUS A:   New LLL infiltrate, possible VAP / HCAP P:   Started vanco + pip/tazo 6/7, follow CXR and cx data  ENDOCRINE CBG (last 3)   Recent Labs  07/19/15 1918 07/19/15 2325 07/20/15 0345  GLUCAP 130* 136* 106*   A:   Mild hyperglycemia.  P:     Monitor blood sugar on BMET   FAMILY  - Updates: updated her father at bedside 6/5, her husband by phone 6/8, her husband at bedside 6/9  - Inter-disciplinary family meet or Palliative Care meeting due by: 6/9  Independent Cc time 35 minutes   Levy Pupaobert Byrum, MD, PhD 07/20/2015, 9:04 AM [facility] [phone] or if no answer [phone]

## 2015-07-21 ENCOUNTER — Inpatient Hospital Stay (HOSPITAL_COMMUNITY): Payer: Self-pay

## 2015-07-21 DIAGNOSIS — E876 Hypokalemia: Secondary | ICD-10-CM | POA: Insufficient documentation

## 2015-07-21 DIAGNOSIS — I611 Nontraumatic intracerebral hemorrhage in hemisphere, cortical: Secondary | ICD-10-CM | POA: Insufficient documentation

## 2015-07-21 DIAGNOSIS — R7989 Other specified abnormal findings of blood chemistry: Secondary | ICD-10-CM | POA: Insufficient documentation

## 2015-07-21 LAB — HEPATIC FUNCTION PANEL
ALT: 194 U/L — AB (ref 14–54)
AST: 178 U/L — AB (ref 15–41)
Albumin: 2.6 g/dL — ABNORMAL LOW (ref 3.5–5.0)
Alkaline Phosphatase: 240 U/L — ABNORMAL HIGH (ref 38–126)
BILIRUBIN DIRECT: 3.1 mg/dL — AB (ref 0.1–0.5)
BILIRUBIN INDIRECT: 1.5 mg/dL — AB (ref 0.3–0.9)
BILIRUBIN TOTAL: 4.6 mg/dL — AB (ref 0.3–1.2)
Total Protein: 6.9 g/dL (ref 6.5–8.1)

## 2015-07-21 LAB — BASIC METABOLIC PANEL
Anion gap: 13 (ref 5–15)
BUN: 44 mg/dL — ABNORMAL HIGH (ref 6–20)
CALCIUM: 8.7 mg/dL — AB (ref 8.9–10.3)
CO2: 22 mmol/L (ref 22–32)
Chloride: 107 mmol/L (ref 101–111)
Creatinine, Ser: 1.17 mg/dL — ABNORMAL HIGH (ref 0.44–1.00)
GFR, EST NON AFRICAN AMERICAN: 54 mL/min — AB (ref >60)
GLUCOSE: 104 mg/dL — AB (ref 65–99)
Potassium: 3.3 mmol/L — ABNORMAL LOW (ref 3.5–5.1)
SODIUM: 142 mmol/L (ref 135–145)

## 2015-07-21 LAB — GLUCOSE, CAPILLARY
GLUCOSE-CAPILLARY: 107 mg/dL — AB (ref 65–99)
GLUCOSE-CAPILLARY: 102 mg/dL — AB (ref 65–99)
Glucose-Capillary: 112 mg/dL — ABNORMAL HIGH (ref 65–99)
Glucose-Capillary: 104 mg/dL — ABNORMAL HIGH (ref 65–99)
Glucose-Capillary: 109 mg/dL — ABNORMAL HIGH (ref 65–99)

## 2015-07-21 LAB — C DIFFICILE QUICK SCREEN W PCR REFLEX
C DIFFICILE (CDIFF) INTERP: NEGATIVE
C DIFFICILE (CDIFF) TOXIN: NEGATIVE
C Diff antigen: NEGATIVE

## 2015-07-21 LAB — ALDOSTERONE + RENIN ACTIVITY W/ RATIO
ALDO / PRA Ratio: <0.4 (ref 0.0–30.0)
PRA LC/MS/MS: 2.734 ng/mL/hr (ref 0.167–5.380)

## 2015-07-21 LAB — CULTURE, BLOOD (ROUTINE X 2)
CULTURE: NO GROWTH
Culture: NO GROWTH

## 2015-07-21 LAB — MAGNESIUM: MAGNESIUM: 2.1 mg/dL (ref 1.7–2.4)

## 2015-07-21 LAB — CBC
HCT: 38.4 % (ref 36.0–46.0)
Hemoglobin: 11.4 g/dL — ABNORMAL LOW (ref 12.0–15.0)
MCH: 21.6 pg — AB (ref 26.0–34.0)
MCHC: 29.7 g/dL — AB (ref 30.0–36.0)
MCV: 72.9 fL — ABNORMAL LOW (ref 78.0–100.0)
Platelets: 339 10*3/uL (ref 150–400)
RBC: 5.27 MIL/uL — AB (ref 3.87–5.11)
RDW: 19.5 % — ABNORMAL HIGH (ref 11.5–15.5)
WBC: 10.4 10*3/uL (ref 4.0–10.5)

## 2015-07-21 LAB — CULTURE, RESPIRATORY W GRAM STAIN

## 2015-07-21 LAB — VANCOMYCIN, TROUGH: VANCOMYCIN TR: 27 ug/mL — AB (ref 10.0–20.0)

## 2015-07-21 LAB — CULTURE, RESPIRATORY

## 2015-07-21 MED ORDER — POTASSIUM CHLORIDE IN NACL 20-0.9 MEQ/L-% IV SOLN
INTRAVENOUS | Status: DC
  Administered 2015-07-21 (×2): via INTRAVENOUS
  Filled 2015-07-21 (×9): qty 1000

## 2015-07-21 MED ORDER — LORAZEPAM 2 MG/ML IJ SOLN: 1.0000 mg | INTRAMUSCULAR | Status: DC | PRN

## 2015-07-21 MED ORDER — VITAL HIGH PROTEIN PO LIQD
1000.0000 mL | ORAL | Status: DC
  Administered 2015-07-22 – 2015-07-24 (×3): 1000 mL

## 2015-07-21 MED ORDER — BUSPIRONE HCL 15 MG PO TABS
7.5000 mg | ORAL_TABLET | Freq: Two times a day (BID) | ORAL | Status: DC
  Administered 2015-07-21 – 2015-07-24 (×7): 7.5 mg via ORAL
  Filled 2015-07-21 (×8): qty 1

## 2015-07-21 MED ORDER — POTASSIUM CHLORIDE 20 MEQ PO PACK
20.0000 meq | PACK | Freq: Two times a day (BID) | ORAL | Status: AC
  Administered 2015-07-21 (×2): 20 meq via ORAL
  Filled 2015-07-21 (×2): qty 1

## 2015-07-21 NOTE — Progress Notes (Signed)
STROKE TEAM PROGRESS NOTE   SUBJECTIVE (INTERVAL HISTORY) No family at bedside RN at bedside.  Discussed:  Cleviprex off since 2AM  Skin appears more juandice  Stool is liquid;   Worsening azotemia.  Apparently with every free water bolus patient dumps liquid stool  Hypokalemia  HR 30-100 yesterday, thus concerns for trying Precedex instead of Propofol.  Without Propofol apparently her RR increases significantly   OBJECTIVE Temp:  [99.6 F (37.6 C)-100.9 F (38.3 C)] 99.6 F (37.6 C) (06/10 0700) Pulse Rate:  [30-100] 88 (06/10 0807) Cardiac Rhythm:  [-] Normal sinus rhythm (06/10 0000) Resp:  [17-32] 27 (06/10 0807) BP: (122-177)/(60-107) 176/80 mmHg (06/10 0807) SpO2:  [96 %-100 %] 99 % (06/10 0807) FiO2 (%):  [35 %-100 %] 35 % (06/10 0807) Weight:  [116.5 kg (256 lb 13.4 oz)] 116.5 kg (256 lb 13.4 oz) (06/10 0448)  CBC:   Recent Labs Lab 07/20/15 0306 07/21/15 0325  WBC 12.7* 10.4  HGB 11.0* 11.4*  HCT 37.2 38.4  MCV 73.4* 72.9*  PLT 360 485    Basic Metabolic Panel:   Recent Labs Lab 07/15/15 0550 07/16/15 0220  07/20/15 0306 07/21/15 0325  NA 144  --   < > 145 142  K 3.1*  --   < > 3.4* 3.3*  CL 109  --   < > 107 107  CO2 25  --   < > 26 22  GLUCOSE 113*  --   < > 102* 104*  BUN 17  --   < > 34* 44*  CREATININE 0.81  --   < > 1.07* 1.17*  CALCIUM 8.9  --   < > 9.1 8.7*  MG 1.8 1.8  --   --  2.1  PHOS 3.1 2.8  --   --   --   < > = values in this interval not displayed.  Lipid Panel:     Component Value Date/Time   CHOL 187 07/13/2015 1057   TRIG 149 07/19/2015 1025   HDL 60 07/13/2015 1057   CHOLHDL 3.1 07/13/2015 1057   VLDL 20 07/13/2015 1057   LDLCALC 107* 07/13/2015 1057   HgbA1c:  Lab Results  Component Value Date   HGBA1C 5.5 07/13/2015   Urine Drug Screen:     Component Value Date/Time   LABOPIA NONE DETECTED 07/16/2015 1414   COCAINSCRNUR NONE DETECTED 07/16/2015 1414   LABBENZ NONE DETECTED 07/16/2015 1414   AMPHETMU  NONE DETECTED 07/16/2015 1414   THCU NONE DETECTED 07/16/2015 1414   LABBARB NONE DETECTED 07/16/2015 1414      IMAGING Blue text is Dr. Phoebe Sharps interpretation  Ct Angio Head and neck W/cm &/or Wo Cm 07/13/2015  IMPRESSION: Stable acute small volume parenchymal hemorrhages as described in the LEFT pons and LEFT frontal operculum. No underlying extracranial or intracranial stenosis, vascular malformation or aneurysm. No evidence for venous sinus thrombosis. Extensive hypoattenuation of white matter, likely severe chronic microvascular ischemic change related to hypertension. 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  Ct Head Wo Contrast 07/14/2015  IMPRESSION: 1. Overall no significant interval change in left frontal opercular hemorrhage with mild localized edema. 2. Stable 6 mm left pontine hemorrhage without significant edema. 3. No other new acute intracranial process.  07/12/2015  IMPRESSION: 1. Acute hemorrhage at the left parietal lobe, measuring 1.7 x 1.3 cm. 2. Small focus of acute hemorrhage at the left side of the pons, measuring 0.6 cm. 3. Scattered small  vessel ischemic microangiopathy and mild chronic ischemic change at the basal ganglia bilaterally.  TTE - Left ventricle: The cavity size was normal. Wall thickness was normal. Systolic function was normal. The estimated ejection fraction was in the range of 60% to 65%. Wall motion was normal; there were no regional wall motion abnormalities. Doppler parameters are consistent with abnormal left ventricular relaxation (grade 1 diastolic dysfunction). - Aortic valve: There was mild to moderate stenosis. Valve area (VTI): 1.85 cm^2. Valve area (Vmax): 1.42 cm^2. Valve area (Vmean): 1.39 cm^2. - Mitral valve: Severely calcified annulus. The findings are consistent with mild stenosis. - Left atrium: The atrium was mildly dilated.  MRI brain Posterior left frontal opercular 2 cm  hematoma. Left upper pons 9 mm hematoma. These hemorrhage appear acute/ subacute. Mild vasogenic edema surrounds left frontal lobe hematoma. Scattered small chronic blood breakdown products posterior limb right internal capsule, right external capsule, posterior right  opercular region, anterior right frontal lobe, left temporal lobe, right occipital lobe and cerebellum bilaterally. Intracranial hemorrhage most likely related to hypertension given the present clinical setting and surrounding findings. Recommend follow-up until complete clearance. Small acute/subacute nonhemorrhagic infarcts frontal and parietal lobes bilaterally. Remote bilateral thalamic, bilateral centrum semiovale/ corona radiata, bilateral basal ganglia and right pontine infarcts. Marked confluent white matter changes most consistent with result of chronic microvascular disease. Linear dural enhancement of indeterminate etiology described in several benign settings. Opacification petrous apex and partial opacification left mastoid air cells without obstructing lesion of the eustachian tube noted. No definitive findings to suggest petrous a bursitis. If the patient developed cranial nerve symptoms then this possibly would need to be considered.  Autoimmune work up - elevated ESR, CRP and RF, others negative.  Renal A duplex - Right kidney is asymmetrically smaller than left and has hyperechoic appearance. Minimal arterial flow obtained in right renal artery and intrarenal flow, which is noted to be atypical. No apparent renal artery stenosis on the left.  ROS:  Only complaint is mid-epigatric pain  PHYSICAL EXAM General:    Slight discomfort; Awake and alert.  Mute.  Obese Cardiovascular:  RRR Pulmonary: CTA Abdomen: obese, soft; diffuse mild tenderness Extremities: No C/C/E  Neurological Exam Mental Status:  Awake, alert follows simple commands.  Appears depressed Orientation:  Nods and shakes head appropriately.  Knows  self and place Speech:  Mute  Cranial Nerves:  Pupils equal both reactive. Fundi were not visualized. B/l incomplete abduction. No nystagmus.  Smile symmetric; unable to produce cough  Motor Exam:  Tone:  Increased throughout Power:  LUE and LLE 3+/5, RUE 3+/5 and RLE and LLE 2/5.  Sensory: Reports symmetric to LT throughout  Coordination:  defered  Gait:defered  ASSESSMENT/PLAN Ms. Tashima Scarpulla is a 50 y.o. female with history of HTN and noncompliance presenting with speech difficulty and R sided weakness following a fall. BP elevated. CT showed a L parietal and small pontine hemorrhage could be due to malignant hypertension, however, CNS vasculitis is in DDx.   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 However, location of ICH is really atypical. 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  MRI multifocal microbleeds and microinfarcts, concerning for HTN vs. Vasculitis  Consider LP or cerebral angio once pt more stabilized.   2D  Echo  EF 60-65%   SCDs and lovenox for VTE prophylaxis  No antithrombotic prior to admission  Ongoing aggressive stroke risk factor management  Therapy recommendations:  CIR. They are following at a distance until ready for higher level of activity  Disposition:  pending   Hypertensive Emergency  BP 280/150 on arrival to hospital in setting of neuro symptoms  On cardene, but d/c due to large volume  Put on cleviprex, at max dose today  Put on amlodipine 10 qd, HCTZ 25 qd, lisinopril 20 bid, metoprolol 75 bid and hydralazine 50 q8  Increase metoprolol to 100 bid  Increase SBP goal to < 180  Renal artery duplex - small R kidney w/ right  renal artery lack of flow   Renal consulted and feels HTN not related to renal disease but directly associated with ICH and long-standing HTN  Malignant HTN work up - pending  Pneumonia / respiratory failure  Intubated and on vent  CCM on board  Febrile   Blood culture no growth thus far  On zosyn and vanco  Leukocytosis 11.3->14.7->16.7-> 12.7->10.4 (improved)  Copious scecretions  Not able to extubate at this time  CCM placing trach this am   Other Stroke Risk Factors  Morbid Obesity, Body mass index is 41.47 kg/(m^2).   Other Active Problems   Hypokalemia - supplemented  Hypernatremia and azotemia - free water continued and IV fluids added  Hospital day # 9   CRITICAL CARE NEUROLOGY ATTENDING NOTE Patient was seen and examined by me personally. I independently viewed imaging studies, participated in medical decision making and plan of care. The laboratory and radiographic studies were personally reviewed by me.  ROS completed by me personally and pertinent positives fully documented.  Assessment and plan completed by me personally and fully documented above.  Condition is unchanged    This patient is critically ill and at significant risk of neurological worsening, death and care requires constant monitoring of vital signs, hemodynamics,respiratory and cardiac monitoring, extensive review of multiple databases, frequent neurological assessment, discussion with family, other specialists and medical decision making of high complexity.  This critical care time does not reflect procedure time, or teaching time or supervisory time of PA/NP/Med Resident etc. but could involve care discussion time.  I spent 30 minutes of Neurocritical Care time in the care of  this patient.  Orders:  Buspar to address depression and also add anti-axiolytic  Repleted potassium for hypokalemia  Ordered 24-hr metanephrine  Ordered LFTs  Ordered c. Dif. PCR  Ordered maintenance  fluids for worsening azotemeia; will monitor fluid status given patient also receiving free water  Will follow-up on autoimmune work-up  SIGNED BY: Dr. Elissa Hefty     To contact Stroke Continuity provider, please refer to [email or web address] After hours, contact General Neurology

## 2015-07-21 NOTE — Progress Notes (Signed)
Vasculitis Labs  C3 - 125  (WNL) C4 - 29  (WNL) Rheum Factor - 17.5 (H) ESR - 45 (H) CRP - 17.5 (H) Myeloperoxidase Abs - (WNL) ANCA Proteinase 3 - (WNL) Anti-DNA antibody - (WNL) ANA Ab IFA - neg. ANCA screen with reflex titer - neg. Sjogren's syndrome antibody SSA - (WNL) Sjogren's syndrome antibody SSB - (WNL)   Mikey Bussing PA-C [facility] Pager [phone] 07/21/2015, 9:57 AM

## 2015-07-21 NOTE — Progress Notes (Signed)
PULMONARY / CRITICAL CARE MEDICINE   Name: Gwendolyn SimonSherry Dibble MRN: [id number] DOB: [birth year]    ADMISSION DATE:  07/12/2015 CONSULTATION DATE:  07/13/2015  REFERRING MD:  Dr. [name]  CHIEF COMPLAINT:  Fall  HISTORY OF PRESENT ILLNESS:   50 yo female presented to ER after falling.  She had difficulty with speech and Rt sided weakness.  She had BP 280/150.  CT head showed 1.7 x 1.3 cm Lt parietal hemorrhage and 0.6 cm lateral pons hemorrhage.  NIH stroke score 10.  She was started on nicardipine for BP control, and admitted by stroke service.  She was noted to having snoring respiratory pattern, poor gag reflex, and intermittent hypoxia. Required intubation.  SUBJECTIVE:  No events overnight, much more alert and interactive but gets anxious without propofol. Jaundiced this AM with bili of 4.6.  VITAL SIGNS: BP 172/81 mmHg  Pulse 80  Temp(Src) 99.6 F (37.6 C) (Rectal)  Resp 27  Ht 5\' 6"  (1.676 m)  Wt 116.5 kg (256 lb 13.4 oz)  BMI 41.47 kg/m2  SpO2 97%  HEMODYNAMICS:    VENTILATOR SETTINGS: Vent Mode:  [-] PRVC FiO2 (%):  [35 %-60 %] 35 % Set Rate:  [14 bmp] 14 bmp Vt Set:  [470 mL] 470 mL PEEP:  [5 cmH20] 5 cmH20 Plateau Pressure:  [9 cmH20-15 cmH20] 15 cmH20  INTAKE / OUTPUT: I/O last 3 completed shifts: In: 4110.9 [I.V.:1533.9; NG/GT:1777; IV Piggyback:800] Out: 4790 [Urine:3640; Stool:1150]  PHYSICAL EXAMINATION: General:  Awake, comfortable Neuro:  Follows commands, tracks and answers questions. HEENT: No JVD, ETT-> vent Cardiovascular:  s1 s2 RRT no r Lungs:  Ronchi, decreased in bases Abdomen:  Soft, obese, NT,  Musculoskeletal:  No sig edema Skin:  No rash, jaundiced, mildly.  LABS:  BMET  Recent Labs Lab 07/19/15 1802 07/20/15 0306 07/21/15 0325  NA 145 145 142  K 3.2* 3.4* 3.3*  CL 109 107 107  CO2 26 26 22   BUN 23* 34* 44*  CREATININE 0.91 1.07* 1.17*  GLUCOSE 107* 102* 104*    Electrolytes  Recent Labs Lab 07/14/15 1637 07/15/15 0550  07/16/15 0220  07/19/15 1802 07/20/15 0306 07/21/15 0325  CALCIUM  --  8.9  --   < > 8.8* 9.1 8.7*  MG 1.9 1.8 1.8  --   --   --  2.1  PHOS 2.6 3.1 2.8  --   --   --   --   < > = values in this interval not displayed.  CBC  Recent Labs Lab 07/18/15 0618 07/20/15 0306 07/21/15 0325  WBC 16.7* 12.7* 10.4  HGB 10.2* 11.0* 11.4*  HCT 34.8* 37.2 38.4  PLT 406* 360 339    Coag's No results for input(s): APTT, INR in the last 168 hours.  Sepsis Markers No results for input(s): LATICACIDVEN, PROCALCITON, O2SATVEN in the last 168 hours.  ABG No results for input(s): PHART, PCO2ART, PO2ART in the last 168 hours.  Liver Enzymes  Recent Labs Lab 07/21/15 1016  AST 178*  ALT 194*  ALKPHOS 240*  BILITOT 4.6*  ALBUMIN 2.6*    Cardiac Enzymes No results for input(s): TROPONINI, PROBNP in the last 168 hours.  Glucose  Recent Labs Lab 07/20/15 1454 07/20/15 1911 07/20/15 2308 07/21/15 0342 07/21/15 0727 07/21/15 1149  GLUCAP 105* 103* 116* 107* 102* 112*    Imaging Dg Chest Port 1 View  07/21/2015  CLINICAL DATA:  50 year old female with a history of respiratory failure EXAM: PORTABLE CHEST 1 VIEW COMPARISON:  07/20/2015,  07/19/2015 FINDINGS: Cardiomediastinal silhouette unchanged. Improved aeration on the left, with resolving airspace disease in the left mid lung. Retrocardiac opacity. Unchanged position of tracheostomy tube and enteric feeding tube which terminates out of the field of view. Surgical tubing/appliance projecting over the right chest on the prior study is no longer present. IMPRESSION: Improving aeration on the left, with persistent low lung volumes and likely basilar atelectasis/consolidation. Unchanged tracheostomy tube and enteric feeding tube. Signed, Yvone Neu. Loreta Ave, DO [facility] Radiology Electronically Signed   By: Gilmer Mor D.O.   On: 07/21/2015 08:31     STUDIES:  6/01 CT head >> 1.7 x 1.3  cm Lt parietal ICH, 0.6 cm Lt pons ICH, scattered small vessel ischemia 6/03 Echo >> LVEF 65%, grade 1 diastolic dysfxn MRI Brain 6/6 >> L frontal occipital hematoma 2cm, L upper pons 9mm, mild vasogenic edema.   CULTURES: Blood 6/5 >>  Blood 6/7 >>  resp 6/8 >> moderate GPC >>   ANTIBIOTICS: Pip/tazo 6/7 >>  vanco 6/7 >>   SIGNIFICANT EVENTS: 6/01 Admit 6/2- HTN, poor airway control, intubated 6/4 very awake  LINES/TUBES: 6/2 ett>>6/9 Janina Mayo Ninetta Lights) 6/9>>>  DISCUSSION: 50 yo female with Lt parietal and Lt pontine hemorrhage in setting of HTN urgency.  Now with snoring respirations, poor gag reflex, and intermittent hypoxia. 6/2 intubated for airway protection. Concern for UA swelling and irritation. MS improved. LLL HCAP. Plan for trach on 6/9  ASSESSMENT / PLAN:  NEUROLOGIC A:   Acute Lt parietal and pontine hemorrhage P:   RASS goal: 0 to -1 Airway secured 6/2 with ETT neurochecks  Propofol for sedation Imaging per neuro > CT, MRI brain as above. MS improved. BP control.  PULMONARY A: Acute hypoxic respiratory failure Upper airway, poor control secretions LLL PNA P:   F/u CXR Her secretions are improved but remain concerned that she is high risk for UA obstruction, especially on ACE-I TC as tolerated.  CARDIOVASCULAR A:  HTN urgency. P:  Clevidipine to keep SBP < 160, off now Amlodipine 10, hydralazine scheduled, lisinopril, metop 75 bid,  Echo ef 65% g1df Hopefully trach will make her more comfortable, will allow pressures to decrease Consider addition clonidine if remains hypertensive  RENAL  Recent Labs Lab 07/19/15 1802 07/20/15 0306 07/21/15 0325  K 3.2* 3.4* 3.3*   A:   Hypokalemia. P:   Replace electrolytes as needed Continue scheduled diuresis, goal net negative Follow BMP  GASTROINTESTINAL A:   Dysphagia prior to intubation  Hyperbili and LFTs P:   Repeat LFTs in AM. D/C all offending agents Check coags. Panda and  feeds SLP  HEMATOLOGIC A:   DVT prevention P:  SCDs for DVT prophylaxis Follow CBC  INFECTIOUS A:   New LLL infiltrate, possible VAP / HCAP P:   D/C vanc. Continue zosyn. F/U on culture.  ENDOCRINE CBG (last 3)   Recent Labs  07/21/15 0342 07/21/15 0727 07/21/15 1149  GLUCAP 107* 102* 112*   A:   Mild hyperglycemia.  P:    Monitor blood sugar on BMET   FAMILY  - Updates: updated patient and family bedside 65/10.  - Inter-disciplinary family meet or Palliative Care meeting due by: 6/9  The patient is critically ill with multiple organ systems failure and requires high complexity decision making for assessment and support, frequent evaluation and titration of therapies, application of advanced monitoring technologies and extensive interpretation of multiple databases.   Critical Care Time devoted to patient care services described  in this note is  35  Minutes. This time reflects time of care of this signee Dr Jennet Maduro. This critical care time does not reflect procedure time, or teaching time or supervisory time of PA/NP/Med student/Med Resident etc but could involve care discussion time.  Rush Farmer, M.D. [facility] Pulmonary/Critical Care Medicine. Pager: [phone]. After hours pager: [phone].

## 2015-07-21 NOTE — Progress Notes (Signed)
SLP Cancellation Note  Patient Details Name: Gwendolyn SimonSherry Morales MRN: [id number] DOB: 15-Apr-1965   Cancelled treatment:       Reason Eval/Treat Not Completed: Medical issues which prohibited therapy. Patient continues with trach and will need a PMV eval prior to BSE. Per patient's RN, MD is planning to possibly cap the trach at the beginning of next week. Speech will continue to follow.   Angela NevinJohn T. Preston, MA, CCC-SLP 07/21/2015 4:18 PM

## 2015-07-21 NOTE — Progress Notes (Signed)
Nutrition Follow-up  DOCUMENTATION CODES:  Morbid obesity  INTERVENTION:  Increase VHP to 60 cc/hr D/C prostat  Provides: 1440 kcal, 126 grams protein, and 1204 ml free water   NUTRITION DIAGNOSIS:  Inadequate oral intake related to inability to eat as evidenced by NPO status. Ongoing.   GOAL:  Provide needs based on ASPEN/SCCM guidelines Met.   MONITOR:  Vent status, Labs, Weight trends, I & O's, TF tolerance  ASSESSMENT:  50 y/o female PHMx HTN presented after falling at home and afterwards haiving difficulty with speech+ R side weajnes. CT showed intraparenchymal hemorrhages. Intubated 6/2 due to poor airway protection. Nutrition consulted for TF.   6/9 trach and Cortrak placed (tip distal stomach)   Pt states she is feeling no nausea.   Abdomen slightly firm.   Temp (24hrs), Avg:100.4 F (38 C), Min:99.6 F (37.6 C), Max:100.9 F (38.3 C)  RN flagged RD as IVFE have been stopped due to decrease in liver function.  TF regimen readjusted.   Medications reviewed.  Fluids NS/kcl 75 ml/hr, + free water 400 q 4  Labs reviewed: TG 149, Albumin:  2.6, Bili: 46, Alk phos:240, elevated lfts  Recent Labs Lab 07/14/15 1637 07/15/15 0550 07/16/15 0220  07/19/15 1802 07/20/15 0306 07/21/15 0325  NA  --  144  --   < > 145 145 142  K  --  3.1*  --   < > 3.2* 3.4* 3.3*  CL  --  109  --   < > 109 107 107  CO2  --  25  --   < > 26 26 22   BUN  --  17  --   < > 23* 34* 44*  CREATININE  --  0.81  --   < > 0.91 1.07* 1.17*  CALCIUM  --  8.9  --   < > 8.8* 9.1 8.7*  MG 1.9 1.8 1.8  --   --   --  2.1  PHOS 2.6 3.1 2.8  --   --   --   --   GLUCOSE  --  113*  --   < > 107* 102* 104*  < > = values in this interval not displayed.  Diet Order:  Diet NPO time specified  Skin:  Reviewed, no issues  Last BM:  6/9  Height:  Ht Readings from Last 1 Encounters:  07/16/15 5' 6"  (1.676 m)   Weight:  Wt Readings from Last 1 Encounters:  07/21/15 256 lb 13.4 oz (116.5 kg)    Ideal Body Weight:  59.1 kg  BMI:  Body mass index is 41.47 kg/(m^2).  Estimated Nutritional Needs:  Kcal:  1254-1596 (11-14 kcal/kg ABW) Protein:  >118-140 g (2g/kw ibw Fluid:  Per MD  EDUCATION NEEDS:  No education needs identified at this time  Burtis Junes RD, LDN, [place] Clinical Nutrition Pager: [phone] 07/21/2015 3:57 PM

## 2015-07-22 DIAGNOSIS — R05 Cough: Secondary | ICD-10-CM

## 2015-07-22 DIAGNOSIS — I10 Essential (primary) hypertension: Secondary | ICD-10-CM | POA: Insufficient documentation

## 2015-07-22 DIAGNOSIS — Z930 Tracheostomy status: Secondary | ICD-10-CM | POA: Insufficient documentation

## 2015-07-22 DIAGNOSIS — R058 Other specified cough: Secondary | ICD-10-CM | POA: Insufficient documentation

## 2015-07-22 LAB — PROTIME-INR
INR: 1.14 (ref 0.00–1.49)
Prothrombin Time: 14.8 seconds (ref 11.6–15.2)

## 2015-07-22 LAB — HEPATIC FUNCTION PANEL
ALBUMIN: 2.3 g/dL — AB (ref 3.5–5.0)
ALK PHOS: 260 U/L — AB (ref 38–126)
ALT: 186 U/L — AB (ref 14–54)
AST: 174 U/L — ABNORMAL HIGH (ref 15–41)
BILIRUBIN INDIRECT: 1 mg/dL — AB (ref 0.3–0.9)
Bilirubin, Direct: 3.1 mg/dL — ABNORMAL HIGH (ref 0.1–0.5)
TOTAL PROTEIN: 6.3 g/dL — AB (ref 6.5–8.1)
Total Bilirubin: 4.1 mg/dL — ABNORMAL HIGH (ref 0.3–1.2)

## 2015-07-22 LAB — CBC
HEMATOCRIT: 35.1 % — AB (ref 36.0–46.0)
Hemoglobin: 10.3 g/dL — ABNORMAL LOW (ref 12.0–15.0)
MCH: 21.5 pg — AB (ref 26.0–34.0)
MCHC: 29.3 g/dL — ABNORMAL LOW (ref 30.0–36.0)
MCV: 73.3 fL — AB (ref 78.0–100.0)
Platelets: 361 10*3/uL (ref 150–400)
RBC: 4.79 MIL/uL (ref 3.87–5.11)
RDW: 19.4 % — AB (ref 11.5–15.5)
WBC: 8.6 10*3/uL (ref 4.0–10.5)

## 2015-07-22 LAB — MAGNESIUM: Magnesium: 2.1 mg/dL (ref 1.7–2.4)

## 2015-07-22 LAB — GLUCOSE, CAPILLARY
GLUCOSE-CAPILLARY: 111 mg/dL — AB (ref 65–99)
Glucose-Capillary: 104 mg/dL — ABNORMAL HIGH (ref 65–99)

## 2015-07-22 LAB — BASIC METABOLIC PANEL
ANION GAP: 10 (ref 5–15)
BUN: 31 mg/dL — AB (ref 6–20)
CALCIUM: 8.9 mg/dL (ref 8.9–10.3)
CO2: 24 mmol/L (ref 22–32)
Chloride: 105 mmol/L (ref 101–111)
Creatinine, Ser: 0.85 mg/dL (ref 0.44–1.00)
GFR calc Af Amer: >60 mL/min (ref >60)
GFR calc non Af Amer: >60 mL/min (ref >60)
GLUCOSE: 114 mg/dL — AB (ref 65–99)
Potassium: 3.1 mmol/L — ABNORMAL LOW (ref 3.5–5.1)
Sodium: 139 mmol/L (ref 135–145)

## 2015-07-22 LAB — PHOSPHORUS: Phosphorus: 2.8 mg/dL (ref 2.5–4.6)

## 2015-07-22 LAB — TRIGLYCERIDES: Triglycerides: 525 mg/dL — ABNORMAL HIGH (ref <150)

## 2015-07-22 MED ORDER — POTASSIUM CHLORIDE 20 MEQ PO PACK
40.0000 meq | PACK | ORAL | Status: AC
  Administered 2015-07-22 (×2): 40 meq via ORAL
  Filled 2015-07-22 (×2): qty 2

## 2015-07-22 MED ORDER — CHLORHEXIDINE GLUCONATE 0.12 % MT SOLN
15.0000 mL | Freq: Two times a day (BID) | OROMUCOSAL | Status: DC
  Administered 2015-07-22 – 2015-07-24 (×5): 15 mL via OROMUCOSAL
  Filled 2015-07-22 (×3): qty 15

## 2015-07-22 MED ORDER — CHLORHEXIDINE GLUCONATE 0.12 % MT SOLN: 15.0000 mL | Freq: Two times a day (BID) | OROMUCOSAL | Status: DC

## 2015-07-22 MED ORDER — LOPERAMIDE HCL 1 MG/5ML PO LIQD
2.0000 mg | ORAL | Status: DC | PRN
  Administered 2015-07-22 – 2015-07-24 (×3): 2 mg
  Filled 2015-07-22 (×3): qty 10

## 2015-07-22 MED ORDER — MORPHINE SULFATE (PF) 2 MG/ML IV SOLN
1.0000 mg | Freq: Four times a day (QID) | INTRAVENOUS | Status: DC | PRN
  Administered 2015-07-22: 1 mg via INTRAVENOUS
  Filled 2015-07-22: qty 1

## 2015-07-22 MED ORDER — POTASSIUM CHLORIDE 20 MEQ PO PACK
20.0000 meq | PACK | Freq: Two times a day (BID) | ORAL | Status: DC
  Administered 2015-07-23 – 2015-07-24 (×3): 20 meq via ORAL
  Filled 2015-07-22 (×4): qty 1

## 2015-07-22 MED ORDER — CETYLPYRIDINIUM CHLORIDE 0.05 % MT LIQD
7.0000 mL | Freq: Two times a day (BID) | OROMUCOSAL | Status: DC
  Administered 2015-07-22 – 2015-07-24 (×4): 7 mL via OROMUCOSAL

## 2015-07-22 NOTE — Progress Notes (Signed)
ATC setup equipment changed. Trach care done. Sutures in place

## 2015-07-22 NOTE — Progress Notes (Addendum)
Decreased FIO2 28% due to stable sats. Sutures in place. Trach secure. No breakdown noted at trach site

## 2015-07-22 NOTE — Progress Notes (Addendum)
PULMONARY / CRITICAL CARE MEDICINE   Name: Kristopher Delk MRN: [id number] DOB: 25-Feb-1965    ADMISSION DATE:  07/12/2015 CONSULTATION DATE:  07/13/2015  REFERRING MD:  Dr. [name]  CHIEF COMPLAINT:  Fall  HISTORY OF PRESENT ILLNESS:   50 yo female presented to ER after falling.  She had difficulty with speech and Rt sided weakness.  She had BP 280/150.  CT head showed 1.7 x 1.3 cm Lt parietal hemorrhage and 0.6 cm lateral pons hemorrhage.  NIH stroke score 10.  She was started on nicardipine for BP control, and admitted by stroke service.  She was noted to having snoring respiratory pattern, poor gag reflex, and intermittent hypoxia. Required intubation.  SUBJECTIVE:  No events overnight, much more alert and interactive but gets anxious without propofol. Jaundiced this AM with bili of 4.6.  VITAL SIGNS: BP 143/70 mmHg  Pulse 84  Temp(Src) 98.9 F (37.2 C) (Oral)  Resp 28  Ht  (1.676 m)  Wt 115.9 kg (255 lb 8.2 oz)  BMI 41.26 kg/m2  SpO2 98%  HEMODYNAMICS:    VENTILATOR SETTINGS: Vent Mode:  [-]  FiO2 (%):  [28 %-35 %] 28 %  INTAKE / OUTPUT: I/O last 3 completed shifts: In: 6373.8 [I.V.:1882.5; NG/GT:4091.3; IV Piggyback:400] Out: 4690 [Urine:3340; Stool:1350]  PHYSICAL EXAMINATION: General:  Awake, comfortable Neuro:  Follows commands, tracks and answers questions. HEENT: No JVD, ETT-> vent Cardiovascular:  s1 s2 RRT no r Lungs:  Ronchi, decreased in bases Abdomen:  Soft, obese, NT,  Musculoskeletal:  No sig edema Skin:  No rash, jaundiced, mildly.  LABS:  BMET  Recent Labs Lab 07/20/15 0306 07/21/15 0325 07/22/15 0405  NA 145 142 139  K 3.4* 3.3* 3.1*  CL 107 107 105  CO2 BUN 34* 44* 31*  CREATININE 1.07* 1.17* 0.85  GLUCOSE 102* 104* 114*    Electrolytes  Recent Labs Lab 07/16/15 0220  07/20/15 0306 07/21/15 0325 07/22/15 0405  CALCIUM  --   < > 9.1 8.7* 8.9  MG 1.8  --   --  2.1 2.1  PHOS 2.8  --   --   --  2.8  < > =  values in this interval not displayed.  CBC  Recent Labs Lab 07/20/15 0306 07/21/15 0325 07/22/15 0405  WBC 12.7* 10.4 8.6  HGB 11.0* 11.4* 10.3*  HCT 37.2 38.4 35.1*  PLT 360 339 361    Coag's  Recent Labs Lab 07/22/15 0405  INR 1.14    Sepsis Markers No results for input(s): LATICACIDVEN, PROCALCITON, O2SATVEN in the last 168 hours.  ABG No results for input(s): PHART, PCO2ART, PO2ART in the last 168 hours.  Liver Enzymes  Recent Labs Lab 07/21/15 1016 07/22/15 0405  AST 178* 174*  ALT 194* 186*  ALKPHOS 240* 260*  BILITOT 4.6* 4.1*  ALBUMIN 2.6* 2.3*    Cardiac Enzymes No results for input(s): TROPONINI, PROBNP in the last 168 hours.  Glucose  Recent Labs Lab 07/21/15 0727 07/21/15 1149 07/21/15 1614 07/21/15 1924 07/22/15 0349 07/22/15 0716  GLUCAP 102* 112* 104* 109* 111* 104*    Imaging No results found.   STUDIES:  6/01 CT head >> 1.7 x 1.3 cm Lt parietal ICH, 0.6 cm Lt pons ICH, scattered small vessel ischemia 6/03 Echo >> LVEF 65%, grade 1 diastolic dysfxn MRI Brain 6/6 >> L frontal occipital hematoma 2cm, L upper pons 9mm, mild vasogenic edema.   CULTURES: Blood 6/5 >>  Blood 6/7 >>  resp  6/8 >> moderate GPC >>   ANTIBIOTICS: Pip/tazo 6/7 >>  vanco 6/7 >>   SIGNIFICANT EVENTS: 6/01 Admit 6/2- HTN, poor airway control, intubated 6/4 very awake  LINES/TUBES: 6/2 ett>>6/9 Janina Mayorach Ninetta Lights(JY) 6/9>>>  DISCUSSION: 50 yo female with Lt parietal and Lt pontine hemorrhage in setting of HTN urgency.  Now with snoring respirations, poor gag reflex, and intermittent hypoxia. 6/2 intubated for airway protection. Concern for UA swelling and irritation. MS improved. LLL HCAP. Plan for trach on 6/9  ASSESSMENT / PLAN:  NEUROLOGIC A:   Acute Lt parietal and pontine hemorrhage P:   RASS goal: 0 to -1 Neurochecks  Sedation as below. Imaging per neuro > CT, MRI brain as above. MS improved. BP control.  PULMONARY A: Acute hypoxic  respiratory failure Upper airway, poor control secretions LLL PNA P:   F/u CXR Her secretions are improved but remain concerned that she is high risk for UA obstruction, especially on ACE-I Hopefully can stay off vent. Apply PMV.  CARDIOVASCULAR A:  HTN urgency. P:  Clevidipine to keep SBP < 160, off now Amlodipine 10, hydralazine scheduled, lisinopril, metop 75 bid,  Echo ef 65% g1df Addition clonidine if remains hypertensive  RENAL  Recent Labs Lab 07/20/15 0306 07/21/15 0325 07/22/15 0405  K 3.4* 3.3* 3.1*   A:   Hypokalemia. P:   Replace electrolytes as needed Continue scheduled diuresis, goal net negative Follow BMP  GASTROINTESTINAL A:   Dysphagia prior to intubation  Hyperbili and LFTs P:   Monitor LFT and bilirubin. D/C all offending agents Check coags. Panda and feeds SLP pending  HEMATOLOGIC A:   DVT prevention P:  SCDs for DVT prophylaxis Follow CBC  INFECTIOUS A:   New LLL infiltrate, possible VAP / HCAP P:   D/C vanc. Continue zosyn. F/U on culture.  ENDOCRINE CBG (last 3)   Recent Labs  07/21/15 1924 07/22/15 0349 07/22/15 0716  GLUCAP 109* 111* 104*   A:   Mild hyperglycemia.  P:    Monitor blood sugar on BMET  FAMILY  - Updates: updated patient and family bedside 6/11.  - Inter-disciplinary family meet or Palliative Care meeting due by: 6/9  The patient is critically ill with multiple organ systems failure and requires high complexity decision making for assessment and support, frequent evaluation and titration of therapies, application of advanced monitoring technologies and extensive interpretation of multiple databases.   Critical Care Time devoted to patient care services described in this note is  35  Minutes. This time reflects time of care of this signee Dr Koren BoundWesam Yacoub. This critical care time does not reflect procedure time, or teaching time or supervisory time of PA/NP/Med student/Med Resident etc but could  involve care discussion time.  Alyson ReedyWesam G. Yacoub, M.D. [facility] Pulmonary/Critical Care Medicine. Pager: [phone]. After hours pager: [phone].

## 2015-07-22 NOTE — Progress Notes (Signed)
Pharmacy Antibiotic Note  Gwendolyn Morales is a 50 y.o. female admitted on 07/12/2015 with ICH.  Pt now showing LLL infiltrate and she continues on zosyn for VAP vs HCAP. She is growing MSSA in her trach aspirate culture and all other cultures are negative to date.   Plan: - Continue zosyn 3.375gm IV Q8H (4 hr in) - F/u renal fxn, C&S, clinical status  - Consider de-escalating to cefazolin to complete course of therapy  Height: 5\' 6"  (167.6 cm) Weight: 255 lb 8.2 oz (115.9 kg) IBW/kg (Calculated) : 59.3  Temp (24hrs), Avg:99.8 F (37.7 C), Min:98.9 F (37.2 C), Max:100.6 F (38.1 C)   Recent Labs Lab 07/17/15 0300 07/18/15 0618 07/19/15 0315 07/19/15 1802 07/20/15 0306 07/21/15 0325 07/21/15 1016 07/22/15 0405  WBC 14.7* 16.7*  --   --  12.7* 10.4  --  8.6  CREATININE 0.68 0.81 0.83 0.91 1.07* 1.17*  --  0.85  VANCOTROUGH  --   --   --   --   --   --  27*  --     Estimated Creatinine Clearance: 103.5 mL/min (by C-G formula based on Cr of 0.85).    No Known Allergies  Antimicrobials this admission: 6/7 vanc >6/10 6/7 zosyn >  Dose adjustments this admission: NA  Microbiology results: 6/5 BCx x2: NGx2D 6/7 BCx x2: ngtd 6/7 mrsa: neg 6/8 TA - MSSA   Thank you for allowing pharmacy to be a part of this patient's care.  Hendrixx Severin, Drake LeachRachel Lynn 07/22/2015 9:08 AM

## 2015-07-22 NOTE — Progress Notes (Signed)
STROKE TEAM PROGRESS NOTE   SUBJECTIVE (INTERVAL HISTORY) No family at bedside RN at bedside.  Discussed:  Propofol off since yesterday  Skin appears less juandice.  LFTs are elevated  Stool is liquid; C..dif negative  Imrpoving azotemia with IVFs  Hypokalemia worsening  OBJECTIVE Temp:  [98.9 F (37.2 C)-100.6 F (38.1 C)] 98.9 F (37.2 C) (06/11 0800) Pulse Rate:  [74-95] 82 (06/11 0800) Cardiac Rhythm:  [-] Normal sinus rhythm (06/11 0800) Resp:  [18-29] 28 (06/11 0800) BP: (113-183)/(71-125) 164/89 mmHg (06/11 0800) SpO2:  [96 %-100 %] 100 % (06/11 0800) FiO2 (%):  [35 %] 35 % (06/11 0800) Weight:  [115.9 kg (255 lb 8.2 oz)] 115.9 kg (255 lb 8.2 oz) (06/11 0417)  CBC:   Recent Labs Lab 07/21/15 0325 07/22/15 0405  WBC 10.4 8.6  HGB 11.4* 10.3*  HCT 38.4 35.1*  MCV 72.9* 73.3*  PLT 339 242    Basic Metabolic Panel:   Recent Labs Lab 07/16/15 0220  07/21/15 0325 07/22/15 0405  NA  --   < > 142 139  K  --   < > 3.3* 3.1*  CL  --   < > 107 105  CO2  --   < > 22 24  GLUCOSE  --   < > 104* 114*  BUN  --   < > 44* 31*  CREATININE  --   < > 1.17* 0.85  CALCIUM  --   < > 8.7* 8.9  MG 1.8  --  2.1 2.1  PHOS 2.8  --   --  2.8  < > = values in this interval not displayed.  Lipid Panel:     Component Value Date/Time   CHOL 187 07/13/2015 1057   TRIG 149 07/19/2015 1025   HDL 60 07/13/2015 1057   CHOLHDL 3.1 07/13/2015 1057   VLDL 20 07/13/2015 1057   LDLCALC 107* 07/13/2015 1057   HgbA1c:  Lab Results  Component Value Date   HGBA1C 5.5 07/13/2015   Urine Drug Screen:     Component Value Date/Time   LABOPIA NONE DETECTED 07/16/2015 1414   COCAINSCRNUR NONE DETECTED 07/16/2015 1414   LABBENZ NONE DETECTED 07/16/2015 1414   AMPHETMU NONE DETECTED 07/16/2015 1414   THCU NONE DETECTED 07/16/2015 1414   LABBARB NONE DETECTED 07/16/2015 1414      IMAGING Blue text is Dr. Phoebe Sharps interpretation  Ct Angio Head and neck W/cm &/or Wo Cm 07/13/2015   IMPRESSION: Stable acute small volume parenchymal hemorrhages as described in the LEFT pons and LEFT frontal operculum. No underlying extracranial or intracranial stenosis, vascular malformation or aneurysm. No evidence for venous sinus thrombosis. Extensive hypoattenuation of white matter, likely severe chronic microvascular ischemic change related to hypertension. 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  Ct Head Wo Contrast 07/14/2015  IMPRESSION: 1. Overall no significant interval change in left frontal opercular hemorrhage with mild localized edema. 2. Stable 6 mm left pontine hemorrhage without significant edema. 3. No other new acute intracranial process.  07/12/2015  IMPRESSION: 1. Acute hemorrhage at the left parietal lobe, measuring 1.7 x 1.3 cm. 2. Small focus of acute hemorrhage at the left side of the pons, measuring 0.6 cm. 3. Scattered small vessel ischemic microangiopathy and mild chronic ischemic change at the basal ganglia bilaterally.  TTE - Left ventricle: The cavity size was normal. Wall thickness was normal. Systolic function was normal. The estimated ejection fraction was in the range of 60%  to 65%. Wall motion was normal; there were no regional wall motion abnormalities. Doppler parameters are consistent with abnormal left ventricular relaxation (grade 1 diastolic dysfunction). - Aortic valve: There was mild to moderate stenosis. Valve area (VTI): 1.85 cm^2. Valve area (Vmax): 1.42 cm^2. Valve area (Vmean): 1.39 cm^2. - Mitral valve: Severely calcified annulus. The findings are consistent with mild stenosis. - Left atrium: The atrium was mildly dilated.  MRI brain Posterior left frontal opercular 2 cm hematoma. Left upper pons 9 mm hematoma. These hemorrhage appear acute/ subacute. Mild vasogenic edema surrounds left frontal lobe hematoma. Scattered small chronic blood breakdown products posterior limb right  internal capsule, right external capsule, posterior right  opercular region, anterior right frontal lobe, left temporal lobe, right occipital lobe and cerebellum bilaterally. Intracranial hemorrhage most likely related to hypertension given the present clinical setting and surrounding findings. Recommend follow-up until complete clearance. Small acute/subacute nonhemorrhagic infarcts frontal and parietal lobes bilaterally. Remote bilateral thalamic, bilateral centrum semiovale/ corona radiata, bilateral basal ganglia and right pontine infarcts. Marked confluent white matter changes most consistent with result of chronic microvascular disease. Linear dural enhancement of indeterminate etiology described in several benign settings. Opacification petrous apex and partial opacification left mastoid air cells without obstructing lesion of the eustachian tube noted. No definitive findings to suggest petrous a bursitis. If the patient developed cranial nerve symptoms then this possibly would need to be considered.  Autoimmune work up - elevated ESR, CRP and RF, others negative.  Renal A duplex - Right kidney is asymmetrically smaller than left and has hyperechoic appearance. Minimal arterial flow obtained in right renal artery and intrarenal flow, which is noted to be atypical. No apparent renal artery stenosis on the left.  ROS:  Only complaint is mid-epigatric pain  PHYSICAL EXAM General:    Slight discomfort; Awake and alert.  Mute.  Obese Cardiovascular:  RRR Pulmonary: CTA Abdomen: obese, soft; diffuse mild tenderness Extremities: No C/C/E  Neurological Exam Mental Status:  Awake, alert follows simple commands.  Appears depressed Orientation:  Nods and shakes head appropriately.  Knows self and place Speech:  Mute  Cranial Nerves:  Pupils equal both reactive. Fundi were not visualized. B/l incomplete abduction. No nystagmus.  Smile symmetric; unable to produce cough  Motor Exam:  Tone:   Increased throughout Power:  LUE and LLE 3+/5, RUE 3+/5 and RLE and LLE 2/5.  Sensory: Reports symmetric to LT throughout  Coordination:  defered  Gait:defered  ASSESSMENT/PLAN [name] is a 50 y.o. female with history of HTN and noncompliance presenting with speech difficulty and R sided weakness following a fall. BP elevated. CT showed a L parietal and small pontine hemorrhage could be due to malignant hypertension, however, CNS vasculitis is in DDx.   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 However, location of ICH is really atypical. 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  MRI multifocal microbleeds and microinfarcts, concerning for HTN vs. Vasculitis  Consider LP or cerebral angio once pt more stabilized.   2D Echo  EF 60-65%   SCDs and lovenox for VTE prophylaxis  No antithrombotic prior to admission  Ongoing aggressive stroke risk factor management  Therapy recommendations:  CIR. They are following at a distance until ready for higher level  of activity  Disposition:  pending   Hypertensive Emergency  BP 280/150 on arrival to hospital in setting of neuro symptoms  On cardene, but d/c due to large volume  Put on cleviprex, at max dose today  Put on amlodipine 10 qd, HCTZ 25 qd, lisinopril 20 bid, metoprolol 75 bid and hydralazine 50 q8  Increase metoprolol to 100 bid  Increase SBP goal to < 180  Renal artery duplex - small R kidney w/ right renal artery lack of flow   Renal consulted and feels HTN not related to renal disease but directly associated with ICH and long-standing HTN  Malignant HTN work up - pending  Pneumonia / respiratory  failure  Intubated and on vent  CCM on board  Febrile   Blood culture no growth thus far  On zosyn and vanco  Leukocytosis 11.3->14.7->16.7-> 12.7->10.4 (improved)  Copious scecretions  Not able to extubate at this time  CCM placing trach this am   Other Stroke Risk Factors  Morbid Obesity, Body mass index is 41.26 kg/(m^2).   Other Active Problems   Hypokalemia - supplemented  Hypernatremia and azotemia - free water continued and IV fluids added  Hospital day # 10   CRITICAL CARE NEUROLOGY ATTENDING NOTE Patient was seen and examined by me personally. I independently viewed imaging studies, participated in medical decision making and plan of care. The laboratory and radiographic studies were personally reviewed by me.  ROS completed by me personally and pertinent positives fully documented.  Assessment and plan completed by me personally and fully documented above.  Condition is unchanged    This patient is critically ill and at significant risk of neurological worsening, death and care requires constant monitoring of vital signs, hemodynamics,respiratory and cardiac monitoring, extensive review of multiple databases, frequent neurological assessment, discussion with family, other specialists and medical decision making of high complexity.  This critical care time does not reflect procedure time, or teaching time or supervisory time of PA/NP/Med Resident etc. but could involve care discussion time.  I spent 30 minutes of Neurocritical Care time in the care of  this patient.  Orders/Plans:  Continued Buspar to address depression and also add anti-axiolytic  Repleted potassium for hypokalemia  24-hr metanephrine will be completed today  LFTs reviewed.  Will minimize use of Tylenol when possible.  Added low dose Morphine for pain as needed.  Avoided Ibuprofen given ICH  Continued maintenance fluids.  Azotemeia improving  Autoimmune work-up c/w  Rheumatoid  SIGNED BY: Dr. Elissa Hefty     To contact Stroke Continuity provider, please refer to [email or web address] After hours, contact General Neurology

## 2015-07-23 ENCOUNTER — Inpatient Hospital Stay (HOSPITAL_COMMUNITY): Payer: MEDICAID

## 2015-07-23 DIAGNOSIS — E876 Hypokalemia: Secondary | ICD-10-CM

## 2015-07-23 DIAGNOSIS — F4323 Adjustment disorder with mixed anxiety and depressed mood: Secondary | ICD-10-CM

## 2015-07-23 DIAGNOSIS — E669 Obesity, unspecified: Secondary | ICD-10-CM | POA: Insufficient documentation

## 2015-07-23 DIAGNOSIS — R17 Unspecified jaundice: Secondary | ICD-10-CM | POA: Insufficient documentation

## 2015-07-23 DIAGNOSIS — I519 Heart disease, unspecified: Secondary | ICD-10-CM

## 2015-07-23 DIAGNOSIS — I610 Nontraumatic intracerebral hemorrhage in hemisphere, subcortical: Secondary | ICD-10-CM

## 2015-07-23 DIAGNOSIS — I69391 Dysphagia following cerebral infarction: Secondary | ICD-10-CM | POA: Insufficient documentation

## 2015-07-23 DIAGNOSIS — Z91199 Patient's noncompliance with other medical treatment and regimen due to unspecified reason: Secondary | ICD-10-CM | POA: Insufficient documentation

## 2015-07-23 DIAGNOSIS — I5189 Other ill-defined heart diseases: Secondary | ICD-10-CM | POA: Insufficient documentation

## 2015-07-23 DIAGNOSIS — I611 Nontraumatic intracerebral hemorrhage in hemisphere, cortical: Principal | ICD-10-CM

## 2015-07-23 DIAGNOSIS — Z930 Tracheostomy status: Secondary | ICD-10-CM

## 2015-07-23 DIAGNOSIS — D62 Acute posthemorrhagic anemia: Secondary | ICD-10-CM

## 2015-07-23 DIAGNOSIS — Z9119 Patient's noncompliance with other medical treatment and regimen: Secondary | ICD-10-CM

## 2015-07-23 DIAGNOSIS — I613 Nontraumatic intracerebral hemorrhage in brain stem: Secondary | ICD-10-CM | POA: Insufficient documentation

## 2015-07-23 DIAGNOSIS — R0682 Tachypnea, not elsewhere classified: Secondary | ICD-10-CM | POA: Insufficient documentation

## 2015-07-23 LAB — BASIC METABOLIC PANEL
ANION GAP: 9 (ref 5–15)
Anion gap: 8 (ref 5–15)
BUN: 19 mg/dL (ref 6–20)
BUN: 22 mg/dL — ABNORMAL HIGH (ref 6–20)
CHLORIDE: 105 mmol/L (ref 101–111)
CO2: 24 mmol/L (ref 22–32)
CO2: 23 mmol/L (ref 22–32)
Calcium: 8.8 mg/dL — ABNORMAL LOW (ref 8.9–10.3)
Calcium: 8.8 mg/dL — ABNORMAL LOW (ref 8.9–10.3)
Chloride: 103 mmol/L (ref 101–111)
Creatinine, Ser: 0.75 mg/dL (ref 0.44–1.00)
Creatinine, Ser: 0.73 mg/dL (ref 0.44–1.00)
GFR calc Af Amer: >60 mL/min (ref >60)
GFR calc Af Amer: >60 mL/min (ref >60)
GLUCOSE: 109 mg/dL — AB (ref 65–99)
GLUCOSE: 116 mg/dL — AB (ref 65–99)
POTASSIUM: 3.4 mmol/L — AB (ref 3.5–5.1)
POTASSIUM: 3.2 mmol/L — AB (ref 3.5–5.1)
SODIUM: 137 mmol/L (ref 135–145)
Sodium: 135 mmol/L (ref 135–145)

## 2015-07-23 LAB — CULTURE, BLOOD (ROUTINE X 2)
CULTURE: NO GROWTH
Culture: NO GROWTH

## 2015-07-23 LAB — CBC
HCT: 34 % — ABNORMAL LOW (ref 36.0–46.0)
Hemoglobin: 10.3 g/dL — ABNORMAL LOW (ref 12.0–15.0)
MCH: 22 pg — AB (ref 26.0–34.0)
MCHC: 30.3 g/dL (ref 30.0–36.0)
MCV: 72.6 fL — AB (ref 78.0–100.0)
PLATELETS: 330 10*3/uL (ref 150–400)
RBC: 4.68 MIL/uL (ref 3.87–5.11)
RDW: 19.4 % — AB (ref 11.5–15.5)
WBC: 7.5 10*3/uL (ref 4.0–10.5)

## 2015-07-23 LAB — GLUCOSE, CAPILLARY: GLUCOSE-CAPILLARY: 141 mg/dL — AB (ref 65–99)

## 2015-07-23 MED ORDER — POTASSIUM CHLORIDE 20 MEQ/15ML (10%) PO SOLN
40.0000 meq | Freq: Once | ORAL | Status: AC
  Administered 2015-07-23: 40 meq via ORAL
  Filled 2015-07-23: qty 30

## 2015-07-23 NOTE — Procedures (Signed)
First Trach Change  Sutures removed, size 6 cuffed trach removed and replaced with a size 6 cuffless.  Good color change and BS bilaterally.  Alyson ReedyWesam G. Yacoub, M.D. [facility] Pulmonary/Critical Care Medicine. Pager: [phone]. After hours pager: [phone].

## 2015-07-23 NOTE — Progress Notes (Signed)
I will follow up with pt and family regarding potential inpt rehab admission. [phone]

## 2015-07-23 NOTE — Evaluation (Signed)
Passy-Muir Speaking Valve - Evaluation Patient Details  Name: Gwendolyn SimonSherry Morales MRN: [id number] Date of Birth: 1965/08/14  Today's Date: 07/23/2015 Time: 0836-0900 SLP Time Calculation (min) (ACUTE ONLY): 24 min  Past Medical History:  Past Medical History  Diagnosis Date  . Hypertension    Past Surgical History: History reviewed. No pertinent past surgical history. HPI:  50 y.o. female with a history of hypertension and noncompliance with treatment brought to the ED and code stroke status following acute onset of a fall, speech difficulty and right-sided weakness. CT head showed a 1.7 x 1.3 cm acute left parietal hemorrhage and a 0.6 cm hemorrhage involving the left lateral pons. No CXR.    Assessment / Plan / Recommendation Clinical Impression  Pt exhibited strong reflexive cough to expell secretions not requiring deep suctioning per RN following cuff deflation. Difficulty synthesizing exhalation and phonation. Mild anxiety with increased work of breathing. Verbalized with audible phonation 3 times. No overt back pressure noted. RR 23-35. Recommend PMSV with SLP and RN only, ensure cuff deflation and FULL supervision. ST will continue to follow.       SLP Assessment  Patient needs continued Speech Lanaguage Pathology Services    Follow Up Recommendations  Inpatient Rehab    Frequency and Duration min 2x/week  2 weeks    PMSV Trial PMSV was placed for:  (approx 10) Able to redirect subglottic air through upper airway: Yes Able to Attain Phonation: Yes Voice Quality: Hoarse;Low vocal intensity Able to Expectorate Secretions: Yes Level of Secretion Expectoration with PMSV: Tracheal Breath Support for Phonation: Moderately decreased Intelligibility: Intelligibility reduced Word: 0-24% accurate Respirations During Trial:  (23-35) SpO2 During Trial:  (93-97) Pulse During Trial: 97 Behavior: Alert;Controlled;Cooperative   Tracheostomy Tube       Vent Dependency  FiO2 (%): 28  %    Cuff Deflation Trial  GO Tolerated Cuff Deflation: Yes Length of Time for Cuff Deflation Trial: 20 min Behavior: Alert;Controlled;Cooperative        Royce MacadamiaLitaker, Jagdeep Ancheta Willis 07/23/2015, 10:08 AM  Breck CoonsLisa Willis Lonell FaceLitaker M.Ed [employer] Pager [phone]

## 2015-07-23 NOTE — Progress Notes (Signed)
STROKE TEAM PROGRESS NOTE   SUBJECTIVE (INTERVAL HISTORY) No family at bedside RN at bedside.  Discussed: Propofol off since yesterday ST to swallow eval. Mobilize out of bed Replace potassium Rehab transfer next few days. Dr Nelda Marseille to downsize trach OBJECTIVE Temp:  [98.4 F (36.9 C)-100.2 F (37.9 C)] 98.7 F (37.1 C) (06/12 1600) Pulse Rate:  [74-97] 84 (06/12 1501) Cardiac Rhythm:  [-] Normal sinus rhythm (06/12 0800) Resp:  [19-37] 24 (06/12 1501) BP: (95-142)/(63-107) 138/77 mmHg (06/12 1501) SpO2:  [96 %-100 %] 98 % (06/12 1501) FiO2 (%):  [28 %] 28 % (06/12 1501) Weight:  [255 lb 8.2 oz (115.9 kg)] 255 lb 8.2 oz (115.9 kg) (06/12 0412)  CBC:   Recent Labs Lab 07/22/15 0405 07/23/15 0351  WBC 8.6 7.5  HGB 10.3* 10.3*  HCT 35.1* 34.0*  MCV 73.3* 72.6*  PLT 361 353    Basic Metabolic Panel:   Recent Labs Lab 07/21/15 0325 07/22/15 0405 07/23/15 0351 07/23/15 0713  NA 142 139 137 135  K 3.3* 3.1* 3.4* 3.2*  CL 107 105 105 103  CO2 22 24 24 23   GLUCOSE 104* 114* 109* 116*  BUN 44* 31* 19 22*  CREATININE 1.17* 0.85 0.75 0.73  CALCIUM 8.7* 8.9 8.8* 8.8*  MG 2.1 2.1  --   --   PHOS  --  2.8  --   --     Lipid Panel:     Component Value Date/Time   CHOL 187 07/13/2015 1057   TRIG 525* 07/22/2015 1005   HDL 60 07/13/2015 1057   CHOLHDL 3.1 07/13/2015 1057   VLDL 20 07/13/2015 1057   LDLCALC 107* 07/13/2015 1057   HgbA1c:  Lab Results  Component Value Date   HGBA1C 5.5 07/13/2015   Urine Drug Screen:     Component Value Date/Time   LABOPIA NONE DETECTED 07/16/2015 1414   COCAINSCRNUR NONE DETECTED 07/16/2015 1414   LABBENZ NONE DETECTED 07/16/2015 1414   AMPHETMU NONE DETECTED 07/16/2015 1414   THCU NONE DETECTED 07/16/2015 1414   LABBARB NONE DETECTED 07/16/2015 1414      IMAGING Blue text is Dr. Phoebe Sharps interpretation  Ct Angio Head and neck W/cm &/or Wo Cm 07/13/2015  IMPRESSION: Stable acute small volume parenchymal hemorrhages as  described in the LEFT pons and LEFT frontal operculum. No underlying extracranial or intracranial stenosis, vascular malformation or aneurysm. No evidence for venous sinus thrombosis. Extensive hypoattenuation of white matter, likely severe chronic microvascular ischemic change related to hypertension. 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  Ct Head Wo Contrast 07/14/2015  IMPRESSION: 1. Overall no significant interval change in left frontal opercular hemorrhage with mild localized edema. 2. Stable 6 mm left pontine hemorrhage without significant edema. 3. No other new acute intracranial process.  07/12/2015  IMPRESSION: 1. Acute hemorrhage at the left parietal lobe, measuring 1.7 x 1.3 cm. 2. Small focus of acute hemorrhage at the left side of the pons, measuring 0.6 cm. 3. Scattered small vessel ischemic microangiopathy and mild chronic ischemic change at the basal ganglia bilaterally.  TTE - Left ventricle: The cavity size was normal. Wall thickness was normal. Systolic function was normal. The estimated ejection fraction was in the range of 60% to 65%. Wall motion was normal; there were no regional wall motion abnormalities. Doppler parameters are consistent with abnormal left ventricular relaxation (grade 1 diastolic dysfunction). - Aortic valve: There was mild to moderate stenosis. Valve area (VTI): 1.85 cm^2.  Valve area (Vmax): 1.42 cm^2. Valve area (Vmean): 1.39 cm^2. - Mitral valve: Severely calcified annulus. The findings are consistent with mild stenosis. - Left atrium: The atrium was mildly dilated.  MRI brain Posterior left frontal opercular 2 cm hematoma. Left upper pons 9 mm hematoma. These hemorrhage appear acute/ subacute. Mild vasogenic edema surrounds left frontal lobe hematoma. Scattered small chronic blood breakdown products posterior limb right internal capsule, right external capsule, posterior right  opercular  region, anterior right frontal lobe, left temporal lobe, right occipital lobe and cerebellum bilaterally. Intracranial hemorrhage most likely related to hypertension given the present clinical setting and surrounding findings. Recommend follow-up until complete clearance. Small acute/subacute nonhemorrhagic infarcts frontal and parietal lobes bilaterally. Remote bilateral thalamic, bilateral centrum semiovale/ corona radiata, bilateral basal ganglia and right pontine infarcts. Marked confluent white matter changes most consistent with result of chronic microvascular disease. Linear dural enhancement of indeterminate etiology described in several benign settings. Opacification petrous apex and partial opacification left mastoid air cells without obstructing lesion of the eustachian tube noted. No definitive findings to suggest petrous a bursitis. If the patient developed cranial nerve symptoms then this possibly would need to be considered.  Autoimmune work up - elevated ESR, CRP and RF, others negative.  Renal A duplex - Right kidney is asymmetrically smaller than left and has hyperechoic appearance. Minimal arterial flow obtained in right renal artery and intrarenal flow, which is noted to be atypical. No apparent renal artery stenosis on the left.  ROS:  Only complaint is mid-epigatric pain  PHYSICAL EXAM General:    Slight discomfort; Awake and alert.  S/p trach  Obese Cardiovascular:  RRR Pulmonary: CTA Abdomen: obese, soft; diffuse mild tenderness Extremities: No C/C/E  Neurological Exam Mental Status:  Awake, alert follows simple commands.  Appears depressed Orientation:  Nods and shakes head appropriately.  Knows self and place Speech:  trach Cranial Nerves:  Pupils equal both reactive. Fundi were not visualized. B/l incomplete abduction. No nystagmus.  Smile symmetric; unable to produce cough  Motor Exam:  Tone:  Increased throughout Power:  LUE and LLE 3+/5, RUE 3+/5 and RLE and  LLE 2/5.  Sensory: Reports symmetric to LT throughout  Coordination:  defered  Gait:defered  ASSESSMENT/PLAN [name] is a 50 y.o. female with history of HTN and noncompliance presenting with speech difficulty and R sided weakness following a fall. BP elevated. CT showed a L parietal and small pontine hemorrhage could be due to malignant hypertension, however, CNS vasculitis is in DDx.   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 However, location of ICH is really atypical. 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  MRI multifocal microbleeds and microinfarcts, concerning for HTN vs. Vasculitis  Consider LP or cerebral angio once pt more stabilized.   2D Echo  EF 60-65%   SCDs and lovenox for VTE prophylaxis  No antithrombotic prior to admission  Ongoing aggressive stroke risk factor management  Therapy recommendations:  CIR. They are following at a distance until ready for higher level of activity  Disposition:  pending   Hypertensive Emergency  BP 280/150 on arrival to hospital in setting of neuro symptoms  On cardene, but d/c due to large volume  Put on cleviprex, at max dose today  Put on amlodipine 10 qd, HCTZ 25 qd, lisinopril 20 bid, metoprolol 75 bid and hydralazine 50 q8  Increase metoprolol to 100 bid  Increase SBP goal to < 180  Renal artery duplex - small R kidney w/ right renal artery lack of flow   Renal consulted and feels HTN not related to renal disease but directly associated with ICH and long-standing HTN  Malignant HTN work up - pending  Pneumonia / respiratory failure  Intubated and on vent  CCM on board  Febrile   Blood  culture no growth thus far  On zosyn and vanco  Leukocytosis 11.3->14.7->16.7-> 12.7->10.4 (improved)  Copious scecretions  Not able to extubate at this time  CCM placing trach this am   Other Stroke Risk Factors  Morbid Obesity, Body mass index is 41.26 kg/(m^2).   Other Active Problems   Hypokalemia - supplemented  Hypernatremia and azotemia - free water continued and IV fluids added  Hospital day # 11   CRITICAL CARE NEUROLOGY ATTENDING NOTE Patient was seen and examined by me personally. I independently viewed imaging studies, participated in medical decision making and plan of care. The laboratory and radiographic studies were personally reviewed by me.  ROS completed by me personally and pertinent positives fully documented.  Assessment and plan completed by me personally and fully documented above.  Condition is unchanged    This patient is critically ill and at significant risk of neurological worsening, death and care requires constant monitoring of vital signs, hemodynamics,respiratory and cardiac monitoring, extensive review of multiple databases, frequent neurological assessment, discussion with family, other specialists and medical decision making of high complexity.  This critical care time does not reflect procedure time, or teaching time or supervisory time of PA/NP/Med Resident etc. but could involve care discussion time.  I spent 32 minutes of Neurocritical Care time in the care of  this patient.  Orders/Plans:  Continued Buspar to address depression    Continue to replete potassium for hypokalemia  Mobilize out of bed  ST to eval swallow  Dr Nelda Marseille to downsize trach   SIGNED BY: Dr.Casie Sturgeon Leonie Man, MD    To contact Stroke Continuity provider, please refer to [email or web address] After hours, contact General Neurology

## 2015-07-23 NOTE — Progress Notes (Signed)
Physical Therapy Treatment Patient Details Name: Jerre SimonSherry Chuang MRN: [id number] DOB: Aug 25, 1965 Today's Date: 07/23/2015    History of Present Illness 50 yo female with Lt parietal and Lt pontine hemorrhage in setting of HTN urgency. Now with snoring respirations, poor gag reflex, and intermittent hypoxia. 6/2 intubated for airway protection    PT Comments    Patient seen for therapy this morning. Tolerated session well. Patient with improvements noted in activity tolerance and EOB sitting balance. Session focused on trunk control, and LE strengthening EOB. Patient very eager and happy with today's progression. Patient did wear PMSV and was able to verbalize with cues. Will continue to see and progress as tolerated. OF NOTE: Could not go OOB to chair as MD coming to change trach out.   Follow Up Recommendations  CIR;Supervision/Assistance - 24 hour     Equipment Recommendations  Other (comment) (TBD)    Recommendations for Other Services Rehab consult     Precautions / Restrictions Precautions Precautions: Fall Restrictions Weight Bearing Restrictions: No    Mobility  Bed Mobility Overal bed mobility: Needs Assistance;+2 for physical assistance Bed Mobility: Rolling;Supine to Sit;Sit to Supine Rolling: Total assist;+2 for physical assistance   Supine to sit: Max assist;+2 for physical assistance;+2 for safety/equipment Sit to supine: Total assist;+2 for physical assistance;+2 for safety/equipment      Transfers                 General transfer comment: not assessed today  Ambulation/Gait                 Stairs            Wheelchair Mobility    Modified Rankin (Stroke Patients Only) Modified Rankin (Stroke Patients Only) Pre-Morbid Rankin Score: No symptoms Modified Rankin: Severe disability     Balance   Sitting-balance support: Feet supported Sitting balance-Leahy Scale: Poor Sitting balance - Comments: patient able to tolerate  static sitting for periods of ~20-30 seconds without assist                            Cognition Arousal/Alertness: Awake/alert   Overall Cognitive Status: No family/caregiver present to determine baseline cognitive functioning Area of Impairment: Attention;Following commands   Current Attention Level: Selective   Following Commands: Follows one step commands consistently            Exercises Other Exercises Other Exercises: faciliation on trunk control with R lateral leans on elbow then up to midline Other Exercises: trunk flexion/extension activities Other Exercises: Long arc quads sitting EOB 9limited ROM) Other Exercises: Ankle ROM with ankle pumps    General Comments        Pertinent Vitals/Pain Pain Assessment: No/denies pain    Home Living                      Prior Function            PT Goals (current goals can now be found in the care plan section) Acute Rehab PT Goals Patient Stated Goal: none stated PT Goal Formulation: Patient unable to participate in goal setting Time For Goal Achievement: 07/30/15 Potential to Achieve Goals: Fair Progress towards PT goals: Progressing toward goals    Frequency  Min 3X/week    PT Plan Current plan remains appropriate    Co-evaluation             End of Session Equipment Utilized During Treatment: Oxygen (  ATC) Activity Tolerance: Patient limited by fatigue Patient left: in bed;with call bell/phone within reach     Time: 1036-1102 PT Time Calculation (min) (ACUTE ONLY): 26 min  Charges:  $Therapeutic Activity: 23-37 mins                    G CodesFabio Asa Aug 15, 2015, 12:25 PM Charlotte Crumb, PT DPT  [phone]

## 2015-07-23 NOTE — Progress Notes (Signed)
PULMONARY / CRITICAL CARE MEDICINE   Name: Gwendolyn Morales MRN: [id number] DOB: [birth year]    ADMISSION DATE:  07/12/2015 CONSULTATION DATE:  07/13/2015  REFERRING MD:  Dr. [name]  CHIEF COMPLAINT:  Fall  HISTORY OF PRESENT ILLNESS:   50 yo female presented to ER after falling.  She had difficulty with speech and Rt sided weakness.  She had BP 280/150.  CT head showed 1.7 x 1.3 cm Lt parietal hemorrhage and 0.6 cm lateral pons hemorrhage.  NIH stroke score 10.  She was started on nicardipine for BP control, and admitted by stroke service.  She was noted to having snoring respiratory pattern, poor gag reflex, and intermittent hypoxia. Required intubation.  SUBJECTIVE:  No events overnight, tolerated TC well overnight.  VITAL SIGNS: BP 119/107 mmHg  Pulse 93  Temp(Src) 99.9 F (37.7 C) (Axillary)  Resp 24  Ht  (1.676 m)  Wt 115.9 kg (255 lb 8.2 oz)  BMI 41.26 kg/m2  SpO2 96%  HEMODYNAMICS:    VENTILATOR SETTINGS: Vent Mode:  [-]  FiO2 (%):  [28 %] 28 %  INTAKE / OUTPUT: I/O last 3 completed shifts: In: 7460 [I.V.:2700; NG/GT:4560; IV Piggyback:200] Out: 3685 [Urine:3260; Stool:425]  PHYSICAL EXAMINATION: General:  Awake, comfortable Neuro:  Follows commands, tracks and answers questions. HEENT: No JVD, ETT-> vent Cardiovascular:  s1 s2 RRT no r Lungs:  Ronchi, decreased in bases Abdomen:  Soft, obese, NT,  Musculoskeletal:  No sig edema Skin:  No rash, jaundiced, mildly.  LABS:  BMET  Recent Labs Lab 07/22/15 0405 07/23/15 0351 07/23/15 0713  NA 139 137 135  K 3.1* 3.4* 3.2*  CL 105 105 103  CO2 BUN 31* 19 22*  CREATININE 0.85 0.75 0.73  GLUCOSE 114* 109* 116*   Electrolytes  Recent Labs Lab 07/21/15 0325 07/22/15 0405 07/23/15 0351 07/23/15 0713  CALCIUM 8.7* 8.9 8.8* 8.8*  MG 2.1 2.1  --   --   PHOS  --  2.8  --   --    CBC  Recent Labs Lab 07/21/15 0325 07/22/15 0405 07/23/15 0351  WBC 10.4 8.6 7.5  HGB 11.4* 10.3*  10.3*  HCT 38.4 35.1* 34.0*  PLT 339 361 330   Coag's  Recent Labs Lab 07/22/15 0405  INR 1.14   Sepsis Markers No results for input(s): LATICACIDVEN, PROCALCITON, O2SATVEN in the last 168 hours.  ABG No results for input(s): PHART, PCO2ART, PO2ART in the last 168 hours.  Liver Enzymes  Recent Labs Lab 07/21/15 1016 07/22/15 0405  AST 178* 174*  ALT 194* 186*  ALKPHOS 240* 260*  BILITOT 4.6* 4.1*  ALBUMIN 2.6* 2.3*   Cardiac Enzymes No results for input(s): TROPONINI, PROBNP in the last 168 hours.  Glucose  Recent Labs Lab 07/21/15 0727 07/21/15 1149 07/21/15 1614 07/21/15 1924 07/22/15 0349 07/22/15 0716  GLUCAP 102* 112* 104* 109* 111* 104*   Imaging Dg Chest Port 1 View  07/23/2015  CLINICAL DATA:  Productive cough. EXAM: PORTABLE CHEST 1 VIEW COMPARISON:  July 21, 2015. FINDINGS: Stable cardiomediastinal silhouette. Tracheostomy tube in grossly good position. No pneumothorax or pleural effusion is noted. Minimal bibasilar subsegmental atelectasis is noted. Both lungs are clear. The visualized skeletal structures are unremarkable. IMPRESSION: Minimal bibasilar subsegmental atelectasis. Electronically Signed   By: Lupita Raider, M.D.   On: 07/23/2015 07:45   STUDIES:  6/01 CT head >> 1.7 x 1.3 cm Lt parietal ICH, 0.6 cm Lt pons ICH, scattered small vessel ischemia  6/03 Echo >> LVEF 65%, grade 1 diastolic dysfxn MRI Brain 6/6 >> L frontal occipital hematoma 2cm, L upper pons 9mm, mild vasogenic edema.   CULTURES: Blood 6/5 >>  Blood 6/7 >>  resp 6/8 >> moderate GPC >>   ANTIBIOTICS: Pip/tazo 6/7 >> 6/13 vanco 6/7 >> 6/10  SIGNIFICANT EVENTS: 6/01 Admit 6/2- HTN, poor airway control, intubated 6/4 very awake  LINES/TUBES: 6/2 ett>>6/9 Trach Ninetta Lights(JY) 6/9>>>  I reviewed CXR myself, ETT in good position.  DISCUSSION: 50 yo female with Lt parietal and Lt pontine hemorrhage in setting of HTN urgency.  Now with snoring respirations, poor gag reflex,  and intermittent hypoxia. 6/2 intubated for airway protection. Concern for UA swelling and irritation. MS improved. LLL HCAP. Plan for trach on 6/9  ASSESSMENT / PLAN:  NEUROLOGIC A:   Acute Lt parietal and pontine hemorrhage P:   RASS goal: 0 to -1 Neurochecks per neuro. Imaging per neuro > CT, MRI brain as above. BP control as ordered.  PULMONARY A: Acute hypoxic respiratory failure Upper airway, poor control secretions LLL PNA P:   F/u CXR Secretion improved, change to cuffless 6 trach. Apply PMV per speech. SLP.  CARDIOVASCULAR A:  HTN urgency. P:  Clevidipine to keep SBP < 160, off now Amlodipine 10, hydralazine scheduled, lisinopril, metop 75 bid,  Echo ef 65% g1df Addition clonidine if remains hypertensive  RENAL  Recent Labs Lab 07/22/15 0405 07/23/15 0351 07/23/15 0713  K 3.1* 3.4* 3.2*   A:   Hypokalemia. P:   Replace electrolytes as needed Continue scheduled diuresis, goal net negative Follow BMP  GASTROINTESTINAL A:   Dysphagia prior to intubation  Hyperbili and LFTs P:   Monitor LFT and bilirubin. D/C all offending agents Check coags. Panda and feeds SLP pending Hold off PEG for now.  HEMATOLOGIC A:   DVT prevention P:  SCDs for DVT prophylaxis Follow CBC  INFECTIOUS A:   New LLL infiltrate, possible VAP / HCAP P:   D/C vanc. Continue zosyn last dose 6/13. F/U on culture noted.  ENDOCRINE CBG (last 3)   Recent Labs  07/21/15 1924 07/22/15 0349 07/22/15 0716  GLUCAP 109* 111* 104*   A:   Mild hyperglycemia.  P:    Monitor blood sugar on BMET  FAMILY  - Updates: updated patient bedside 6/12.  - Inter-disciplinary family meet or Palliative Care meeting due by: 6/9  Discussed with neurology.  Change trach to 6 cuffless then transfer to SDU, PCCM will continue to follow for trach management.  Alyson ReedyWesam G. Deyjah Kindel, M.D. [facility]. Pager: [phone]. After hours pager: [phone].

## 2015-07-24 ENCOUNTER — Inpatient Hospital Stay (HOSPITAL_COMMUNITY)
Admission: RE | Admit: 2015-07-24 | Discharge: 2015-08-17 | DRG: 056 | Disposition: A | Discharge status: Home Health | Payer: Self-pay | Source: Intra-hospital | Attending: Physical Medicine & Rehabilitation | Admitting: Physical Medicine & Rehabilitation

## 2015-07-24 DIAGNOSIS — I10 Essential (primary) hypertension: Secondary | ICD-10-CM | POA: Diagnosis present

## 2015-07-24 DIAGNOSIS — I619 Nontraumatic intracerebral hemorrhage, unspecified: Secondary | ICD-10-CM | POA: Diagnosis present

## 2015-07-24 DIAGNOSIS — I610 Nontraumatic intracerebral hemorrhage in hemisphere, subcortical: Secondary | ICD-10-CM

## 2015-07-24 DIAGNOSIS — J690 Pneumonitis due to inhalation of food and vomit: Secondary | ICD-10-CM | POA: Diagnosis present

## 2015-07-24 DIAGNOSIS — N390 Urinary tract infection, site not specified: Secondary | ICD-10-CM | POA: Diagnosis present

## 2015-07-24 DIAGNOSIS — R1319 Other dysphagia: Secondary | ICD-10-CM | POA: Diagnosis present

## 2015-07-24 DIAGNOSIS — Z6841 Body Mass Index (BMI) 40.0 and over, adult: Secondary | ICD-10-CM

## 2015-07-24 DIAGNOSIS — I5189 Other ill-defined heart diseases: Secondary | ICD-10-CM | POA: Diagnosis present

## 2015-07-24 DIAGNOSIS — Z9119 Patient's noncompliance with other medical treatment and regimen: Secondary | ICD-10-CM

## 2015-07-24 DIAGNOSIS — E876 Hypokalemia: Secondary | ICD-10-CM | POA: Diagnosis present

## 2015-07-24 DIAGNOSIS — Z930 Tracheostomy status: Secondary | ICD-10-CM

## 2015-07-24 DIAGNOSIS — R32 Unspecified urinary incontinence: Secondary | ICD-10-CM | POA: Diagnosis present

## 2015-07-24 DIAGNOSIS — E8809 Other disorders of plasma-protein metabolism, not elsewhere classified: Secondary | ICD-10-CM | POA: Diagnosis present

## 2015-07-24 DIAGNOSIS — I69293 Ataxia following other nontraumatic intracranial hemorrhage: Secondary | ICD-10-CM

## 2015-07-24 DIAGNOSIS — K7200 Acute and subacute hepatic failure without coma: Secondary | ICD-10-CM | POA: Diagnosis present

## 2015-07-24 DIAGNOSIS — I69191 Dysphagia following nontraumatic intracerebral hemorrhage: Secondary | ICD-10-CM

## 2015-07-24 DIAGNOSIS — I69391 Dysphagia following cerebral infarction: Secondary | ICD-10-CM

## 2015-07-24 DIAGNOSIS — I519 Heart disease, unspecified: Secondary | ICD-10-CM

## 2015-07-24 DIAGNOSIS — E669 Obesity, unspecified: Secondary | ICD-10-CM | POA: Diagnosis present

## 2015-07-24 DIAGNOSIS — F4321 Adjustment disorder with depressed mood: Secondary | ICD-10-CM | POA: Diagnosis present

## 2015-07-24 DIAGNOSIS — N269 Renal sclerosis, unspecified: Secondary | ICD-10-CM | POA: Diagnosis present

## 2015-07-24 DIAGNOSIS — I69151 Hemiplegia and hemiparesis following nontraumatic intracerebral hemorrhage affecting right dominant side: Principal | ICD-10-CM

## 2015-07-24 DIAGNOSIS — T3695XA Adverse effect of unspecified systemic antibiotic, initial encounter: Secondary | ICD-10-CM | POA: Diagnosis present

## 2015-07-24 DIAGNOSIS — B961 Klebsiella pneumoniae [K. pneumoniae] as the cause of diseases classified elsewhere: Secondary | ICD-10-CM | POA: Diagnosis present

## 2015-07-24 DIAGNOSIS — F419 Anxiety disorder, unspecified: Secondary | ICD-10-CM | POA: Diagnosis present

## 2015-07-24 DIAGNOSIS — D62 Acute posthemorrhagic anemia: Secondary | ICD-10-CM | POA: Diagnosis present

## 2015-07-24 DIAGNOSIS — N179 Acute kidney failure, unspecified: Secondary | ICD-10-CM | POA: Diagnosis present

## 2015-07-24 DIAGNOSIS — R197 Diarrhea, unspecified: Secondary | ICD-10-CM | POA: Diagnosis present

## 2015-07-24 LAB — MAGNESIUM: Magnesium: 1.8 mg/dL (ref 1.7–2.4)

## 2015-07-24 LAB — METANEPHRINES, PLASMA
METANEPHRINE FREE: 22 pg/mL (ref 0–62)
Normetanephrine, Free: 95 pg/mL (ref 0–145)

## 2015-07-24 LAB — BASIC METABOLIC PANEL
Anion gap: 7 (ref 5–15)
BUN: 19 mg/dL (ref 6–20)
CALCIUM: 8.8 mg/dL — AB (ref 8.9–10.3)
CO2: 23 mmol/L (ref 22–32)
CREATININE: 0.73 mg/dL (ref 0.44–1.00)
Chloride: 105 mmol/L (ref 101–111)
GFR calc Af Amer: >60 mL/min (ref >60)
GLUCOSE: 96 mg/dL (ref 65–99)
POTASSIUM: 3.8 mmol/L (ref 3.5–5.1)
Sodium: 135 mmol/L (ref 135–145)

## 2015-07-24 LAB — CBC
HCT: 31.1 % — ABNORMAL LOW (ref 36.0–46.0)
Hemoglobin: 9.4 g/dL — ABNORMAL LOW (ref 12.0–15.0)
MCH: 21.3 pg — AB (ref 26.0–34.0)
MCHC: 30.2 g/dL (ref 30.0–36.0)
MCV: 70.4 fL — AB (ref 78.0–100.0)
PLATELETS: 387 10*3/uL (ref 150–400)
RBC: 4.42 MIL/uL (ref 3.87–5.11)
RDW: 19.3 % — AB (ref 11.5–15.5)
WBC: 8.7 10*3/uL (ref 4.0–10.5)

## 2015-07-24 LAB — METANEPHRINES, URINE, 24 HOUR
METANEPH TOTAL UR: 120 ug/L
METANEPHRINES 24H UR: 288 ug/(24.h) (ref 45–290)
Normetanephrine, 24H Ur: 1020 ug/24 hr — ABNORMAL HIGH (ref 82–500)
Normetanephrine, Ur: 425 ug/L
Total Volume: 2400

## 2015-07-24 LAB — PHOSPHORUS: Phosphorus: 3.2 mg/dL (ref 2.5–4.6)

## 2015-07-24 MED ORDER — LISINOPRIL 20 MG PO TABS
20.0000 mg | ORAL_TABLET | Freq: Two times a day (BID) | ORAL | Status: DC
  Administered 2015-07-24 – 2015-07-31 (×14): 20 mg via ORAL
  Filled 2015-07-24 (×14): qty 1

## 2015-07-24 MED ORDER — BUSPIRONE HCL 15 MG PO TABS
7.5000 mg | ORAL_TABLET | Freq: Two times a day (BID) | ORAL | Status: DC
  Administered 2015-07-24 – 2015-08-17 (×48): 7.5 mg via ORAL
  Filled 2015-07-24 (×2): qty 2
  Filled 2015-07-24 (×4): qty 1
  Filled 2015-07-24: qty 2
  Filled 2015-07-24 (×3): qty 1
  Filled 2015-07-24: qty 2
  Filled 2015-07-24 (×8): qty 1
  Filled 2015-07-24: qty 2
  Filled 2015-07-24 (×8): qty 1
  Filled 2015-07-24 (×4): qty 2
  Filled 2015-07-24 (×2): qty 1
  Filled 2015-07-24: qty 2
  Filled 2015-07-24 (×2): qty 1
  Filled 2015-07-24: qty 2
  Filled 2015-07-24 (×2): qty 1
  Filled 2015-07-24: qty 2
  Filled 2015-07-24 (×2): qty 1
  Filled 2015-07-24: qty 2
  Filled 2015-07-24 (×3): qty 1
  Filled 2015-07-24: qty 2
  Filled 2015-07-24 (×2): qty 1
  Filled 2015-07-24: qty 2

## 2015-07-24 MED ORDER — PROCHLORPERAZINE EDISYLATE 5 MG/ML IJ SOLN: 5.0000 mg | Freq: Four times a day (QID) | INTRAMUSCULAR | Status: DC | PRN

## 2015-07-24 MED ORDER — POTASSIUM CHLORIDE 20 MEQ PO PACK
20.0000 meq | PACK | Freq: Three times a day (TID) | ORAL | Status: DC
Start: 2015-07-24 — End: 2015-08-01
  Administered 2015-07-24 – 2015-08-01 (×23): 20 meq via ORAL
  Filled 2015-07-24 (×23): qty 1

## 2015-07-24 MED ORDER — DIPHENHYDRAMINE HCL 12.5 MG/5ML PO ELIX: 12.5000 mg | ORAL_SOLUTION | Freq: Four times a day (QID) | ORAL | Status: DC | PRN

## 2015-07-24 MED ORDER — RESOURCE THICKENUP CLEAR PO POWD
ORAL | Status: DC | PRN
  Filled 2015-07-24 (×2): qty 125

## 2015-07-24 MED ORDER — PROCHLORPERAZINE 25 MG RE SUPP: 12.5000 mg | Freq: Four times a day (QID) | RECTAL | Status: DC | PRN

## 2015-07-24 MED ORDER — PROCHLORPERAZINE MALEATE 5 MG PO TABS: 5.0000 mg | ORAL_TABLET | Freq: Four times a day (QID) | ORAL | Status: DC | PRN

## 2015-07-24 MED ORDER — PIPERACILLIN-TAZOBACTAM 3.375 G IVPB
3.3750 g | Freq: Three times a day (TID) | INTRAVENOUS | Status: AC
  Administered 2015-07-24: 3.375 g via INTRAVENOUS
  Filled 2015-07-24: qty 50

## 2015-07-24 MED ORDER — HYDRALAZINE HCL 50 MG PO TABS
50.0000 mg | ORAL_TABLET | Freq: Three times a day (TID) | ORAL | Status: DC
  Administered 2015-07-24 – 2015-08-17 (×72): 50 mg via ORAL
  Filled 2015-07-24 (×72): qty 1

## 2015-07-24 MED ORDER — SENNOSIDES-DOCUSATE SODIUM 8.6-50 MG PO TABS
1.0000 | ORAL_TABLET | Freq: Every evening | ORAL | Status: DC | PRN
  Administered 2015-08-01: 1 via ORAL
  Filled 2015-07-24: qty 1

## 2015-07-24 MED ORDER — PANTOPRAZOLE SODIUM 40 MG PO TBEC
40.0000 mg | DELAYED_RELEASE_TABLET | Freq: Every day | ORAL | Status: DC
  Administered 2015-07-24: 40 mg via ORAL
  Filled 2015-07-24: qty 1

## 2015-07-24 MED ORDER — RESOURCE THICKENUP CLEAR PO POWD
ORAL | Status: DC | PRN
  Filled 2015-07-24: qty 125

## 2015-07-24 MED ORDER — BISACODYL 10 MG RE SUPP: 10.0000 mg | Freq: Every day | RECTAL | Status: DC | PRN

## 2015-07-24 MED ORDER — CHLORHEXIDINE GLUCONATE 0.12 % MT SOLN
15.0000 mL | Freq: Two times a day (BID) | OROMUCOSAL | Status: DC
  Administered 2015-07-24 – 2015-08-17 (×45): 15 mL via OROMUCOSAL
  Filled 2015-07-24 (×44): qty 15

## 2015-07-24 MED ORDER — CETYLPYRIDINIUM CHLORIDE 0.05 % MT LIQD
7.0000 mL | Freq: Two times a day (BID) | OROMUCOSAL | Status: DC
  Administered 2015-07-25 – 2015-08-16 (×25): 7 mL via OROMUCOSAL

## 2015-07-24 MED ORDER — ALUM & MAG HYDROXIDE-SIMETH 200-200-20 MG/5ML PO SUSP: 30.0000 mL | ORAL | Status: DC | PRN

## 2015-07-24 MED ORDER — TRAZODONE HCL 50 MG PO TABS: 25.0000 mg | ORAL_TABLET | Freq: Every evening | ORAL | Status: DC | PRN

## 2015-07-24 MED ORDER — TRAMADOL HCL 50 MG PO TABS
25.0000 mg | ORAL_TABLET | Freq: Four times a day (QID) | ORAL | Status: DC | PRN
  Administered 2015-07-25: 25 mg via ORAL
  Administered 2015-08-09 – 2015-08-15 (×2): 50 mg via ORAL
  Filled 2015-07-24 (×3): qty 1

## 2015-07-24 MED ORDER — HYDROCHLOROTHIAZIDE 10 MG/ML ORAL SUSPENSION
25.0000 mg | Freq: Every day | ORAL | Status: DC
  Filled 2015-07-24: qty 2.5

## 2015-07-24 MED ORDER — ENOXAPARIN SODIUM 40 MG/0.4ML ~~LOC~~ SOLN
40.0000 mg | SUBCUTANEOUS | Status: DC
  Administered 2015-07-24 – 2015-08-01 (×9): 40 mg via SUBCUTANEOUS
  Filled 2015-07-24 (×10): qty 0.4

## 2015-07-24 MED ORDER — IPRATROPIUM-ALBUTEROL 0.5-2.5 (3) MG/3ML IN SOLN: 3.0000 mL | RESPIRATORY_TRACT | Status: DC | PRN

## 2015-07-24 MED ORDER — AMLODIPINE BESYLATE 10 MG PO TABS
10.0000 mg | ORAL_TABLET | Freq: Every day | ORAL | Status: DC
  Administered 2015-07-25 – 2015-08-17 (×24): 10 mg
  Filled 2015-07-24 (×25): qty 1

## 2015-07-24 MED ORDER — METOPROLOL TARTRATE 25 MG/10 ML ORAL SUSPENSION
100.0000 mg | Freq: Two times a day (BID) | ORAL | Status: DC
  Administered 2015-07-24: 100 mg
  Filled 2015-07-24: qty 40

## 2015-07-24 MED ORDER — GUAIFENESIN-DM 100-10 MG/5ML PO SYRP: 5.0000 mL | ORAL_SOLUTION | Freq: Four times a day (QID) | ORAL | Status: DC | PRN

## 2015-07-24 MED ORDER — FLEET ENEMA 7-19 GM/118ML RE ENEM: 1.0000 | ENEMA | Freq: Once | RECTAL | Status: DC | PRN

## 2015-07-24 NOTE — Progress Notes (Addendum)
Speech Language Pathology Treatment: [name] Speaking valve;Cognitive-Linquistic  Patient Details Name: Gwendolyn Morales MRN: [id number] DOB: 07/31/1965 Today's Date: 07/24/2015 Time: [id number] SLP Time Calculation (min) (ACUTE ONLY): 20 min  Assessment / Plan / Recommendation Clinical Impression  Trach changed to cuffless yesterday. She tolerated valve for extended period of time with increased vocal intensity and words per breath. Initially required mod-max reminders for deep inhale prior to speaking fading to modified independence. Valve falling from trach hub intermittently however no overt back pressure/air trapping noted. Vitals within normal range majority of session with RR briefly increasing to 38. Recommend pt wear valve during waking hours with intermittent supervision, with all meals/pills.    HPI HPI: 50 y.o. female with a history of hypertension and noncompliance with treatment brought to the ED and code stroke status following acute onset of a fall, speech difficulty and right-sided weakness. CT head showed a 1.7 x 1.3 cm acute left parietal hemorrhage and a 0.6 cm hemorrhage involving the left lateral pons. No CXR.       SLP Plan  Continue with current plan of care     Recommendations         Patient may use Passy-Muir Speech Valve: During all waking hours (remove during sleep) PMSV Supervision: Intermittent      Oral Care Recommendations: Oral care BID Follow up Recommendations: Inpatient Rehab Plan: Continue with current plan of care     GO                Royce MacadamiaLitaker, Eyob Godlewski Willis 07/24/2015, 10:11 AM   Breck CoonsLisa Willis Lonell FaceLitaker M.Ed [employer] Pager [phone]

## 2015-07-24 NOTE — Progress Notes (Signed)
I met with pt at bedside. I discussed an inpt rehab admission prior to d/c home. I have called and left a message for pt's spouse to contact me to discuss a possible inpt rehab admission. I await his call back to discuss and determine rehab venue options. [phone]

## 2015-07-24 NOTE — H&P (View-Only) (Signed)
Physical Medicine and Rehabilitation Admission H&P    CC:  Right sided weakness, right facial weakness, dysphagia with aspiration PNA and trach   HPI: Gwendolyn Morales is a 50 y.o. female with history of morbid obesity, HTN and medical non-compliance who was admitted on 07/12/15 a fall at home with subsequent right sided weakness and speech difficulty. History taken from chart review. BP at admission markedly elevated at 280/150 and she was started on cardene drip for control. CT head done showing acute hemorrhage 1.7 cm X 1.3 cm at left parietal lobe, small focus of acute hemorrhage left pons and scattered small vessel microangiopathy. She was noted to have intermittent hypoxia with sonorous respirations and was intubated for airway protection. CTA head/neck showed stable hemorrhage, no stenosis, AVM or aneurysm, and advanced dolichoectasia. 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 Dr. 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 To consider LP or cerebral angio if indicated.   Patient continues to have copious oral secretions with UE edema. She was started on IV Vanc/Zosyn for treatment of aspiration PNA. Blood pressure difficulty to control and renal ultrasound done revealing minimal flow in right RA with smaller right kidney. 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 Blood pressures improving therefore they signed off 06/09. 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 She was weaned to ATC on 06/10 and vasculitis panel with elevated ESR, CRP and RF otherwise negative. She continues to have issues with  anxiety requiring propofol and was transitioned to busparl. She has had diarrhea, epigastric pain with jaundice and was found to have elevated bilirubin of 4.1. Stool on 06/10 was negative fo C- diff and rectal tube placed to help with management.   PCCM recommends avoid nephrotoxic medications and to continue zosyn thorough 06/13.  PMSV trials initiated yesterday and patient downsized to cuff less # 6 trach.  She was started on dysphagia 2, nectar liquids today due to facial weakness and weak cough. Therapy has been working with patient and she is able to tolerate sitting at EOB as well as phonate with PMSV.  CIR recommend for follow up therapy.    Review of Systems  HENT: Negative for hearing loss.   Eyes: Negative for blurred vision and double vision.  Respiratory: Negative for cough and shortness of breath.   Cardiovascular: Negative for chest pain and palpitations.  Gastrointestinal: Positive for diarrhea. Negative for heartburn, nausea and abdominal pain.  Genitourinary:       Incontinent of bladder at this time  Musculoskeletal: Negative for myalgias.  Neurological: Positive for sensory change, speech change, focal weakness and weakness. Negative for headaches.  Psychiatric/Behavioral: The patient is not nervous/anxious.       Past Medical History  Diagnosis Date  . Hypertension     History reviewed. No pertinent past surgical history.    History reviewed. No pertinent family history.    Social History:  reports that she has never smoked. She does not have any smokeless tobacco history on file. She reports that she does not drink alcohol or use illicit drugs.    Allergies: No Known Allergies    Medications Prior to Admission  Medication Sig Dispense  Refill  . diphenhydrAMINE (BENADRYL) 25 MG tablet Take 25 mg by mouth every 6 (six) hours as needed for allergies.    Marland Kitchen ibuprofen (ADVIL,MOTRIN) 400 MG tablet Take 400 mg by mouth every 6 (six) hours as needed for mild pain.     . Multiple Vitamin (MULTIVITAMIN WITH MINERALS) TABS tablet Take 1 tablet by mouth daily.      Home: Home Living Family/patient expects to be discharged to:: Inpatient rehab Living Arrangements: Spouse/significant other, Children Available Help at Discharge: Family Type of Home: House Home Access: Level entry Home Layout: One level Bathroom Shower/Tub: Producer, television/film/video: None  Lives With: Spouse   Functional History: Prior Function Level of Independence: Independent  Functional Status:  Mobility: Bed Mobility Overal bed mobility: Needs Assistance, +2 for physical assistance, + 2 for safety/equipment Bed Mobility: Rolling, Supine to Sit Rolling: +2 for physical assistance, Mod assist Supine to sit: +2 for physical assistance Sit to supine: Total assist, +2 for physical assistance, +2 for safety/equipment General bed mobility comments: Max assist via helicopter technique to come to EOB. Patient with some LE movement to EOB with multi-modal cues. Max assist of 2 persons for elevation of trunk to upright position with posterior support at EOB. Total assist to return to supine. Transfers General transfer comment: not assessed today Ambulation/Gait General Gait Details: not assessed today    ADL: ADL Overall ADL's : Needs assistance/impaired Eating/Feeding: NPO Grooming: Oral care, Minimal assistance Grooming Details (indicate cue type and reason): Hand over hand assist to complete task but pt able to initiate with washcloth in L hand. Upper Body Bathing: Minimal assitance Lower Body Bathing: Maximal assistance Lower Body Bathing Details (indicate cue type and reason): Total assist to clean pt after BM. General ADL Comments: Pt with heavy R lean in static sitting. pt requires (A) to progress to EOB static sitting.   Cognition: Cognition Overall Cognitive Status: No family/caregiver present to determine baseline cognitive functioning Arousal/Alertness:  Awake/alert Orientation Level: Oriented X4 Attention: Sustained Sustained Attention: Impaired Sustained Attention Impairment: Verbal basic, Functional basic Memory:  (appears fuctional informally) Awareness: Impaired Awareness Impairment: Emergent impairment, Anticipatory impairment Problem Solving:  (TBA further) Behaviors:  (borderline labile) Safety/Judgment: Appears intact Cognition Arousal/Alertness: Awake/alert Behavior During Therapy: WFL for tasks assessed/performed Overall Cognitive Status: No family/caregiver present to determine baseline cognitive functioning Area of Impairment: Attention, Following commands Current Attention Level: Selective Following Commands: Follows one step commands consistently General Comments: Pt talking with PMV on during session. pt exiting bed on L side supine to sit. Pt at EOB with hoyer lift placed and sky lifted to chair. Pt tolerated transfer well.    Difficult to assess due to: Impaired communication, Intubated   Blood pressure 126/115, pulse 76, temperature 98.4 F (36.9 C), temperature source Oral, resp. rate 36, height 5' 6"  (1.676 m), weight 116.1 kg (255 lb 15.3 oz), SpO2 96 %. Physical Exam  Nursing note and vitals reviewed. Constitutional: She appears well-developed and well-nourished.  Obese female. Panda in left nares. Up in chair eating lunch--NAD.   HENT:  Head: Normocephalic and atraumatic.  Eyes: Conjunctivae and EOM are normal. Pupils are equal, round, and reactive to light.  Dilated pupils  Neck:  Trach in place with ATC  Cardiovascular: Normal rate and regular rhythm.   Respiratory: Effort normal. No stridor. She has rhonchi. She exhibits no tenderness.  Puffing breaths occasionally. Wet congested sounds.   GI: Soft. Bowel sounds are normal. She exhibits no distension. There is no tenderness.  Neurological: She is alert.  Severe dysarthria with slurring. Right central 7 sign.  Oriented to self, age and situation. Was  able to [facility] but was surprised that she was in [place]-- thought that she was in [place]. She is able to follow simple motor commands. Slow to process.  Reasonable attention. RUE: 3/5 deltoid, biceps, triceps to 3+ distally. RLE: 2+ HF, KE and 3/5 ADF/PF. LUE and LLE grossly 4 to 4+/5. Sl decrease in LT RUE and RLE. DTR's 1+. No resting tone.   Skin: Skin is warm and dry.  Psychiatric: Her affect is blunt. Her speech is delayed and slurred. She is slowed. Cognition and memory are impaired.    Results for orders placed or performed during the hospital encounter of 07/12/15 (from the past 48 hour(s))  CBC     Status: Abnormal   Collection Time: 07/23/15  3:51 AM  Result Value Ref Range   WBC 7.5 4.0 - 10.5 K/uL   RBC 4.68 3.87 - 5.11 MIL/uL   Hemoglobin 10.3 (L) 12.0 - 15.0 g/dL   HCT 34.0 (L) 36.0 - 46.0 %   MCV 72.6 (L) 78.0 - 100.0 fL   MCH 22.0 (L) 26.0 - 34.0 pg   MCHC 30.3 30.0 - 36.0 g/dL   RDW 19.4 (H) 11.5 - 15.5 %   Platelets 330 150 - 400 K/uL  Basic metabolic panel     Status: Abnormal   Collection Time: 07/23/15  3:51 AM  Result Value Ref Range   Sodium 137 135 - 145 mmol/L   Potassium 3.4 (L) 3.5 - 5.1 mmol/L   Chloride 105 101 - 111 mmol/L   CO2 24 22 - 32 mmol/L   Glucose, Bld 109 (H) 65 - 99 mg/dL   BUN 19 6 - 20 mg/dL   Creatinine, Ser 0.75 0.44 - 1.00 mg/dL   Calcium 8.8 (L) 8.9 - 10.3 mg/dL   GFR calc non Af Amer >60 >60 mL/min   GFR calc Af Amer >60 >60 mL/min    Comment: (NOTE) The eGFR has been calculated using the CKD EPI equation. This calculation has not been validated in all clinical situations. eGFR's persistently <60 mL/min signify possible Chronic Kidney Disease.    Anion gap 8 5 - 15  Basic metabolic panel     Status: Abnormal   Collection Time: 07/23/15  7:13 AM  Result Value Ref Range   Sodium 135 135 - 145 mmol/L   Potassium 3.2 (L) 3.5 - 5.1 mmol/L   Chloride 103 101 - 111 mmol/L   CO2 23 22 - 32 mmol/L   Glucose, Bld 116 (H) 65 - 99  mg/dL   BUN 22 (H) 6 - 20 mg/dL   Creatinine, Ser 0.73 0.44 - 1.00 mg/dL   Calcium 8.8 (L) 8.9 - 10.3 mg/dL   GFR calc non Af Amer >60 >60 mL/min   GFR calc Af Amer >60 >60 mL/min    Comment: (NOTE) The eGFR has been calculated using the CKD EPI equation. This calculation has not been validated in all clinical situations. eGFR's persistently <60 mL/min signify possible Chronic Kidney Disease.    Anion gap 9 5 - 15  CBC     Status: Abnormal   Collection Time: 07/24/15  2:26 AM  Result Value Ref Range   WBC 8.7 4.0 - 10.5 K/uL   RBC 4.42 3.87 - 5.11 MIL/uL   Hemoglobin 9.4 (L) 12.0 - 15.0 g/dL   HCT 31.1 (L) 36.0 - 46.0 %  MCV 70.4 (L) 78.0 - 100.0 fL   MCH 21.3 (L) 26.0 - 34.0 pg   MCHC 30.2 30.0 - 36.0 g/dL   RDW 19.3 (H) 11.5 - 15.5 %   Platelets 387 150 - 400 K/uL  Basic metabolic panel     Status: Abnormal   Collection Time: 07/24/15  2:26 AM  Result Value Ref Range   Sodium 135 135 - 145 mmol/L   Potassium 3.8 3.5 - 5.1 mmol/L   Chloride 105 101 - 111 mmol/L   CO2 23 22 - 32 mmol/L   Glucose, Bld 96 65 - 99 mg/dL   BUN 19 6 - 20 mg/dL   Creatinine, Ser 0.73 0.44 - 1.00 mg/dL   Calcium 8.8 (L) 8.9 - 10.3 mg/dL   GFR calc non Af Amer >60 >60 mL/min   GFR calc Af Amer >60 >60 mL/min    Comment: (NOTE) The eGFR has been calculated using the CKD EPI equation. This calculation has not been validated in all clinical situations. eGFR's persistently <60 mL/min signify possible Chronic Kidney Disease.    Anion gap 7 5 - 15  Magnesium     Status: None   Collection Time: 07/24/15  2:26 AM  Result Value Ref Range   Magnesium 1.8 1.7 - 2.4 mg/dL  Phosphorus     Status: None   Collection Time: 07/24/15  2:26 AM  Result Value Ref Range   Phosphorus 3.2 2.5 - 4.6 mg/dL   Dg Chest Port 1 View  07/23/2015  CLINICAL DATA:  Productive cough. EXAM: PORTABLE CHEST 1 VIEW COMPARISON:  July 21, 2015. FINDINGS: Stable cardiomediastinal silhouette. Tracheostomy tube in grossly good  position. No pneumothorax or pleural effusion is noted. Minimal bibasilar subsegmental atelectasis is noted. Both lungs are clear. The visualized skeletal structures are unremarkable. IMPRESSION: Minimal bibasilar subsegmental atelectasis. Electronically Signed   By: Marijo Conception, M.D.   On: 07/23/2015 07:45       Medical Problem List and Plan: 1.  Functional deficits, right hemiparesis and general debility secondary to hypertensive left parietal and pontine hemorrhages with subsequent complications 2.  DVT Prophylaxis/Anticoagulation: Pharmaceutical: Lovenox 3. Pain Management:  Will try low dose ultram prn  4. Mood: team to provide ego support. LCSW to follow for evaluation.  5. Neuropsych: This patient is not capable of making decisions on her own behalf. 6. Skin/Wound Care:  Continue rectal tube for now. Routine pressure relief measures. Maintain adequate nutrition and hydration.  7. Fluids/Electrolytes/Nutrition: Monitor I/O on current diet.  Check lytes in am. 8. VDRF with trach: Continue CFS #6 with ATC. PMSV during waking hours.  9. HTN: Continue norvasc, lisinopril, apresoline, metoprolol and HCTZ.  Monitor BP every 6 hours and titrate medications as indicated.  10 Hypokalemia: Likely due to diuretics, diarrhea and IVF. Discontinue IVF and increase oral supplement. Recheck lytes in am.  11. ABLA: Monitor for signs of bleeding. Check stool guaiacs.  12. Dysphagia: Monitor tolerance of dysphagia 2, nectar liquids. Needs full supervision at meals. Discontinue IVF.  13. Abnormal LFT:  Discontinue tylenol. 14. Anxiety: Monitor on buspar bid.  15. Diarrhea: Should improve off tube feeds. Completes antibiotics today.  16. Incontinence: Foley d/c today and patient unaware. Will check UA/UCS in am.  Toilet when awake.       Post Admission Physician Evaluation: 1. Functional deficits secondary  to left pontine/parietal hemorrhages and multiple medical complications. 2. Patient is  admitted to receive collaborative, interdisciplinary care between the physiatrist, rehab nursing staff, and  therapy team. 3. Patient's level of medical complexity and substantial therapy needs in context of that medical necessity cannot be provided at a lesser intensity of care such as a SNF. 4. Patient has experienced substantial functional loss from his/her baseline which was documented above under the "Functional History" and "Functional Status" headings.  Judging by the patient's diagnosis, physical exam, and functional history, the patient has potential for functional progress which will result in measurable gains while on inpatient rehab.  These gains will be of substantial and practical use upon discharge  in facilitating mobility and self-care at the household level. 5. Physiatrist will provide 24 hour management of medical needs as well as oversight of the therapy plan/treatment and provide guidance as appropriate regarding the interaction of the two. 6. 24 hour rehab nursing will assist with bladder management, bowel management, safety, skin/wound care, disease management, medication administration and patient education  and help integrate therapy concepts, techniques,education, etc. 7. PT will assess and treat for/with: Lower extremity strength, range of motion, stamina, balance, functional mobility, safety, adaptive techniques and equipment, NMR, cognitive perceptual awareness, education.   Goals are: supervision to min assist. 8. OT will assess and treat for/with: ADL's, functional mobility, safety, upper extremity strength, adaptive techniques and equipment, NMR, cognitive perceptual awareness, community reintegration.   Goals are: supervision to min assist. Therapy may not yet proceed with showering this patient. 9. SLP will assess and treat for/with: cognition, swallowing, speech.  Goals are: mod I to supervision. 10. Case Management and Social Worker will assess and treat for psychological  issues and discharge planning. 11. Team conference will be held weekly to assess progress toward goals and to determine barriers to discharge. 12. Patient will receive at least 3 hours of therapy per day at least 5 days per week. 13. ELOS: 20-25 days       14. Prognosis:  excellent     Meredith Staggers, MD, [facility] 07/24/2015   07/24/2015

## 2015-07-24 NOTE — Progress Notes (Signed)
Physical Therapy Treatment Patient Details Name: Gwendolyn SimonSherry Kawamoto MRN: [id number] DOB: 05/13/1965 Today's Date: 07/24/2015    History of Present Illness 50 yo female with Lt parietal and Lt pontine hemorrhage in setting of HTN urgency. Now with snoring respirations, poor gag reflex, and intermittent hypoxia. 6/2 intubated for airway protection    PT Comments    Pt is progressing well with PT and was able to stand with two person assist today.  We did not feel safe transferring her with stand pivot to bed yet, but I think she will progress to this soon as she continues to mobilize on the inpatient rehab unit.  Planned d/c to CIR later today.    Follow Up Recommendations  CIR     Equipment Recommendations  Wheelchair (measurements PT);Wheelchair cushion (measurements PT);3in1 (PT);Other (comment) (hoyer lift)    Recommendations for Other Services   NA     Precautions / Restrictions Precautions Precautions: Fall    Mobility  Bed Mobility Overal bed mobility: Needs Assistance Bed Mobility: Rolling Rolling: +2 for physical assistance;Mod assist         General bed mobility comments: Two person mod assist to roll bil to get off of maxi sky total lift pad.  Pt assisting by using arms to pull  Transfers Overall transfer level: Needs assistance Equipment used: 2 person hand held assist Transfers: Sit to/from Stand Sit to Stand: +2 physical assistance;Mod assist         General transfer comment: Two person heavy mod assist with bil LEs blocked to get to standing.  Two attempts before successful.  Pt with posterior lean and decrased ability to fully extend hips.   We did not feel safe transfering via stand pivot at this time, so sat pt down and transfered via maxi sky total lift back to bed.   Ambulation/Gait             General Gait Details: unable at this time       Modified Rankin (Stroke Patients Only) Modified Rankin (Stroke Patients Only) Pre-Morbid Rankin  Score: No symptoms Modified Rankin: Severe disability     Balance Overall balance assessment: Needs assistance Sitting-balance support: Bilateral upper extremity supported Sitting balance-Leahy Scale: Poor Sitting balance - Comments: posterior lean, uses arms to maintain sitting with back off of recliner chair Postural control: Posterior lean Standing balance support: Bilateral upper extremity supported Standing balance-Leahy Scale: Poor Standing balance comment: two person mod                    Cognition Arousal/Alertness: Awake/alert Behavior During Therapy: WFL for tasks assessed/performed Overall Cognitive Status: Impaired/Different from baseline Area of Impairment: Attention;Following commands;Safety/judgement;Awareness;Problem solving   Current Attention Level: Sustained   Following Commands: Follows one step commands with increased time (and demonstration)   Awareness: Emergent ("my right leg is weak") Problem Solving: Difficulty sequencing;Requires verbal cues;Requires tactile cues             Pertinent Vitals/Pain Pain Assessment: No/denies pain           PT Goals (current goals can now be found in the care plan section) Acute Rehab PT Goals Patient Stated Goal: unable to state Progress towards PT goals: Progressing toward goals    Frequency  Min 4X/week    PT Plan Frequency needs to be updated       End of Session Equipment Utilized During Treatment: Gait belt;Other (comment) (trach collar) Activity Tolerance: Patient limited by fatigue Patient left: in bed;with call bell/phone  within reach;with bed alarm set     Time: 1413-1434 PT Time Calculation (min) (ACUTE ONLY): 21 min  Charges:  $Therapeutic Activity: 8-22 mins                     Haiven Nardone B. Cederick Broadnax, PT, DPT [phone]   07/24/2015, 5:29 PM

## 2015-07-24 NOTE — Progress Notes (Signed)
Occupational Therapy Treatment Patient Details Name: Gwendolyn SimonSherry Morales MRN: [id number] DOB: February 22, 1965 Today's Date: 07/24/2015    History of present illness 50 yo female with Lt parietal and Lt pontine hemorrhage in setting of HTN urgency. Now with snoring respirations, poor gag reflex, and intermittent hypoxia. 6/2 intubated for airway protection   OT comments  Pt was able to verbalize name of spouse and sons x2 this session. Pt transferred OOB to chair with hoyer lift and adl task performed in supported sitting in chair. Pt smiling and glad to be OOB this session. Pt noted to have skin break down in peri area and should not remain oob for > 45 minutes without position changes.   Follow Up Recommendations  CIR    Equipment Recommendations   (defer to CIR)    Recommendations for Other Services Rehab consult    Precautions / Restrictions Precautions Precautions: Fall       Mobility Bed Mobility Overal bed mobility: Needs Assistance;+2 for physical assistance;+ 2 for safety/equipment Bed Mobility: Rolling;Supine to Sit Rolling: +2 for physical assistance;Mod assist   Supine to sit: +2 for physical assistance        Transfers                 General transfer comment: not assessed this session due to lateral lean    Balance   Sitting-balance support: Single extremity supported;Feet supported Sitting balance-Leahy Scale: Poor                             ADL Overall ADL's : Needs assistance/impaired Eating/Feeding: NPO   Grooming: Oral care;Minimal assistance   Upper Body Bathing: Minimal assitance   Lower Body Bathing: Maximal assistance                         General ADL Comments: Pt with heavy R lean in static sitting. pt requires (A) to progress to EOB static sitting.       Vision                     Perception     Praxis      Cognition   Behavior During Therapy: [facility] ServWFL for tasks assessed/performed Overall Cognitive  Status: No family/caregiver present to determine baseline cognitive functioning                  General Comments: Pt talking with PMV on during session. pt exiting bed on L side supine to sit. Pt at EOB with hoyer lift placed and sky lifted to chair. Pt tolerated transfer well.       Extremity/Trunk Assessment               Exercises     Shoulder Instructions       General Comments      Pertinent Vitals/ Pain       Pain Assessment: No/denies pain  Home Living                                          Prior Functioning/Environment              Frequency Min 3X/week     Progress Toward Goals  OT Goals(current goals can now be found in the care plan section)  Progress towards OT goals: Progressing toward goals  Acute Rehab OT Goals Patient Stated Goal: none stated OT Goal Formulation: With patient Time For Goal Achievement: 07/30/15 Potential to Achieve Goals: Fair ADL Goals Pt Will Perform Grooming: with mod assist;bed level Additional ADL Goal #1: Pt will be Mod A to roll left to A with basic ADLs Additional ADL Goal #2: Pt will be able to maintain static sitting balance EOB with Mod A (with attempts to correct) Additional ADL Goal #3: Pt will be able to turn head/eyes to find items on tray in front of her Additional ADL Goal #4: Pt will be Mod A +2 sit<>stand in prep for transfers  Plan Discharge plan remains appropriate    Co-evaluation                 End of Session Equipment Utilized During Treatment: Oxygen   Activity Tolerance Patient tolerated treatment well   Patient Left in chair;with call bell/phone within reach;with chair alarm set;Other (comment) (SLP in room)   Nurse Communication Mobility status;Precautions;Need for lift equipment        Time: 0900 [phone] OT Time Calculation (min): 23 min  Charges: OT General Charges $OT Visit: 1 Procedure OT Treatments $Self Care/Home Management : 8-22  mins  Boone Master B 07/24/2015, 1:21 PM

## 2015-07-24 NOTE — Evaluation (Signed)
[facility] Speaking Valve - Evaluation Patient Details  Name: Gwendolyn SimonSherry Rorrer MRN: [id number] Date of Birth: [birth year]  Today's Date: 07/24/2015 Time: 0941-1000 SLP Time Calculation (min) (ACUTE ONLY): 19 min  Past Medical History:  Past Medical History  Diagnosis Date  . Hypertension    Past Surgical History: History reviewed. No pertinent past surgical history. HPI:  50 y.o. female with a history of hypertension and noncompliance with treatment brought to the ED and code stroke status following acute onset of a fall, speech difficulty and right-sided weakness. CT head showed a 1.7 x 1.3 cm acute left parietal hemorrhage and a 0.6 cm hemorrhage involving the left lateral pons. No CXR.    Assessment / Plan / Recommendation Clinical Impression  Pt exhibits facial weakness and decreased ROM leading to intermittent and significant loss of bolus (explosive x 1) and difficulty retrieving nectar via straw and increased risk for premature bolus loss. Suspect delayed swallow initiation and residue (multiple swallows) without overt cough. Pt is at increased risk with weak volitional cough and hemorrhage involving left lateral pons. Will closely follow on Dys 2 and nectar, wearing valve with all po's.      SLP Assessment       Follow Up Recommendations  Inpatient Rehab    Frequency and Duration min 2x/week       PMSV Trial PMSV was placed for:  (25-30 min) Able to redirect subglottic air through upper airway: Yes Able to Attain Phonation: Yes Voice Quality: Hoarse;Low vocal intensity Able to Expectorate Secretions: Yes Level of Secretion Expectoration with PMSV: Tracheal Breath Support for Phonation:  (mod-severe) Intelligibility: Intelligibility reduced Word: 50-74% accurate Phrase: 25-49% accurate Sentence: 25-49% accurate Respirations During Trial:  (23-33) SpO2 During Trial:  (93-100) Pulse During Trial:  (95) Behavior: Alert;Controlled;Cooperative   Tracheostomy Tube       Vent Dependency  FiO2 (%): 28 %    Cuff Deflation Trial  GO Tolerated Cuff Deflation:  (trach cuffless) Behavior: Alert;Controlled;Cooperative        Royce MacadamiaLitaker, Terryl Niziolek Willis 07/24/2015, 10:28 AM  Breck CoonsLisa Willis Lonell FaceLitaker M.Ed [employer] Pager [phone]

## 2015-07-24 NOTE — Discharge Summary (Signed)
Stroke Discharge Summary  Patient ID: Gwendolyn Morales   MRN: [id number]      DOB: 11/03/1965  Date of Admission: 07/12/2015 Date of Discharge: 07/24/2015  Attending Physician:  Garvin Fila, MD, Stroke MD Consulting Physician(s):    Merrie Roof, MD (pulmonary/intensive care), Elmarie Shiley, MD (nephrology), and Delice Lesch, MD (Physical Medicine & Rehabtilitation)  Patient's PCP:  No primary care provider on file.  Discharge Diagnoses:    ICH (intracerebral hemorrhage) ([place]) - left parietal and L pontine hemorrhage in setting of hypertensive emergency, cannot rule out CNS vasculitis   Cytotoxic brain edema (HCC)   Malignant hypertension   Acute respiratory failure with hypoxemia (HCC)     Hypokalemia   Azotemia   Productive cough   Tracheostomy status (HCC)    Elevated bilirubin   Adjustment disorder with mixed anxiety and depressed mood   Diastolic dysfunction   Morbid obesity due to excess calories ([place])   H/O noncompliance with medical treatment, presenting hazards to health   Tachypnea   Acute blood loss anemia   Dysphagia, post-stroke   LLL Pneumonia, possible VAP / HCAP   BMI  Body mass index is 41.33 kg/(m^2).  Past Medical History  Diagnosis Date  . Hypertension    History reviewed. No pertinent past surgical history.  Medications to be continued on Rehab . amLODipine  10 mg Per Tube Daily  . antiseptic oral rinse  7 mL Mouth Rinse q12n4p  . busPIRone  7.5 mg Oral BID  . chlorhexidine  15 mL Mouth Rinse BID  . enoxaparin (LOVENOX) injection  40 mg Subcutaneous Q24H  . free water  400 mL Per Tube Q4H  . hydrALAZINE  50 mg Oral Q8H  . hydrochlorothiazide  25 mg Per Tube Daily  . lisinopril  20 mg Oral BID  . metoprolol tartrate  100 mg Per Tube BID  . pantoprazole sodium  40 mg Per Tube Q24H  . piperacillin-tazobactam (ZOSYN)  IV  3.375 g Intravenous Q8H  . potassium chloride  20 mEq Oral BID    LABORATORY STUDIES CBC    Component Value Date/Time   WBC 8.7 07/24/2015 0226   RBC 4.42 07/24/2015 0226   HGB 9.4* 07/24/2015 0226   HCT 31.1* 07/24/2015 0226   PLT 387 07/24/2015 0226   MCV 70.4* 07/24/2015 0226   MCH 21.3* 07/24/2015 0226   MCHC 30.2 07/24/2015 0226   RDW 19.3* 07/24/2015 0226   LYMPHSABS 1.2 07/12/2015 2317   MONOABS 0.6 07/12/2015 2317   EOSABS 0.3 07/12/2015 2317   BASOSABS 0.0 07/12/2015 2317   CMP    Component Value Date/Time   NA 135 07/24/2015 0226   K 3.8 07/24/2015 0226   CL 105 07/24/2015 0226   CO2 23 07/24/2015 0226   GLUCOSE 96 07/24/2015 0226   BUN 19 07/24/2015 0226   CREATININE 0.73 07/24/2015 0226   CALCIUM 8.8* 07/24/2015 0226   PROT 6.3* 07/22/2015 0405   ALBUMIN 2.3* 07/22/2015 0405   AST 174* 07/22/2015 0405   ALT 186* 07/22/2015 0405   ALKPHOS 260* 07/22/2015 0405   BILITOT 4.1* 07/22/2015 0405   GFRNONAA >60 07/24/2015 0226   GFRAA >60 07/24/2015 0226   COAGS Lab Results  Component Value Date   INR 1.14 07/22/2015   INR 1.18 07/12/2015   Lipid Panel    Component Value Date/Time   CHOL 187 07/13/2015 1057   TRIG 525* 07/22/2015 1005   HDL 60 07/13/2015 1057   CHOLHDL 3.1  07/13/2015 1057   VLDL 20 07/13/2015 1057   LDLCALC 107* 07/13/2015 1057   HgbA1C  Lab Results  Component Value Date   HGBA1C 5.5 07/13/2015   Urinalysis    Component Value Date/Time   COLORURINE AMBER* 07/20/2015 1148   APPEARANCEUR HAZY* 07/20/2015 1148   LABSPEC 1.024 07/20/2015 1148   PHURINE 5.5 07/20/2015 1148   GLUCOSEU NEGATIVE 07/20/2015 1148   HGBUR NEGATIVE 07/20/2015 1148   BILIRUBINUR SMALL* 07/20/2015 1148   [place] 07/20/2015 1148   PROTEINUR NEGATIVE 07/20/2015 1148   NITRITE NEGATIVE 07/20/2015 1148   LEUKOCYTESUR TRACE* 07/20/2015 1148   Urine Drug Screen     Component Value Date/Time   LABOPIA NONE DETECTED 07/16/2015 1414   COCAINSCRNUR NONE DETECTED 07/16/2015 1414   LABBENZ NONE DETECTED 07/16/2015 1414   AMPHETMU NONE DETECTED 07/16/2015 1414   THCU  NONE DETECTED 07/16/2015 1414   LABBARB NONE DETECTED 07/16/2015 1414    Alcohol Level    Component Value Date/Time   ETH <5 07/12/2015 2317    SIGNIFICANT DIAGNOSTIC STUDIES Ct Head Wo Contrast 07/14/2015 1. Overall no significant interval change in left frontal opercular hemorrhage with mild localized edema. 2. Stable 6 mm left pontine hemorrhage without significant edema. 3. No other new acute intracranial process.  07/12/2015 1. Acute hemorrhage at the left parietal lobe, measuring 1.7 x 1.3 cm. 2. Small focus of acute hemorrhage at the left side of the pons, measuring 0.6 cm. 3. Scattered small vessel ischemic microangiopathy and mild chronic ischemic change at the basal ganglia bilaterally.  Ct Angio Head and neck W/cm &/or Wo Cm 07/13/2015 Stable acute small volume parenchymal hemorrhages as described in the LEFT pons and LEFT frontal operculum. No underlying extracranial or intracranial stenosis, vascular malformation or aneurysm. No evidence for venous sinus thrombosis. Extensive hypoattenuation of white matter, likely severe chronic microvascular ischemic change related to hypertension. 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 (Dr. Erlinda Hong)  Mr Brain W Wo Contrast 07/19/2015  ADDENDUM: Cavernomas felt to be less likely consideration than that of hemorrhage from hypertensive microvascular changes. This was discussed with Dr. Erlinda Hong. Exam is motion degraded. Posterior left frontal opercular 2 cm hematoma. Left upper pons 9 mm hematoma. These hemorrhage appear acute/ subacute. Mild vasogenic edema surrounds left frontal lobe hematoma. Scattered small chronic blood breakdown products posterior limb right internal capsule, right external capsule, posterior right opercular region, anterior right frontal lobe, left temporal lobe, right occipital lobe and cerebellum bilaterally. Intracranial hemorrhage most likely related to hypertension given  the present clinical setting and surrounding findings. Recommend follow-up until complete clearance. Small acute/subacute nonhemorrhagic infarcts frontal and parietal lobes bilaterally. Remote bilateral thalamic, bilateral centrum semiovale/ corona radiata, bilateral basal ganglia and right pontine infarcts. Marked confluent white matter changes most consistent with result of chronic microvascular disease. Linear dural enhancement of indeterminate etiology described in several benign settings. Opacification petrous apex and partial opacification left mastoid air cells without obstructing lesion of the eustachian tube noted. No definitive findings to suggest petrous a bursitis. If the patient developed cranial nerve symptoms then this possibly would need to be considered.   Dg Chest Port 1 View 07/23/2015   Minimal bibasilar subsegmental atelectasis.  07/21/2015   Improving aeration on the left, with persistent low lung volumes and likely basilar atelectasis/consolidation. Unchanged tracheostomy tube and enteric feeding tube.  07/20/2015  New tracheostomy tube in good position without complicating features. Persistent left lower lobe airspace process.  07/19/2015  Stable left lower lobe pneumonia.  07/18/2015   1. Progressive left mid and lower lung airspace  consolidation. 2.  Support hardware stable in position.  07/17/2015  Persistent patchy LEFT lower lobe opacity which could represent atelectasis or infiltrate, little changed.  07/16/2015   No notable change in patchy left basilar atelectasis.  07/15/2015   1. Left base atelectasis.  07/14/2015   Unchanged bibasilar atelectasis.  07/13/2015   Endotracheal tube 3.5 cm above the carina. Low lung volumes with bibasilar atelectasis.  07/13/2015   Cardiomegaly with mild diffuse bilateral from interstitial prominence. Mild congestive heart failure cannot be excluded. Mild pneumonitis cannot be excluded.   Dg Abd Portable 1v 07/20/2015   Feeding catheter in the distal  stomach.  07/13/2015   NG tube tip in the mid stomach. Electronically Signed   By: Rolm Baptise M.D.   On: 07/13/2015 18:07    TTE - Left ventricle: The cavity size was normal. Wall thickness was normal. Systolic function was normal. The estimated ejection fraction was in the range of 60% to 65%. Wall motion was normal; there were no regional wall motion abnormalities. Doppler parameters are consistent with abnormal left ventricular relaxation (grade 1 diastolic dysfunction). - Aortic valve: There was mild to moderate stenosis. Valve area (VTI): 1.85 cm^2. Valve area (Vmax): 1.42 cm^2. Valve area (Vmean): 1.39 cm^2. - Mitral valve: Severely calcified annulus. The findings are consistent with mild stenosis. - Left atrium: The atrium was mildly dilated.  Renal A duplex  Right kidney is asymmetrically smaller than left and has hyperechoic appearance. Minimal arterial flow obtained in right renal artery and intrarenal flow, which is noted to be atypical. No apparent renal artery stenosis on the left.  Autoimmune work up - elevated ESR, CRP and RF, others negative.     HISTORY OF PRESENT ILLNESS Aurelia Gras is an 50 y.o. female with a history of hypertension and noncompliance with treatment brought to the ED and code stroke status following acute onset of a physical fall at home and afterwards having difficulty with speech output as well as right-sided weakness. She has no previous history of stroke nor TIA. She has not been on antiplatelet therapy. Blood pressure was markedly elevated at 280/150, per EMS. Blood pressure remained elevated at 235/122 after 10 mg of labetalol 2. Cardene drip was started. CT scan of her head showed a 1.7 x 1.3 cm acute left parietal hemorrhage, as well as a 0.6 cm hemorrhage involving the left lateral pons. NIH stroke score was 10. ICH score 3. She was LKW at 8:15 PM on 07/12/2015. Patient was not administered IV t-PA secondary to [place]. She was admitted to the neuro ICU  for further evaluation and treatment.    HOSPITAL COURSE Ms. Sonji Starkes is a 50 y.o. female with history of HTN and noncompliance presenting with speech difficulty and R sided weakness following a fall. BP elevated. CT showed a L parietal and small pontine hemorrhage could be due to malignant hypertension, however, CNS vasculitis is in DDx.  She was intubated and admitted to the ICU. Elevated BP was treated with cardene, then changed to cleviprex. Remains elevated after addition of oral agents. Renal consult felt it was due to hemorrhage and long-standing HTN. 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 Treated with Vanc and Zosyn for PNA. Trach placed and weaned off vent. Discharged to CIR for ongoing therapy.  ICH: left parietal and L pontine hemorrhage in setting of hypertensive emergency. However, location of  ICH is really atypical. 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  MRI multifocal microbleeds and microinfarcts, concerning for HTN vs. Vasculitis  Consider LP or cerebral angio once pt more stabilized.  2D Echo EF 60-65%   No antithrombotic prior to admission  Ongoing aggressive stroke risk factor management  Therapy recommendations: CIR.   Disposition: CIR  Hypertensive Emergency  BP 280/150 on arrival to hospital in setting of neuro symptoms  On cardene, but d/c due to large volume  Put on cleviprex, at max dose today  Put on amlodipine 10 qd, HCTZ 25 qd, lisinopril 20 bid, metoprolol 75 bid and hydralazine 50 q8  Increase metoprolol to 100 bid  Increase SBP goal to < 180  Renal artery duplex - small R kidney w/ right renal artery lack of flow   Renal consulted and feels HTN not  related to renal disease but directly associated with ICH and long-standing HTN  Malignant HTN work up unrevealing at time of discharge  Acute hypoxic respiratory failure LLL Pneumonia, possible VAP / HCAP  Intubated and on vent  CCM on board  Febrile   Blood culture no growth thus far  On zosyn and vanco  Leukocytosis 11.3->14.7->16.7-> 12.7->10.4 (improved)  Copious scecretions  Given inability to extubate, trach placed  once trach placed, able to wean  Other Stroke Risk Factors  Morbid Obesity, Body mass index is 41.26 kg/(m^2).  Other Active Problems   Hypokalemia - supplemented  Hypernatremia and azotemia - treated    DISCHARGE EXAM Blood pressure 130/94, pulse 80, temperature 98.4 F (36.9 C), temperature source Oral, resp. rate 36, height _0  (1.676 m), weight 116.1 kg (255 lb 15.3 oz), SpO2 94 %. General: Slight discomfort; Awake and alert. S/p trach Obese Cardiovascular: RRR Pulmonary: CTA Abdomen: obese, soft; diffuse mild tenderness Extremities: No C/C/E Neurological Exam Mental Status: Awake, alert follows simple commands. Appears depressed Orientation: Nods and shakes head appropriately. Knows self and place Speech: trach Cranial Nerves: Pupils equal both reactive. Fundi were not visualized. B/l incomplete abduction. No nystagmus. Smile symmetric; unable to produce cough Motor Exam:  Tone: Increased throughout Power: LUE and LLE 3+/5, RUE 3+/5 and RLE and LLE 2/5. Sensory: Reports symmetric to LT throughout Coordination: defered Gait:defered  Discharge Diet  DIET DYS 2 Room service appropriate?: Yes; Fluid consistency:: Nectar Thick liquids  DISCHARGE PLAN  Disposition:  Transfer to [facility] for ongoing PT, OT and ST  Due to hemorrhage and risk of bleeding, do not take aspirin, aspirin-containing medications, or ibuprofen products   Recommend ongoing risk factor control by Primary Care Physician  at time of discharge from inpatient rehabilitation.  Follow-up No primary care provider on file. in 2 weeks following discharge from rehab.  Follow-up with Dr. Antony Contras, Stroke Clinic in 2 months, office to schedule an appointment.   45 minutes were spent preparing discharge.  [place] for Pager information 07/24/2015 1:49 PM     I have personally examined this patient, reviewed notes, independently viewed imaging studies, participated in medical decision making and plan of care. I have made any additions or clarifications directly to the above note. Agree with note above.    Antony Contras, MD Medical Director [facility] Pager: [phone] 07/24/2015 4:11  PM

## 2015-07-24 NOTE — Progress Notes (Signed)
Gwendolyn Gong, RN Rehab Admission Coordinator Signed Physical Medicine and Rehabilitation PMR Pre-admission 07/24/2015 1:35 PM  Related encounter: ED to Hosp-Admission (Discharged) from 07/12/2015 in [place] [facility]    Expand All Collapse All   PMR Admission Coordinator Pre-Admission Assessment  [name] is an 50 y.o., female MRN: [id number] DOB: June 08, 1965 Height: _0  (167.6 cm) Weight: 116.1 kg (255 lb 15.3 oz)  Insurance Information  PRIMARY: uninsured  Medicaid Application Date: Case Manager:  Disability Application Date: Case Worker: 07/20/15 Wendall Stade at [phone] with financial counseling has initiated retro and ongoing MAD with [facility] and disability applications  Emergency [employer]    Name Relation Home Work Mobile   [place] Spouse   [phone]   [place]ya, Beddow   [phone]     Current Medical History  Patient Admitting Diagnosis: left parietal and left pontine hemorrhage  History of Present Illness: Gwendolyn Morales is a 50 y.o. female with history of morbid obesity, HTN and medical non-compliance who was admitted on 07/12/15 a fall at home with subsequent right sided weakness and speech difficulty. History taken from chart review. BP at admission markedly elevated at 280/150 and she was started on cardene drip for control. CT head done showing acute hemorrhage 1.7 cm X 1.3 cm at left parietal lobe, small focus of acute hemorrhage left pons and scattered small vessel microangiopathy. She was noted to have intermittent hypoxia with sonorous respirations and was intubated for airway protection. CTA head/neck showed stable hemorrhage, no stenosis, AVM or aneurysm, and advanced dolichoectasia. MRI brain  revealed posterior left frontal opercular hematoma with mild vasogenic edema, left pontine hematoma, acute/ subacute infarcts in bilateral frontal and parietal lobes and scattered chronic micro- hemorrhages. Dr. 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 To consider LP or cerebral angio if indicated.   Patient continued to have copious oral secretions with UE edema. She was started on IV Vanc/Zosyn for treatment of aspiration PNA. Blood pressure difficulty to control and renal ultrasound done revealing minimal flow in right RA with smaller right kidney. 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 Blood pressures improving therefore they signed off 06/09. 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 She was weaned to ATC on 06/10 and vasculitis panel with elevated ESR, CRP and RF otherwise negative. She continues to have issues with anxiety requiring propofol and was transitioned to busparl. She has had diarrhea, epigastric pain with jaundice and was found to have elevated bilirubin of 4.1. Stool on 06/10 was negative fo C- diff and rectal tube placed to help with management. PCCM recommends avoid nephrotoxic medications and to continue zosyn thorough 06/13. PMSV trials initiated yesterday and patient downsized to cuff less # 6 trach. She was started on dysphagia 2, nectar liquids today due to facial weakness and weak cough. Therapy has been working with patient and she is able to tolerate sitting at EOB as well as phonate with PMSV. CIR recommend for follow up therapy.   Total: 10 NIH    Past Medical History  Past Medical History  Diagnosis Date  . Hypertension     Family History  family history is not on file.  Prior Rehab/Hospitalizations:  Has the patient had  major surgery during 100 days prior to admission?  No  Current Medications   Current facility-administered medications:  . 0.9 % sodium chloride infusion, , Intravenous, Continuous, Collene Gobble, MD, Stopped at 07/22/15 0400 . 0.9 % NaCl with KCl 20 mEq/ L infusion, , Intravenous, Continuous, Catha Gosselin, MD, Last Rate: 75 mL/hr at 07/24/15 0700 . acetaminophen (TYLENOL) solution 650 mg, 650 mg, Per Tube, Q4H PRN, Rosalin Hawking, MD, 650 mg at 07/22/15 0300 . acetaminophen (TYLENOL) tablet 650 mg, 650 mg, Oral, Q4H PRN, 650 mg at 07/14/15 0954 **OR** acetaminophen (TYLENOL) suppository 650 mg, 650 mg, Rectal, Q4H PRN, Wallie Char, 650 mg at 07/19/15 0329 . amLODipine (NORVASC) tablet 10 mg, 10 mg, Per Tube, Daily, Garvin Fila, MD, 10 mg at 07/24/15 1002 . antiseptic oral rinse (CPC / CETYLPYRIDINIUM CHLORIDE 0.05%) solution 7 mL, 7 mL, Mouth Rinse, q12n4p, Rosalin Hawking, MD, 7 mL at 07/23/15 1600 . busPIRone (BUSPAR) tablet 7.5 mg, 7.5 mg, Oral, BID, David L Rinehuls, PA-C, 7.5 mg at 07/24/15 1000 . chlorhexidine (PERIDEX) 0.12 % solution 15 mL, 15 mL, Mouth Rinse, BID, Rosalin Hawking, MD, 15 mL at 07/24/15 1000 . clevidipine (CLEVIPREX) infusion 0.5 mg/mL, 0-21 mg/hr, Intravenous, Continuous, Rosalin Hawking, MD, Stopped at 07/21/15 0115 . enoxaparin (LOVENOX) injection 40 mg, 40 mg, Subcutaneous, Q24H, Garvin Fila, MD, 40 mg at 07/24/15 1347 . feeding supplement (VITAL HIGH PROTEIN) liquid 1,000 mL, 1,000 mL, Per Tube, Continuous, Rosalin Hawking, MD, Last Rate: 60 mL/hr at 07/24/15 0700, 1,000 mL at 07/24/15 0700 . free water 400 mL, 400 mL, Per Tube, Q4H, Elmarie Shiley, MD, 400 mL at 07/24/15 0800 . hydrALAZINE (APRESOLINE) tablet 50 mg, 50 mg, Oral, Q8H, Rosalin Hawking, MD, 50 mg at 07/24/15 0602 . hydrochlorothiazide 10 mg/mL oral suspension 25 mg, 25 mg, Per Tube, Daily, Garvin Fila, MD, 25 mg at 07/24/15 1000 . lisinopril (PRINIVIL,ZESTRIL) tablet 20 mg, 20 mg, Oral, BID, Rosalin Hawking, MD, 20 mg at 07/24/15 1003 . loperamide (IMODIUM) 1 MG/5ML solution 2 mg, 2 mg, Per Tube, PRN, Rush Farmer, MD, 2 mg at 07/24/15 0128 . LORazepam (ATIVAN) injection 1-2 mg, 1-2 mg, Intravenous, Q4H PRN, Rush Farmer, MD . metoprolol tartrate (LOPRESSOR) 25 mg/10 mL oral suspension 100 mg, 100 mg, Per Tube, BID, Donzetta Starch, NP, 100 mg at 07/24/15 1002 . morphine 2 MG/ML injection 1 mg, 1 mg, Intravenous, Q6H PRN, Catha Gosselin, MD, 1 mg at 07/22/15 1143 . pantoprazole sodium (PROTONIX) 40 mg/20 mL oral suspension 40 mg, 40 mg, Per Tube, Q24H, Chesley Mires, MD, 40 mg at 07/23/15 1731 . piperacillin-tazobactam (ZOSYN) IVPB 3.375 g, 3.375 g, Intravenous, Q8H, Garvin Fila, MD, 3.375 g at 07/24/15 1001 . potassium chloride (KLOR-CON) packet 20 mEq, 20 mEq, Oral, BID, Catha Gosselin, MD, 20 mEq at 07/24/15 1000 . RESOURCE THICKENUP CLEAR, , Oral, PRN, Garvin Fila, MD  Patients Current Diet: DIET DYS 2 Room service appropriate?: Yes; Fluid consistency:: Nectar Thick cortrak remains in place  Precautions / Restrictions Precautions Precautions: Fall Restrictions Weight Bearing Restrictions: No   Has the patient had 2 or more falls or a fall with injury in the past year?No  Prior Activity Level Community (5-7x/wk): Independent and driving pta . Completely independent pta.  Home Assistive Devices / Equipment Home Assistive Devices/Equipment: None Home Equipment: None  Prior Device Use: Indicate devices/aids used by the patient prior to current illness, exacerbation or injury? None of the above  Prior Functional Level Prior Function Level of Independence: Independent  Self Care:  Did the patient need help bathing, dressing, using the toilet or eating? Independent  Indoor Mobility: Did the patient need assistance with walking from room to room (with or without device)? Independent  Stairs: Did the patient need assistance with internal or external stairs (with or  without device)? Independent  Functional Cognition: Did the patient need help planning regular tasks such as shopping or remembering to take medications? Independent  Current Functional Level Cognition  Arousal/Alertness: Awake/alert Overall Cognitive Status: No family/caregiver present to determine baseline cognitive functioning Difficult to assess due to: Impaired communication, Intubated Current Attention Level: Selective Orientation Level: Oriented X4 Following Commands: Follows one step commands consistently General Comments: Pt talking with PMV on during session. pt exiting bed on L side supine to sit. Pt at EOB with hoyer lift placed and sky lifted to chair. Pt tolerated transfer well.  Attention: Sustained Sustained Attention: Impaired Sustained Attention Impairment: Verbal basic, Functional basic Memory: (appears fuctional informally) Awareness: Impaired Awareness Impairment: Emergent impairment, Anticipatory impairment Problem Solving: (TBA further) Behaviors: (borderline labile) Safety/Judgment: Appears intact   Extremity Assessment (includes Sensation/Coordination)  Upper Extremity Assessment: RUE deficits/detail, LUE deficits/detail RUE Deficits / Details: edematous throughout, 1+ movement RUE Coordination: decreased fine motor, decreased gross motor LUE Deficits / Details: edematous throughout, 2 movement througout LUE Coordination: decreased fine motor, decreased gross motor  Lower Extremity Assessment: Generalized weakness (limited AROM and strength LLE) RLE Deficits / Details: no noted movement throught session on RLE RLE Coordination: decreased fine motor, decreased gross motor    ADLs  Overall ADL's : Needs assistance/impaired Eating/Feeding: NPO Grooming: Oral care, Minimal assistance Grooming Details (indicate cue type and reason): Hand over hand assist to complete task but pt able to initiate with washcloth in L hand. Upper Body Bathing:  Minimal assitance Lower Body Bathing: Maximal assistance Lower Body Bathing Details (indicate cue type and reason): Total assist to clean pt after BM. General ADL Comments: Pt with heavy R lean in static sitting. pt requires (A) to progress to EOB static sitting.     Mobility  Overal bed mobility: Needs Assistance, +2 for physical assistance, + 2 for safety/equipment Bed Mobility: Rolling, Supine to Sit Rolling: +2 for physical assistance, Mod assist Supine to sit: +2 for physical assistance Sit to supine: Total assist, +2 for physical assistance, +2 for safety/equipment General bed mobility comments: Max assist via helicopter technique to come to EOB. Patient with some LE movement to EOB with multi-modal cues. Max assist of 2 persons for elevation of trunk to upright position with posterior support at EOB. Total assist to return to supine.    Transfers  General transfer comment: not assessed this session due to lateral lean    Ambulation / Gait / Stairs / Wheelchair Mobility  Ambulation/Gait General Gait Details: not assessed today    Posture / Balance Dynamic Sitting Balance Sitting balance - Comments: patient able to tolerate static sitting for periods of ~20-30 seconds without assist Balance Overall balance assessment: Needs assistance Sitting-balance support: Single extremity supported, Feet supported Sitting balance-Leahy Scale: Poor Sitting balance - Comments: patient able to tolerate static sitting for periods of ~20-30 seconds without assist    Special needs/care consideration Oxygen 28 % trach collar Special Bed overlay mattress Trach Size 6 cuffless trach 07/23/15.  Bowel mgmt: rectal tube due to diarhea Bladder mgmt:indwelling catheter removed 6/13   Previous Home Environment Living Arrangements: (59 yo son, Kathlyn [place] lives at home. Older 43 yo son not in the h) Lives With: Spouse, Son Available Help  at Discharge: Available 24  hours/day Type of Home: House Home Layout: One level Home Access: (thorugh the garage, 3 steps other entrance) Bathroom Shower/Tub: Chiropodist: Standard Bathroom Accessibility: Yes How Accessible: Accessible via walker Foreman: No  Discharge Living Setting Plans for Discharge Living Setting: Patient's home, Lives with (comment) (spouse and 25 yo son, Kathlyn [place]) Type of Home at Discharge: House Discharge Home Layout: One level Discharge Home Access: Level entry (entry through garage) Discharge Bathroom Shower/Tub: Tub/shower unit, Curtain Discharge Bathroom Toilet: Standard Discharge Bathroom Accessibility: Yes How Accessible: Accessible via walker Does the patient have any problems obtaining your medications?: Yes (Describe) (pt is uninsured)  Social/Family/Support Systems Patient Roles: Spouse, Parent Contact Information: Jocelin Schuelke, spouse and 33 yo son, Kathlyn [place] Anticipated Caregiver: spouse, son, and family friends Anticipated Caregiver's Contact Information: see above Ability/Limitations of Caregiver: spouse workks days. Son unemployed.  Caregiver Availability: 24/7 Discharge Plan Discussed with Primary Caregiver: Yes Is Caregiver In Agreement with Plan?: Yes Does Caregiver/Family have Issues with Lodging/Transportation while Pt is in Rehab?: No Spouse works days as apartment maintenance. He is a large man 6'3 and 325 lbs. He states he has experience in the caregiver role as he assisted with the care of his own parents who were both amputees. He is concerned that his 82 yo son, Kathlyn [place] could not assist with toileting due to her own modesty. He plans to asks family friends and church members to assist. Spouse can work half days with advanced notice, to come in for family training.  Goals/Additional Needs Patient/Family Goal for Rehab: MOd assist with PT and OT, supervision to min assist with SLP Expected length of stay: [facility]OS 20-24 days Equipment  Needs: #6 cuffless trach; rectal tube Pt/Family Agrees to Admission and willing to participate: Yes Program Orientation Provided & Reviewed with Pt/Caregiver Including Roles & Responsibilities: Yes  Decrease burden of Care through IP rehab admission: n/a  Possible need for SNF placement upon discharge:not anticipated, but spouse has discussed openly the possibility if pt fails to progress.Not his preference.  Patient Condition: This patient's condition remains as documented in the consult dated 07/23/2015, in which the Rehabilitation Physician determined and documented that the patient's condition is appropriate for intensive rehabilitative care in an inpatient rehabilitation facility. Will admit to inpatient rehab today.  Preadmission Screen Completed By: Cleatrice Burke, 07/24/2015 1:49 PM ______________________________________________________________________  Discussed status with Dr. Naaman Plummer on 07/24/2015 at 1349 and received telephone approval for admission today.  Admission Coordinator: Cleatrice Burke, time 0712 Date 07/24/2015          Cosigned by: Meredith Staggers, MD at 07/24/2015 1:53 PM  Revision History     Date/Time User Provider Type Action   07/24/2015 1:53 PM Meredith Staggers, MD Physician Cosign   07/24/2015 1:51 PM Gwendolyn Gong, RN Rehab Admission Coordinator Sign

## 2015-07-24 NOTE — Progress Notes (Signed)
Ankit Lorie Phenix, MD Physician Signed Physical Medicine and Rehabilitation Consult Note 07/23/2015 8:49 AM  Related encounter: ED to Hosp-Admission (Discharged) from 07/12/2015 in [facility]    Expand All Collapse All        Physical Medicine and Rehabilitation Consult  Reason for Consult: Right sided weakness, slurred speech,  Referring Physician: Dr. Erlinda Hong.    HPI: Gwendolyn Morales is a 50 y.o. female with history of morbid obesity, HTN and medical non-compliance who was admitted on 07/12/15 a fall at home with subsequent right sided weakness and speech difficulty. History taken from chart review. BP at admission markedly elevated at 280/150 and she was started on cardene drip for control. CT head done showing acute hemorrhage 1.7 cm X 1.3 cm at left parietal lobe, small focus of acute hemorrhage left pons and scattered small vessel microangiopathy. She was noted to have intermittent hypoxia with sonorous respirations and was intubated for airway protection. CTA head/neck showed stable hemorrhage, no stenosis, AVM or aneurysm, and advanced dolichoectasia. 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 Dr. 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 To consider LP or cerebral angio if indicated.   Patient continues to have copious oral secretions with UE edema. She was started on IV Vanc/Zosyn for treatment of aspiration PNA. Blood pressure difficulty to control and renal ultrasound done revealing minimal flow in right RA with smaller right kidney. 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 Blood pressures improving therefore they signed off 06/09. 2D  echo with EF 60-65%, mild to moderate AVS, severely calcified MV with mild stenosis and grade 1 diastolic dysfunction. She was weaned to ATC on 06/10 and vasculitis panel with elevated ESR, CRP and RF otherwise negative. She continues to have issues with anxiety requiring propofol. Has had reports of epigastric pain with jaundice and was found to have elevated bilirubin of 4.1. Therapy has been working with patient while on vent and CIR recommend for follow up therapy.   Per ST-was able to tolerate PMSV trials for total of 15 minutes this am.  Review of Systems  Unable to perform ROS: language      Past Medical History  Diagnosis Date  . Hypertension    History reviewed. No pertinent past surgical history.    History reviewed. No pertinent family history.    Social History: Married? Independent without AD and unemployed? She indicated that that she has never smoked and does not drink alcohol or use illicit drugs.    Allergies: No Known Allergies    Medications Prior to Admission  Medication Sig Dispense Refill  . diphenhydrAMINE (BENADRYL) 25 MG tablet Take 25 mg by mouth every 6 (six) hours as needed for allergies.    Marland Kitchen ibuprofen (ADVIL,MOTRIN) 400 MG tablet Take 400 mg by mouth every 6 (six) hours as needed for mild pain.    . Multiple Vitamin (MULTIVITAMIN WITH MINERALS) TABS tablet Take 1 tablet by mouth daily.      Home: Home Living Family/patient expects to be discharged to:: Inpatient rehab Living Arrangements: Spouse/significant other, Children Available Help at Discharge: Family Type of Home: House Home Access: Level entry Home Layout: One level Bathroom Shower/Tub: Tub/shower unit Home Equipment: None Lives With: Spouse  Functional History: Prior Function Level of Independence: Independent Functional Status:  Mobility: Bed Mobility Overal bed mobility: Needs Assistance, +2 for physical assistance Bed Mobility: Rolling,  Supine to Sit, Sit to Supine Rolling: Total assist, +2 for physical assistance Supine to sit: Max assist, +2 for physical assistance, +2 for safety/equipment Sit to supine: Total assist, +2 for physical assistance, +2 for safety/equipment General bed mobility comments: Max assist via helicopter technique to come to EOB. Patient with some LE movement to EOB with multi-modal cues. Max assist of 2 persons for elevation of trunk to upright position with posterior support at EOB. Total assist to return to supine. Transfers General transfer comment: not assessed today Ambulation/Gait General Gait Details: not assessed today    ADL: ADL Overall ADL's : Needs assistance/impaired Grooming: Wash/dry face, Moderate assistance, Sitting Grooming Details (indicate cue type and reason): Hand over hand assist to complete task but pt able to initiate with washcloth in L hand. Lower Body Bathing: Total assistance, +2 for physical assistance, Bed level Lower Body Bathing Details (indicate cue type and reason): Total assist to clean pt after BM. General ADL Comments: Pt tolerated sitting EOB with total posterior assist for ~7 minutes. In sitting pt able to participate in grooming activity and LUE ROM/strengthening (shoulder flex/ext, elbow flex/ext). BP in supine 161/98, sitting EOB 171/128. Max HR up to 111.   Cognition: Cognition Overall Cognitive Status: Difficult to assess Arousal/Alertness: Awake/alert Orientation Level: Oriented X4 Attention: Sustained Sustained Attention: Impaired Sustained Attention Impairment: Verbal basic, Functional basic Memory: (appears fuctional informally) Awareness: Impaired Awareness Impairment: Emergent impairment, Anticipatory impairment Problem Solving: (TBA further) Behaviors: (borderline labile) Safety/Judgment: Appears intact Cognition Arousal/Alertness: Awake/alert Overall Cognitive Status: Difficult to assess Area of Impairment: Attention, Following  commands Current Attention Level: Focused Following Commands: Follows one step commands consistently Difficult to assess due to: Impaired communication, Intubated   Blood pressure 122/85, pulse 92, temperature 99.8 F (37.7 C), temperature source Rectal, resp. rate 30, height 5' 6"  (1.676 m), weight 115.9 kg (255 lb 8.2 oz), SpO2 98 %. Physical Exam  Nursing note and vitals reviewed. Constitutional: She appears well-developed and well-nourished.  Obese female  HENT:  Head: Normocephalic and atraumatic.  Mouth/Throat: Oropharynx is clear and moist.  Eyes: Conjunctivae and EOM are normal. Pupils are equal, round, and reactive to light. Scleral icterus is present.  Neck:  Cuffed #6 trach in place.  Cardiovascular: Normal rate and regular rhythm.  Murmur heard. Respiratory: Effort normal. No stridor. She has rhonchi. She exhibits no tenderness.  GI: Soft. Bowel sounds are normal. She exhibits distension. There is tenderness.  + NGT  Musculoskeletal: She exhibits edema. She exhibits no tenderness.  Moderate edema left forearm and hand.  Neurological: She is alert.  Anxious appearing and unable to phonate with trach occluded--some anxiety noted. ?apraxia.  Slow to respond but was able to nod appropriately for basic Y/N questions with occasional cues.  Motor: Bilateral upper extremities 4+/5 proximal to distal Bilateral lower extremities: Hip flexion, knee extension 2+/5, ankle dorsi/plantarflexion 3+/5 DTRs symmetric  Skin: Skin is warm and dry. No rash noted.  Psychiatric: Her mood appears anxious. She is slowed.     Lab Results Last 24 Hours    Results for orders placed or performed during the hospital encounter of 07/12/15 (from the past 24 hour(s))  Triglycerides Status: Abnormal   Collection Time: 07/22/15 10:05 AM  Result Value Ref Range   Triglycerides 525 (H) <150 mg/dL  CBC Status: Abnormal   Collection Time: 07/23/15  3:51 AM  Result  Value Ref Range   WBC 7.5 4.0 - 10.5 K/uL   RBC 4.68 3.87 - 5.11 MIL/uL   Hemoglobin 10.3 (L) 12.0 - 15.0 g/dL   HCT 34.0 (L) 36.0 - 46.0 %   MCV 72.6 (L) 78.0 - 100.0 fL   MCH 22.0 (L) 26.0 - 34.0 pg   MCHC 30.3 30.0 - 36.0 g/dL   RDW 19.4 (H) 11.5 - 15.5 %   Platelets 330 150 - 400 K/uL  Basic metabolic panel Status: Abnormal   Collection Time: 07/23/15 3:51 AM  Result Value Ref Range   Sodium 137 135 - 145 mmol/L   Potassium 3.4 (L) 3.5 - 5.1 mmol/L   Chloride 105 101 - 111 mmol/L   CO2 24 22 - 32 mmol/L   Glucose, Bld 109 (H) 65 - 99 mg/dL   BUN 19 6 - 20 mg/dL   Creatinine, Ser 0.75 0.44 - 1.00 mg/dL   Calcium 8.8 (L) 8.9 - 10.3 mg/dL   GFR calc non Af Amer >60 >60 mL/min   GFR calc Af Amer >60 >60 mL/min   Anion gap 8 5 - 15  Basic metabolic panel Status: Abnormal   Collection Time: 07/23/15 7:13 AM  Result Value Ref Range   Sodium 135 135 - 145 mmol/L   Potassium 3.2 (L) 3.5 - 5.1 mmol/L   Chloride 103 101 - 111 mmol/L   CO2 23 22 - 32 mmol/L   Glucose, Bld 116 (H) 65 - 99 mg/dL   BUN 22 (H) 6 - 20 mg/dL   Creatinine, Ser 0.73 0.44 - 1.00 mg/dL   Calcium 8.8 (L) 8.9 - 10.3 mg/dL   GFR calc non Af Amer >60 >60 mL/min   GFR calc Af Amer >60 >60 mL/min   Anion gap 9 5 - 15      Imaging Results (Last 48 hours)    Dg Chest Port 1 View  07/23/2015 CLINICAL DATA: Productive cough. EXAM: PORTABLE CHEST 1 VIEW COMPARISON: July 21, 2015. FINDINGS: Stable cardiomediastinal silhouette. Tracheostomy tube in grossly good position. No pneumothorax or pleural effusion is noted. Minimal bibasilar subsegmental atelectasis is noted. Both lungs are clear. The visualized skeletal structures are unremarkable. IMPRESSION: Minimal bibasilar subsegmental atelectasis. Electronically Signed By: Marijo Conception, M.D. On: 07/23/2015 07:45      Assessment/Plan: Diagnosis: posterior left frontal opercular hematoma Labs and images independently reviewed. Records reviewed and summated above. Stroke: Continue secondary stroke prophylaxis and Risk Factor Modification listed below:  Blood Pressure Management: Continue current medication with prn's with permisive HTN per primary team ?Right sided hemiparesis:  Motor recovery: Fluoxetine  1. Does the need for close, 24 hr/day medical supervision in concert with the patient's rehab needs make it unreasonable for this patient to be served in a less intensive setting? Yes 2. Co-Morbidities requiring supervision/potential complications: elevated bilirubin (consider further workup), anxiety (ensure anxiety and resulting apprehension do not limit functional progress; consider prn medications if warranted), grade 1 diastolic dysfunction (monitor for fluid overload), aspiration PNA (continue abx), morbid obesity (Body mass index is 41.26 kg/(m^2)., diet and exercise education, encourage weight loss to increase endurance and promote overall health), HTN (monitor and provide prns in accordance with increased physical exertion and pain), medical non-compliance (continue to educate and stress importance of compliance), tachypnea (monitor RR and O2 Sats with increased physical exertion), hypokalemia (continue to monitor and replete as necessary), ABLA (transfuse if necessary to ensure appropriate perfusion for increased activity tolerance), poststroke dysphagia (continue SLP,  advance diet as tolerated) 3. Due to bladder management, safety, disease management, medication administration and patient education, does the patient require 24 hr/day rehab nursing? Yes 4. Does the patient require coordinated care of a physician, rehab nurse, PT (1-2 hrs/day, 5 days/week), OT (1-2 hrs/day, 5 days/week) and SLP (1-2 hrs/day, 5 days/week) to address physical and functional deficits in the context of the above  medical diagnosis(es)? Yes Addressing deficits in the following areas: balance, endurance, locomotion, strength, transferring, bowel/bladder control, bathing, dressing, feeding, grooming, toileting, cognition, speech, language, swallowing and psychosocial support 5. Can the patient actively participate in an intensive therapy program of at least 3 hrs of therapy per day at least 5 days per week? Not at present 6. The potential for patient to make measurable gains while on inpatient rehab is excellent 7. Anticipated functional outcomes upon discharge from inpatient rehab are mod assist with PT, mod assist with OT, supervision and min assist with SLP. 8. Estimated rehab length of stay to reach the above functional goals is: 20-24 days. 9. Does the patient have adequate social supports and living environment to accommodate these discharge functional goals? Potentially 10. Anticipated D/C setting: Home 11. Anticipated post D/C treatments: HH therapy and Home excercise program 12. Overall Rehab/Functional Prognosis: good  RECOMMENDATIONS: This patient's condition is appropriate for continued rehabilitative care in the following setting: Will need to clarify caregiver support at discharge. On most recent evaluation with therapies, patient require significant assistance. If this is not improved and patient does not have adequate support, may need SNF. However, will continue to follow as patient undergoes medical workup and reevaluate once workup has been completed. Patient has agreed to participate in recommended program. Potentially Note that insurance prior authorization may be required for reimbursement for recommended care.  Comment: Rehab Admissions Coordinator to follow up.  Delice Lesch, MD 07/23/2015       Revision History     Date/Time User Provider Type Action   07/23/2015 11:31 AM Ankit Lorie Phenix, MD Physician Sign   07/23/2015 9:19 AM Bary Leriche, PA-C Physician Assistant Share    View Details Report

## 2015-07-24 NOTE — Progress Notes (Signed)
Received call back from spouse and we discussed an inpt rehab admission. He is in agreement. I contacted Dr. [name] and pt ready for d/c to CIR today. I will alert RN CM and SW and make the arrangements to admit today. [phone]

## 2015-07-24 NOTE — Interval H&P Note (Signed)
Gwendolyn SimonSherry Morales was admitted today to Inpatient Rehabilitation with the diagnosis of ICH.  The patient's history has been reviewed, patient examined, and there is no change in status.  Patient continues to be appropriate for intensive inpatient rehabilitation.  I have reviewed the patient's chart and labs.  Questions were answered to the patient's satisfaction. The PAPE has been reviewed and assessment remains appropriate.  SWARTZ,ZACHARY T 07/24/2015, 6:18 PM

## 2015-07-24 NOTE — Progress Notes (Signed)
PULMONARY / CRITICAL CARE MEDICINE   Name: Gwendolyn Morales MRN: [id number] DOB: 16-Jul-1965    ADMISSION DATE:  07/12/2015 CONSULTATION DATE:  07/13/2015  REFERRING MD:  Dr. [name]  CHIEF COMPLAINT:  Fall  HISTORY OF PRESENT ILLNESS:   50 yo female presented to ER after falling.  She had difficulty with speech and Rt sided weakness.  She had BP 280/150.  CT head showed 1.7 x 1.3 cm Lt parietal hemorrhage and 0.6 cm lateral pons hemorrhage.  NIH stroke score 10.  She was started on nicardipine for BP control, and admitted by stroke service.  She was noted to having snoring respiratory pattern, poor gag reflex, and intermittent hypoxia. Required intubation.  SUBJECTIVE:  No events overnight, tolerated TC well overnight.  VITAL SIGNS: BP 132/103 mmHg  Pulse 99  Temp(Src) 98.5 F (36.9 C) (Oral)  Resp 26  Ht  (1.676 m)  Wt 116.1 kg (255 lb 15.3 oz)  BMI 41.33 kg/m2  SpO2 92%  HEMODYNAMICS:    VENTILATOR SETTINGS: Vent Mode:  [-]  FiO2 (%):  [28 %] 28 %  INTAKE / OUTPUT: I/O last 3 completed shifts: In: 5460 [I.V.:2700; NG/GT:2560; IV Piggyback:200] Out: 3420 [Urine:3220; Stool:200]  PHYSICAL EXAMINATION: General:  Awake, comfortable, in the chair, speaking. Neuro:  Follows commands, tracks and answers questions. HEENT: No JVD, trach in place, clean Cardiovascular:  s1 s2 RRT no r Lungs:  Ronchi, decreased in bases Abdomen:  Soft, obese, NT,  Musculoskeletal:  No sig edema Skin:  No rash, jaundiced, mildly.  LABS:  BMET  Recent Labs Lab 07/23/15 0351 07/23/15 0713 07/24/15 0226  NA 137 135 135  K 3.4* 3.2* 3.8  CL 105 103 105  CO2 BUN 19 22* 19  CREATININE 0.75 0.73 0.73  GLUCOSE 109* 116* 96   Electrolytes  Recent Labs Lab 07/21/15 0325 07/22/15 0405 07/23/15 0351 07/23/15 0713 07/24/15 0226  CALCIUM 8.7* 8.9 8.8* 8.8* 8.8*  MG 2.1 2.1  --   --  1.8  PHOS  --  2.8  --   --  3.2   CBC  Recent Labs Lab 07/22/15 0405  07/23/15 0351 07/24/15 0226  WBC 8.6 7.5 8.7  HGB 10.3* 10.3* 9.4*  HCT 35.1* 34.0* 31.1*  PLT 361 330 387   Coag's  Recent Labs Lab 07/22/15 0405  INR 1.14   Sepsis Markers No results for input(s): LATICACIDVEN, PROCALCITON, O2SATVEN in the last 168 hours.  ABG No results for input(s): PHART, PCO2ART, PO2ART in the last 168 hours.  Liver Enzymes  Recent Labs Lab 07/21/15 1016 07/22/15 0405  AST 178* 174*  ALT 194* 186*  ALKPHOS 240* 260*  BILITOT 4.6* 4.1*  ALBUMIN 2.6* 2.3*   Cardiac Enzymes No results for input(s): TROPONINI, PROBNP in the last 168 hours.  Glucose  Recent Labs Lab 07/21/15 1149 07/21/15 1614 07/21/15 1924 07/21/15 2310 07/22/15 0349 07/22/15 0716  GLUCAP 112* 104* 109* 141* 111* 104*   Imaging No results found. STUDIES:  6/01 CT head >> 1.7 x 1.3 cm Lt parietal ICH, 0.6 cm Lt pons ICH, scattered small vessel ischemia 6/03 Echo >> LVEF 65%, grade 1 diastolic dysfxn MRI Brain 6/6 >> L frontal occipital hematoma 2cm, L upper pons 9mm, mild vasogenic edema.   CULTURES: Blood 6/5 >>  Blood 6/7 >>  resp 6/8 >> moderate GPC >>   ANTIBIOTICS: Pip/tazo 6/7 >> 6/13 vanco 6/7 >> 6/10  SIGNIFICANT EVENTS: 6/01 Admit 6/2- HTN, poor airway control, intubated  6/4 very awake  LINES/TUBES: 6/2 ett>>6/9 Trach Ninetta Lights(JY) 6/9>>>  I reviewed CXR myself, trach in good position.  DISCUSSION: 50 yo female with Lt parietal and Lt pontine hemorrhage in setting of HTN urgency.  Now with snoring respirations, poor gag reflex, and intermittent hypoxia. 6/2 intubated for airway protection. Concern for UA swelling and irritation. MS improved. LLL HCAP. Plan for trach on 6/9  ASSESSMENT / PLAN:  NEUROLOGIC A:   Acute Lt parietal and pontine hemorrhage P:   Imaging per neuro > CT, MRI brain as above. BP control as ordered. Ambulate.  PULMONARY A: Acute hypoxic respiratory failure Upper airway, poor control secretions LLL PNA P:   F/u  CXR Secretion improved, changed to cuffless 6 trach. Apply PMV per speech. SLP passed, patient is eating.  CARDIOVASCULAR A:  HTN urgency. P:  Clevidipine to keep SBP < 160, off now Amlodipine 10, hydralazine scheduled, lisinopril, metop 75 bid,  Echo ef 65% g1df Addition clonidine if remains hypertensive  RENAL  Recent Labs Lab 07/23/15 0351 07/23/15 0713 07/24/15 0226  K 3.4* 3.2* 3.8   A:   Hypokalemia. P:   Replace electrolytes as needed D/C lasix. Follow BMP  GASTROINTESTINAL A:   Dysphagia prior to intubation  Hyperbili and LFTs Passed swallow evaluation. P:   D/C all offending agents from a hepatic standpoint Diet per SLP Hold off PEG for now.  HEMATOLOGIC A:   DVT prevention P:  SCDs for DVT prophylaxis Follow CBC  INFECTIOUS A:   New LLL infiltrate, possible VAP / HCAP P:   D/C vanc. Continue zosyn last dose 6/13. F/U on culture noted.  ENDOCRINE CBG (last 3)   Recent Labs  07/21/15 2310 07/22/15 0349 07/22/15 0716  GLUCAP 141* 111* 104*   A:   Mild hyperglycemia.  P:    Monitor blood sugar on BMET  FAMILY  - Updates: updated patient bedside 6/13.  - Inter-disciplinary family meet or Palliative Care meeting due by: 6/9  Discussed with neurology.  Changed trach to 6 cuffless tolerating PMV and eating.  Alyson ReedyWesam G. Yacoub, M.D. [facility]. Pager: [phone]. After hours pager: [phone].

## 2015-07-24 NOTE — H&P (Signed)
Physical Medicine and Rehabilitation Admission H&P    CC:  Right sided weakness, right facial weakness, dysphagia with aspiration PNA and trach   HPI: Gwendolyn Morales is a 50 y.o. female with history of morbid obesity, HTN and medical non-compliance who was admitted on 07/12/15 a fall at home with subsequent right sided weakness and speech difficulty. History taken from chart review. BP at admission markedly elevated at 280/150 and she was started on cardene drip for control. CT head done showing acute hemorrhage 1.7 cm X 1.3 cm at left parietal lobe, small focus of acute hemorrhage left pons and scattered small vessel microangiopathy. She was noted to have intermittent hypoxia with sonorous respirations and was intubated for airway protection. CTA head/neck showed stable hemorrhage, no stenosis, AVM or aneurysm, and advanced dolichoectasia. 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 Dr. 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 To consider LP or cerebral angio if indicated.   Patient continues to have copious oral secretions with UE edema. She was started on IV Vanc/Zosyn for treatment of aspiration PNA. Blood pressure difficulty to control and renal ultrasound done revealing minimal flow in right RA with smaller right kidney. 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 Blood pressures improving therefore they signed off 06/09. 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 She was weaned to ATC on 06/10 and vasculitis panel with elevated ESR, CRP and RF otherwise negative. She continues to have issues with  anxiety requiring propofol and was transitioned to busparl. She has had diarrhea, epigastric pain with jaundice and was found to have elevated bilirubin of 4.1. Stool on 06/10 was negative fo C- diff and rectal tube placed to help with management.   PCCM recommends avoid nephrotoxic medications and to continue zosyn thorough 06/13.  PMSV trials initiated yesterday and patient downsized to cuff less # 6 trach.  She was started on dysphagia 2, nectar liquids today due to facial weakness and weak cough. Therapy has been working with patient and she is able to tolerate sitting at EOB as well as phonate with PMSV.  CIR recommend for follow up therapy.    Review of Systems  HENT: Negative for hearing loss.   Eyes: Negative for blurred vision and double vision.  Respiratory: Negative for cough and shortness of breath.   Cardiovascular: Negative for chest pain and palpitations.  Gastrointestinal: Positive for diarrhea. Negative for heartburn, nausea and abdominal pain.  Genitourinary:       Incontinent of bladder at this time  Musculoskeletal: Negative for myalgias.  Neurological: Positive for sensory change, speech change, focal weakness and weakness. Negative for headaches.  Psychiatric/Behavioral: The patient is not nervous/anxious.       Past Medical History  Diagnosis Date  . Hypertension     History reviewed. No pertinent past surgical history.    History reviewed. No pertinent family history.    Social History:  reports that she has never smoked. She does not have any smokeless tobacco history on file. She reports that she does not drink alcohol or use illicit drugs.    Allergies: No Known Allergies    Medications Prior to Admission  Medication Sig Dispense  Refill  . diphenhydrAMINE (BENADRYL) 25 MG tablet Take 25 mg by mouth every 6 (six) hours as needed for allergies.    Marland Kitchen ibuprofen (ADVIL,MOTRIN) 400 MG tablet Take 400 mg by mouth every 6 (six) hours as needed for mild pain.     . Multiple Vitamin (MULTIVITAMIN WITH MINERALS) TABS tablet Take 1 tablet by mouth daily.      Home: Home Living Family/patient expects to be discharged to:: Inpatient rehab Living Arrangements: Spouse/significant other, Children Available Help at Discharge: Family Type of Home: House Home Access: Level entry Home Layout: One level Bathroom Shower/Tub: Producer, television/film/video: None  Lives With: Spouse   Functional History: Prior Function Level of Independence: Independent  Functional Status:  Mobility: Bed Mobility Overal bed mobility: Needs Assistance, +2 for physical assistance, + 2 for safety/equipment Bed Mobility: Rolling, Supine to Sit Rolling: +2 for physical assistance, Mod assist Supine to sit: +2 for physical assistance Sit to supine: Total assist, +2 for physical assistance, +2 for safety/equipment General bed mobility comments: Max assist via helicopter technique to come to EOB. Patient with some LE movement to EOB with multi-modal cues. Max assist of 2 persons for elevation of trunk to upright position with posterior support at EOB. Total assist to return to supine. Transfers General transfer comment: not assessed today Ambulation/Gait General Gait Details: not assessed today    ADL: ADL Overall ADL's : Needs assistance/impaired Eating/Feeding: NPO Grooming: Oral care, Minimal assistance Grooming Details (indicate cue type and reason): Hand over hand assist to complete task but pt able to initiate with washcloth in L hand. Upper Body Bathing: Minimal assitance Lower Body Bathing: Maximal assistance Lower Body Bathing Details (indicate cue type and reason): Total assist to clean pt after BM. General ADL Comments: Pt with heavy R lean in static sitting. pt requires (A) to progress to EOB static sitting.   Cognition: Cognition Overall Cognitive Status: No family/caregiver present to determine baseline cognitive functioning Arousal/Alertness:  Awake/alert Orientation Level: Oriented X4 Attention: Sustained Sustained Attention: Impaired Sustained Attention Impairment: Verbal basic, Functional basic Memory:  (appears fuctional informally) Awareness: Impaired Awareness Impairment: Emergent impairment, Anticipatory impairment Problem Solving:  (TBA further) Behaviors:  (borderline labile) Safety/Judgment: Appears intact Cognition Arousal/Alertness: Awake/alert Behavior During Therapy: WFL for tasks assessed/performed Overall Cognitive Status: No family/caregiver present to determine baseline cognitive functioning Area of Impairment: Attention, Following commands Current Attention Level: Selective Following Commands: Follows one step commands consistently General Comments: Pt talking with PMV on during session. pt exiting bed on L side supine to sit. Pt at EOB with hoyer lift placed and sky lifted to chair. Pt tolerated transfer well.    Difficult to assess due to: Impaired communication, Intubated   Blood pressure 126/115, pulse 76, temperature 98.4 F (36.9 C), temperature source Oral, resp. rate 36, height 5' 6"  (1.676 m), weight 116.1 kg (255 lb 15.3 oz), SpO2 96 %. Physical Exam  Nursing note and vitals reviewed. Constitutional: She appears well-developed and well-nourished.  Obese female. Panda in left nares. Up in chair eating lunch--NAD.   HENT:  Head: Normocephalic and atraumatic.  Eyes: Conjunctivae and EOM are normal. Pupils are equal, round, and reactive to light.  Dilated pupils  Neck:  Trach in place with ATC  Cardiovascular: Normal rate and regular rhythm.   Respiratory: Effort normal. No stridor. She has rhonchi. She exhibits no tenderness.  Puffing breaths occasionally. Wet congested sounds.   GI: Soft. Bowel sounds are normal. She exhibits no distension. There is no tenderness.  Neurological: She is alert.  Severe dysarthria with slurring. Right central 7 sign.  Oriented to self, age and situation. Was  able to [facility] but was surprised that she was in [place]-- thought that she was in [place]. She is able to follow simple motor commands. Slow to process.  Reasonable attention. RUE: 3/5 deltoid, biceps, triceps to 3+ distally. RLE: 2+ HF, KE and 3/5 ADF/PF. LUE and LLE grossly 4 to 4+/5. Sl decrease in LT RUE and RLE. DTR's 1+. No resting tone.   Skin: Skin is warm and dry.  Psychiatric: Her affect is blunt. Her speech is delayed and slurred. She is slowed. Cognition and memory are impaired.    Results for orders placed or performed during the hospital encounter of 07/12/15 (from the past 48 hour(s))  CBC     Status: Abnormal   Collection Time: 07/23/15  3:51 AM  Result Value Ref Range   WBC 7.5 4.0 - 10.5 K/uL   RBC 4.68 3.87 - 5.11 MIL/uL   Hemoglobin 10.3 (L) 12.0 - 15.0 g/dL   HCT 34.0 (L) 36.0 - 46.0 %   MCV 72.6 (L) 78.0 - 100.0 fL   MCH 22.0 (L) 26.0 - 34.0 pg   MCHC 30.3 30.0 - 36.0 g/dL   RDW 19.4 (H) 11.5 - 15.5 %   Platelets 330 150 - 400 K/uL  Basic metabolic panel     Status: Abnormal   Collection Time: 07/23/15  3:51 AM  Result Value Ref Range   Sodium 137 135 - 145 mmol/L   Potassium 3.4 (L) 3.5 - 5.1 mmol/L   Chloride 105 101 - 111 mmol/L   CO2 24 22 - 32 mmol/L   Glucose, Bld 109 (H) 65 - 99 mg/dL   BUN 19 6 - 20 mg/dL   Creatinine, Ser 0.75 0.44 - 1.00 mg/dL   Calcium 8.8 (L) 8.9 - 10.3 mg/dL   GFR calc non Af Amer >60 >60 mL/min   GFR calc Af Amer >60 >60 mL/min    Comment: (NOTE) The eGFR has been calculated using the CKD EPI equation. This calculation has not been validated in all clinical situations. eGFR's persistently <60 mL/min signify possible Chronic Kidney Disease.    Anion gap 8 5 - 15  Basic metabolic panel     Status: Abnormal   Collection Time: 07/23/15  7:13 AM  Result Value Ref Range   Sodium 135 135 - 145 mmol/L   Potassium 3.2 (L) 3.5 - 5.1 mmol/L   Chloride 103 101 - 111 mmol/L   CO2 23 22 - 32 mmol/L   Glucose, Bld 116 (H) 65 - 99  mg/dL   BUN 22 (H) 6 - 20 mg/dL   Creatinine, Ser 0.73 0.44 - 1.00 mg/dL   Calcium 8.8 (L) 8.9 - 10.3 mg/dL   GFR calc non Af Amer >60 >60 mL/min   GFR calc Af Amer >60 >60 mL/min    Comment: (NOTE) The eGFR has been calculated using the CKD EPI equation. This calculation has not been validated in all clinical situations. eGFR's persistently <60 mL/min signify possible Chronic Kidney Disease.    Anion gap 9 5 - 15  CBC     Status: Abnormal   Collection Time: 07/24/15  2:26 AM  Result Value Ref Range   WBC 8.7 4.0 - 10.5 K/uL   RBC 4.42 3.87 - 5.11 MIL/uL   Hemoglobin 9.4 (L) 12.0 - 15.0 g/dL   HCT 31.1 (L) 36.0 - 46.0 %  MCV 70.4 (L) 78.0 - 100.0 fL   MCH 21.3 (L) 26.0 - 34.0 pg   MCHC 30.2 30.0 - 36.0 g/dL   RDW 19.3 (H) 11.5 - 15.5 %   Platelets 387 150 - 400 K/uL  Basic metabolic panel     Status: Abnormal   Collection Time: 07/24/15  2:26 AM  Result Value Ref Range   Sodium 135 135 - 145 mmol/L   Potassium 3.8 3.5 - 5.1 mmol/L   Chloride 105 101 - 111 mmol/L   CO2 23 22 - 32 mmol/L   Glucose, Bld 96 65 - 99 mg/dL   BUN 19 6 - 20 mg/dL   Creatinine, Ser 0.73 0.44 - 1.00 mg/dL   Calcium 8.8 (L) 8.9 - 10.3 mg/dL   GFR calc non Af Amer >60 >60 mL/min   GFR calc Af Amer >60 >60 mL/min    Comment: (NOTE) The eGFR has been calculated using the CKD EPI equation. This calculation has not been validated in all clinical situations. eGFR's persistently <60 mL/min signify possible Chronic Kidney Disease.    Anion gap 7 5 - 15  Magnesium     Status: None   Collection Time: 07/24/15  2:26 AM  Result Value Ref Range   Magnesium 1.8 1.7 - 2.4 mg/dL  Phosphorus     Status: None   Collection Time: 07/24/15  2:26 AM  Result Value Ref Range   Phosphorus 3.2 2.5 - 4.6 mg/dL   Dg Chest Port 1 View  07/23/2015  CLINICAL DATA:  Productive cough. EXAM: PORTABLE CHEST 1 VIEW COMPARISON:  July 21, 2015. FINDINGS: Stable cardiomediastinal silhouette. Tracheostomy tube in grossly good  position. No pneumothorax or pleural effusion is noted. Minimal bibasilar subsegmental atelectasis is noted. Both lungs are clear. The visualized skeletal structures are unremarkable. IMPRESSION: Minimal bibasilar subsegmental atelectasis. Electronically Signed   By: Marijo Conception, M.D.   On: 07/23/2015 07:45       Medical Problem List and Plan: 1.  Functional deficits, right hemiparesis and general debility secondary to hypertensive left parietal and pontine hemorrhages with subsequent complications 2.  DVT Prophylaxis/Anticoagulation: Pharmaceutical: Lovenox 3. Pain Management:  Will try low dose ultram prn  4. Mood: team to provide ego support. LCSW to follow for evaluation.  5. Neuropsych: This patient is not capable of making decisions on her own behalf. 6. Skin/Wound Care:  Continue rectal tube for now. Routine pressure relief measures. Maintain adequate nutrition and hydration.  7. Fluids/Electrolytes/Nutrition: Monitor I/O on current diet.  Check lytes in am. 8. VDRF with trach: Continue CFS #6 with ATC. PMSV during waking hours.  9. HTN: Continue norvasc, lisinopril, apresoline, metoprolol and HCTZ.  Monitor BP every 6 hours and titrate medications as indicated.  10 Hypokalemia: Likely due to diuretics, diarrhea and IVF. Discontinue IVF and increase oral supplement. Recheck lytes in am.  11. ABLA: Monitor for signs of bleeding. Check stool guaiacs.  12. Dysphagia: Monitor tolerance of dysphagia 2, nectar liquids. Needs full supervision at meals. Discontinue IVF.  13. Abnormal LFT:  Discontinue tylenol. 14. Anxiety: Monitor on buspar bid.  15. Diarrhea: Should improve off tube feeds. Completes antibiotics today.  16. Incontinence: Foley d/c today and patient unaware. Will check UA/UCS in am.  Toilet when awake.       Post Admission Physician Evaluation: 1. Functional deficits secondary  to left pontine/parietal hemorrhages and multiple medical complications. 2. Patient is  admitted to receive collaborative, interdisciplinary care between the physiatrist, rehab nursing staff, and  therapy team. 3. Patient's level of medical complexity and substantial therapy needs in context of that medical necessity cannot be provided at a lesser intensity of care such as a SNF. 4. Patient has experienced substantial functional loss from his/her baseline which was documented above under the "Functional History" and "Functional Status" headings.  Judging by the patient's diagnosis, physical exam, and functional history, the patient has potential for functional progress which will result in measurable gains while on inpatient rehab.  These gains will be of substantial and practical use upon discharge  in facilitating mobility and self-care at the household level. 5. Physiatrist will provide 24 hour management of medical needs as well as oversight of the therapy plan/treatment and provide guidance as appropriate regarding the interaction of the two. 6. 24 hour rehab nursing will assist with bladder management, bowel management, safety, skin/wound care, disease management, medication administration and patient education  and help integrate therapy concepts, techniques,education, etc. 7. PT will assess and treat for/with: Lower extremity strength, range of motion, stamina, balance, functional mobility, safety, adaptive techniques and equipment, NMR, cognitive perceptual awareness, education.   Goals are: supervision to min assist. 8. OT will assess and treat for/with: ADL's, functional mobility, safety, upper extremity strength, adaptive techniques and equipment, NMR, cognitive perceptual awareness, community reintegration.   Goals are: supervision to min assist. Therapy may not yet proceed with showering this patient. 9. SLP will assess and treat for/with: cognition, swallowing, speech.  Goals are: mod I to supervision. 10. Case Management and Social Worker will assess and treat for psychological  issues and discharge planning. 11. Team conference will be held weekly to assess progress toward goals and to determine barriers to discharge. 12. Patient will receive at least 3 hours of therapy per day at least 5 days per week. 13. ELOS: 20-25 days       14. Prognosis:  excellent     Meredith Staggers, MD, [facility] 07/24/2015   07/24/2015

## 2015-07-24 NOTE — PMR Pre-admission (Signed)
PMR Admission Coordinator Pre-Admission Assessment  [name] is an 50 y.o., female MRN: [id number] DOB: 06-17-1965 Height: 5' 6"  (167.6 cm) Weight: 116.1 kg (255 lb 15.3 oz)              Insurance Information  PRIMARY: uninsured      Medicaid Application Date:       Case Manager:  Disability Application Date:       Case Worker: 07/20/15 Wendall Stade at [phone] with financial counseling has initiated retro and ongoing MAD with [facility] and disability applications  Emergency [place]    Name Relation Home Work [place] Spouse   [phone]   [place]rica, [place]   [phone]     Current Medical History  Patient Admitting Diagnosis: left parietal and left pontine hemorrhage  History of Present Illness: Gwendolyn Morales is a 50 y.o. female with history of morbid obesity, HTN and medical non-compliance who was admitted on 07/12/15 a fall at home with subsequent right sided weakness and speech difficulty. History taken from chart review. BP at admission markedly elevated at 280/150 and she was started on cardene drip for control. CT head done showing acute hemorrhage 1.7 cm X 1.3 cm at left parietal lobe, small focus of acute hemorrhage left pons and scattered small vessel microangiopathy. She was noted to have intermittent hypoxia with sonorous respirations and was intubated for airway protection. CTA head/neck showed stable hemorrhage, no stenosis, AVM or aneurysm, and advanced dolichoectasia. 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 Dr. 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 To consider LP or cerebral angio if indicated.   Patient continued to have copious oral secretions with UE edema. She was started on  IV Vanc/Zosyn for treatment of aspiration PNA. Blood pressure difficulty to control and renal ultrasound done revealing minimal flow in right RA with smaller right kidney. 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 Blood pressures improving therefore they signed off 06/09. 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 She was weaned to ATC on 06/10 and vasculitis panel with elevated ESR, CRP and RF otherwise negative. She continues to have issues with anxiety requiring propofol and was transitioned to busparl. She has had diarrhea, epigastric pain with jaundice and was found to have elevated bilirubin of 4.1. Stool on 06/10 was negative fo C- diff and rectal tube placed to help with management. PCCM recommends avoid nephrotoxic medications and to continue zosyn thorough 06/13. PMSV trials initiated yesterday and patient downsized to cuff less # 6 trach. She was started on dysphagia 2, nectar liquids today due to facial weakness and weak cough. Therapy has been working with patient and she is able to tolerate sitting at EOB as well as phonate with PMSV. CIR recommend for follow up therapy.   Total: 10 NIH    Past Medical History  Past Medical History  Diagnosis Date  . Hypertension     Family History  family history is not on file.  Prior Rehab/Hospitalizations:  Has the patient had major surgery during 100 days prior to admission? No  Current Medications   Current facility-administered medications:  .  0.9 %  sodium chloride  infusion, , Intravenous, Continuous, Collene Gobble, MD, Stopped at 07/22/15 0400 .  0.9 % NaCl with KCl 20 mEq/ L  infusion, , Intravenous, Continuous, Catha Gosselin, MD, Last Rate: 75 mL/hr at 07/24/15 0700 .  acetaminophen (TYLENOL) solution 650 mg, 650 mg, Per Tube, Q4H PRN, Rosalin Hawking, MD, 650 mg at 07/22/15 0300 .  acetaminophen (TYLENOL) tablet 650 mg, 650 mg, Oral, Q4H PRN, 650 mg at 07/14/15 0954 **OR** acetaminophen (TYLENOL) suppository 650 mg, 650 mg, Rectal, Q4H PRN, Wallie Char, 650 mg at 07/19/15 0329 .  amLODipine (NORVASC) tablet 10 mg, 10 mg, Per Tube, Daily, Garvin Fila, MD, 10 mg at 07/24/15 1002 .  antiseptic oral rinse (CPC / CETYLPYRIDINIUM CHLORIDE 0.05%) solution 7 mL, 7 mL, Mouth Rinse, q12n4p, Rosalin Hawking, MD, 7 mL at 07/23/15 1600 .  busPIRone (BUSPAR) tablet 7.5 mg, 7.5 mg, Oral, BID, David L Rinehuls, PA-C, 7.5 mg at 07/24/15 1000 .  chlorhexidine (PERIDEX) 0.12 % solution 15 mL, 15 mL, Mouth Rinse, BID, Rosalin Hawking, MD, 15 mL at 07/24/15 1000 .  clevidipine (CLEVIPREX) infusion 0.5 mg/mL, 0-21 mg/hr, Intravenous, Continuous, Rosalin Hawking, MD, Stopped at 07/21/15 0115 .  enoxaparin (LOVENOX) injection 40 mg, 40 mg, Subcutaneous, Q24H, Garvin Fila, MD, 40 mg at 07/24/15 1347 .  feeding supplement (VITAL HIGH PROTEIN) liquid 1,000 mL, 1,000 mL, Per Tube, Continuous, Rosalin Hawking, MD, Last Rate: 60 mL/hr at 07/24/15 0700, 1,000 mL at 07/24/15 0700 .  free water 400 mL, 400 mL, Per Tube, Q4H, Elmarie Shiley, MD, 400 mL at 07/24/15 0800 .  hydrALAZINE (APRESOLINE) tablet 50 mg, 50 mg, Oral, Q8H, Rosalin Hawking, MD, 50 mg at 07/24/15 0602 .  hydrochlorothiazide 10 mg/mL oral suspension 25 mg, 25 mg, Per Tube, Daily, Garvin Fila, MD, 25 mg at 07/24/15 1000 .  lisinopril (PRINIVIL,ZESTRIL) tablet 20 mg, 20 mg, Oral, BID, Rosalin Hawking, MD, 20 mg at 07/24/15 1003 .  loperamide (IMODIUM) 1 MG/5ML solution 2 mg, 2 mg, Per Tube, PRN, Rush Farmer, MD, 2 mg at 07/24/15 0128 .  LORazepam (ATIVAN) injection 1-2 mg, 1-2 mg, Intravenous, Q4H PRN, Rush Farmer, MD .  metoprolol tartrate (LOPRESSOR) 25 mg/10 mL oral suspension 100 mg, 100 mg, Per Tube, BID, Donzetta Starch, NP, 100 mg at 07/24/15 1002 .  morphine 2 MG/ML injection 1 mg, 1 mg, Intravenous, Q6H PRN, Catha Gosselin, MD, 1 mg at 07/22/15 1143 .  pantoprazole sodium (PROTONIX) 40 mg/20 mL oral suspension 40 mg, 40 mg, Per Tube, Q24H, Chesley Mires, MD, 40 mg at 07/23/15 1731 .  piperacillin-tazobactam (ZOSYN) IVPB 3.375 g, 3.375 g, Intravenous, Q8H, Garvin Fila, MD, 3.375 g at 07/24/15 1001 .  potassium chloride (KLOR-CON) packet 20 mEq, 20 mEq, Oral, BID, Catha Gosselin, MD, 20 mEq at 07/24/15 1000 .  RESOURCE THICKENUP CLEAR, , Oral, PRN, Garvin Fila, MD  Patients Current Diet: DIET DYS 2 Room service appropriate?: Yes; Fluid consistency:: Nectar Thick cortrak remains in place  Precautions / Restrictions Precautions Precautions: Fall Restrictions Weight Bearing Restrictions: No   Has the patient had 2 or more falls or a fall with injury in the past year?No  Prior Activity Level Community (5-7x/wk): Independent and driving pta . Completely independent pta.  Home Assistive Devices / Equipment Home Assistive Devices/Equipment: None Home Equipment: None  Prior Device Use: Indicate devices/aids used by the patient prior to current illness, exacerbation or injury? None of the above  Prior Functional Level  Prior Function Level of Independence: Independent  Self Care: Did the patient need help bathing, dressing, using the toilet or eating?  Independent  Indoor Mobility: Did the patient need assistance with walking from room to room (with or without device)? Independent  Stairs: Did the patient need assistance with internal or external stairs (with or without device)? Independent  Functional Cognition: Did the patient need help planning regular tasks such as shopping or remembering to take medications? Independent  Current Functional Level Cognition  Arousal/Alertness: Awake/alert Overall Cognitive Status: No family/caregiver present to determine baseline cognitive functioning Difficult to assess due to: Impaired communication, Intubated Current Attention Level:  Selective Orientation Level: Oriented X4 Following Commands: Follows one step commands consistently General Comments: Pt talking with PMV on during session. pt exiting bed on L side supine to sit. Pt at EOB with hoyer lift placed and sky lifted to chair. Pt tolerated transfer well.    Attention: Sustained Sustained Attention: Impaired Sustained Attention Impairment: Verbal basic, Functional basic Memory:  (appears fuctional informally) Awareness: Impaired Awareness Impairment: Emergent impairment, Anticipatory impairment Problem Solving:  (TBA further) Behaviors:  (borderline labile) Safety/Judgment: Appears intact    Extremity Assessment (includes Sensation/Coordination)  Upper Extremity Assessment: RUE deficits/detail, LUE deficits/detail RUE Deficits / Details: edematous throughout, 1+ movement RUE Coordination: decreased fine motor, decreased gross motor LUE Deficits / Details: edematous throughout, 2 movement througout LUE Coordination: decreased fine motor, decreased gross motor  Lower Extremity Assessment: Generalized weakness (limited AROM and strength LLE) RLE Deficits / Details: no noted movement throught session on RLE RLE Coordination: decreased fine motor, decreased gross motor    ADLs  Overall ADL's : Needs assistance/impaired Eating/Feeding: NPO Grooming: Oral care, Minimal assistance Grooming Details (indicate cue type and reason): Hand over hand assist to complete task but pt able to initiate with washcloth in L hand. Upper Body Bathing: Minimal assitance Lower Body Bathing: Maximal assistance Lower Body Bathing Details (indicate cue type and reason): Total assist to clean pt after BM. General ADL Comments: Pt with heavy R lean in static sitting. pt requires (A) to progress to EOB static sitting.     Mobility  Overal bed mobility: Needs Assistance, +2 for physical assistance, + 2 for safety/equipment Bed Mobility: Rolling, Supine to Sit Rolling: +2 for physical  assistance, Mod assist Supine to sit: +2 for physical assistance Sit to supine: Total assist, +2 for physical assistance, +2 for safety/equipment General bed mobility comments: Max assist via helicopter technique to come to EOB. Patient with some LE movement to EOB with multi-modal cues. Max assist of 2 persons for elevation of trunk to upright position with posterior support at EOB. Total assist to return to supine.    Transfers  General transfer comment: not assessed this session due to lateral lean    Ambulation / Gait / Stairs / Wheelchair Mobility  Ambulation/Gait General Gait Details: not assessed today    Posture / Balance Dynamic Sitting Balance Sitting balance - Comments: patient able to tolerate static sitting for periods of ~20-30 seconds without assist Balance Overall balance assessment: Needs assistance Sitting-balance support: Single extremity supported, Feet supported Sitting balance-Leahy Scale: Poor Sitting balance - Comments: patient able to tolerate static sitting for periods of ~20-30 seconds without assist    Special needs/care consideration Oxygen 28 % trach collar Special Bed overlay mattress Trach Size  6 cuffless trach 07/23/15.  Bowel mgmt: rectal tube due to diarhea Bladder mgmt:indwelling catheter removed 6/13   Previous Home Environment Living Arrangements:  (76 yo son, Kathlyn [place] lives at home. Older 43 yo son not in the h)  Lives With: Spouse, Son Available Help at Discharge: Available 24 hours/day Type of Home: House Home Layout: One level Home Access:  (thorugh the garage, 3 steps other entrance) Bathroom Shower/Tub: Chiropodist: Standard Bathroom Accessibility: Yes How Accessible: Accessible via walker [place]: No  Discharge Living Setting Plans for Discharge Living Setting: Patient's home, Lives with (comment) (spouse and 63 yo son, Kathlyn [place]) Type of Home at Discharge: House Discharge Home  Layout: One level Discharge Home Access: Level entry (entry through garage) Discharge Bathroom Shower/Tub: Tub/shower unit, Curtain Discharge Bathroom Toilet: Standard Discharge Bathroom Accessibility: Yes How Accessible: Accessible via walker Does the patient have any problems obtaining your medications?: Yes (Describe) (pt is uninsured)  Social/Family/Support Systems Patient Roles: Spouse, Parent Contact Information: Ilse Billman, spouse and 29 yo son, Kathlyn [place] Anticipated Caregiver: spouse, son, and family friends Anticipated Caregiver's Contact Information: see above Ability/Limitations of Caregiver: spouse workks days. Son unemployed.  Caregiver Availability: 24/7 Discharge Plan Discussed with Primary Caregiver: Yes Is Caregiver In Agreement with Plan?: Yes Does Caregiver/Family have Issues with Lodging/Transportation while Pt is in Rehab?: No Spouse works days as apartment maintenance. He is a large man 6'3 and 325 lbs. He states he has experience in the caregiver role as he assisted with the care of his own parents who were both amputees. He is concerned that his 43 yo son, Kathlyn [place] could not assist with toileting due to her own modesty. He plans to asks family friends and church members to assist. Spouse can work half days with advanced notice, to come in for family training.  Goals/Additional Needs Patient/Family Goal for Rehab: MOd assist with PT and OT, supervision to min assist with SLP Expected length of stay: ELOS 20-24 days Equipment Needs: #6 cuffless trach; rectal tube Pt/Family Agrees to Admission and willing to participate: Yes Program Orientation Provided & Reviewed with Pt/Caregiver Including Roles  & Responsibilities: Yes  Decrease burden of Care through IP rehab admission: n/a  Possible need for SNF placement upon discharge:not anticipated, but spouse has discussed openly the possibility if pt fails to progress.Not his preference.  Patient Condition: This patient's  condition remains as documented in the consult dated 07/23/2015, in which the Rehabilitation Physician determined and documented that the patient's condition is appropriate for intensive rehabilitative care in an inpatient rehabilitation facility. Will admit to inpatient rehab today.  Preadmission Screen Completed By:  Cleatrice Burke, 07/24/2015 1:49 PM ______________________________________________________________________   Discussed status with Dr. Naaman Plummer on 07/24/2015 at 1349 and received telephone approval for admission today.  Admission Coordinator:  Cleatrice Burke, time 6825 Date 07/24/2015

## 2015-07-25 ENCOUNTER — Inpatient Hospital Stay (HOSPITAL_COMMUNITY): Payer: Self-pay | Admitting: Speech Pathology

## 2015-07-25 ENCOUNTER — Inpatient Hospital Stay (HOSPITAL_COMMUNITY): Payer: Self-pay | Admitting: Occupational Therapy

## 2015-07-25 ENCOUNTER — Inpatient Hospital Stay (HOSPITAL_COMMUNITY): Payer: Self-pay

## 2015-07-25 LAB — COMPREHENSIVE METABOLIC PANEL
ALBUMIN: 2.4 g/dL — AB (ref 3.5–5.0)
ALT: 162 U/L — ABNORMAL HIGH (ref 14–54)
ANION GAP: 7 (ref 5–15)
AST: 80 U/L — AB (ref 15–41)
Alkaline Phosphatase: 383 U/L — ABNORMAL HIGH (ref 38–126)
BILIRUBIN TOTAL: 2.5 mg/dL — AB (ref 0.3–1.2)
BUN: 15 mg/dL (ref 6–20)
CHLORIDE: 103 mmol/L (ref 101–111)
CO2: 26 mmol/L (ref 22–32)
Calcium: 9.2 mg/dL (ref 8.9–10.3)
Creatinine, Ser: 0.76 mg/dL (ref 0.44–1.00)
GFR calc Af Amer: >60 mL/min (ref >60)
GFR calc non Af Amer: >60 mL/min (ref >60)
GLUCOSE: 94 mg/dL (ref 65–99)
POTASSIUM: 3.8 mmol/L (ref 3.5–5.1)
SODIUM: 136 mmol/L (ref 135–145)
TOTAL PROTEIN: 6.5 g/dL (ref 6.5–8.1)

## 2015-07-25 LAB — CBC WITH DIFFERENTIAL/PLATELET
BASOS PCT: 0 %
Basophils Absolute: 0 10*3/uL (ref 0.0–0.1)
EOS ABS: 0.5 10*3/uL (ref 0.0–0.7)
Eosinophils Relative: 6 %
HCT: 31.3 % — ABNORMAL LOW (ref 36.0–46.0)
Hemoglobin: 9.4 g/dL — ABNORMAL LOW (ref 12.0–15.0)
Lymphocytes Relative: 10 %
Lymphs Abs: 0.9 10*3/uL (ref 0.7–4.0)
MCH: 21.1 pg — AB (ref 26.0–34.0)
MCHC: 30 g/dL (ref 30.0–36.0)
MCV: 70.3 fL — ABNORMAL LOW (ref 78.0–100.0)
MONO ABS: 1 10*3/uL (ref 0.1–1.0)
Monocytes Relative: 11 %
NEUTROS PCT: 73 %
Neutro Abs: 6.6 10*3/uL (ref 1.7–7.7)
PLATELETS: 412 10*3/uL — AB (ref 150–400)
RBC: 4.45 MIL/uL (ref 3.87–5.11)
RDW: 19 % — AB (ref 11.5–15.5)
WBC: 9 10*3/uL (ref 4.0–10.5)

## 2015-07-25 LAB — MAGNESIUM: Magnesium: 1.8 mg/dL (ref 1.7–2.4)

## 2015-07-25 LAB — PHOSPHORUS: Phosphorus: 4.2 mg/dL (ref 2.5–4.6)

## 2015-07-25 MED ORDER — HYDROCHLOROTHIAZIDE 25 MG PO TABS
25.0000 mg | ORAL_TABLET | Freq: Every day | ORAL | Status: DC
  Administered 2015-07-25 – 2015-08-07 (×14): 25 mg via ORAL
  Filled 2015-07-25 (×14): qty 1

## 2015-07-25 MED ORDER — METOPROLOL TARTRATE 50 MG PO TABS
100.0000 mg | ORAL_TABLET | Freq: Two times a day (BID) | ORAL | Status: DC
  Administered 2015-07-25 – 2015-08-17 (×47): 100 mg via ORAL
  Filled 2015-07-25 (×48): qty 2

## 2015-07-25 NOTE — Progress Notes (Signed)
50 y.o. female with history of morbid obesity, HTN and medical non-compliance who was admitted on 07/12/15 a fall at home with subsequent right sided weakness and speech difficulty. History taken from chart review. BP at admission markedly elevated at 280/150 and she was started on cardene drip for control. CT head done showing acute hemorrhage 1.7 cm X 1.3 cm at left parietal lobe, small focus of acute hemorrhage left pons and scattered small vessel microangiopathy. She was noted to have intermittent hypoxia with sonorous respirations and was intubated for airway protection. CTA head/neck showed stable hemorrhage, no stenosis, AVM or aneurysm, and advanced dolichoectasia. 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 Dr. 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 To consider LP or cerebral angio if indicated.   Patient continues to have copious oral secretions with UE edema. She was started on IV Vanc/Zosyn for treatment of aspiration PNA. Blood pressure difficulty to control and renal ultrasound done revealing minimal flow in right RA with smaller right kidney. 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 Blood pressures improving therefore they signed off 06/09. 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 She was weaned to ATC on 06/10 and vasculitis panel with elevated ESR, CRP and RF otherwise negative. She continues to have issues with anxiety requiring propofol and was transitioned to busparl. She has had diarrhea, epigastric pain with jaundice and was found to have elevated bilirubin of 4.1. Stool on 06/10 was  negative fo C- diff and rectal tube placed to help with management. PCCM recommends avoid nephrotoxic medications and to continue zosyn thorough 06/13. PMSV trials initiated yesterday and patient downsized to cuff less # 6 trach. She was started on dysphagia 2, nectar liquids today due to facial weakness and weak cough  Subjective/Complaints: No issues overnight, working with OT. Tends to lean towards the right side. Discussed patient's bowel issues with R and  Review of systems negative for shortness of breath, chest pain, nausea vomiting. Positive for diarrhea  Objective: Vital Signs: Blood pressure 140/88, pulse 85, temperature 98.7 F (37.1 C), temperature source Oral, resp. rate 20, weight 115.8 kg (255 lb 4.7 oz), SpO2 99 %. No results found. Results for orders placed or performed during the hospital encounter of 07/24/15 (from the past 72 hour(s))  CBC WITH DIFFERENTIAL     Status: Abnormal   Collection Time: 07/25/15  5:08 AM  Result Value Ref Range   WBC 9.0 4.0 - 10.5 K/uL   RBC 4.45 3.87 - 5.11 MIL/uL   Hemoglobin 9.4 (L) 12.0 - 15.0 g/dL   HCT 31.3 (L) 36.0 - 46.0 %   MCV 70.3 (L) 78.0 - 100.0 fL   MCH 21.1 (L) 26.0 - 34.0 pg   MCHC 30.0 30.0 - 36.0 g/dL   RDW 19.0 (H) 11.5 - 15.5 %   Platelets 412 (H) 150 - 400 K/uL   Neutrophils Relative % 73 %   Lymphocytes Relative 10 %   Monocytes Relative 11 %   Eosinophils Relative 6 %   Basophils Relative 0 %   Neutro Abs 6.6 1.7 - 7.7 K/uL   Lymphs Abs 0.9 0.7 - 4.0 K/uL   Monocytes Absolute 1.0 0.1 -  1.0 K/uL   Eosinophils Absolute 0.5 0.0 - 0.7 K/uL   Basophils Absolute 0.0 0.0 - 0.1 K/uL   RBC Morphology POLYCHROMASIA PRESENT   Comprehensive metabolic panel     Status: Abnormal   Collection Time: 07/25/15  5:08 AM  Result Value Ref Range   Sodium 136 135 - 145 mmol/L   Potassium 3.8 3.5 - 5.1 mmol/L   Chloride 103 101 - 111 mmol/L   CO2 26 22 - 32 mmol/L   Glucose, Bld 94 65 - 99 mg/dL   BUN 15 6 - 20 mg/dL    Creatinine, Ser 0.76 0.44 - 1.00 mg/dL   Calcium 9.2 8.9 - 10.3 mg/dL   Total Protein 6.5 6.5 - 8.1 g/dL   Albumin 2.4 (L) 3.5 - 5.0 g/dL   AST 80 (H) 15 - 41 U/L   ALT 162 (H) 14 - 54 U/L   Alkaline Phosphatase 383 (H) 38 - 126 U/L   Total Bilirubin 2.5 (H) 0.3 - 1.2 mg/dL   GFR calc non Af Amer >60 >60 mL/min   GFR calc Af Amer >60 >60 mL/min    Comment: (NOTE) The eGFR has been calculated using the CKD EPI equation. This calculation has not been validated in all clinical situations. eGFR's persistently <60 mL/min signify possible Chronic Kidney Disease.    Anion gap 7 5 - 15  Magnesium     Status: None   Collection Time: 07/25/15  5:08 AM  Result Value Ref Range   Magnesium 1.8 1.7 - 2.4 mg/dL  Phosphorus     Status: None   Collection Time: 07/25/15  5:08 AM  Result Value Ref Range   Phosphorus 4.2 2.5 - 4.6 mg/dL     HEENT: #6 Shiley, well-placed, no surrounding erythema or drainage Cardio: RRR and no murmurs or extra sounds Resp: CTA B/L and unlabored GI: BS positive and non-distended nontender Extremity:  Pulses positive and No Edema Skin:   Other ttrach site CDI, IV site left arm CDI Neuro: Alert/Oriented, Cranial Nerve II-XII normal, Normal Sensory, Abnormal Motor /5 bilateral deltoid, biceps, triceps, grip, hip flexor, knee extensor, ankle dorsiflexor and plantar flexor and Abnormal FMC Ataxic/ dec FMC Musc/Skel:  Normal Gen. no acute distress   Assessment/Plan: 1. 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 Physiatrist is providing close team supervision and 24 hour management of active medical problems listed below. Physiatrist and rehab team continue to assess barriers to discharge/monitor patient progress toward functional and medical goals. FIM:                   Function -  Comprehension Comprehension: Auditory Comprehension assist level: Understands basic 75 - 89% of the time/ requires cueing 10 - 24% of the time  Function - Expression Expression: Verbal (trach, passy muir valve) Expression assist level: Expresses basic 75 - 89% of the time/requires cueing 10 - 24% of the time. Needs helper to occlude trach/needs to repeat words.  Function - Social Interaction Social Interaction assist level: Interacts appropriately 90% of the time - Needs monitoring or encouragement for participation or interaction.  Function - Problem Solving Problem solving assist level: Solves basic 75 - 89% of the time/requires cueing 10 - 24% of the time  Function - Memory Memory assist level: Recognizes or recalls 75 - 89% of the time/requires cueing 10 - 24% of the time  Patient normally able to recall (first 3 days only): Staff names and faces, That he or she is in a hospital, Current season   Medical Problem List and Plan: 1.  Functional deficits, right hemiparesis and general debility secondary to hypertensive left parietal and pontine hemorrhages with subsequent complications, initiate rehabilitation today 2.  DVT Prophylaxis/Anticoagulation: Pharmaceutical: Lovenox 3. Pain Management:  Will try low dose ultram prn   4. Mood: team to provide ego support. LCSW to follow for evaluation.   5. Neuropsych: This patient is not capable of making decisions on her own behalf. 6. Skin/Wound Care:  Continue rectal tube for now. Routine pressure relief measures. Maintain adequate nutrition and hydration.   7. Fluids/Electrolytes/Nutrition: Monitor I/O on current diet.  Check lytes in am. 8. VDRF with trach: Continue CFS #6 with ATC. PMSV during waking hours.  wean with pulmonary input 9. HTN: Continue norvasc, lisinopril, apresoline, metoprolol and HCTZ.  Monitor BP every 6 hours and titrate medications as indicated.  10 Hypokalemia: Likely due to diuretics, diarrhea and IVF. Discontinue  IVF and increase oral supplement. Recheck lytes in am.   11. ABLA: Monitor for signs of bleeding. Check stool guaiacs.   12. Dysphagia: Monitor tolerance of dysphagia 2, nectar liquids. Needs full supervision at meals. Discontinue IVF.   13. Abnormal LFT:  Discontinue tylenol. 14. Anxiety: Monitor on buspar bid.  15. Diarrhea: Should improve off tube feeds. Completes antibiotics today.  Will discontinue rectal tube 16. Incontinence: Foley d/c today and patient unaware. Will check UA/UCS in am.  Toilet when awake.   LOS (Days) 1 A FACE TO FACE EVALUATION WAS PERFORMED  KIRSTEINS,ANDREW E 07/25/2015, 9:11 AM

## 2015-07-25 NOTE — Evaluation (Addendum)
Speech Language Pathology Assessment and Plan  Patient Details  Name: Gwendolyn Morales MRN: [id number] Date of Birth: 1965-10-26  SLP Diagnosis: Dysarthria;Cognitive Impairments;Speech and Language deficits;Dysphagia;Voice disorder  Rehab Potential: Good ELOS: 21-28 days     Today's Date: 07/25/2015 SLP Individual Time: 0905-1000 SLP Individual Time Calculation (min): 55 min   Problem List:  Patient Active Problem List   Diagnosis Date Noted  . Cerebral parenchymal hemorrhage ([place]) 07/24/2015  . 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 Elevated bilirubin   . Adjustment disorder with mixed anxiety and depressed mood   . Diastolic dysfunction   . Morbid obesity due to excess calories ([place])   . H/O noncompliance with medical treatment, presenting hazards to health   . Tachypnea   . Acute blood loss anemia   . Dysphagia, post-stroke   . HTN (hypertension)   . Productive cough   . Tracheostomy status ([place])   . Nontraumatic cortical hemorrhage of left cerebral hemisphere ([place])   . Hypokalemia   . Azotemia   . Acute respiratory failure ([place])   . Acute respiratory failure with hypoxemia ([place])   . Cytotoxic brain edema ([place]) 07/13/2015  . 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 ICH (intracerebral hemorrhage) ([place]) 07/12/2015   Past Medical History:  Past Medical History  Diagnosis Date  . Hypertension    Past Surgical History: No past surgical history on file.  Assessment / Plan / Recommendation Clinical Impression   Gwendolyn Morales is a 50 y.o. female with history of morbid obesity, HTN and medical non-compliance who was admitted on 07/12/15 after a fall at home with subsequent right sided weakness and speech difficulty. History taken from chart review. BP at admission markedly elevated at 280/150. CT head done showing acute hemorrhage 1.7 cm X 1.3 cm at left parietal lobe, small focus of acute hemorrhage left pons and scattered small vessel  microangiopathy. She was noted to have intermittent hypoxia with sonorous respirations and was intubated for airway protection. 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 Patient downsized to cuff less # 6 trach. She was started on dysphagia 2, nectar liquids  due to facial weakness and weak cough. Therapy has been working with patient and she is able to tolerate sitting at EOB as well as phonate with PMSV. Pt admitted to CIR on 07/24/2015.  SLP evaluation was completed on 07/25/2015 with the following results:  Pt presents with s/s of a mild-moderate oropharyngeal dysphagia characterized by slightly prolonged oral transit of boluses, suspected delayed swallow initiation, and multiple swallows with thin liquids, purees, and nectar thick liquids.  No coughing noted with nectars or thins during today's assessment despite being challenged with large consecutive boluses.  Pt would benefit from objective swallow assessment to determine safest diet consistencies and readiness for advancement to thin liquids.  Pt presents with excellent toleration of speaking valve for the duration of today's therapy session.  Pt was wearing valve upon therapist's arrival.  Vocal quality was slightly wet from secretions but cleared following deep suctioning from respiratory therapist.  With valve in place pt able to achieve upper airway patency but demonstrated hoarse, low intensity vocal quality.  That in combination with rapid rate of speech, imprecise articulation of consonants, and poor breath support for speech impaired intelligibility at the phrase/sentence level.  Pt also presents with mild expressive language deficits characterized by perseveration and groping  for words.  Pt appears aware of errors but requires cues to correct errors in the moment.    Cognition not formally assessed on this date; however, pt informally presents with mildly decreased sustained attention to tasks, decreased storage and retrieval of information, and limited emergent awareness of deficits.  Pt reports feeling that it is "harder to focus" s/p CVA.   Pt was independent prior to admission and has experienced a loss in function as a result of the abovementioned deficits.  Therefore, pt would benefit from skilled ST while inpatient in order to maximize functional independence and reduce burden of care prior to discharge.  Anticipate that pt will need 24/7 supervision at discharge in addition to follow up ST services at next level of care.     Skilled Therapeutic Interventions          Passy-Muir speaking valve, speech,  and bedside swallow evaluation completed with results and recommendations reviewed with patient .  Pt with severely decreased intelligibility at the sentence level during conversational exchanges with therapist.  Pt was stimulable for pacing of speech and chunking sentences into shorter phrases to facilitate slowed rate and improved breath support for speech with min-mod assist verbal cues which in turn improved her intelligibility and overall functional communication.     SLP Assessment  Patient will need skilled Speech Lanaguage Pathology Services during CIR admission    Recommendations  Patient may use Passy-Muir Speech Valve: During all waking hours (remove during sleep) PMSV Supervision: Intermittent SLP Diet Recommendations: Dysphagia 2 (Fine chop);Nectar Liquid Administration via: Cup;No straw Medication Administration: Whole meds with puree Supervision: Full supervision/cueing for compensatory strategies Compensations: Slow rate;Small sips/bites;Lingual sweep for clearance of pocketing Postural Changes and/or Swallow Maneuvers: Seated upright 90 degrees Oral Care Recommendations: Oral care BID Patient destination: Home Follow up Recommendations:  24 hour supervision/assistance;Home Health SLP;Outpatient SLP Equipment Recommended: To be determined    SLP Frequency 3 to 5 out of 7 days   SLP Duration  SLP Intensity  SLP Treatment/Interventions 21-28 days   Minumum of 1-2 x/day, 30 to 90 minutes  Cognitive remediation/compensation;Cueing hierarchy;Dysphagia/aspiration precaution training;Internal/external aids;Patient/family education;Functional tasks    Pain Pain Assessment Pain Assessment: No/denies pain  Prior Functioning Cognitive/Linguistic Baseline: Within functional limits Type of Home: House  Lives With: Spouse;Son Available Help at Discharge: Available 24 hours/day Education: high school  Vocation: Unemployed  Function:  Eating Eating   Modified Consistency Diet: Yes Eating Assist Level: Supervision or verbal cues           Cognition Comprehension Comprehension assist level: Understands basic 75 - 89% of the time/ requires cueing 10 - 24% of the time  Expression   Expression assist level: Expresses basic 50 - 74% of the time/requires cueing 25 - 49% of the time. Needs to repeat parts of sentences.  Social Interaction Social Interaction assist level: Interacts appropriately 90% of the time - Needs monitoring or encouragement for participation or interaction.  Problem Solving Problem solving assist level: Solves basic 75 - 89% of the time/requires cueing 10 - 24% of the time  Memory Memory assist level: Recognizes or recalls 75 - 89% of the time/requires cueing 10 - 24% of the time   Short Term Goals: Week 1: SLP Short Term Goal 1 (Week 1): Pt will consume therapeutic trials of thin liquids with supervision cues for use of swallowing precautions and minimal overt s/s of aspiration  SLP Short Term Goal 2 (Week 1): Pt will consume dys 2 textures and nectar thick  liquids with supervision cues for use of swallowing precautions and minimal overt s/s of aspiration  SLP Short Term Goal 3 (Week 1): Pt will be  intelligible at the sentence level in 75% of opportunities with min assist verbal cues for pacing, overarticulation, and increased vocal intensity.  SLP Short Term Goal 4 (Week 1): Pt will sustain her attention to basic, tasks for 20 minutes with supervision verbal cues for redirection to task  SLP Short Term Goal 5 (Week 1): Pt will recall basic, daily information with supervision assist verbal cues for use of external aids.   SLP Short Term Goal 6 (Week 1): Pt will monitor and correct verbal errors in 75% of opportunities with supervision verbal cues.   Refer to Care Plan for Long Term Goals  Recommendations for other services: None  Discharge Criteria: Patient will be discharged from SLP if patient refuses treatment 3 consecutive times without medical reason, if treatment goals not met, if there is a change in medical status, if patient makes no progress towards goals or if patient is discharged from hospital.  The above assessment, treatment plan, treatment alternatives and goals were discussed and mutually agreed upon: by patient  Emilio Math 07/25/2015, 4:23 PM

## 2015-07-25 NOTE — Care Management Note (Signed)
[facility] Individual Statement of Services  Patient Name:  Gwendolyn Morales  Date:  07/25/2015  Welcome to the [facility].  Our goal is to provide you with an individualized program based on your diagnosis and situation, designed to meet your specific needs.  With this comprehensive rehabilitation program, you will be expected to participate in at least 3 hours of rehabilitation therapies Monday-Friday, with modified therapy programming on the weekends.  Your rehabilitation program will include the following services:  Physical Therapy (PT), Occupational Therapy (OT), Speech Therapy (ST), 24 hour per day rehabilitation nursing, Therapeutic Recreaction (TR), Neuropsychology, Case Management (Social Worker), Rehabilitation Medicine, Nutrition Services and Pharmacy Services  Weekly team conferences will be held on Wednesday to discuss your progress.  Your Social Worker will talk with you frequently to get your input and to update you on team discussions.  Team conferences with you and your family in attendance may also be held.  Expected length of stay: 21-28 days  Overall anticipated outcome: min assist level  Depending on your progress and recovery, your program may change. Your Social Worker will coordinate services and will keep you informed of any changes. Your Social Worker's name and contact numbers are listed  below.  The following services may also be recommended but are not provided by the [facility]:   Driving Evaluations  Home Health Rehabiltiation Services  Outpatient Rehabilitation Services  Vocational Rehabilitation   Arrangements will be made to provide these services after discharge if needed.  Arrangements include referral to agencies that provide these services.  Your insurance has been verified to be:  None-Medicaid pending Your primary doctor is:  None  Pertinent information will be shared with your doctor  and your insurance company.  Social Worker:  Dossie DerBecky Yovany Clock, SW [phone] or [phone]) [phone]  Information discussed with and copy given to patient by: Lucy Chrisupree, Theron [place] G, 07/25/2015, 3:26 PM

## 2015-07-25 NOTE — Progress Notes (Signed)
Speech Language Pathology Daily Session Note  Patient Details  Name: Gwendolyn Morales MRN: [id number] Date of Birth: 1965-03-18  Today's Date: 07/25/2015 SLP Individual Time: 1500-1530 SLP Individual Time Calculation (min): 30 min  Short Term Goals: Week 1: SLP Short Term Goal 1 (Week 1): Pt will consume therapeutic trials of thin liquids with supervision cues for use of swallowing precautions and minimal overt s/s of aspiration  SLP Short Term Goal 2 (Week 1): Pt will consume dys 2 textures and nectar thick liquids with supervision cues for use of swallowing precautions and minimal overt s/s of aspiration  SLP Short Term Goal 3 (Week 1): Pt will be intelligible at the sentence level in 75% of opportunities with mod assist verbal cues for pacing, overarticulation, and increased vocal intensity.  SLP Short Term Goal 4 (Week 1): Pt will sustain her attention to basic, tasks for 20 minutes with supervision verbal cues for redirection to task  SLP Short Term Goal 5 (Week 1): Pt will recall basic, daily information with min assist verbal cues for use of external aids.    Skilled Therapeutic Interventions: Skilled treatment session focused on speech goals. Upon arrival, patient was sitting upright in the wheelchair with PMSV in place. Patient had been incontinent of urine (solid chair cushion, floor,etc) and was transferred to the bed via the [facility] for peri-hygiene. Patient followed 1 step commands throughout session with extra time. Patient was ~50% intelligible at the phrase and sentence level and required Mod-Max A verbal and visual cues for efficient breath support with diaphragmatic breathing and coordination of exhalation and voicing. Patient's vitals remained WFL throughout session. Patient left supine in bed with alarm on and all needs within reach. Continue with current plan of care.    Function:  Eating Eating                 Cognition Comprehension Comprehension assist level:  Understands basic 75 - 89% of the time/ requires cueing 10 - 24% of the time  Expression   Expression assist level: Expresses basic 50 - 74% of the time/requires cueing 25 - 49% of the time. Needs to repeat parts of sentences.  Social Interaction Social Interaction assist level: Interacts appropriately 90% of the time - Needs monitoring or encouragement for participation or interaction.  Problem Solving Problem solving assist level: Solves basic 75 - 89% of the time/requires cueing 10 - 24% of the time  Memory Memory assist level: Recognizes or recalls 75 - 89% of the time/requires cueing 10 - 24% of the time    Pain Pain Assessment Pain Assessment: No/denies pain  Therapy/Group: Individual Therapy  Gwendolyn Morales [place] 07/25/2015, 3:43 PM

## 2015-07-25 NOTE — Evaluation (Signed)
Occupational Therapy Assessment and Plan  Patient Details  Name: Gwendolyn Morales MRN: [id number] Date of Birth: October 28, 1965  OT Diagnosis: abnormal posture, cognitive deficits, hemiplegia affecting non-dominant side and muscle weakness (generalized) Rehab Potential: Rehab Potential (ACUTE ONLY): Good ELOS: 21-24 days   Today's Date: 07/25/2015 OT Individual Time: 5885-0277 OT Individual Time Calculation (min): 62 min     Problem List:  Patient Active Problem List   Diagnosis Date Noted  . Cerebral parenchymal hemorrhage ([place]) 07/24/2015  . 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 Elevated bilirubin   . Adjustment disorder with mixed anxiety and depressed mood   . Diastolic dysfunction   . Morbid obesity due to excess calories ([place])   . H/O noncompliance with medical treatment, presenting hazards to health   . Tachypnea   . Acute blood loss anemia   . Dysphagia, post-stroke   . HTN (hypertension)   . Productive cough   . Tracheostomy status ([place])   . Nontraumatic cortical hemorrhage of left cerebral hemisphere ([place])   . Hypokalemia   . Azotemia   . Acute respiratory failure ([place])   . Acute respiratory failure with hypoxemia ([place])   . Cytotoxic brain edema ([place]) 07/13/2015  . 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 ICH (intracerebral hemorrhage) ([place]) 07/12/2015    Past Medical History:  Past Medical History  Diagnosis Date  . Hypertension    Past Surgical History: No past surgical history on file.  Assessment & Plan Clinical Impression: Patient is a 50 y.o. year old female with recent admission to the hospital on 07/12/15 a fall at home with subsequent right sided weakness and speech difficulty. History taken from chart review. BP at admission markedly elevated at 280/150 and she was started on cardene drip for control. CT head done showing acute hemorrhage 1.7 cm X 1.3 cm at left parietal lobe, small focus of acute hemorrhage left pons and  scattered small vessel microangiopathy. She was noted to have intermittent hypoxia with sonorous respirations and was intubated for airway protection. CTA head/neck showed stable hemorrhage, no stenosis, AVM or aneurysm, and advanced dolichoectasia. MRI brain revealed posterior left frontal opercular hematoma with mild vasogenic edema, left pontine hematoma, acute/ subacute infarcts in bilateral frontal and parietal lobes and scattered chronic micro- hemorrhages .  Patient transferred to CIR on 07/24/2015 .    Patient currently requires total with basic self-care skills secondary to muscle weakness, impaired timing and sequencing, unbalanced muscle activation and decreased coordination, decreased awareness, decreased problem solving and decreased memory and decreased standing balance, decreased postural control, hemiplegia and decreased balance strategies.  Prior to hospitalization, patient could complete ADLs with independent .  Patient will benefit from skilled intervention to decrease level of assist with basic self-care skills and increase independence with basic self-care skills prior to discharge home with care partner.  Anticipate patient will require minimal physical assistance and follow up home health.  OT - End of Session Activity Tolerance: Tolerates 30+ min activity with multiple rests Endurance Deficit: Yes Endurance Deficit Description: O2 sats 98 % on 28% FIO2 OT Assessment Rehab Potential (ACUTE ONLY): Good OT Patient demonstrates impairments in the following area(s): Balance;Endurance;Motor;Cognition;Pain;Sensory;Safety OT Basic ADL's Functional Problem(s): Grooming;Bathing;Dressing;Toileting OT Transfers Functional Problem(s): Toilet;Tub/Shower OT Additional Impairment(s): Fuctional Use of Upper Extremity OT Plan OT Intensity: Minimum of 1-2 x/day, 45 to 90 minutes OT Duration/Estimated Length of Stay: 21-24 days OT Treatment/Interventions: Balance/vestibular  training;Neuromuscular re-education;Self Care/advanced ADL retraining;Therapeutic Exercise;UE/LE Strength taining/ROM;DME/adaptive equipment instruction;Cognitive remediation/compensation;Community reintegration;Patient/family education;UE/LE Coordination activities;Discharge planning;Functional  mobility training;Therapeutic Activities OT Self Feeding Anticipated Outcome(s): supervision OT Basic Self-Care Anticipated Outcome(s): supervision to min assist OT Toileting Anticipated Outcome(s): min assist level OT Bathroom Transfers Anticipated Outcome(s): min assist level OT Recommendation Patient destination: Home Follow Up Recommendations: Home health OT;24 hour supervision/assistance Equipment Recommended: Tub/shower bench;3 in 1 bedside comode;Wheelchair (measurements);Wheelchair cushion (measurements)   Skilled Therapeutic Intervention Began education on selfcare re-training sitting EOB.  Min assist for static sitting balance during bathing with increased lean to the right side in sitting.  Total assist for sit to stand from EOB during session with max instructional cueing for hand placement during task.  Pt with frequent questioning regarding expected progress during rehab stay.  Therapist emphasized the expectation that pt would need at least min assist 24/7 at home.  She reports that her son and spouse should be able to provide this.  Oxygen sats 98 % on 5Ls at 28% during session.  Pt left in bed with call button in reach.    OT Evaluation Precautions/Restrictions  Precautions Precautions: Fall Restrictions Weight Bearing Restrictions: No  Vital Signs Therapy Vitals Temp: 98.7 F (37.1 C) Temp Source: Oral Pulse Rate: 75 Resp: 20 BP: 127/89 mmHg Patient Position (if appropriate): Lying Oxygen Therapy SpO2: 98 % O2 Device: Tracheostomy Collar O2 Flow Rate (L/min): 5 L/min FiO2 (%): 28 % Pain Pain Assessment Pain Assessment: No/denies pain Home Living/Prior Functioning Home  Living Available Help at Discharge: Family Type of Home: House Home Access: Level entry Home Layout: One level Bathroom Shower/Tub: Chiropodist: Standard  Lives With: Spouse, Son IADL History Education: high school  Prior Function  Able to Take Stairs?: Yes Driving: Yes Vocation: Unemployed Leisure: Hobbies-yes (Comment) Comments: baking, family photos, scrap booking ADL  See Function Section of Chart for details  Vision/Perception  Vision- History Baseline Vision/History: No visual deficits Patient Visual Report: No change from baseline Vision- Assessment Vision Assessment?: Yes Eye Alignment: Within Functional Limits Ocular Range of Motion: Within Functional Limits Alignment/Gaze Preference: Within Defined Limits Tracking/Visual Pursuits: Decreased smoothness of horizontal tracking Saccades: Within functional limits Convergence: Within functional limits Visual Fields: No apparent deficits  Cognition Overall Cognitive Status: Impaired/Different from baseline Arousal/Alertness: Awake/alert Orientation Level: Person;Place;Situation Person: Oriented Place: Oriented Situation: Oriented Year: 2017 Month: June Day of Week: Correct Memory: Impaired Memory Impairment: Decreased recall of new information Immediate Memory Recall: Sock;Blue;Bed Memory Recall: Sock;Blue Memory Recall Sock: Without Cue Memory Recall Blue: Without Cue Attention: Sustained Sustained Attention: Appears intact Sustained Attention Impairment: Functional basic Awareness: Impaired Awareness Impairment: Anticipatory impairment;Emergent impairment Problem Solving: Impaired Problem Solving Impairment: Functional basic Safety/Judgment: Appears intact Comments: Pt reports that she does not feel safe attempting to get up OOB without assistance and is able to identify how to get assist with use of the call button. Sensation Sensation Light Touch: Appears Intact (in  BUEs) Stereognosis: Not tested Hot/Cold: Not tested Proprioception: Not tested Coordination Gross Motor Movements are Fluid and Coordinated: No Fine Motor Movements are Fluid and Coordinated: No Coordination and Movement Description: Pt with BUE weakness with the RUE being more impaired than the left.  Increased dysmetria noted for RUE finger to nose testing.  Pt uses both UEs as active assist for bathing and dressing tasks however.   Motor  Motor Motor: Hemiplegia;Motor apraxia Mobility  Bed Mobility Bed Mobility: Supine to Sit Rolling Right: 5: Supervision;With rail Rolling Right Details: Verbal cues for technique Rolling Left: 5: Supervision;With rail Rolling Left Details: Verbal cues for technique Right Sidelying to Sit:  2: Max assist Supine to Sit: HOB flat;2: Max assist Supine to Sit Details: Manual facilitation for weight shifting;Manual facilitation for placement;Verbal cues for sequencing Transfers Transfers: Sit to Stand;Stand to Sit Sit to Stand: With upper extremity assist;1: +2 Total assist Sit to Stand: Patient Percentage: 30% Sit to Stand Details: Manual facilitation for weight shifting Stand to Sit: 1: +2 Total assist;With upper extremity assist;To bed Stand to Sit: Patient Percentage: 30% Stand to Sit Details (indicate cue type and reason): Manual facilitation for weight shifting  Trunk/Postural Assessment  Cervical Assessment Cervical Assessment: Within Functional Limits Thoracic Assessment Thoracic Assessment: Within Functional Limits Lumbar Assessment Lumbar Assessment: Exceptions to [facility] (increased right lateral lean and slight right trunk elongation in sitting) Postural Control Postural Control: Deficits on evaluation Righting Reactions: delayed Protective Responses: delayed and inadequate  Balance Balance Balance Assessed: Yes Static Sitting Balance Static Sitting - Level of Assistance: 4: Min assist Dynamic Sitting Balance Dynamic Sitting - Level  of Assistance: 3: Mod assist Static Standing Balance Static Standing - Level of Assistance: 1: +2 Total assist;Patient percentage (comment);Other (comment) (pt 30%) Dynamic Standing Balance Dynamic Standing - Level of Assistance: Not tested (comment) Extremity/Trunk Assessment RUE Assessment RUE Assessment: Exceptions to [facility] (AROM WFLS for elbow flexion/extension and digit flexion/extension.  Shoulder flexion 0-100 degrees with strength 3-/5.  Elbow strength 3+/5, grip 3+/5  Decreased FM control ) LUE Assessment LUE Assessment: Exceptions to WFL (AROM WFLs  with strength 4/5 throughout)   See Function Navigator for Current Functional Status.   Refer to Care Plan for Long Term Goals  Recommendations for other services: None  Discharge Criteria: Patient will be discharged from OT if patient refuses treatment 3 consecutive times without medical reason, if treatment goals not met, if there is a change in medical status, if patient makes no progress towards goals or if patient is discharged from hospital.  The above assessment, treatment plan, treatment alternatives and goals were discussed and mutually agreed upon: by patient  Elky Funches OTR/L 07/25/2015, 5:43 PM

## 2015-07-25 NOTE — Evaluation (Signed)
Physical Therapy Assessment and Plan  Patient Details  Name: Sharolyn Weber MRN: [id number] Date of Birth: 1966-01-08  PT Diagnosis: Abnormality of gait, Cognitive deficits, Hemiparesis dominant, Hypertonia and Impaired sensation Rehab Potential: Good ELOS: 3.5-4 weeks   Today's Date: 07/25/2015 PT Individual Time: 1300-1400 PT Individual Time Calculation (min): 60 min    Problem List:  Patient Active Problem List   Diagnosis Date Noted  . Cerebral parenchymal hemorrhage ([place]) 07/24/2015  . 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 Elevated bilirubin   . Adjustment disorder with mixed anxiety and depressed mood   . Diastolic dysfunction   . Morbid obesity due to excess calories ([place])   . H/O noncompliance with medical treatment, presenting hazards to health   . Tachypnea   . Acute blood loss anemia   . Dysphagia, post-stroke   . HTN (hypertension)   . Productive cough   . Tracheostomy status ([place])   . Nontraumatic cortical hemorrhage of left cerebral hemisphere ([place])   . Hypokalemia   . Azotemia   . Acute respiratory failure ([place])   . Acute respiratory failure with hypoxemia ([place])   . Cytotoxic brain edema ([place]) 07/13/2015  . 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 ICH (intracerebral hemorrhage) ([place]) 07/12/2015    Past Medical History:  Past Medical History  Diagnosis Date  . Hypertension    Past Surgical History: No past surgical history on file.  Assessment & Plan Clinical Impression: Nita Whitmire is a 50 y.o. female with history of morbid obesity, HTN and medical non-compliance who was admitted on 07/12/15 a fall at home with subsequent right sided weakness and speech difficulty. History taken from chart review. BP at admission markedly elevated at 280/150 and she was started on cardene drip for control. CT head done showing acute hemorrhage 1.7 cm X 1.3 cm at left parietal lobe, small focus of acute hemorrhage left pons and scattered  small vessel microangiopathy. She was noted to have intermittent hypoxia with sonorous respirations and was intubated for airway protection. CTA head/neck showed stable hemorrhage, no stenosis, AVM or aneurysm, and advanced dolichoectasia. 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 Dr. 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 To consider LP or cerebral angio if indicated.   Patient continues to have copious oral secretions with UE edema. She was started on IV Vanc/Zosyn for treatment of aspiration PNA. Blood pressure difficulty to control and renal ultrasound done revealing minimal flow in right RA with smaller right kidney. 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 Blood pressures improving therefore they signed off 06/09. 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 She was weaned to ATC on 06/10 and vasculitis panel with elevated ESR, CRP and RF otherwise negative. She continues to have issues with anxiety requiring propofol and was transitioned to busparl.  Patient transferred to CIR on 07/24/2015 .   Patient currently requires total with mobility secondary to muscle weakness and muscle joint tightness, decreased cardiorespiratoy endurance and decreased oxygen support and impaired timing and sequencing, abnormal tone, unbalanced muscle activation, motor apraxia, decreased coordination and decreased motor planning.  Prior to hospitalization, patient was independent  with mobility and lived with Spouse, Son  in a House home.  Home access is  Level entry.  Patient will benefit from skilled PT intervention to  maximize safe functional mobility, minimize fall risk and decrease caregiver burden for planned discharge home with 24 hour assist.  Anticipate patient will benefit from follow up [facility] at discharge.  PT - End of Session Activity Tolerance: Tolerates < 10 min activity, no significant change in vital signs Endurance Deficit: Yes Endurance Deficit Description: O2 sats 93 % on 28% FIO2 PT Assessment Rehab Potential (ACUTE/IP ONLY): Good PT Patient demonstrates impairments in the following area(s): Balance;Endurance;Motor;Sensory PT Transfers Functional Problem(s): Bed Mobility;Bed to Chair;Car;Furniture PT Locomotion Functional Problem(s): Ambulation;Wheelchair Mobility;Stairs PT Plan PT Intensity: Minimum of 1-2 x/day ,45 to 90 minutes PT Frequency: 5 out of 7 days PT Duration Estimated Length of Stay: 3.5-4 weeks PT Treatment/Interventions: Ambulation/gait training;Balance/vestibular training;Cognitive remediation/compensation;Community reintegration;Discharge planning;Neuromuscular re-education;Functional mobility training;Functional electrical stimulation;DME/adaptive equipment instruction;Patient/family education;Pain management;Psychosocial support;Splinting/orthotics;UE/LE Coordination activities;UE/LE Strength taining/ROM;Therapeutic Exercise;Therapeutic Activities;Stair training;Wheelchair propulsion/positioning PT Transfers Anticipated Outcome(s): min assist PT Locomotion Anticipated Outcome(s): min assist gait x 50' and up/down 4 steps 2 rails; supervision w/c x 150' PT Recommendation Recommendations for Other Services: Neuropsych consult (pt tearful about mother's recent death) Follow Up Recommendations: Home health PT Patient destination: Home Equipment Recommended: To be determined  Skilled Therapeutic Intervention: eval completed.  ELOS and LTGs explained to pt.  She stated she was anxious and worried.  PT provided emotional support and instructed pt in paced breathing with pursed  lips.  Attempted sit> stand from raised bed; pt unable with assist of 1; +2 to use Stedy with difficulty clearing seat pads as pt limited in hip and trunk extension (bariatric Stedy unavailable).  Once sitting on seat of Stedy, pt unable to extend spine for upright sitting. PT advised Nsg to use [facility] for now.  Pt propelled w/c briefly in room with total assist.  Body habitus and LUE weakness limit w/c propulsion; she will not use a hemi ht w/c for now due to difficulty with sit>< stand from hemi ht w/c. Pt left resting in w/c with all needs within reach.   PT Evaluation Precautions/Restrictions   General   Vital SignsTherapy Vitals Pulse Rate: 94 BP: 127/89 mmHg Patient Position (if appropriate): Sitting Oxygen Therapy SpO2: 93 % O2 Device: Tracheostomy Collar O2 Flow Rate (L/min): 5 L/min FiO2 (%): 28 % Pain Pain Assessment Pain Assessment: No/denies pain Home Living/Prior Functioning Home Living Available Help at Discharge: Family Type of Home: House Home Access: Level entry Home Layout: One level Bathroom Shower/Tub: Tub/shower unit  Lives With: Spouse;Son Prior Function  Able to Take Stairs?: Yes (but avoided them due to old L knee injury) Driving: Yes Vocation: Unemployed Leisure: Hobbies-yes (Comment) Comments: baking, family photos, scrap booking Vision/Perception - no apparent deficits, but pt stated "everything is so different; I don't know if my vision is, too".    Cognition Overall Cognitive Status: Impaired/Different from baseline Arousal/Alertness: Awake/alert Orientation Level: Oriented X4 Attention: Sustained Sustained Attention: Impaired Sustained Attention Impairment: Verbal basic;Functional basic Memory: Impaired Memory Impairment: Other (comment) (working memory ) Awareness: Impaired Awareness Impairment: Emergent impairment Sensation Sensation Light Touch: Impaired Detail Light Touch Impaired Details: Absent RLE (great toe) Proprioception:  Impaired Detail Proprioception Impaired Details: Impaired RLE (accurate 3/5 motions great toe) Motor  Motor Motor: Hemiplegia;Motor apraxia  Mobility Bed Mobility Bed Mobility: Rolling Right;Rolling Left;Right Sidelying to Sit Rolling Right: 5: Supervision;With rail Rolling Right Details: Verbal cues for technique Rolling Left: 5: Supervision;With rail Rolling Left Details: Verbal cues  for technique Right Sidelying to Sit: 2: Max assist Transfers Transfers: Yes Transfer via Lift Equipment: Stedy (+2) Locomotion  Ambulation Ambulation: No Gait Gait: No Stairs / Additional Locomotion Stairs: No Architect: Yes Wheelchair Assistance: 1: +1 Total Lexicographer: Both upper extremities Wheelchair Parts Management: Needs assistance Distance: 5  Trunk/Postural Assessment  Cervical Assessment Cervical Assessment: Within Functional Limits Thoracic Assessment Thoracic Assessment: Within Functional Limits Lumbar Assessment Lumbar Assessment: Within Functional Limits Postural Control Postural Control: Deficits on evaluation Righting Reactions: delayed Protective Responses: delayed and inadequate  Balance Balance Balance Assessed: Yes Static Sitting Balance Static Sitting - Level of Assistance: 5: Stand by assistance Dynamic Sitting Balance Dynamic Sitting - Level of Assistance: 4: Min assist Static Standing Balance Static Standing - Level of Assistance: Not tested (comment) Dynamic Standing Balance Dynamic Standing - Level of Assistance: Not tested (comment) Extremity Assessment      RLE Assessment RLE Assessment: Exceptions to [facility] (tight heel cord and hamstring) RLE Strength RLE Overall Strength Comments: in supine, grossly 2/5 throughout; difficult to asssess RLE Tone RLE Tone Comments: ankle clonus LLE Assessment LLE Assessment: Exceptions to [facility] LLE Strength LLE Overall Strength Comments: difficuly to assess: in supine;  at least 3+/5 throughout   See Function Navigator for Current Functional Status.   Refer to Care Plan for Long Term Goals  Recommendations for other services: Neuropsych  Discharge Criteria: Patient will be discharged from PT if patient refuses treatment 3 consecutive times without medical reason, if treatment goals not met, if there is a change in medical status, if patient makes no progress towards goals or if patient is discharged from hospital.  The above assessment, treatment plan, treatment alternatives and goals were discussed and mutually agreed upon: by patient  Roy Tokarz 07/25/2015, 4:43 PM

## 2015-07-25 NOTE — Progress Notes (Signed)
Patient information reviewed and entered into eRehab system by Marsean Elkhatib, RN, CRRN, PPS Coordinator.  Information including medical coding and functional independence measure will be reviewed and updated through discharge.    

## 2015-07-26 ENCOUNTER — Inpatient Hospital Stay (HOSPITAL_COMMUNITY): Payer: Self-pay | Admitting: Speech Pathology

## 2015-07-26 ENCOUNTER — Encounter (HOSPITAL_COMMUNITY): Payer: Self-pay

## 2015-07-26 ENCOUNTER — Inpatient Hospital Stay (HOSPITAL_COMMUNITY): Payer: Self-pay | Admitting: Occupational Therapy

## 2015-07-26 ENCOUNTER — Inpatient Hospital Stay (HOSPITAL_COMMUNITY): Payer: Self-pay | Admitting: Physical Therapy

## 2015-07-26 DIAGNOSIS — G8191 Hemiplegia, unspecified affecting right dominant side: Secondary | ICD-10-CM

## 2015-07-26 NOTE — Progress Notes (Signed)
Occupational Therapy Session Note  Patient Details  Name: Gwendolyn SimonSherry Morales MRN: [id number] Date of Birth: Jul 10, 1965  Today's Date: 07/26/2015 OT Individual Time: 0902-1001 OT Individual Time Calculation (min): 59 min    Short Term Goals: Week 1:  OT Short Term Goal 1 (Week 1): Pt will maintain static/dynamic sittting EOC during UB dressing with supervision. OT Short Term Goal 2 (Week 1): Pt will complete LB bathing sit to stand with max assist of 1. OT Short Term Goal 3 (Week 1): Pt will complete UB dressing in unsupported sitting with supervision. OT Short Term Goal 4 (Week 1): Pt will transfer from supine to sit EOB in preparation for selfcare tasks with supervision.  OT Short Term Goal 5 (Week 1): Pt will perform squat pivot transfer to drop arm commode with max assist.   Skilled Therapeutic Interventions/Progress Updates:    Pt completed self feeding from wheelchair level to begin session.  Emphasis on use of the dominant RUE to scoop up food and bring to mouth as well as for grasping cup and bringing up to mouth.  Provided red foam grip over spoon for easier grasp.  Occasional min assist needed for bringing cup to mouth and drinking while incorporating shoulder flexion.  Pt worked on dressing to finish session with max assist needed for donning bra and pullover shirt.  Total assist +3(pt 25%) for donning pull-up shorts sit to stand.  Decreased hip extension in standing with pt maintaining flexed trunk and pelvis.  Oxygen sats 95% on room air with trach collar and PMV in place.  Pt left in wheelchair with nursing present to finish session.    Therapy Documentation Precautions:  Precautions Precautions: Fall Restrictions Weight Bearing Restrictions: No  Pain: Pain Assessment Pain Assessment: No/denies pain ADL: See Function Navigator for Current Functional Status.   Therapy/Group: Individual Therapy  [name] OTR/L 07/26/2015, 12:16 PM

## 2015-07-26 NOTE — Progress Notes (Signed)
Physical Therapy Session Note  Patient Details  Name: Reha Martinovich MRN: [id number] Date of Birth: 12-Feb-1965  Today's Date: 07/26/2015 PT Individual Time: 0800-0900 PT Individual Time Calculation (min): 60 min   Short Term Goals: Week 1:  PT Short Term Goal 1 (Week 1): pt will move supine> sit with mod assist PT Short Term Goal 2 (Week 1): pt will transfer bed>< w/c with assist of 1 person PT Short Term Goal 3 (Week 1): pt will lock/unlock w/c brakes with 1 cue PT Short Term Goal 4 (Week 1): pt will stand x 2 minutes during functional activity  Skilled Therapeutic Interventions/Progress Updates:  Pt presented in bed agreeable to therapy.  Pt performed bed mobility rolling L/R with maxA x1 with verbal cues for hand placement and max assist for truncal support and LE to facilitate placement of pad for Maxi Move.  Increased difficulty rolling R vs L.  Pt transferred to high back w/c via [employer].  Pt then performed seated AA LAQ, a/p x 10 and provided gastroc stretches bilaterally 20 sec x 3.  Pt performed AA forward flexion in w/c with PTA support to initiate facilitation of positioning in chair for pressure release.  Pt able to push through with BUE and assist with for scooting back in w/c.  Pt left in chair with call bell/phone within reach and all needs met.      Therapy Documentation Precautions:  Precautions Precautions: Fall Restrictions Weight Bearing Restrictions: No General:   Vital Signs: Therapy Vitals Pulse Rate: 83 Resp: 16 Patient Position (if appropriate): Sitting Oxygen Therapy SpO2: 96 % O2 Device: Not Delivered Pain:   Mobility:   Locomotion :    Trunk/Postural Assessment :    Balance:   Exercises:   Other Treatments:     See Function Navigator for Current Functional Status.   Therapy/Group: Individual Therapy  Maymie Brunke  Taygen Acklin, PTA  07/26/2015, 12:15 PM

## 2015-07-26 NOTE — Progress Notes (Signed)
Spoke with RT: patient has a good cough and has not required suctioning in the past 24 hours. She felt ATC not needed at this time--will change it to prn.

## 2015-07-26 NOTE — Progress Notes (Signed)
Speech Language Pathology Daily Session Note  Patient Details  Name: Gwendolyn SimonSherry Morales MRN: [id number] Date of Birth: 01/03/1966  Today's Date: 07/26/2015 SLP Individual Time: [id number] SLP Individual Time Calculation (min): 29 min  Short Term Goals: Week 1: SLP Short Term Goal 1 (Week 1): Pt will consume therapeutic trials of thin liquids with supervision cues for use of swallowing precautions and minimal overt s/s of aspiration  SLP Short Term Goal 2 (Week 1): Pt will consume dys 2 textures and nectar thick liquids with supervision cues for use of swallowing precautions and minimal overt s/s of aspiration  SLP Short Term Goal 3 (Week 1): Pt will be intelligible at the sentence level in 75% of opportunities with min assist verbal cues for pacing, overarticulation, and increased vocal intensity.  SLP Short Term Goal 4 (Week 1): Pt will sustain her attention to basic, tasks for 20 minutes with supervision verbal cues for redirection to task  SLP Short Term Goal 5 (Week 1): Pt will recall basic, daily information with supervision assist verbal cues for use of external aids.   SLP Short Term Goal 6 (Week 1): Pt will monitor and correct verbal errors in 75% of opportunities with supervision verbal cues.   Skilled Therapeutic Interventions:  Pt was seen for skilled ST targeting goals for dysphagia and communication.  SLP facilitated the session with trials of regular, unthickened water following oral care to continue working towards liquids advancement.  Pt demonstrated intermittent, soft throat clear and multiple swallows with cup sips of thin liquids; no other overt s/s of aspiration evident and vocal quality remained clear following trials.  Recommend implementing the water protocol.  Pt would also benefit from an objective swallow assessment at earliest available appointment to determine readiness for advancement.  Pt required overall mod assist verbal cues for pacing, coordination of respiration and  phonation, as well as increased vocal intensity to achieve intelligibility at the phrase level during conversational exchanges with therapist.  Pt was left in wheelchair with call bell within reach.  Continue per current plan of care.    Function:  Eating Eating   Modified Consistency Diet: Yes Eating Assist Level: Supervision or verbal cues   Eating Set Up Assist For: Applying device (includes dentures)       Cognition Comprehension Comprehension assist level: Understands basic 90% of the time/cues < 10% of the time  Expression Expression assistive device: Talk trach valve Expression assist level: Expresses basic 50 - 74% of the time/requires cueing 25 - 49% of the time. Needs to repeat parts of sentences.  Social Interaction Social Interaction assist level: Interacts appropriately 75 - 89% of the time - Needs redirection for appropriate language or to initiate interaction.  Problem Solving Problem solving assist level: Solves basic 50 - 74% of the time/requires cueing 25 - 49% of the time  Memory Memory assist level: Recognizes or recalls 75 - 89% of the time/requires cueing 10 - 24% of the time    Pain Pain Assessment Pain Assessment: No/denies pain  Therapy/Group: Individual Therapy  Gwendolyn Morales, Melanee SpryNicole L 07/26/2015, 12:54 PM

## 2015-07-26 NOTE — Progress Notes (Signed)
Subjective/Complaints: No issues overnight, working with OT. Tends to lean towards the right side. Discussed patient's bowel issues with R and  Review of systems negative for shortness of breath, chest pain, nausea vomiting. Positive for diarrhea  Objective: Vital Signs: Blood pressure 143/89, pulse 90, temperature 98.3 F (36.8 C), temperature source Oral, resp. rate 18, weight 114 kg (251 lb 5.2 oz), SpO2 95 %. No results found. Results for orders placed or performed during the hospital encounter of 07/24/15 (from the past 72 hour(s))  CBC WITH DIFFERENTIAL     Status: Abnormal   Collection Time: 07/25/15  5:08 AM  Result Value Ref Range   WBC 9.0 4.0 - 10.5 K/uL   RBC 4.45 3.87 - 5.11 MIL/uL   Hemoglobin 9.4 (L) 12.0 - 15.0 g/dL   HCT 31.3 (L) 36.0 - 46.0 %   MCV 70.3 (L) 78.0 - 100.0 fL   MCH 21.1 (L) 26.0 - 34.0 pg   MCHC 30.0 30.0 - 36.0 g/dL   RDW 19.0 (H) 11.5 - 15.5 %   Platelets 412 (H) 150 - 400 K/uL   Neutrophils Relative % 73 %   Lymphocytes Relative 10 %   Monocytes Relative 11 %   Eosinophils Relative 6 %   Basophils Relative 0 %   Neutro Abs 6.6 1.7 - 7.7 K/uL   Lymphs Abs 0.9 0.7 - 4.0 K/uL   Monocytes Absolute 1.0 0.1 - 1.0 K/uL   Eosinophils Absolute 0.5 0.0 - 0.7 K/uL   Basophils Absolute 0.0 0.0 - 0.1 K/uL   RBC Morphology POLYCHROMASIA PRESENT   Comprehensive metabolic panel     Status: Abnormal   Collection Time: 07/25/15  5:08 AM  Result Value Ref Range   Sodium 136 135 - 145 mmol/L   Potassium 3.8 3.5 - 5.1 mmol/L   Chloride 103 101 - 111 mmol/L   CO2 26 22 - 32 mmol/L   Glucose, Bld 94 65 - 99 mg/dL   BUN 15 6 - 20 mg/dL   Creatinine, Ser 0.76 0.44 - 1.00 mg/dL   Calcium 9.2 8.9 - 10.3 mg/dL   Total Protein 6.5 6.5 - 8.1 g/dL   Albumin 2.4 (L) 3.5 - 5.0 g/dL   AST 80 (H) 15 - 41 U/L   ALT 162 (H) 14 - 54 U/L   Alkaline Phosphatase 383 (H) 38 - 126 U/L   Total Bilirubin 2.5 (H) 0.3 - 1.2 mg/dL   GFR calc non Af Amer >60 >60 mL/min   GFR calc Af Amer >60 >60 mL/min    Comment: (NOTE) The eGFR has been calculated using the CKD EPI equation. This calculation has not been validated in all clinical situations. eGFR's persistently <60 mL/min signify possible Chronic Kidney Disease.    Anion gap 7 5 - 15  Magnesium     Status: None   Collection Time: 07/25/15  5:08 AM  Result Value Ref Range   Magnesium 1.8 1.7 - 2.4 mg/dL  Phosphorus     Status: None   Collection Time: 07/25/15  5:08 AM  Result Value Ref Range   Phosphorus 4.2 2.5 - 4.6 mg/dL     HEENT: #6 Shiley, well-placed, no surrounding erythema or drainage Cardio: RRR and no murmurs or extra sounds Resp: CTA B/L and unlabored GI: BS positive and non-distended nontender Extremity:  Pulses positive and No Edema Skin:   Other ttrach site CDI, IV site left arm CDI Neuro: Alert/Oriented, Cranial Nerve II-XII normal, Normal Sensory, Abnormal Motor /5 bilateral  deltoid, biceps, triceps, grip, hip flexor, knee extensor, ankle dorsiflexor and plantar flexor and Abnormal FMC Ataxic/ dec FMC Musc/Skel:  Normal Gen. no acute distress   Assessment/Plan: 1. 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 Physiatrist is providing close team supervision and 24 hour management of active medical problems listed below. Physiatrist and rehab team continue to assess barriers to discharge/monitor patient progress toward functional and medical goals. FIM: Function - Bathing Position: Sitting EOB Body parts bathed by patient: Left arm, Right arm, Chest, Abdomen, Right upper leg, Left upper leg Body parts bathed by helper: Back, Left lower leg, Right lower leg, Buttocks, Front perineal area Assist Level: 2 helpers  Function- Upper Body Dressing/Undressing What is the patient wearing?: Hospital gown Function - Lower  Body Dressing/Undressing What is the patient wearing?: Hospital Gown, Non-skid slipper socks Non-skid slipper socks- Performed by helper: Don/doff right sock, Don/doff left sock Assist for lower body dressing: 2 Helpers  Function - Toileting Toileting activity did not occur: No continent bowel/bladder event     Function - Chair/bed transfer Chair/bed transfer method: Other Chair/bed transfer assist level: 2 helpers Chair/bed transfer assistive device: Mechanical lift Chair/bed transfer details: Visual cues for safe use of DME/AE, Verbal cues for safe use of DME/AE, Manual facilitation for weight shifting, Manual facilitation for placement, Verbal cues for technique  Function - Locomotion: Wheelchair Will patient use wheelchair at discharge?: Yes Type: Manual Max wheelchair distance: 5 Assist Level: Dependent (Pt equals 0%) Turns around,maneuvers to table,bed, and toilet,negotiates 3% grade,maneuvers on rugs and over doorsills: No Function - Locomotion: Ambulation Ambulation activity did not occur: Safety/medical concerns (unsafe to attempt due to weakness)  Function - Comprehension Comprehension: Auditory Comprehension assist level: Understands basic 75 - 89% of the time/ requires cueing 10 - 24% of the time  Function - Expression Expression: Verbal Expression assistive device: Talk trach valve Expression assist level: Expresses basic 50 - 74% of the time/requires cueing 25 - 49% of the time. Needs to repeat parts of sentences.  Function - Social Interaction Social Interaction assist level: Interacts appropriately 75 - 89% of the time - Needs redirection for appropriate language or to initiate interaction.  Function - Problem Solving Problem solving assist level: Solves basic 50 - 74% of the time/requires cueing 25 - 49% of the time  Function - Memory Memory assist level: Recognizes or recalls 75 - 89% of the time/requires cueing 10 - 24% of the time Patient normally able to  recall (first 3 days only): Staff names and faces, That he or she is in a hospital, Current season, Location of own room   Medical Problem List and Plan: 1.  Functional deficits, right hemiparesis and general debility secondary to hypertensive left parietal and pontine hemorrhages with subsequent complications, Cont CIR PT, OT, SLP 2.  DVT Prophylaxis/Anticoagulation: Pharmaceutical: Lovenox 3. Pain Management:  Will try low dose ultram prn   4. Mood: team to provide ego support. LCSW to follow for evaluation.   5. Neuropsych: This patient is not capable of making decisions on her own behalf. 6. Skin/Wound Care:  Continue rectal tube for now. Routine pressure relief measures. Maintain adequate nutrition and hydration.   7. Fluids/Electrolytes/Nutrition: Monitor I/O on current diet.  Check lytes in am. 8. VDRF with trach: Continue CFS #6 with ATC.Hope to wean to #4 early next week 9. HTN: Continue norvasc, lisinopril,  apresoline, metoprolol and HCTZ.  Monitor BP every 6 hours and titrate medications as indicated.  Filed Vitals:   07/26/15 0633 07/26/15 0721  BP: 143/89   Pulse: 90 88  Temp: 98.3 F (36.8 C)   Resp: 18 16   10  Hypokalemia: Likely due to diuretics, diarrhea and IVF. Discontinue IVF and increase oral supplement. Recheck lytes were normal yesterday   11. ABLA: Monitor for signs of bleeding. Check stool guaiacs.   12. Dysphagia: Monitor tolerance of dysphagia 2, nectar liquids. Needs full supervision at meals. Discontinue IVF.   13. Abnormal LFT:  Discontinue tylenol.? Shock liver 14. Anxiety: Monitor on buspar bid.  15. Diarrhea: Should improve off tube feeds. Completes antibiotics 6/15, only 1 loose stool 16. Incontinence: recheck UA C and S  LOS (Days) 2 A FACE TO FACE EVALUATION WAS PERFORMED  KIRSTEINS,ANDREW E 07/26/2015, 7:21 AM

## 2015-07-26 NOTE — Progress Notes (Signed)
Physical Therapy Session Note  Patient Details  Name: Gwendolyn Morales MRN: [id number] Date of Birth: Nov 25, 1965  Today's Date: 07/26/2015 PT Individual Time: [id number] PT Individual Time Calculation (min): 44 min   Short Term Goals: Week 1:  PT Short Term Goal 1 (Week 1): pt will move supine> sit with mod assist PT Short Term Goal 2 (Week 1): pt will transfer bed>< w/c with assist of 1 person PT Short Term Goal 3 (Week 1): pt will lock/unlock w/c brakes with 1 cue PT Short Term Goal 4 (Week 1): pt will stand x 2 minutes during functional activity  Skilled Therapeutic Interventions/Progress Updates:    Bed mobility with mod-max A with multimodal cues for placement and posture Lateral reaches L and R x 15 BUE for Nuero re-ed with min A on the  RUE  Lateral scooting with max A and multimodal cues for UE/LE place and improved hips/head relationship.   Mini sit<>stand to clear bottom from bed in stedy x 5 with mod A from PT and improved use of BUE on stedy to pull to stand.   Patient left supine in bed.   Therapy Documentation Precautions:  Precautions Precautions: Fall Restrictions Weight Bearing Restrictions: No  Vital Signs: Therapy Vitals Pulse Rate: 92 Resp: 18 Patient Position (if appropriate): Lying Oxygen Therapy SpO2: 96 % O2 Device: Not Delivered See Function Navigator for Current Functional Status.   Therapy/Group: Individual Therapy  Gwendolyn Morales 07/26/2015, 10:07 PM

## 2015-07-26 NOTE — Progress Notes (Signed)
Social Work  Social Work Assessment and Plan  Patient Details  Name: Gwendolyn Morales MRN: [id number] Date of Birth: [birth year]  Today's Date: 07/26/2015  Problem List:  Patient Active Problem List   Diagnosis Date Noted  . Cerebral parenchymal hemorrhage (HCC) 07/24/2015  . 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 Elevated bilirubin   . Adjustment disorder with mixed anxiety and depressed mood   . Diastolic dysfunction   . Morbid obesity due to excess calories (HCC)   . H/O noncompliance with medical treatment, presenting hazards to health   . Tachypnea   . Acute blood loss anemia   . Dysphagia, post-stroke   . HTN (hypertension)   . Productive cough   . Tracheostomy status (HCC)   . 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 Hypokalemia   . Azotemia   . Acute respiratory failure (HCC)   . Acute respiratory failure with hypoxemia (HCC)   . 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 ICH (intracerebral hemorrhage) (HCC) 07/12/2015   Past Medical History:  Past Medical History  Diagnosis Date  . Hypertension    Past Surgical History: No past surgical history on file. Social History:  reports that she has never smoked. She does not have any smokeless tobacco history on file. She reports that she does not drink alcohol or use illicit drugs.  Family / Support Systems Marital Status: Married Patient Roles: Spouse, Parent Spouse/Significant Other: Gwendolyn Morales [phone]-cell Children: Colby-son [phone] Other Supports: Another son doesn't live in the home Anticipated Caregiver: Family and friends Ability/Limitations of Caregiver: husbnand works 12 hr days but son is not employed but goes to [employer] on-line Caregiver Availability: 24/7 Family Dynamics: Close knit family they have two son's who are supportive and involved. Pt has always been independent and wants to get as close as she  can to this again. They have friends but they will just check on her not provide care.  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   Abuse/Neglect Physical Abuse: Denies Verbal Abuse: Denies Sexual Abuse: Denies Exploitation of patient/patient's resources: Denies Self-Neglect: Denies  Emotional Status Pt's affect, behavior adn adjustment status: Pt is motivated to do as much as she can for herself, she does not feel son will be comfortable helping her in the bathroom. She feels she has made some progress but knows she has a ways to go. She is glad to be here on rehab. Recent Psychosocial Issues: will need to get a PCP to follow her form now on Pyschiatric History: History of anxiety has not seen anoyone for this, would benefit from seeing neuro-psych while here. Will make referral to follow and assist with coping and anxiety Substance Abuse History: No issues  Patient / Family Perceptions, Expectations & Goals Pt/Family understanding of illness & functional limitations: Pt and husband can explain her medical issues, pt looks toward husband to answer questions regarding her medical plan. He has spoken with the MD and have encouraged him to observe in therapies to see her progress. Premorbid pt/family roles/activities: Wife, Mother, church member, friend, etc Anticipated changes in roles/activities/participation: resume Pt/family expectations/goals: Pt states: " I  want to do well here and be able to take care of myself."  Husband states: " I hope she makes good progress while here."  [employer]: None Premorbid Home Care/DME Agencies: None Transportation available at discharge: [employer] referrals recommended: Neuropsychology, Support group (specify)  Discharge Planning Living Arrangements: Spouse/significant other, Children Support Systems: Spouse/significant other, Children, Other relatives, Friends/neighbors, Psychologist, clinical community Type of Residence: Private residence Insurance Resources: Customer service manager Resources: Family Support Financial Screen Referred: Yes Living Expenses: Database administrator Management: Patient Does the patient have any problems obtaining your medications?: Yes (Describe) (has no insurance at this time) Home Management: Patient was the one responsible for but will be family now Patient/Family Preliminary Plans: Return home with husband and son who are planning to provide 24 hr care, son is not comfortable with assisting Mom in the bathroom. Will see how much progress she makes and discuss with pt, husband and son what each is comfortable doing. Plan to be here for 3-4 weeks, know a target discharge date at next team conference. Social Work Anticipated Follow Up Needs: HH/OP, Support Group  Clinical Impression Pleasant falee who is doing well considering all she has been through with this hospitalization. She has supportive family who are willing to assist her at discharge. She is concerned her son may not feel comfortable assisting her With toileting needs. She is hoping she will be able to take care of this on her own. Will see if Medicaid and Disability applications started.  Will need PCP to follow up with her medical issues, will work on this while here. Would benefit from neuro-psych seeing will make referral.  Kaila Devries, Lemar Livings 07/26/2015, 1:02 PM

## 2015-07-26 NOTE — Progress Notes (Signed)
Speech Language Pathology Daily Session Note  Patient Details  Name: Gwendolyn SimonSherry Morales MRN: [id number] Date of Birth: 01-09-1966  Today's Date: 07/26/2015 SLP Individual Time: 1415-1445 SLP Individual Time Calculation (min): 30 min  Short Term Goals: Week 1: SLP Short Term Goal 1 (Week 1): Pt will consume therapeutic trials of thin liquids with supervision cues for use of swallowing precautions and minimal overt s/s of aspiration  SLP Short Term Goal 2 (Week 1): Pt will consume dys 2 textures and nectar thick liquids with supervision cues for use of swallowing precautions and minimal overt s/s of aspiration  SLP Short Term Goal 3 (Week 1): Pt will be intelligible at the sentence level in 75% of opportunities with min assist verbal cues for pacing, overarticulation, and increased vocal intensity.  SLP Short Term Goal 4 (Week 1): Pt will sustain her attention to basic, tasks for 20 minutes with supervision verbal cues for redirection to task  SLP Short Term Goal 5 (Week 1): Pt will recall basic, daily information with supervision assist verbal cues for use of external aids.   SLP Short Term Goal 6 (Week 1): Pt will monitor and correct verbal errors in 75% of opportunities with supervision verbal cues.   Skilled Therapeutic Interventions: Skilled treatment session focused on dysphagia and speech goals. SLP facilitated session by providing trials of ice chips. PMSV was in place, pt tolerating room air with 96% O2 stats during consumption. Pt was sitting upright in bed. Pt consumed trials without overt s/s of aspiration. Pt appeared very emotional and spoke often of her family (her mother passed away several months ago), her CVA and relationship with her son. Encouragement and support given. Pt required Min A verbal cues for redirection to task instead of conversing about mother's death. Pt required Mod A verbal cues for intelligibility at the sentence level for overarticulation and increasing vocal  intensity for ~75% intelligibility at sentence level. Pt was not able to monitor or correct verbal errors.  Pt left in bed with PMSV in place with nursing present. All needs within reach. Continue current plan of care.   Function:  Eating Eating   Modified Consistency Diet: No (Trials of ice chips with ST) Eating Assist Level: Supervision or verbal cues   Eating Set Up Assist For: Applying device (includes dentures)       Cognition Comprehension Comprehension assist level: Understands basic 75 - 89% of the time/ requires cueing 10 - 24% of the time  Expression Expression assistive device: Talk trach valve Expression assist level: Expresses basic 50 - 74% of the time/requires cueing 25 - 49% of the time. Needs to repeat parts of sentences.  Social Interaction Social Interaction assist level: Interacts appropriately 90% of the time - Needs monitoring or encouragement for participation or interaction.  Problem Solving Problem solving assist level: Solves basic 75 - 89% of the time/requires cueing 10 - 24% of the time  Memory Memory assist level: Recognizes or recalls 75 - 89% of the time/requires cueing 10 - 24% of the time    Pain Pain Assessment Pain Assessment: No/denies pain  Therapy/Group: Individual Therapy  Dahlton Hinde 07/26/2015, 4:01 PM

## 2015-07-27 ENCOUNTER — Encounter (HOSPITAL_COMMUNITY): Payer: Self-pay

## 2015-07-27 ENCOUNTER — Inpatient Hospital Stay (HOSPITAL_COMMUNITY): Payer: Self-pay | Admitting: Occupational Therapy

## 2015-07-27 ENCOUNTER — Inpatient Hospital Stay (HOSPITAL_COMMUNITY): Payer: Self-pay | Admitting: Physical Therapy

## 2015-07-27 ENCOUNTER — Inpatient Hospital Stay (HOSPITAL_COMMUNITY): Payer: Self-pay | Admitting: Speech Pathology

## 2015-07-27 LAB — URINALYSIS, ROUTINE W REFLEX MICROSCOPIC
BILIRUBIN URINE: NEGATIVE
GLUCOSE, UA: NEGATIVE mg/dL
HGB URINE DIPSTICK: NEGATIVE
KETONES UR: NEGATIVE mg/dL
Nitrite: NEGATIVE
PROTEIN: NEGATIVE mg/dL
Specific Gravity, Urine: 1.017 (ref 1.005–1.030)
pH: 6.5 (ref 5.0–8.0)

## 2015-07-27 LAB — URINE MICROSCOPIC-ADD ON

## 2015-07-27 MED ORDER — PRO-STAT SUGAR FREE PO LIQD
30.0000 mL | Freq: Two times a day (BID) | ORAL | Status: DC
  Administered 2015-07-27 – 2015-08-11 (×18): 30 mL via ORAL
  Filled 2015-07-27 (×40): qty 30

## 2015-07-27 NOTE — Procedures (Signed)
Objective Swallowing Evaluation: FEES-Fiberoptic Endoscopic Evaluation of Swallow  Patient Details  Name: Gwendolyn Morales MRN: [id number] Date of Birth: [birth year]  Today's Date: 07/27/2015 Time:  -     Past Medical History:  Past Medical History  Diagnosis Date  . Hypertension    Past Surgical History: No past surgical history on file. HPI:  50 y.o. female with a history of hypertension and noncompliance with treatment brought to the ED and code stroke status following acute onset of a fall, speech difficulty and right-sided weakness. CT head showed a 1.7 x 1.3 cm acute left parietal hemorrhage and a 0.6 cm hemorrhage involving the left lateral pons. No CXR.      Recommendation/Prognosis  Clinical Impression:    Pt demonstrates sensorimotor deficits impacting timing and strength of swallow mechanism. Swallow initiation is delayed allowing nectar and thin liquids to enter airway prior to the swallow without immediate swallow response. WIthin 30-60 seconds pt consistently elicited a hard cough expelling aspirate, but could not perform an effective volitional cough or throat clear despite max cues. There were also mild diffuse residuals in the valleculae, lateral channels and vestibule.  When instructed to tuck her chin, penetration and aspiration were elimintaed with thin and nectar thick liquids with a straw; residue also improved. Recommend pt continue nectar thick liquids with a chin tuck with full supervision with PMSV in place with trials of thin with SLP for potential advancement as appropriate.     Impact on safety and function: Moderate aspiration risk   Swallow Evaluation Recommendations:   Dys 2/nectar with a chin tuck, full supervision    Prognosis:   Good   Individuals Consulted: Consulted and Agree with Results and Recommendations: Patient      SLP Assessment/Plan  Plan:  Potential to Achieve Goals (ACUTE ONLY): Good   Short Term Goals: Week 1: SLP Short  Term Goal 1 (Week 1): Pt will consume therapeutic trials of thin liquids with supervision cues for use of swallowing precautions and minimal overt s/s of aspiration  SLP Short Term Goal 2 (Week 1): Pt will consume dys 2 textures and nectar thick liquids with supervision cues for use of swallowing precautions and minimal overt s/s of aspiration  SLP Short Term Goal 3 (Week 1): Pt will be intelligible at the sentence level in 75% of opportunities with min assist verbal cues for pacing, overarticulation, and increased vocal intensity.  SLP Short Term Goal 4 (Week 1): Pt will sustain her attention to basic, tasks for 20 minutes with supervision verbal cues for redirection to task  SLP Short Term Goal 5 (Week 1): Pt will recall basic, daily information with supervision assist verbal cues for use of external aids.   SLP Short Term Goal 6 (Week 1): Pt will monitor and correct verbal errors in 75% of opportunities with supervision verbal cues.     General: Type of Study: FEES-Fiberoptic Endoscopic Evaluation of Swallow Previous Swallow Assessment: BSE 6/13; dys 2, nectar thick liquids  Diet Prior to this Study: Dysphagia 2 (chopped);Nectar-thick liquids Temperature Spikes Noted: No Trach Size and Type: Uncuffed;#6;With PMSV in place History of Recent Intubation: Yes Length of Intubations (days): 8 days Date extubated: 07/20/15 Oral Cavity - Dentition: Adequate natural dentition Self-Feeding Abilities: Able to feed self;Needs assist Baseline Vocal Quality: Low vocal intensity Volitional Cough: Cognitively unable to elicit Volitional Swallow: Able to elicit Anatomy: Within functional limits Pharyngeal Secretions: Not observed secondary MBS  Harlon DittyBonnie Anias Bartol, MA CCC-SLP [phone]     Claudine MoutonDeBlois, Jahden Schara Caroline 07/27/2015, 10:42 AM

## 2015-07-27 NOTE — Progress Notes (Signed)
Occupational Therapy Session Note  Patient Details  Name: Gwendolyn Morales MRN: [id number] Date of Birth: Jul 15, 1965  Today's Date: 07/27/2015 OT Individual Time: [id number] OT Individual Time Calculation (min): 74 min    Short Term Goals: Week 1:  OT Short Term Goal 1 (Week 1): Pt will maintain static/dynamic sittting EOC during UB dressing with supervision. OT Short Term Goal 2 (Week 1): Pt will complete LB bathing sit to stand with max assist of 1. OT Short Term Goal 3 (Week 1): Pt will complete UB dressing in unsupported sitting with supervision. OT Short Term Goal 4 (Week 1): Pt will transfer from supine to sit EOB in preparation for selfcare tasks with supervision.  OT Short Term Goal 5 (Week 1): Pt will perform squat pivot transfer to drop arm commode with max assist.   Skilled Therapeutic Interventions/Progress Updates:    Treatment session with focus on bed mobility, sitting balance, trunk control, and participation in treatment session.  Engaged in bathing and LB dressing at bed level with pt participating in washing what she could reach from semi-reclined position in bed and rolling to assist therapist with perineal hygiene.  Pt mod assist with rolling Rt and Lt, max assist to fully roll into sidelying for hygiene and clothing management.  Mod assist supine to sitting at EOB with pt demonstrating good trunk control and ability to push up through RUE into sitting.  Pt demonstrates tendency to lose balance to Rt in sitting but with good awareness and ability to correct.  Increased time to don shirt this session due to pt reporting "paranoid" and afraid of falling, to the point of threading an arm into the shirt but as reaching outside BOS to steady herself she would end up undressing that part requiring her to complete UB dressing multiple times.  Pt requested to sit upright for lunch, therefore completed transfer with use of bariatric Stedy.  Pt required max assist of 2 to facilitate  anterior weight shift to come up into standing while pulling up on Stedy bar.  Max encouragement for participation and initiation of weight shift and lift off.  Left upright in w/c with quick release belt donned and all items in reach as respiratory therapy arrived.  Monitored O2 sats throughout session with pt sats at 94% on room air.  Therapy Documentation Precautions:  Precautions Precautions: Fall Restrictions Weight Bearing Restrictions: No General:   Vital Signs: Therapy Vitals Pulse Rate: 86 Resp: 18 Patient Position (if appropriate): Lying Oxygen Therapy SpO2: 94 % O2 Device: Not Delivered Pain: Pain Assessment Pain Assessment: No/denies pain Pain Score: 0-No pain  See Function Navigator for Current Functional Status.   Therapy/Group: Individual Therapy  Rosalio LoudHOXIE, Lakethia Coppess 07/27/2015, 12:33 PM

## 2015-07-27 NOTE — Progress Notes (Signed)
Speech Language Pathology Daily Session Note  Patient Details  Name: Gwendolyn SimonSherry Barabas MRN: [id number] Date of Birth: April 24, 1965  Today's Date: 07/27/2015 SLP Individual Time: 1004-1100 SLP Individual Time Calculation (min): 56 min  Short Term Goals: Week 1: SLP Short Term Goal 1 (Week 1): Pt will consume therapeutic trials of thin liquids with supervision cues for use of swallowing precautions and minimal overt s/s of aspiration  SLP Short Term Goal 2 (Week 1): Pt will consume dys 2 textures and nectar thick liquids with supervision cues for use of swallowing precautions and minimal overt s/s of aspiration  SLP Short Term Goal 3 (Week 1): Pt will be intelligible at the sentence level in 75% of opportunities with min assist verbal cues for pacing, overarticulation, and increased vocal intensity.  SLP Short Term Goal 4 (Week 1): Pt will sustain her attention to basic, tasks for 20 minutes with supervision verbal cues for redirection to task  SLP Short Term Goal 5 (Week 1): Pt will recall basic, daily information with supervision assist verbal cues for use of external aids.   SLP Short Term Goal 6 (Week 1): Pt will monitor and correct verbal errors in 75% of opportunities with supervision verbal cues.   Skilled Therapeutic Interventions:  Pt was seen for skilled ST targeting goals for dysphagia and communication.  SLP provided skilled education regarding results of FEES and recommendation for use of chin tuck when consuming liquids to maximize airway protection.  Pt consumed trials of thin liquids via straw with min assist verbal cues for complete use of chin tuck.  No overt s/s of aspiration evident with use of compensatory strategy.  Recommend that pt remain on nectar thick liquids for now to reinforce use of chin tuck when consuming liquids.  Anticipate that pt will be able to be advanced in the next 2-3 treatment sessions with continued ST interventions for use of chin tuck.  SLP also discussed  intelligibility strategies with the use of a handout to facilitate carryover of in between therapy sessions.  Pt required mod assist verbal cues for overarticulation, pacing, and increased vocal intensity to achieve intelligibility in phrases.  Pt was left in bed with call bell within reach.  Continue per current plan of care.    Function:  Eating Eating   Modified Consistency Diet: Yes Eating Assist Level: Supervision or verbal cues           Cognition Comprehension Comprehension assist level: Follows basic conversation/direction with extra time/assistive device  Expression Expression assistive device: Talk trach valve Expression assist level: Expresses basic 50 - 74% of the time/requires cueing 25 - 49% of the time. Needs to repeat parts of sentences.  Social Interaction Social Interaction assist level: Interacts appropriately 90% of the time - Needs monitoring or encouragement for participation or interaction.  Problem Solving Problem solving assist level: Solves basic 75 - 89% of the time/requires cueing 10 - 24% of the time  Memory Memory assist level: Recognizes or recalls 75 - 89% of the time/requires cueing 10 - 24% of the time    Pain Pain Assessment Pain Assessment: No/denies pain Pain Score: 0-No pain  Therapy/Group: Individual Therapy  Sherley Leser, Melanee SpryNicole L 07/27/2015, 12:06 PM

## 2015-07-27 NOTE — Progress Notes (Signed)
Subjective/Complaints: Didn't sleep well but couldn't tell me why.  No bowel or bladder or resp issues, no pain  Review of systems negative for shortness of breath, chest pain, nausea vomiting. Positive for diarrhea  Objective: Vital Signs: Blood pressure 129/95, pulse 98, temperature 98.3 F (36.8 C), temperature source Oral, resp. rate 18, weight 111.4 kg (245 lb 9.5 oz), SpO2 94 %. No results found. Results for orders placed or performed during the hospital encounter of 07/24/15 (from the past 72 hour(s))  CBC WITH DIFFERENTIAL     Status: Abnormal   Collection Time: 07/25/15  5:08 AM  Result Value Ref Range   WBC 9.0 4.0 - 10.5 K/uL   RBC 4.45 3.87 - 5.11 MIL/uL   Hemoglobin 9.4 (L) 12.0 - 15.0 g/dL   HCT 31.3 (L) 36.0 - 46.0 %   MCV 70.3 (L) 78.0 - 100.0 fL   MCH 21.1 (L) 26.0 - 34.0 pg   MCHC 30.0 30.0 - 36.0 g/dL   RDW 19.0 (H) 11.5 - 15.5 %   Platelets 412 (H) 150 - 400 K/uL   Neutrophils Relative % 73 %   Lymphocytes Relative 10 %   Monocytes Relative 11 %   Eosinophils Relative 6 %   Basophils Relative 0 %   Neutro Abs 6.6 1.7 - 7.7 K/uL   Lymphs Abs 0.9 0.7 - 4.0 K/uL   Monocytes Absolute 1.0 0.1 - 1.0 K/uL   Eosinophils Absolute 0.5 0.0 - 0.7 K/uL   Basophils Absolute 0.0 0.0 - 0.1 K/uL   RBC Morphology POLYCHROMASIA PRESENT   Comprehensive metabolic panel     Status: Abnormal   Collection Time: 07/25/15  5:08 AM  Result Value Ref Range   Sodium 136 135 - 145 mmol/L   Potassium 3.8 3.5 - 5.1 mmol/L   Chloride 103 101 - 111 mmol/L   CO2 26 22 - 32 mmol/L   Glucose, Bld 94 65 - 99 mg/dL   BUN 15 6 - 20 mg/dL   Creatinine, Ser 0.76 0.44 - 1.00 mg/dL   Calcium 9.2 8.9 - 10.3 mg/dL   Total Protein 6.5 6.5 - 8.1 g/dL   Albumin 2.4 (L) 3.5 - 5.0 g/dL   AST 80 (H) 15 - 41 U/L   ALT 162 (H) 14 - 54 U/L   Alkaline Phosphatase 383 (H) 38 - 126 U/L   Total Bilirubin 2.5 (H) 0.3 - 1.2 mg/dL   GFR calc non Af Amer >60 >60 mL/min   GFR calc Af Amer >60 >60 mL/min     Comment: (NOTE) The eGFR has been calculated using the CKD EPI equation. This calculation has not been validated in all clinical situations. eGFR's persistently <60 mL/min signify possible Chronic Kidney Disease.    Anion gap 7 5 - 15  Magnesium     Status: None   Collection Time: 07/25/15  5:08 AM  Result Value Ref Range   Magnesium 1.8 1.7 - 2.4 mg/dL  Phosphorus     Status: None   Collection Time: 07/25/15  5:08 AM  Result Value Ref Range   Phosphorus 4.2 2.5 - 4.6 mg/dL     HEENT: #6 Shiley, well-placed, no surrounding erythema or drainage Cardio: RRR and no murmurs or extra sounds Resp: CTA B/L and unlabored GI: BS positive and non-distended nontender Extremity:  Pulses positive and No Edema Skin:   Other ttrach site CDI, IV site left arm CDI Neuro: Alert/Oriented, Cranial Nerve II-XII normal, Normal Sensory, Abnormal Motor /5 bilateral deltoid,  biceps, triceps, grip, hip flexor, knee extensor, ankle dorsiflexor and plantar flexor and Abnormal FMC Ataxic/ dec FMC Musc/Skel:  Normal Gen. no acute distress   Assessment/Plan: 1. 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 Physiatrist is providing close team supervision and 24 hour management of active medical problems listed below. Physiatrist and rehab team continue to assess barriers to discharge/monitor patient progress toward functional and medical goals. FIM: Function - Bathing Position: Sitting EOB Body parts bathed by patient: Left arm, Right arm, Chest, Abdomen, Right upper leg, Left upper leg Body parts bathed by helper: Back, Left lower leg, Right lower leg, Buttocks, Front perineal area Assist Level: 2 helpers  Function- Upper Body Dressing/Undressing What is the patient wearing?: Bra, Pull over shirt/dress Bra - Perfomed by helper:  Thread/unthread right bra strap, Thread/unthread left bra strap, Hook/unhook bra (pull down sports bra) Function - Lower Body Dressing/Undressing What is the patient wearing?: Pants Position: Wheelchair/chair at sink Pants- Performed by helper: Thread/unthread right pants leg, Thread/unthread left pants leg, Pull pants up/down, Fasten/unfasten pants Non-skid slipper socks- Performed by helper: Don/doff right sock, Don/doff left sock Assist for lower body dressing: 2 Helpers  Function - Toileting Toileting activity did not occur: No continent bowel/bladder event     Function - Chair/bed transfer Chair/bed transfer method: Other (Maxi Move) Chair/bed transfer assist level: Total assist (Pt < 25%) Chair/bed transfer assistive device: Mechanical lift Chair/bed transfer details: Visual cues for safe use of DME/AE, Verbal cues for safe use of DME/AE, Manual facilitation for placement  Function - Locomotion: Wheelchair Will patient use wheelchair at discharge?: Yes Type: Manual Max wheelchair distance: 5 Assist Level: Dependent (Pt equals 0%) Turns around,maneuvers to table,bed, and toilet,negotiates 3% grade,maneuvers on rugs and over doorsills: No Function - Locomotion: Ambulation Ambulation activity did not occur: Safety/medical concerns (unsafe to attempt due to weakness)  Function - Comprehension Comprehension: Auditory Comprehension assist level: Understands basic 75 - 89% of the time/ requires cueing 10 - 24% of the time  Function - Expression Expression: Verbal Expression assistive device: Talk trach valve Expression assist level: Expresses basic 50 - 74% of the time/requires cueing 25 - 49% of the time. Needs to repeat parts of sentences.  Function - Social Interaction Social Interaction assist level: Interacts appropriately 90% of the time - Needs monitoring or encouragement for participation or interaction.  Function - Problem Solving Problem solving assist level: Solves  basic 75 - 89% of the time/requires cueing 10 - 24% of the time  Function - Memory Memory assist level: Recognizes or recalls 75 - 89% of the time/requires cueing 10 - 24% of the time Patient normally able to recall (first 3 days only): Current season, Location of own room, Staff names and faces, That he or she is in a hospital   Medical Problem List and Plan: 1.  Functional deficits, right hemiparesis and general debility secondary to hypertensive left parietal and pontine hemorrhages with subsequent complications, Cont CIR PT, OT, SLP 2.  DVT Prophylaxis/Anticoagulation: Pharmaceutical: Lovenox 3. Pain Management:  Will try low dose ultram prn   4. Mood: team to provide ego support. LCSW to follow for evaluation.   5. Neuropsych: This patient is not capable of making decisions on her own behalf. 6. Skin/Wound Care:  Continue rectal tube for now. Routine pressure relief measures. Maintain adequate nutrition and hydration.   7. Fluids/Electrolytes/Nutrition: Monitor  I/O on current diet.  Check lytes in am. Only 25% meal intake 8. VDRF with trach: Continue CFS #6 with ATC.Plan wean to #4 early next week, touch base with CCM first 9. HTN: Continue norvasc, lisinopril, apresoline, metoprolol and HCTZ.  Monitor BP every 6 hours and titrate medications as indicated.  Filed Vitals:   07/27/15 0510 07/27/15 0535  BP:  129/95  Pulse: 84 98  Temp:  98.3 F (36.8 C)  Resp: _0 Hypokalemia: Likely due to diuretics, diarrhea and IVF. Discontinue IVF and increase oral supplement. Recheck lytes were normal yesterday   11. ABLA: Monitor for signs of bleeding. Check stool guaiacs.   12. Dysphagia: Monitor tolerance of dysphagia 2, nectar liquids. Needs full supervision at meals. Discontinue IVF.   13. Abnormal LFT:  Discontinue tylenol.? Shock liver, off tylenol, no other meds suspected of contributing 14. Anxiety: Monitor on buspar bid.  15. Diarrhea: Should improve off tube feeds. Completes  antibiotics 6/15, only 1 loose stool 16. Incontinence: recheck UA C and S 17.  Hypoalbuminemia start Prostat LOS (Days) 3 A FACE TO FACE EVALUATION WAS PERFORMED  KIRSTEINS,ANDREW E 07/27/2015, 6:53 AM

## 2015-07-27 NOTE — Progress Notes (Signed)
Physical Therapy Session Note  Patient Details  Name: Gwendolyn SimonSherry Mcfaul MRN: [id number] Date of Birth: Aug 14, 1965  Today's Date: 07/27/2015 PT Individual Time: [id number] PT Individual Time Calculation (min): 60 min   Short Term Goals: Week 1:  PT Short Term Goal 1 (Week 1): pt will move supine> sit with mod assist PT Short Term Goal 2 (Week 1): pt will transfer bed>< w/c with assist of 1 person PT Short Term Goal 3 (Week 1): pt will lock/unlock w/c brakes with 1 cue PT Short Term Goal 4 (Week 1): pt will stand x 2 minutes during functional activity  Skilled Therapeutic Interventions/Progress Updates:    Patient received semirecumbent in bed and agreeable to PT. Semirecumbent to sitting EOB with mod-max A from PT and heavy use of bed features.   Sit<>stand in stedy with max A x 5 with mod max cues for UE placement and improved push through BLE.  WC<>bed transfer in Stedy x 6 with Max A+2 for equipment management. Mod max cues provided by PT for UE positioning and improved control of eccentric motion.   Patient performed WC mobility for 15722ft with supervision A and min A x 1. Mod cues for improved use of BLE with emphasis on the increased R UE to maintain straight trajectory.   Sitting balance sitting EOB with reach to grab 9 cards followed by cross body reachs for varaiable target completed to L and R. Mod cues for improved scapular movement with cross body reach and increased trunk activation to return to sitting in midline.   Sitting reciprocal marches in cybex on 90cm/sec 4 x 1 minute bouts. Patient cued for improved BLE ROM and improved use of R LE as tolerated.   Patient returned to bed at end of treatment session with max A for sit>supine transfer. Call bell left within reach.   Therapy Documentation Precautions:  Precautions Precautions: Fall Restrictions Weight Bearing Restrictions: No General:   Vital Signs: Therapy Vitals Temp: 97.9 F (36.6 C) Temp Source: Oral Pulse  Rate: 92 Resp: 18 BP: (!) 122/92 mmHg Patient Position (if appropriate): Lying Oxygen Therapy SpO2: 96 % O2 Device: Not Delivered   See Function Navigator for Current Functional Status.   Therapy/Group: Individual Therapy  Golden Popustin E Keandre Linden 07/27/2015, 6:02 PM

## 2015-07-27 NOTE — Progress Notes (Signed)
RN was suctioning the patient and doing trach care as I assessed the patient.

## 2015-07-27 NOTE — IPOC Note (Signed)
Overall Plan of Care [facility]) Patient Details Name: Gwendolyn Morales MRN: [id number] DOB: January 13, 1966  Admitting Diagnosis: Gwendolyn Morales [facility] Problems: Principal Problem:   Cerebral parenchymal hemorrhage (HCC) Active Problems:   Tracheostomy status (HCC)   Diastolic dysfunction   Morbid obesity due to excess calories (HCC)   Dysphagia, post-stroke     Functional Problem List: Nursing Bladder, Bowel, Endurance, Medication Management, Safety, Skin Integrity, Motor, Nutrition  PT Balance, Endurance, Motor, Sensory  OT Balance, Endurance, Motor, Cognition, Pain, Sensory, Safety  SLP Cognition, Linguistic, Nutrition  TR         Basic ADL's: OT Grooming, Bathing, Dressing, Toileting     Advanced  ADL's: OT       Transfers: PT Bed Mobility, Bed to Chair, Car, Occupational psychologist, Research scientist (life sciences): PT Ambulation, Psychologist, prison and probation services, Stairs     Additional Impairments: OT Fuctional Use of Upper Extremity  SLP Swallowing, Communication, Social Cognition expression Memory, Attention, Awareness  TR      Anticipated Outcomes Item Anticipated Outcome  Self Feeding supervision  Swallowing  Mod I    Basic self-care  supervision to min assist  Toileting  min assist level   Bathroom Transfers min assist level  Bowel/Bladder  Continent bowel and bladder, no retention, anticipates needs.  Transfers  min assist  Locomotion  min assist gait x 50' and up/down 4 steps 2 rails; supervision w/c x 150'  Communication  Supervision for intelligibility at the conversational level   Cognition  Supervision   Pain  managed at goal 2/10.  Safety/Judgment  Increased safety awareness, no falls, injury this admission.   Therapy Plan: PT Intensity: Minimum of 1-2 x/day ,45 to 90 minutes PT Frequency: 5 out of 7 days PT Duration Estimated Length of Stay: 3.5-4 weeks OT Intensity: Minimum of 1-2 x/day, 45 to 90 minutes OT Duration/Estimated Length of Stay: 21-24  days SLP Intensity: Minumum of 1-2 x/day, 30 to 90 minutes SLP Frequency: 3 to 5 out of 7 days SLP Duration/Estimated Length of Stay: 21-28 days        Team Interventions: Nursing Interventions Patient/Family Education, Skin Care/Wound Management, Medication Management, Discharge Planning, Bladder Management, Bowel Management, Cognitive Remediation/Compensation, Dysphagia/Aspiration Precaution Training, Disease Management/Prevention  PT interventions Ambulation/gait training, Balance/vestibular training, Cognitive remediation/compensation, Community reintegration, Discharge planning, Neuromuscular re-education, Functional mobility training, Functional electrical stimulation, DME/adaptive equipment instruction, Patient/family education, Pain management, Psychosocial support, Splinting/orthotics, UE/LE Coordination activities, UE/LE Strength taining/ROM, Therapeutic Exercise, Therapeutic Activities, Stair training, Wheelchair propulsion/positioning  OT Interventions Balance/vestibular training, Neuromuscular re-education, Self Care/advanced ADL retraining, Therapeutic Exercise, UE/LE Strength taining/ROM, DME/adaptive equipment instruction, Cognitive remediation/compensation, Firefighter, Equities trader education, UE/LE Coordination activities, Discharge planning, Functional mobility training, Therapeutic Activities  SLP Interventions Cognitive remediation/compensation, Cueing hierarchy, Dysphagia/aspiration precaution training, Internal/external aids, Patient/family education, Functional tasks  TR Interventions    SW/CM Interventions Discharge Planning, Psychosocial Support, Patient/Family Education    Team Discharge Planning: Destination: PT-Home ,OT- Home , SLP-Home Projected Follow-up: PT-Home health PT, OT-  Home health OT, 24 hour supervision/assistance, SLP-24 hour supervision/assistance, Home Health SLP, Outpatient SLP Projected Equipment Needs: PT-To be determined, OT- Tub/shower  bench, 3 in 1 bedside comode, Wheelchair (measurements), Wheelchair cushion (measurements), SLP-To be determined Equipment Details: PT- , OT-  Patient/family involved in discharge planning: PT- Patient,  OT-Patient, SLP-Patient  MD ELOS: 20-25d Medical Rehab Prognosis:  Good Assessment: 50 y.o. female with history of morbid obesity, HTN and medical non-compliance who was admitted on 07/12/15 a fall at home with subsequent  right sided weakness and speech difficulty. History taken from chart review. BP at admission markedly elevated at 280/150 and she was started on cardene drip for control. CT head done showing acute hemorrhage 1.7 cm X 1.3 cm at left parietal lobe, small focus of acute hemorrhage left pons and scattered small vessel microangiopathy. She was noted to have intermittent hypoxia with sonorous respirations and was intubated for airway protection. CTA head/neck showed stable hemorrhage, no stenosis, AVM or aneurysm, and advanced dolichoectasia. 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 Dr. 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    Now requiring 24/7 Rehab RN,MD, as well as CIR level PT, OT and SLP.  Treatment team will focus on ADLs and mobility [facility] Team Conference Notes for weekly updates to the plan of care

## 2015-07-28 ENCOUNTER — Inpatient Hospital Stay (HOSPITAL_COMMUNITY): Payer: Self-pay | Admitting: Occupational Therapy

## 2015-07-28 ENCOUNTER — Inpatient Hospital Stay (HOSPITAL_COMMUNITY): Payer: Self-pay

## 2015-07-28 ENCOUNTER — Inpatient Hospital Stay (HOSPITAL_COMMUNITY): Payer: Self-pay | Admitting: Speech Pathology

## 2015-07-28 ENCOUNTER — Inpatient Hospital Stay (HOSPITAL_COMMUNITY): Payer: Self-pay | Admitting: Physical Therapy

## 2015-07-28 ENCOUNTER — Encounter: Payer: Self-pay | Admitting: Neurology

## 2015-07-28 NOTE — Plan of Care (Signed)
Problem: RH BLADDER ELIMINATION Goal: RH STG MANAGE BLADDER WITH ASSISTANCE STG Manage Bladder With min Assistance  Outcome: Not Progressing Requiring total assist- incontinent

## 2015-07-28 NOTE — Progress Notes (Signed)
Occupational Therapy Session Note  Patient Details  Name: Gwendolyn SimonSherry [name] MRN: [id number] Date of Birth: [birth year]  Today's Date: 07/28/2015 OT Individual Time: 1300-1400 OT Individual Time Calculation (min): 60 min    Short Term Goals: Week 1:  OT Short Term Goal 1 (Week 1): Pt will maintain static/dynamic sittting EOC during UB dressing with supervision. OT Short Term Goal 2 (Week 1): Pt will complete LB bathing sit to stand with max assist of 1. OT Short Term Goal 3 (Week 1): Pt will complete UB dressing in unsupported sitting with supervision. OT Short Term Goal 4 (Week 1): Pt will transfer from supine to sit EOB in preparation for selfcare tasks with supervision.  OT Short Term Goal 5 (Week 1): Pt will perform squat pivot transfer to drop arm commode with max assist.   Skilled Therapeutic Interventions/Progress Updates:    Pt seen for OT session focusing on fine motor coordination, UE, LE and core strengthening/ stability. Pt sitting up in w/c upon arrival, agreeable to tx session. She declined working on sitting balance, opting to work on fine motor coordination In therapy gym, pt completed fine motor task of picking up small animal figurines and turning to place them on their feet. Pt required to name all animals as she flipped them, requiring min cuing to correctly identify animals. Alternated btwn R and L UEs during task.  Completed clothes pin tree, required to recline back into w/c to obtain clothes pin, and without UE support lean forward to place clothes pin on overhead bar. Required increased time to complete task, utilizing B UEs. Impairments in R shoulder flexion noted, suspect due to weakness as pt is able to be ranged through full ROM. She completed 5 min on UE SCI Fit for UE strengthening/ conditioning. Kicked soccer ball with therapist from seated positioned focusing on LE strengthening/ control. Hamstring curls completed via foot supported on soccer ball with steadying  assist at ankle and knee. VCs provided for technique. Pt left in w/c at end of session with hand off to PT.   Therapy Documentation Precautions:  Precautions Precautions: Fall Restrictions Weight Bearing Restrictions: No Pain:    See Function Navigator for Current Functional Status.   Therapy/Group: Individual Therapy  Lewis, Lossie Kalp C 07/28/2015, 6:08 AM

## 2015-07-28 NOTE — Progress Notes (Signed)
Occupational Therapy Session Note  Patient Details  Name: Gwendolyn SimonSherry Morales MRN: [id number] Date of Birth: October 07, 1965  Today's Date: 07/28/2015 OT Individual Time: 0900-1000 OT Individual Time Calculation (min): 60 min    Short Term Goals: Week 1:  OT Short Term Goal 1 (Week 1): Pt will maintain static/dynamic sittting EOC during UB dressing with supervision. OT Short Term Goal 2 (Week 1): Pt will complete LB bathing sit to stand with max assist of 1. OT Short Term Goal 3 (Week 1): Pt will complete UB dressing in unsupported sitting with supervision. OT Short Term Goal 4 (Week 1): Pt will transfer from supine to sit EOB in preparation for selfcare tasks with supervision.  OT Short Term Goal 5 (Week 1): Pt will perform squat pivot transfer to drop arm commode with max assist.   Skilled Therapeutic Interventions/Progress Updates:    OT session focused on bed mobility, functional transfers, strengthening, and activity tolerance. Pt completed bathing and dressing from bed level, requiring max A for rolling L<>R. Completed supine>sit with max A and moderate verbal cues for sequencing. Pt demonstrated LOB to right in sitting, verbalizing awareness however unable to correct herself. Completed sit<>stand with max A+2 using 3 musketeer technique to manage dress past waist. Completed stand pivot transfer bed>w/c with max A+2 and use of 3 musketeer technique. Pt required tactile and verbal cues for upright posture and assist with positioning of LEs during transfer. Pt left sitting in w/c with QRB donned and all needs in reach.  Therapy Documentation Precautions:  Precautions Precautions: Fall Restrictions Weight Bearing Restrictions: No General:   Vital Signs:  Pain: Pain Assessment Pain Assessment: No/denies pain Pain Score: 0-No pain  See Function Navigator for Current Functional Status.   Therapy/Group: Individual Therapy  Daneil Danerkinson, Djibril Glogowski N 07/28/2015, 11:09 AM

## 2015-07-28 NOTE — Progress Notes (Signed)
Speech Language Pathology Daily Session Note  Patient Details  Name: Gwendolyn Morales MRN: [id number] Date of Birth: December 04, 1965  Today's Date: 07/28/2015 SLP Individual Time: [id number] SLP Individual Time Calculation (min): 45 min  Short Term Goals: Week 1: SLP Short Term Goal 1 (Week 1): Pt will consume therapeutic trials of thin liquids with supervision cues for use of swallowing precautions and minimal overt s/s of aspiration  SLP Short Term Goal 2 (Week 1): Pt will consume dys 2 textures and nectar thick liquids with supervision cues for use of swallowing precautions and minimal overt s/s of aspiration  SLP Short Term Goal 3 (Week 1): Pt will be intelligible at the sentence level in 75% of opportunities with min assist verbal cues for pacing, overarticulation, and increased vocal intensity.  SLP Short Term Goal 4 (Week 1): Pt will sustain her attention to basic, tasks for 20 minutes with supervision verbal cues for redirection to task  SLP Short Term Goal 5 (Week 1): Pt will recall basic, daily information with supervision assist verbal cues for use of external aids.   SLP Short Term Goal 6 (Week 1): Pt will monitor and correct verbal errors in 75% of opportunities with supervision verbal cues.   Skilled Therapeutic Interventions: Skilled treatment session focused on dysphagia and speech goals. When SLP initially entered room, speaking valve was off of trach hub and secretions on gown. Pt's airway appeared clear and no coughing present. SLP replaced speaking valve and pt clearing expressed that she had previously coughed and expelled valve and phlegm. Pt's O2 stable and voice clear. Pt able to state orientation information independently, recall swallow strategies and speech intelligibility strategies independently. SLP facilitated session by providing skilled observation of dysphagia 2 with nectar-thick liquids breakfast. Pt able to maintain slow rate and bite size but required Mod A verbal cues  for use of chin tuck. Pt required Mod A verbal cues for increasing volume during sentence level production. Pt was ~75 intelligible at the sentence level when cued with Mod A verbal cues for overarticulation and increasing vocal intensity. Speaking valve left in place, pt left in bed with safety measures in place and all needs within reach. Continue current plan of care.   Function:  Eating Eating   Modified Consistency Diet: Yes Eating Assist Level: Supervision or verbal cues   Eating Set Up Assist For: Applying device (includes dentures)       Cognition Comprehension Comprehension assist level: Follows basic conversation/direction with extra time/assistive device  Expression Expression assistive device: Talk trach valve Expression assist level: Expresses basic 50 - 74% of the time/requires cueing 25 - 49% of the time. Needs to repeat parts of sentences.  Social Interaction Social Interaction assist level: Interacts appropriately 90% of the time - Needs monitoring or encouragement for participation or interaction.  Problem Solving Problem solving assist level: Solves basic 75 - 89% of the time/requires cueing 10 - 24% of the time  Memory Memory assist level: Recognizes or recalls 75 - 89% of the time/requires cueing 10 - 24% of the time    Pain Pain Assessment Pain Assessment: No/denies pain Pain Score: 0-No pain  Therapy/Group: Individual Therapy  Gwendolyn Morales 07/28/2015, 8:34 AM

## 2015-07-28 NOTE — Progress Notes (Signed)
   Subjective/Complaints: No complaints other than bilateral leg pain which she attributes to exercise/rehab Eager to improve so that she can go home.   Objective: Vital Signs: Blood pressure 145/86, pulse 88, temperature 98.4 F (36.9 C), temperature source Oral, resp. rate 18, weight 242 lb 4.6 oz (109.9 kg), SpO2 94 %.  nad Chest cta cv- reg rate abd- soft extr- no edema    Assessment/Plan: 1. Functional deficits secondary to ataxia,,right side plus truncal and due to left pontine hemorrhagic infarct   Medical Problem List and Plan: 1.  Functional deficits, right hemiparesis and general debility secondary to hypertensive left parietal and pontine hemorrhages with subsequent complications, Cont CIR PT, OT, SLP 2.  DVT Prophylaxis/Anticoagulation: Pharmaceutical: Lovenox 3. Pain Management:  she denies significant pain 4. Mood: team to provide ego support. LCSW to follow for evaluation.   5. Neuropsych: This patient is not capable of making decisions on her own behalf. 6. Skin/Wound Care:   7. Fluids/Electrolytes/Nutrition: Monitor I/O on current diet.   Basic Metabolic Panel:    Component Value Date/Time   NA 136 07/25/2015 0508   K 3.8 07/25/2015 0508   CL 103 07/25/2015 0508   CO2 26 07/25/2015 0508   BUN 15 07/25/2015 0508   CREATININE 0.76 07/25/2015 0508   GLUCOSE 94 07/25/2015 0508   CALCIUM 9.2 07/25/2015 0508    8. VDRF with trach: Continue CFS #6 with ATC.Plan wean to #4 early next week, touch base with CCM first 9. HTN: Continue norvasc, lisinopril, apresoline, metoprolol and HCTZ.  Monitor BP every 6 hours and titrate medications as indicated. 122/92-147/88 10 Hypokalemia: resolved Basic Metabolic Panel:    Component Value Date/Time   NA 136 07/25/2015 0508   K 3.8 07/25/2015 0508   CL 103 07/25/2015 0508   CO2 26 07/25/2015 0508   BUN 15 07/25/2015 0508   CREATININE 0.76 07/25/2015 0508   GLUCOSE 94 07/25/2015 0508   CALCIUM 9.2 07/25/2015 0508     11. ABLA: Monitor for signs of bleeding.   Results for Jerre SimonRESNELL, Delonda (MRN [id number]) as of 07/28/2015 06:44  Ref. Range 07/20/2015 03:06 07/21/2015 03:25 07/22/2015 04:05 07/23/2015 03:51 07/24/2015 02:26 07/25/2015 05:08  Hemoglobin Latest Ref Range: 12.0-15.0 g/dL 78.211.0 (L) 95.611.4 (L) 21.310.3 (L) 10.3 (L) 9.4 (L) 9.4 (L)   12. Dysphagia: Monitor tolerance of dysphagia 2, nectar liquids. Needs full supervision at meals. Discontinue IVF.   13. Abnormal LFT:  Discontinue tylenol.? Shock liver, off tylenol, no other meds suspected of contributing 14. Anxiety: Monitor on buspar bid.  15. Diarrhea: Should improve off tube feeds. Completed antibiotics 6/15. 16. Incontinence: recheck UA C and S Urinalysis    Component Value Date/Time   COLORURINE YELLOW 07/27/2015 1338   APPEARANCEUR CLEAR 07/27/2015 1338   LABSPEC 1.017 07/27/2015 1338   PHURINE 6.5 07/27/2015 1338   GLUCOSEU NEGATIVE 07/27/2015 1338   HGBUR NEGATIVE 07/27/2015 1338   BILIRUBINUR NEGATIVE 07/27/2015 1338   KETONESUR NEGATIVE 07/27/2015 1338   PROTEINUR NEGATIVE 07/27/2015 1338   NITRITE NEGATIVE 07/27/2015 1338   LEUKOCYTESUR SMALL* 07/27/2015 1338   Will await culture results   17.  Hypoalbuminemia start Prostat  Results for Jerre SimonRESNELL, Nida (MRN [id number]) as of 07/28/2015 06:44  Ref. Range 07/12/2015 23:17 07/21/2015 10:16 07/22/2015 04:05 07/25/2015 05:08  Albumin Latest Ref Range: 3.5-5.0 g/dL 3.8 2.6 (L) 2.3 (L) 2.4 (L)   LOS (Days) 4 A FACE TO FACE EVALUATION WAS PERFORMED  Sonja Manseau H Wesson Stith 07/28/2015, 6:42 AM

## 2015-07-28 NOTE — Progress Notes (Signed)
Physical Therapy Session Note  Patient Details  Name: Gwendolyn Morales MRN: [id number] Date of Birth: December 03, 1965  Today's Date: 07/28/2015 PT Individual Time: 1400-1445 PT Individual Time Calculation (min): 45 min   Short Term Goals: Week 1:  PT Short Term Goal 1 (Week 1): pt will move supine> sit with mod assist PT Short Term Goal 2 (Week 1): pt will transfer bed>< w/c with assist of 1 person PT Short Term Goal 3 (Week 1): pt will lock/unlock w/c brakes with 1 cue PT Short Term Goal 4 (Week 1): pt will stand x 2 minutes during functional activity Week 2:     Skilled Therapeutic Interventions/Progress Updates:    Patient received sitting in WC in rehab gym with trade off from OT  [facility] training instructed by PT for [id number] with supervision A with min A x 3 to prevent collision with doorframes. Mod cues for improved turning technique and increased use of R UE to maintain straight trajectory,  Seated LE therex: LAQ, x 10  Marches x10  Calf raises x15 Clam shells x 15 kne Flexion/extenion on wash clothx 10 BLE  PT provided mod verbal cues for improved ROM and increased control of eccentric movement to enhance strengthening aspect of exercise. Patient able to improved eccentric control following cues   Standing frame x 5 minutes. Terminal knee extension with 10 second holds x 10 while in the standing frame.  Mod cues for improved UE placement improved control of knees. Patient reports increased fatigue following standing frame.   Patient returned to room and [facility] call bell within reach.   Therapy Documentation Precautions:  Precautions Precautions: Fall Restrictions Weight Bearing Restrictions: No General:   Vital Signs: Therapy Vitals Temp: 97.6 F (36.4 C) Temp Source: Tympanic Pulse Rate: 86 Resp: 16 BP: (!) 146/84 mmHg Patient Position (if appropriate): Sitting Oxygen Therapy SpO2: 98 % O2 Device: Not Delivered  See Function Navigator for  Current Functional Status.   Therapy/Group: Individual Therapy  Golden Popustin E Jean Skow 07/28/2015, 4:40 PM

## 2015-07-29 ENCOUNTER — Inpatient Hospital Stay (HOSPITAL_COMMUNITY): Payer: Self-pay | Admitting: Occupational Therapy

## 2015-07-29 DIAGNOSIS — N3000 Acute cystitis without hematuria: Secondary | ICD-10-CM

## 2015-07-29 LAB — URINE CULTURE: Culture: >=100000 — AB

## 2015-07-29 MED ORDER — SULFAMETHOXAZOLE-TRIMETHOPRIM 800-160 MG PO TABS
1.0000 | ORAL_TABLET | Freq: Two times a day (BID) | ORAL | Status: AC
  Administered 2015-07-29 – 2015-07-31 (×6): 1 via ORAL
  Filled 2015-07-29 (×6): qty 1

## 2015-07-29 NOTE — Progress Notes (Signed)
Occupational Therapy Session Note  Patient Details  Name: Gwendolyn Morales MRN: [id number] Date of Birth: 28-Jan-1966  Today's Date: 07/29/2015 OT Individual Time:  - 1400-1430  (30 min)      Short Term Goals: Week 1:  OT Short Term Goal 1 (Week 1): Pt will maintain static/dynamic sittting EOC during UB dressing with supervision. OT Short Term Goal 2 (Week 1): Pt will complete LB bathing sit to stand with max assist of 1. OT Short Term Goal 3 (Week 1): Pt will complete UB dressing in unsupported sitting with supervision. OT Short Term Goal 4 (Week 1): Pt will transfer from supine to sit EOB in preparation for selfcare tasks with supervision.  OT Short Term Goal 5 (Week 1): Pt will perform squat pivot transfer to drop arm commode with max assist.      Skilled Therapeutic Interventions/Progress Updates:    Skilled OT intervention for  unsupported sitting for ADL, sitting balance, Tai chi movements for UE strength and control.  Pt. Sitting in recliner.  Sat unsupported for 1 minute before going to supported sitting.  Pt, engaged in stepping and UE movements in slow pace with emphasis on achieving end range ROM and lateral reaching.   Pt tolerated session with no problems.    Therapy Documentation Precautions:  Precautions Precautions: Fall Restrictions Weight Bearing Restrictions: No    Vital Signs:  Pain:  none             See Function Navigator for Current Functional Status.   Therapy/Group: Individual Therapy  Lisa Roca 07/29/2015, 4:11 PM

## 2015-07-29 NOTE — Progress Notes (Signed)
Subjective/Complaints: No complaints this morning. She feels well. She denies bilateral leg pain. Objective: Vital Signs: Blood pressure 133/79, pulse 85, temperature 98.4 F (36.9 C), temperature source Oral, resp. rate 18, weight 239 lb 6.7 oz (108.6 kg), SpO2 98 %.  No acute distress, chest clear to auscultation, HEENT exam: Atraumatic, normocephalic Cardiac exam S1 and S2 are regular without gallops Abdominal exam active bowel sounds, soft Extremities no edema.    Assessment/Plan: 1. Functional deficits secondary to ataxia,,right side plus truncal and due to left pontine hemorrhagic infarct   Medical Problem List and Plan: 1.  Functional deficits, right hemiparesis and general debility secondary to hypertensive left parietal and pontine hemorrhages with subsequent complications, Cont CIR PT, OT, SLP 2.  DVT Prophylaxis/Anticoagulation: Pharmaceutical: Lovenox 3. Pain Management:  she denies significant pain 4. Mood: team to provide ego support. LCSW to follow for evaluation.   5. Neuropsych: This patient is not capable of making decisions on her own behalf. 6. Skin/Wound Care:   7. Fluids/Electrolytes/Nutrition: Monitor I/O on current diet.   Basic Metabolic Panel:    Component Value Date/Time   NA 136 07/25/2015 0508   K 3.8 07/25/2015 0508   CL 103 07/25/2015 0508   CO2 26 07/25/2015 0508   BUN 15 07/25/2015 0508   CREATININE 0.76 07/25/2015 0508   GLUCOSE 94 07/25/2015 0508   CALCIUM 9.2 07/25/2015 0508    8. VDRF with trach: Continue CFS #6 with ATC.Plan wean to #4 early next week, touch base with CCM first 9. HTN: Continue norvasc, lisinopril, apresoline, metoprolol and HCTZ.  Monitor BP every 6 hours and titrate medications as indicated. [id number]/[id number]/84 10 Hypokalemia: resolved Basic Metabolic Panel:    Component Value Date/Time   NA 136 07/25/2015 0508   K 3.8 07/25/2015 0508   CL 103 07/25/2015 0508   CO2 26 07/25/2015 0508   BUN 15 07/25/2015 0508   CREATININE 0.76 07/25/2015 0508   GLUCOSE 94 07/25/2015 0508   CALCIUM 9.2 07/25/2015 0508    11. ABLA: Monitor for signs of bleeding.   Results for Gwendolyn Morales, Gwendolyn Morales (MRN [id number]) as of 07/28/2015 06:44  Ref. Range 07/20/2015 03:06 07/21/2015 03:25 07/22/2015 04:05 07/23/2015 03:51 07/24/2015 02:26 07/25/2015 05:08  Hemoglobin Latest Ref Range: 12.0-15.0 g/dL 29.511.0 (L) 28.411.4 (L) 13.210.3 (L) 10.3 (L) 9.4 (L) 9.4 (L)   12. Dysphagia: Monitor tolerance of dysphagia 2, nectar liquids. Needs full supervision at meals. Discontinue IVF.   13. Abnormal LFT:  Discontinue tylenol.?  Lab Results  Component Value Date   ALT 162* 07/25/2015   AST 80* 07/25/2015   ALKPHOS 383* 07/25/2015   BILITOT 2.5* 07/25/2015   14. Anxiety: Monitor on buspar bid.  15. Diarrhea: Improved 16. Incontinence:  Urinalysis    Component Value Date/Time   COLORURINE YELLOW 07/27/2015 1338   APPEARANCEUR CLEAR 07/27/2015 1338   LABSPEC 1.017 07/27/2015 1338   PHURINE 6.5 07/27/2015 1338   GLUCOSEU NEGATIVE 07/27/2015 1338   HGBUR NEGATIVE 07/27/2015 1338   BILIRUBINUR NEGATIVE 07/27/2015 1338   KETONESUR NEGATIVE 07/27/2015 1338   PROTEINUR NEGATIVE 07/27/2015 1338   NITRITE NEGATIVE 07/27/2015 1338   LEUKOCYTESUR SMALL* 07/27/2015 1338   She does have a urinary tract infection. Klebsiella pneumoniae She has no known drug allergies and the Klebsiella is sensitive to Septra. Will treat for 3 days.  17.  Hypoalbuminemia start Prostat  Results for Gwendolyn Morales, Gwendolyn Morales (MRN [id number]) as of 07/28/2015 06:44  Ref. Range 07/12/2015 23:17 07/21/2015 10:16 07/22/2015 04:05 07/25/2015 05:08  Albumin Latest Ref  Range: 3.5-5.0 g/dL 3.8 2.6 (L) 2.3 (L) 2.4 (L)   LOS (Days) 5 A FACE TO FACE EVALUATION WAS PERFORMED  Bruce H Swords 07/29/2015, 8:13 AM

## 2015-07-30 ENCOUNTER — Inpatient Hospital Stay (HOSPITAL_COMMUNITY): Payer: Self-pay

## 2015-07-30 ENCOUNTER — Inpatient Hospital Stay (HOSPITAL_COMMUNITY): Payer: Self-pay | Admitting: Speech Pathology

## 2015-07-30 ENCOUNTER — Inpatient Hospital Stay (HOSPITAL_COMMUNITY): Payer: Self-pay | Admitting: Occupational Therapy

## 2015-07-30 NOTE — Progress Notes (Signed)
Recreational Morales Assessment and Plan  Patient Details  Name: Gwendolyn Morales MRN: [id number] Date of Birth: [birth year] Today's Date: 07/30/2015  Rehab Potential: Good ELOS: 3-4 weeks   Assessment Clinical Impression:  Problem List:  Patient Active Problem List   Diagnosis Date Noted  . Cerebral parenchymal hemorrhage ([place]) 07/24/2015  . 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 Elevated bilirubin   . Adjustment disorder with mixed anxiety and depressed mood   . Diastolic dysfunction   . Morbid obesity due to excess calories ([place])   . H/O noncompliance with medical treatment, presenting hazards to health   . Tachypnea   . Acute blood loss anemia   . Dysphagia, post-stroke   . HTN (hypertension)   . Productive cough   . Tracheostomy status ([place])   . Nontraumatic cortical hemorrhage of left cerebral hemisphere ([place])   . Hypokalemia   . Azotemia   . Acute respiratory failure ([place])   . Acute respiratory failure with hypoxemia ([place])   . Cytotoxic brain edema ([place]) 07/13/2015  . 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 ICH (intracerebral hemorrhage) ([place]) 07/12/2015    Past Medical History:  Past Medical History  Diagnosis Date  . Hypertension    Past Surgical History: No past surgical history on file.  Assessment & Plan Clinical Impression: Gwendolyn Morales is a 50 y.o. female with history of morbid obesity, HTN and medical non-compliance who was admitted on 07/12/15 a fall at home with subsequent right sided weakness and speech difficulty. History taken from chart review. BP at admission markedly elevated at 280/150 and she was started on cardene drip for control. CT head done showing acute hemorrhage 1.7 cm X 1.3 cm at left parietal lobe, small focus of acute hemorrhage left pons and scattered small vessel microangiopathy. She was noted to have intermittent hypoxia with  sonorous respirations and was intubated for airway protection. CTA head/neck showed stable hemorrhage, no stenosis, AVM or aneurysm, and advanced dolichoectasia. 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 Dr. 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 To consider LP or cerebral angio if indicated.   Patient continues to have copious oral secretions with UE edema. She was started on IV Vanc/Zosyn for treatment of aspiration PNA. Blood pressure difficulty to control and renal ultrasound done revealing minimal flow in right RA with smaller right kidney. 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 Blood pressures improving therefore they signed off 06/09. 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 She was weaned to ATC on 06/10 and vasculitis panel with elevated ESR, CRP and RF otherwise negative. She continues to have issues with anxiety requiring propofol and was transitioned to busparl. Patient transferred to CIR on 07/24/2015.      Pt presents with decreased activity tolerance, decreased functional mobility, decreased oxygen support, decreased balance, decreased coordination, decreased awareness, decreased memory, decreased problem solving Limiting pt's independence with leisure/community pursuits.   Leisure History/Participation Premorbid leisure interest/current participation: Gwendolyn Morales care;Sports - [place];Community - Travel (Comment) Other Leisure Interests: Television;Movies;Cooking/Baking;Housework Leisure Participation Style: Alone;With  Family/Friends Awareness of Community Resources: Excellent Psychosocial / Spiritual Patient agreeable to Gwendolyn Morales: Yes  Does patient have pets?: Yes (son has a rabbit) Social interaction - Mood/Behavior: Cooperative Engineer, drilling for Education?: Yes Recreational Morales Orientation Orientation -Reviewed with patient: Available activity resources Strengths/Weaknesses Patient Strengths/Abilities: Willingness to participate;Active premorbidly Patient weaknesses: Physical limitations TR Patient demonstrates impairments in the following area(s): Endurance;Motor;Safety;Skin Integrity  Plan Rec Morales Plan Is patient appropriate for Therapeutic Recreation?: Yes Rehab Potential: Good Treatment times per week: Min 1 time per week >20 minutes Estimated Length of Stay: 3-4 weeks TR Treatment/Interventions: Adaptive equipment instruction;Community reintegration;1:1 session;Balance/vestibular training;Functional mobility training;Cognitive remediation/compensation;Patient/family education;Therapeutic activities;Recreation/leisure participation;Therapeutic exercise;UE/LE Coordination activities;Wheelchair propulsion/positioning  Recommendations for other services: Neuropsych  Discharge Criteria: Patient will be discharged from TR if patient refuses treatment 3 consecutive times without medical reason.  If treatment goals not met, if there is a change in medical status, if patient makes no progress towards goals or if patient is discharged from hospital.  The above assessment, treatment plan, treatment alternatives and goals were discussed and mutually agreed upon: by patient  [place] 07/30/2015, 3:06 PM

## 2015-07-30 NOTE — Progress Notes (Signed)
Trach secure. Pt in no distress

## 2015-07-30 NOTE — Progress Notes (Signed)
Occupational Therapy Session Note  Patient Details  Name: Gwendolyn SimonSherry Morales MRN: [id number] Date of Birth: August 08, 1965  Today's Date: 07/30/2015 OT Individual Time: 1002-1058 OT Individual Time Calculation (min): 56 min    Short Term Goals: Week 1:  OT Short Term Goal 1 (Week 1): Pt will maintain static/dynamic sittting EOC during UB dressing with supervision. OT Short Term Goal 2 (Week 1): Pt will complete LB bathing sit to stand with max assist of 1. OT Short Term Goal 3 (Week 1): Pt will complete UB dressing in unsupported sitting with supervision. OT Short Term Goal 4 (Week 1): Pt will transfer from supine to sit EOB in preparation for selfcare tasks with supervision.  OT Short Term Goal 5 (Week 1): Pt will perform squat pivot transfer to drop arm commode with max assist.   Skilled Therapeutic Interventions/Progress Updates:    Pt completed bathing and dressing sit to stand on the EOB.  Min assist for initial sitting balance statically but progressed to supervision during bathing tasks.  Mod assist needed for crossing LEs over the opposite knee for washing lower legs and starting all clothing.  Total +2 (pt 50%) for sit to stand in order to pull pants over hips.  She needed to transition to supine for therapist to assist with fastening them however.  She used the sliding board for transfer to the wheelchair with mod assist and max demonstrational cueing for hand placement.  She still wants to try and grab the therapist in front of her during transfers instead of pushing off of the surface she is sitting on.    Therapy Documentation Precautions:  Precautions Precautions: Fall Restrictions Weight Bearing Restrictions: No  Pain: Pain Assessment Pain Assessment: No/denies pain ADL: See Function Navigator for Current Functional Status.   Therapy/Group: Individual Therapy  Lakashia Collison OTR/L 07/30/2015, 12:39 PM

## 2015-07-30 NOTE — Plan of Care (Signed)
Problem: RH KNOWLEDGE DEFICIT Goal: RH STG INCREASE KNOWLEDGE OF HYPERTENSION Patient and caregiver able to verbalize understanding s/s of hypo/hypertension, and demonstrates understanding of medications, treatment, and diet prescribed by MD with minimal assistance.  Outcome: Progressing Reviewed daily medications

## 2015-07-30 NOTE — Progress Notes (Signed)
Speech Language Pathology Daily Session Note  Patient Details  Name: Gwendolyn SimonSherry Morales MRN: [id number] Date of Birth: April 19, 1965  Today's Date: 07/30/2015 SLP Individual Time: 0903-1000 SLP Individual Time Calculation (min): 57 min  Short Term Goals: Week 1: SLP Short Term Goal 1 (Week 1): Pt will consume therapeutic trials of thin liquids with supervision cues for use of swallowing precautions and minimal overt s/s of aspiration  SLP Short Term Goal 2 (Week 1): Pt will consume dys 2 textures and nectar thick liquids with supervision cues for use of swallowing precautions and minimal overt s/s of aspiration  SLP Short Term Goal 3 (Week 1): Pt will be intelligible at the sentence level in 75% of opportunities with min assist verbal cues for pacing, overarticulation, and increased vocal intensity.  SLP Short Term Goal 4 (Week 1): Pt will sustain her attention to basic, tasks for 20 minutes with supervision verbal cues for redirection to task  SLP Short Term Goal 5 (Week 1): Pt will recall basic, daily information with supervision assist verbal cues for use of external aids.   SLP Short Term Goal 6 (Week 1): Pt will monitor and correct verbal errors in 75% of opportunities with supervision verbal cues.   Skilled Therapeutic Interventions:  Pt was seen for skilled ST targeting goals for communication and dysphagia.  Pt consumed trials of regular, unthickened water with min assist verbal cues for timing and coordination of chin tuck.  No overt s/s of aspiration were evident with thin liquids; however, trials were limited due to pt preference (pt stating that she has been having increased coughing since trach care yesterday and was concerned the cold water would elicit a coughing episode).  Pt was intelligible at the phrase level with overall supervision verbal cues for overarticulation, pacing, and increased vocal intensity during a structured verbal description task.  Min assist verbal cues needed for  sentence level intelligibility in less structured contexts.  Pt left in bed with bed alarm set and call bell within reach.  Continue per current plan of care.    Function:  Eating Eating   Modified Consistency Diet: Yes Eating Assist Level: Supervision or verbal cues   Eating Set Up Assist For: Applying device (includes dentures) (pmv in place)       Cognition Comprehension Comprehension assist level: Understands complex 90% of the time/cues 10% of the time  Expression Expression assistive device: Talk trach valve Expression assist level: Expresses basic 75 - 89% of the time/requires cueing 10 - 24% of the time. Needs helper to occlude trach/needs to repeat words.  Social Interaction Social Interaction assist level: Interacts appropriately 90% of the time - Needs monitoring or encouragement for participation or interaction.  Problem Solving Problem solving assist level: Solves basic 75 - 89% of the time/requires cueing 10 - 24% of the time  Memory Memory assist level: Recognizes or recalls 75 - 89% of the time/requires cueing 10 - 24% of the time    Pain Pain Assessment Pain Assessment: No/denies pain  Therapy/Group: Individual Therapy  Tyshon Fanning, Melanee SpryNicole L 07/30/2015, 12:24 PM

## 2015-07-30 NOTE — Progress Notes (Signed)
Occupational Therapy Session Note  Patient Details  Name: Gwendolyn Morales MRN: [id number] Date of Birth: 19-Jun-1965  Today's Date: 07/30/2015 OT Individual Time: 1302-1350 OT Individual Time Calculation (min): 48 min    Short Term Goals: Week 1:  OT Short Term Goal 1 (Week 1): Pt will maintain static/dynamic sittting EOC during UB dressing with supervision. OT Short Term Goal 2 (Week 1): Pt will complete LB bathing sit to stand with max assist of 1. OT Short Term Goal 3 (Week 1): Pt will complete UB dressing in unsupported sitting with supervision. OT Short Term Goal 4 (Week 1): Pt will transfer from supine to sit EOB in preparation for selfcare tasks with supervision.  OT Short Term Goal 5 (Week 1): Pt will perform squat pivot transfer to drop arm commode with max assist.   Skilled Therapeutic Interventions/Progress Updates:    Treatment session with focus on dynamic sitting balance, trunk control, and transfers.  Completed slide board transfer w/c > therapy mat with therapist setting up slide board and then providing mod assist with lateral scoot transfer.  Manual facilitation for anterior weight shift and encouragement.  Engaged in lateral leans and weight shifting outside BOS and towards floor to obtain items, incorporating crossing midline to simulate LB dressing and obtaining items from seated level.  Min guard during reaching, pt with no LOB this session during seated tasks.  Increased challenge to sitting balance with raising mat to eliminate stability from feet on floor to further challenge sitting balance while tossing ball to 2nd person.  Therapist provided constant contact guard throughout task, again with no LOB.  Performed squat pivot transfer back to w/c with +2 for safety but pt completing approx 50% of task, noting improved anterior weight shift and lift off of buttocks during transfer.  Therapy Documentation Precautions:  Precautions Precautions: Fall Restrictions Weight  Bearing Restrictions: No General:   Vital Signs: Therapy Vitals Pulse Rate: 86 Resp: 16 BP: 112/70 mmHg Patient Position (if appropriate): Sitting Oxygen Therapy SpO2: 96 % O2 Device: Not Delivered Pain: Pain Assessment Pain Assessment: No/denies pain  See Function Navigator for Current Functional Status.   Therapy/Group: Individual Therapy  Rosalio LoudHOXIE, Yareliz Thorstenson 07/30/2015, 3:11 PM

## 2015-07-30 NOTE — Progress Notes (Signed)
Subjective/Complaints: Burning with urination, no sweat or chills.  Aphasic with word sub such as bladder pain when "eating"  Review of systems negative for shortness of breath, chest pain, nausea vomiting.   Objective: Vital Signs: Blood pressure 124/77, pulse 75, temperature 99.1 F (37.3 C), temperature source Oral, resp. rate 18, weight 106 kg (233 lb 11 oz), SpO2 96 %. No results found. Results for orders placed or performed during the hospital encounter of 07/24/15 (from the past 72 hour(s))  Urine culture     Status: Abnormal   Collection Time: 07/27/15  1:38 PM  Result Value Ref Range   Specimen Description URINE, CLEAN CATCH    Special Requests NONE    Culture >=100,000 COLONIES/mL KLEBSIELLA PNEUMONIAE (A)    Report Status 07/29/2015 FINAL    Organism ID, Bacteria KLEBSIELLA PNEUMONIAE (A)       Susceptibility   Klebsiella pneumoniae - MIC*    AMPICILLIN >=32 RESISTANT Resistant     CEFAZOLIN >=64 RESISTANT Resistant     CEFTRIAXONE <=1 SENSITIVE Sensitive     CIPROFLOXACIN <=0.25 SENSITIVE Sensitive     GENTAMICIN <=1 SENSITIVE Sensitive     IMIPENEM <=0.25 SENSITIVE Sensitive     NITROFURANTOIN 64 INTERMEDIATE Intermediate     TRIMETH/SULFA <=20 SENSITIVE Sensitive     AMPICILLIN/SULBACTAM >=32 RESISTANT Resistant     PIP/TAZO 64 INTERMEDIATE Intermediate     * >=100,000 COLONIES/mL KLEBSIELLA PNEUMONIAE  Urinalysis, Routine w reflex microscopic ([facility])     Status: Abnormal   Collection Time: 07/27/15  1:38 PM  Result Value Ref Range   Color, Urine YELLOW YELLOW   APPearance CLEAR CLEAR   Specific Gravity, Urine 1.017 1.005 - 1.030   pH 6.5 5.0 - 8.0   Glucose, UA NEGATIVE NEGATIVE mg/dL   Hgb urine dipstick NEGATIVE NEGATIVE   Bilirubin Urine NEGATIVE NEGATIVE   Ketones, ur NEGATIVE NEGATIVE mg/dL   Protein, ur NEGATIVE NEGATIVE mg/dL   Nitrite NEGATIVE NEGATIVE   Leukocytes, UA SMALL (A) NEGATIVE  Urine microscopic-add on     Status: Abnormal   Collection Time: 07/27/15  1:38 PM  Result Value Ref Range   Squamous Epithelial / LPF 0-5 (A) NONE SEEN   WBC, UA 0-5 0 - 5 WBC/hpf   RBC / HPF 0-5 0 - 5 RBC/hpf   Bacteria, UA RARE (A) NONE SEEN   Urine-Other YEAST PRESENT      HEENT: #6 Shiley, well-placed, no surrounding erythema or drainage Cardio: RRR and no murmurs or extra sounds Resp: CTA B/L and unlabored GI: BS positive and non-distended nontender Extremity:  Pulses positive and No Edema Skin:   Other ttrach site CDI, IV site left arm CDI Neuro: Alert/Oriented, Cranial Nerve II-XII normal, Normal Sensory, Abnormal Motor /5 bilateral deltoid, biceps, triceps, grip, hip flexor, knee extensor, ankle dorsiflexor and plantar flexor and Abnormal FMC Ataxic/ dec [facility]MC Musc/Skel:  Normal Gen. no acute distress   Assessment/Plan: 1. 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 Physiatrist is providing close team supervision and 24 hour management of active medical problems listed below. Physiatrist and rehab team continue to assess barriers to discharge/monitor patient progress toward functional and medical goals. FIM: Function - Bathing Position: Bed Body parts bathed by patient: Left arm, Right arm, Chest, Abdomen, Right upper leg, Left upper leg Body parts bathed by helper: Left lower leg, Right  lower leg, Buttocks, Front perineal area Assist Level:  (max assist)  Function- Upper Body Dressing/Undressing What is the patient wearing?: Pull over shirt/dress Bra - Perfomed by helper: Thread/unthread right bra strap, Thread/unthread left bra strap, Hook/unhook bra (pull down sports bra) Pull over shirt/dress - Perfomed by patient: Thread/unthread left sleeve, Put head through opening Pull over shirt/dress - Perfomed by helper: Thread/unthread right sleeve, Pull shirt  over trunk Assist Level:  (mod assist) Function - Lower Body Dressing/Undressing What is the patient wearing?: Non-skid slipper socks Position: Bed Pants- Performed by helper: Thread/unthread right pants leg, Thread/unthread left pants leg, Pull pants up/down Non-skid slipper socks- Performed by helper: Don/doff right sock, Don/doff left sock Assist for footwear: Dependant Assist for lower body dressing: 2 Helpers  Function - Toileting Toileting activity did not occur: No continent bowel/bladder event Toileting steps completed by helper: Adjust clothing prior to toileting, Performs perineal hygiene, Adjust clothing after toileting Assist level: Two helpers     Function - Chair/bed transfer Chair/bed transfer method: Stand pivot Chair/bed transfer assist level: 2 helpers Chair/bed transfer assistive device: Mechanical lift Mechanical lift: Stedy Chair/bed transfer details: Manual facilitation for weight shifting, Manual facilitation for placement, Manual facilitation for weight bearing, Verbal cues for safe use of DME/AE, Verbal cues for precautions/safety, Verbal cues for technique, Verbal cues for sequencing, Tactile cues for weight shifting, Tactile cues for posture  Function - Locomotion: Wheelchair Will patient use wheelchair at discharge?: Yes Type: Manual Max wheelchair distance: 210ft Assist Level: Touching or steadying assistance (Pt > 75%) Assist Level: Supervision or verbal cues Assist Level: Touching or steadying assistance (Pt > 75%) Turns around,maneuvers to table,bed, and toilet,negotiates 3% grade,maneuvers on rugs and over doorsills: No Function - Locomotion: Ambulation Ambulation activity did not occur: Safety/medical concerns (unsafe to attempt due to weakness)  Function - Comprehension Comprehension: Auditory Comprehension assist level: Understands complex 90% of the time/cues 10% of the time  Function - Expression Expression: Verbal Expression assistive  device: Talk trach valve Expression assist level: Expresses basic 75 - 89% of the time/requires cueing 10 - 24% of the time. Needs helper to occlude trach/needs to repeat words.  Function - Social Interaction Social Interaction assist level: Interacts appropriately 90% of the time - Needs monitoring or encouragement for participation or interaction.  Function - Problem Solving Problem solving assist level: Solves complex 90% of the time/cues < 10% of the time  Function - Memory Memory assist level: Recognizes or recalls 90% of the time/requires cueing < 10% of the time Patient normally able to recall (first 3 days only): Current season   Medical Problem List and Plan: 1.  Functional deficits, right hemiparesis and general debility secondary to hypertensive left parietal and pontine hemorrhages with subsequent complications, Cont CIR PT, OT, SLP 2.  DVT Prophylaxis/Anticoagulation: Pharmaceutical: Lovenox 3. Pain Management:  Will try low dose ultram prn   4. Mood: team to provide ego support. LCSW to follow for evaluation.   5. Neuropsych: This patient is not capable of making decisions on her own behalf. 6. Skin/Wound Care:  Continue rectal tube for now. Routine pressure relief measures. Maintain adequate nutrition and hydration.   7. Fluids/Electrolytes/Nutrition: Monitor I/O on current diet.  Check lytes in am.  20-75% meal intake 8. VDRF with trach: Continue CFS #6 with ATC.Plan wean to #4 , will ask CCM to eval 9. HTN: Continue norvasc, lisinopril, apresoline, metoprolol and HCTZ.  Monitor BP every 6 hours and titrate medications as indicated.  Filed Vitals:  07/30/15 0311 07/30/15 0512  BP:  124/77  Pulse: 82 75  Temp:  99.1 F (37.3 C)  Resp: Hypokalemia: Likely due to diuretics, diarrhea and IVF. Discontinue IVF and increase oral supplement. Recheck lytes were normal yesterday   11. ABLA: Monitor for signs of bleeding. Check stool guaiacs.   12. Dysphagia:  Monitor tolerance of dysphagia 2, nectar liquids. Needs full supervision at meals. Discontinue IVF.   13. Abnormal LFT:  Discontinue tylenol.? Shock liver, off tylenol, no other meds suspected of contributing 14. Anxiety: Monitor on buspar bid.  15. Diarrhea: improved , last BM 6/17 16. Incontinence: UTI K. Pneumoniae, S to Bactrim   17.  Hypoalbuminemia start Prostat LOS (Days) 6 A FACE TO FACE EVALUATION WAS PERFORMED  Florine Sprenkle E 07/30/2015, 7:19 AM

## 2015-07-30 NOTE — Progress Notes (Addendum)
Physical Therapy Note  Patient Details  Name: Jerre SimonSherry Pacella MRN: [id number] Date of Birth: [birth year] Today's Date: 07/30/2015  1500-1600, 60 min individual tx Pain: none reported  Pt propelled w/ c using bil UEs on unit; supervision for brake management.   VCS and manual cues for increased efficiency on straight path and turns.  Simulated car transfer to level seat>< w/c.  Switched w/c from recliner w/c to regular w/c; pt feels comfortable in it.  O2 sats on room air stayed above 90% throughout session; see vitals. Sit> stand in parallel bars x 1 minute x 2 with lateral L><R wt shifting> low marching, mod/max assist for control; pt over-shifts to R and has difficulty shifting to L.  neuromuscular re-education via demo, vcs for reciprocal scooting forward/backward in w/c for pelvic dissociation and core activation; in standing for R/L knee control with wt bearing.    Pt left resting in w/c with quick release belt applied and all needs within reach. Another quick release belt strapped around upright sections of foot rests to hold in place (not LEs) as pt's excessive RLE extension overpowers locking mechanism of foot rest.  Adabelle Griffiths 07/30/2015, 4:21 PM

## 2015-07-31 ENCOUNTER — Inpatient Hospital Stay (HOSPITAL_COMMUNITY): Payer: Self-pay | Admitting: Speech Pathology

## 2015-07-31 ENCOUNTER — Inpatient Hospital Stay (HOSPITAL_COMMUNITY): Payer: Self-pay | Admitting: Occupational Therapy

## 2015-07-31 ENCOUNTER — Ambulatory Visit (HOSPITAL_COMMUNITY): Payer: Self-pay | Admitting: *Deleted

## 2015-07-31 LAB — CREATININE, SERUM
CREATININE: 2.01 mg/dL — AB (ref 0.44–1.00)
GFR calc Af Amer: 32 mL/min — ABNORMAL LOW (ref >60)
GFR calc non Af Amer: 28 mL/min — ABNORMAL LOW (ref >60)

## 2015-07-31 MED ORDER — SODIUM CHLORIDE 0.45 % IV SOLN
INTRAVENOUS | Status: DC
  Administered 2015-07-31: 75 mL/h via INTRAVENOUS
  Administered 2015-08-01 – 2015-08-04 (×4): via INTRAVENOUS

## 2015-07-31 NOTE — Progress Notes (Signed)
Speech Language Pathology Daily Session Note  Patient Details  Name: Gwendolyn SimonSherry Fraticelli MRN: [id number] Date of Birth: August 20, 1965  Today's Date: 07/31/2015 SLP Individual Time: 1300-1330 SLP Individual Time Calculation (min): 30 min  Short Term Goals: Week 1: SLP Short Term Goal 1 (Week 1): Pt will consume therapeutic trials of thin liquids with supervision cues for use of swallowing precautions and minimal overt s/s of aspiration  SLP Short Term Goal 2 (Week 1): Pt will consume dys 2 textures and nectar thick liquids with supervision cues for use of swallowing precautions and minimal overt s/s of aspiration  SLP Short Term Goal 3 (Week 1): Pt will be intelligible at the sentence level in 75% of opportunities with min assist verbal cues for pacing, overarticulation, and increased vocal intensity.  SLP Short Term Goal 4 (Week 1): Pt will sustain her attention to basic, tasks for 20 minutes with supervision verbal cues for redirection to task  SLP Short Term Goal 5 (Week 1): Pt will recall basic, daily information with supervision assist verbal cues for use of external aids.   SLP Short Term Goal 6 (Week 1): Pt will monitor and correct verbal errors in 75% of opportunities with supervision verbal cues.   Skilled Therapeutic Interventions: Skilled treatment session focused on addressing diagnostic treatment of cognition.  SLP facilitated session by administering the [facility], MoCA version 7.1.  Patient demonstrated skills consistent with 19/30 with 26 or greater being considered to be within normal limits.  Deficits in executive functioning, math calculations, naming fluency, and delayed recall were noted.  Continue with current plan of care.    Function:  Cognition Comprehension Comprehension assist level: Understands complex 90% of the time/cues 10% of the time  Expression Expression assistive device: Talk trach valve Expression assist level: Expresses basic 90% of the  time/requires cueing < 10% of the time.  Social Interaction Social Interaction assist level: Interacts appropriately 90% of the time - Needs monitoring or encouragement for participation or interaction.  Problem Solving Problem solving assist level: Solves basic problems with no assist  Memory Memory assist level: Recognizes or recalls 50 - 74% of the time/requires cueing 25 - 49% of the time    Pain Pain Assessment Pain Assessment: No/denies pain  Therapy/Group: Individual Therapy  Charlane FerrettiMelissa Saraann Enneking, M.A., CCC-SLP [phone]  Delores Edelstein 07/31/2015, 3:05 PM

## 2015-07-31 NOTE — Progress Notes (Signed)
Occupational Therapy Session Note  Patient Details  Name: Gwendolyn Morales MRN: [id number] Date of Birth: [birth year]  Today's Date: 07/31/2015 OT Individual Time: 0902-1000 OT Individual Time Calculation (min): 58 min    Short Term Goals: Week 1:  OT Short Term Goal 1 (Week 1): Pt will maintain static/dynamic sittting EOC during UB dressing with supervision. OT Short Term Goal 2 (Week 1): Pt will complete LB bathing sit to stand with max assist of 1. OT Short Term Goal 3 (Week 1): Pt will complete UB dressing in unsupported sitting with supervision. OT Short Term Goal 4 (Week 1): Pt will transfer from supine to sit EOB in preparation for selfcare tasks with supervision.  OT Short Term Goal 5 (Week 1): Pt will perform squat pivot transfer to drop arm commode with max assist.   Skilled Therapeutic Interventions/Progress Updates:    Pt completed bathing and dressing sit to stand at the sink.  Mod assist for crossing LEs over the opposite knee for bathing and dressing tasks.  Once crossed over she is able to maintain them and complete the task of washing or dressing.  Max assist for sit to stand from the wheelchair for pulling brief and pants over hips, with mod assist for threading the LLE.  She demonstrates decreased hip extension in standing as well as decreased weightshift to the left in standing as well.  Increased lean to the right in standing with trunk flexion present.  Pt completed multiple sit to stands with standing endurance for 1 min to 2 mins with mod assist with BUE support on the sink.  Pt still needing mod demonstrational cueing for hand placement with sit to stand as she tends to want to pull on the sink instead of pushing up from the wheelchair arm rests.  Pt left in wheelchair at end of session with safety belt and call button in place.    Therapy Documentation Precautions:  Precautions Precautions: Fall Restrictions Weight Bearing Restrictions: No  Pain: Pain  Assessment Pain Assessment: No/denies pain ADL: See Function Navigator for Current Functional Status.   Therapy/Group: Individual Therapy  Conya Ellinwood OTR/L 07/31/2015, 12:28 PM

## 2015-07-31 NOTE — Progress Notes (Signed)
Follow-up creatinine this morning 2.01. Hold lisinopril follow-up chemistries in a.m.

## 2015-07-31 NOTE — Progress Notes (Signed)
Speech Language Pathology Daily Session Note  Patient Details  Name: Gwendolyn Morales MRN: [id number] Date of Birth: May 03, 1965  Today's Date: 07/31/2015 SLP Individual Time: 1102-1200 SLP Individual Time Calculation (min): 58 min  Short Term Goals: Week 1: SLP Short Term Goal 1 (Week 1): Pt will consume therapeutic trials of thin liquids with supervision cues for use of swallowing precautions and minimal overt s/s of aspiration  SLP Short Term Goal 2 (Week 1): Pt will consume dys 2 textures and nectar thick liquids with supervision cues for use of swallowing precautions and minimal overt s/s of aspiration  SLP Short Term Goal 3 (Week 1): Pt will be intelligible at the sentence level in 75% of opportunities with min assist verbal cues for pacing, overarticulation, and increased vocal intensity.  SLP Short Term Goal 4 (Week 1): Pt will sustain her attention to basic, tasks for 20 minutes with supervision verbal cues for redirection to task  SLP Short Term Goal 5 (Week 1): Pt will recall basic, daily information with supervision assist verbal cues for use of external aids.   SLP Short Term Goal 6 (Week 1): Pt will monitor and correct verbal errors in 75% of opportunities with supervision verbal cues.   Skilled Therapeutic Interventions:  Pt was seen for skilled ST targeting goals for dysphagia and communication.  SLP facilitated the session with trials of thin liquids during lunch meal to continue working towards diet progression.  Pt able to utilize chin tuck with liquids alone with supervision verbal cues; however, when alternating between solids and liquids, pt required up to mod-max assist verbal cues to sequence timing of chin tuck with swallow initiation and demonstrated consistent immediate, reflexive coughing.  S/s of aspiration were mitigated with nectar thick liquids and pt demonstrated more timely coordination of chin tuck, which SLP suspects to be related to slower transit of boluses  allowing pt extra time to achieve posture.  Recommend that pt remain on nectar thick liquids with full supervision for use of swallowing precautions.  SLP also facilitated the session with categorical naming task targeting intelligibility and word finding.  During the abovementioned task, pt required mod assist verbal cues for word retrieval and recall of previously named items.  Pt was returned to room and left in wheelchair with quick release belt donned.  Continue per current plan of care.    Function:  Eating Eating   Modified Consistency Diet: Yes Eating Assist Level: Supervision or verbal cues           Cognition Comprehension Comprehension assist level: Understands complex 90% of the time/cues 10% of the time  Expression Expression assistive device: Talk trach valve Expression assist level: Expresses basic 90% of the time/requires cueing < 10% of the time.  Social Interaction Social Interaction assist level: Interacts appropriately 90% of the time - Needs monitoring or encouragement for participation or interaction.  Problem Solving Problem solving assist level: Solves basic 75 - 89% of the time/requires cueing 10 - 24% of the time  Memory Memory assist level: Recognizes or recalls 50 - 74% of the time/requires cueing 25 - 49% of the time    Pain Pain Assessment Pain Assessment: No/denies pain  Therapy/Group: Individual Therapy  Keani Gotcher, Melanee SpryNicole L 07/31/2015, 4:10 PM

## 2015-07-31 NOTE — Progress Notes (Signed)
Physical Therapy Session Note  Patient Details  Name: Gwendolyn SimonSherry Pearse MRN: [id number] Date of Birth: [birth year]  Today's Date: 07/31/2015 PT Individual Time: 1420-1543 PT Individual Time Calculation (min): 83 min   Short Term Goals: Week 1:  PT Short Term Goal 1 (Week 1): pt will move supine> sit with mod assist PT Short Term Goal 2 (Week 1): pt will transfer bed>< w/c with assist of 1 person PT Short Term Goal 3 (Week 1): pt will lock/unlock w/c brakes with 1 cue PT Short Term Goal 4 (Week 1): pt will stand x 2 minutes during functional activity  Skilled Therapeutic Interventions/Progress Updates:    Pt received in w/c & RT present during first hour of session. Pt agreeable to PT, denying c/o pain. Transported pt room>gym & provided pt with alternate 20x18 w/c to improve leg rests. Pt performed standing in parallel bars requiring min A when pulling on parallel bars to stand, but required max A when pushing up from w/c armrests to stand. Pt able to tolerate standing x 30 seconds + 30 seconds + 2 minutes 10 seconds + 1 minute 30 seconds with prolonged seated rest breaks in between. Pt c/o dizziness & leaning to R when standing; educated pt on reasoning for R lateral lean & BP = 140/77 mmHg after single standing trial. Attempted marching in place with pt experiencing R knee buckling and progressively narrow base of support. Pt transferred squat pivot w/c>nustep with +2 for safety & multiple scoots to complete transfer. Pt required cuing for repositioning of B feet on floor. Utilized nu-step on level 1 x 12 minutes with pt reported 12 on Borg RPE scale & BLE soreness following standing. Pt required cuing to minimize RLE abduction as pt is able to self correct but fatigues quickly so adductor support attached & utilized. Pt transferred nu-step>w/c with max A +1 with assistance to reposition feet to allow pt to push with BLE. W/c mobility x 100 ft gym>room with supervision A, multiple rest breaks, and  education to utilize drive wheels. At end of session pt left in w/c with QRB in place & all needs within reach.   Throughout session pt educated on pursed lip breathing but pt with fair carryover. Pt also verbally expressed, multiple times, eagerness to return home. Educated pt on need to focus on small improvements made daily.   Therapy Documentation Precautions:  Precautions Precautions: Fall Restrictions Weight Bearing Restrictions: No  Pain: Pain Assessment Pain Assessment: No/denies pain   See Function Navigator for Current Functional Status.   Therapy/Group: Individual Therapy  Sandi MariscalVictoria M Jakobie Henslee 07/31/2015, 12:26 PM

## 2015-07-31 NOTE — Progress Notes (Signed)
Subjective/Complaints:  No issues overnite, no breathing problems  Review of systems negative for shortness of breath, chest pain, nausea vomiting.   Objective: Vital Signs: Blood pressure 108/71, pulse 85, temperature 98.3 F (36.8 C), temperature source Oral, resp. rate 17, weight 105.6 kg (232 lb 12.9 oz), SpO2 97 %. No results found. Results for orders placed or performed during the hospital encounter of 07/24/15 (from the past 72 hour(s))  Creatinine, serum     Status: Abnormal   Collection Time: 07/31/15  5:54 AM  Result Value Ref Range   Creatinine, Ser 2.01 (H) 0.44 - 1.00 mg/dL   GFR calc non Af Amer 28 (L) >60 mL/min   GFR calc Af Amer 32 (L) >60 mL/min    Comment: (NOTE) The eGFR has been calculated using the CKD EPI equation. This calculation has not been validated in all clinical situations. eGFR's persistently <60 mL/min signify possible Chronic Kidney Disease.      HEENT: #6 Shiley, well-placed, no surrounding erythema or drainage Cardio: RRR and no murmurs or extra sounds Resp: CTA B/L and unlabored GI: BS positive and non-distended nontender Extremity:  Pulses positive and No Edema Skin:   Other ttrach site CDI, IV site left arm CDI Neuro: Alert/Oriented, Cranial Nerve II-XII normal, Normal Sensory, Abnormal Motor 4- /5 bilateral deltoid, biceps, triceps, grip, hip flexor, knee extensor, ankle dorsiflexor and plantar flexor and Abnormal FMC Ataxic/ dec FMC Musc/Skel:  Normal Gen. no acute distress   Assessment/Plan: 1. 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 Physiatrist is providing close team supervision and 24 hour management of active medical problems listed below. Physiatrist and rehab team continue to assess barriers to discharge/monitor patient progress toward functional and  medical goals. FIM: Function - Bathing Position: Bed Body parts bathed by patient: Left arm, Right arm, Chest, Abdomen, Right upper leg, Left upper leg Body parts bathed by helper: Right lower leg, Left lower leg, Back Bathing not applicable: Front perineal area, Buttocks (Completed earlier with nursing per pt) Assist Level:  (max assist)  Function- Upper Body Dressing/Undressing What is the patient wearing?: Pull over shirt/dress Bra - Perfomed by helper: Thread/unthread right bra strap, Thread/unthread left bra strap, Hook/unhook bra (pull down sports bra) Pull over shirt/dress - Perfomed by patient: Thread/unthread left sleeve, Put head through opening, Thread/unthread right sleeve, Pull shirt over trunk Pull over shirt/dress - Perfomed by helper: Thread/unthread right sleeve, Pull shirt over trunk Assist Level:  (mod assist) Function - Lower Body Dressing/Undressing What is the patient wearing?: Non-skid slipper socks Position: Bed Pants- Performed by helper: Thread/unthread right pants leg, Thread/unthread left pants leg, Pull pants up/down, Fasten/unfasten pants Non-skid slipper socks- Performed by helper: Don/doff right sock, Don/doff left sock Assist for footwear: Dependant Assist for lower body dressing: 2 Helpers  Function - Toileting Toileting activity did not occur: No continent bowel/bladder event Toileting steps completed by helper: Adjust clothing prior to toileting, Performs perineal hygiene, Adjust clothing after toileting Assist level: Two helpers  Function - Toilet Transfers Assist level to toilet: 2 helpers (per [employer], NT) Assist level from toilet: 2 helpers  Function - Chair/bed transfer Chair/bed transfer method: Lateral scoot Chair/bed transfer assist level: Moderate assist (Pt 50 - 74%/lift or lower) Chair/bed transfer assistive device: Sliding board Mechanical lift: Stedy Chair/bed transfer details: Manual facilitation for placement, Manual  facilitation for weight bearing, Verbal cues  for technique, Manual facilitation for weight shifting  Function - Locomotion: Wheelchair Will patient use wheelchair at discharge?: Yes Type: Manual Max wheelchair distance: 150 Assist Level: Supervision or verbal cues Assist Level: Supervision or verbal cues Assist Level: Supervision or verbal cues Turns around,maneuvers to table,bed, and toilet,negotiates 3% grade,maneuvers on rugs and over doorsills: No Function - Locomotion: Ambulation Ambulation activity did not occur: Safety/medical concerns (unsafe to attempt due to weakness)  Function - Comprehension Comprehension: Auditory Comprehension assist level: Understands complex 90% of the time/cues 10% of the time  Function - Expression Expression: Verbal Expression assistive device: Talk trach valve Expression assist level: Expresses basic 90% of the time/requires cueing < 10% of the time.  Function - Social Interaction Social Interaction assist level: Interacts appropriately 90% of the time - Needs monitoring or encouragement for participation or interaction.  Function - Problem Solving Problem solving assist level: Solves complex 90% of the time/cues < 10% of the time  Function - Memory Memory assist level: Recognizes or recalls 90% of the time/requires cueing < 10% of the time Patient normally able to recall (first 3 days only): Current season   Medical Problem List and Plan: 1.  Functional deficits, right hemiparesis and general debility secondary to hypertensive left parietal and pontine hemorrhages with subsequent complications,Team conf in am 2.  DVT Prophylaxis/Anticoagulation: Pharmaceutical: Lovenox 3. Pain Management:  Will try low dose ultram prn   4. Mood: team to provide ego support. LCSW to follow for evaluation.   5. Neuropsych: This patient is not capable of making decisions on her own behalf. 6. Skin/Wound Care:  Continue rectal tube for now. Routine pressure  relief measures. Maintain adequate nutrition and hydration.   7. Fluids/Electrolytes/Nutrition: Monitor I/O on current diet.  Check lytes in am.  20-75% meal intake 8. VDRF with trach: Continue CFS #6 with ATC.Plan wean to #4 , will ask trach team to eval 9. HTN: Continue norvasc, lisinopril, apresoline, metoprolol and HCTZ.  Monitor BP every 6 hours and titrate medications as indicated.  Filed Vitals:   07/31/15 0428 07/31/15 0539  BP:  108/71  Pulse: 86 85  Temp:  98.3 F (36.8 C)  Resp: _0 Hypokalemia: Likely due to diuretics, diarrhea and IVF. Discontinue IVF and increase oral supplement. CHeck full CMET as creat increased, resume IVF BMP Latest Ref Rng 07/31/2015 07/25/2015 07/24/2015  Glucose 65 - 99 mg/dL - 94 96  BUN 6 - 20 mg/dL - 15 19  Creatinine 0.44 - 1.00 mg/dL 2.01(H) 0.76 0.73  Sodium 135 - 145 mmol/L - 136 135  Potassium 3.5 - 5.1 mmol/L - 3.8 3.8  Chloride 101 - 111 mmol/L - 103 105  CO2 22 - 32 mmol/L - 26 23  Calcium 8.9 - 10.3 mg/dL - 9.2 8.8(L)    11. ABLA: Monitor for signs of bleeding. Check stool guaiacs.   12. Dysphagia: Monitor tolerance of dysphagia 2, nectar liquids. Needs full supervision at meals. Discontinue IVF.   13. Abnormal LFT:  Discontinue tylenol.? Shock liver, Recheck CMET 14. Anxiety: Monitor on buspar bid.  15. Diarrhea: improved , last BM 6/17 16. Incontinence: UTI K. Pneumoniae, S to Bactrim   17.  Hypoalbuminemia start Prostat LOS (Days) 7 A FACE TO FACE EVALUATION WAS PERFORMED  KIRSTEINS,ANDREW E 07/31/2015, 7:09 AM

## 2015-08-01 ENCOUNTER — Inpatient Hospital Stay (HOSPITAL_COMMUNITY): Payer: Self-pay | Admitting: Speech Pathology

## 2015-08-01 ENCOUNTER — Inpatient Hospital Stay (HOSPITAL_COMMUNITY): Payer: Self-pay | Admitting: Occupational Therapy

## 2015-08-01 ENCOUNTER — Inpatient Hospital Stay (HOSPITAL_COMMUNITY): Payer: Self-pay

## 2015-08-01 DIAGNOSIS — N179 Acute kidney failure, unspecified: Secondary | ICD-10-CM

## 2015-08-01 LAB — BASIC METABOLIC PANEL
Anion gap: 8 (ref 5–15)
BUN: 45 mg/dL — AB (ref 6–20)
CHLORIDE: 102 mmol/L (ref 101–111)
CO2: 23 mmol/L (ref 22–32)
Calcium: 10.1 mg/dL (ref 8.9–10.3)
Creatinine, Ser: 2.29 mg/dL — ABNORMAL HIGH (ref 0.44–1.00)
GFR, EST AFRICAN AMERICAN: 28 mL/min — AB (ref >60)
GFR, EST NON AFRICAN AMERICAN: 24 mL/min — AB (ref >60)
Glucose, Bld: 89 mg/dL (ref 65–99)
POTASSIUM: 5.3 mmol/L — AB (ref 3.5–5.1)
SODIUM: 133 mmol/L — AB (ref 135–145)

## 2015-08-01 NOTE — Progress Notes (Signed)
Physical Therapy Weekly Progress Note  Patient Details  Name: Gwendolyn Morales MRN: [id number] Date of Birth: 1965-07-26  Beginning of progress report period: 6/13/`17 End of progress report period:08/01/15  Today's Date: 08/01/2015 PT Individual Time: 1300-1345 PT Individual Time Calculation (min): 45 min   Patient has met 4 of 4 short term goals.    Patient continues to demonstrate the following deficits: motor, balance, activity tolerance, mobility, locomotion, cognition and therefore will continue to benefit from skilled PT intervention to enhance overall performance with activity tolerance, balance, postural control, ability to compensate for deficits, functional use of  right upper extremity, right lower extremity, left upper extremity and left lower extremity and awareness.  Patient progressing toward long term goals..  Continue plan of care.  PT Short Term Goals Week 1:  PT Short Term Goal 1 (Week 1): pt will move supine> sit with mod assist PT Short Term Goal 1 - Progress (Week 1): Met PT Short Term Goal 2 (Week 1): pt will transfer bed>< w/c with assist of 1 person PT Short Term Goal 2 - Progress (Week 1): Met PT Short Term Goal 3 (Week 1): pt will lock/unlock w/c brakes with 1 cue PT Short Term Goal 3 - Progress (Week 1): Met PT Short Term Goal 4 (Week 1): pt will stand x 2 minutes during functional activity PT Short Term Goal 4 - Progress (Week 1): Met  Skilled Therapeutic Interventions/Progress Updates:  Gait in parallel bars x 2 including turns.  Gait with weighted grocery cart on level tile x 20' , x 30' with w/c follow.  L knee buckled at end of 1st trial, with quick sitting required.  neuromuscular re-education via forced use, demo, VCs for alternating LE extension in sitting in w/c, using Kinetron at resistance 50 cm/sec on and off x 7 min.  Pt left resting in w/c with quick release belt applied and all needs within reach.     Therapy Documentation Precautions:   Precautions Precautions: Fall Restrictions Weight Bearing Restrictions: No   Vital Signs: Therapy Vitals Pulse Rate: 87 after gait Patient Position (if appropriate): Sitting Oxygen Therapy SpO2: 97 % after gait        See Function Navigator for Current Functional Status.  Therapy/Group: Individual Therapy  Gwendolyn Morales 08/01/2015, 1:49 PM

## 2015-08-01 NOTE — Progress Notes (Signed)
Subjective/Complaints:  Discussed renal fxn with pt.  TRach downsized yest , feels better, smaller  Review of systems negative for shortness of breath, chest pain, nausea vomiting.   Objective: Vital Signs: Blood pressure 124/69, pulse 82, temperature 98.1 F (36.7 C), temperature source Oral, resp. rate 16, weight 106.6 kg (235 lb 0.2 oz), SpO2 94 %. No results found. Results for orders placed or performed during the hospital encounter of 07/24/15 (from the past 72 hour(s))  Creatinine, serum     Status: Abnormal   Collection Time: 07/31/15  5:54 AM  Result Value Ref Range   Creatinine, Ser 2.01 (H) 0.44 - 1.00 mg/dL   GFR calc non Af Amer 28 (L) >60 mL/min   GFR calc Af Amer 32 (L) >60 mL/min    Comment: (NOTE) The eGFR has been calculated using the CKD EPI equation. This calculation has not been validated in all clinical situations. eGFR's persistently <60 mL/min signify possible Chronic Kidney Disease.   Basic metabolic panel     Status: Abnormal   Collection Time: 08/01/15  4:30 AM  Result Value Ref Range   Sodium 133 (L) 135 - 145 mmol/L   Potassium 5.3 (H) 3.5 - 5.1 mmol/L   Chloride 102 101 - 111 mmol/L   CO2 23 22 - 32 mmol/L   Glucose, Bld 89 65 - 99 mg/dL   BUN 45 (H) 6 - 20 mg/dL   Creatinine, Ser 2.29 (H) 0.44 - 1.00 mg/dL   Calcium 10.1 8.9 - 10.3 mg/dL   GFR calc non Af Amer 24 (L) >60 mL/min   GFR calc Af Amer 28 (L) >60 mL/min    Comment: (NOTE) The eGFR has been calculated using the CKD EPI equation. This calculation has not been validated in all clinical situations. eGFR's persistently <60 mL/min signify possible Chronic Kidney Disease.    Anion gap 8 5 - 15     HEENT: #6 Shiley, well-placed, no surrounding erythema or drainage Cardio: RRR and no murmurs or extra sounds Resp: CTA B/L and unlabored GI: BS positive and non-distended nontender Extremity:  Pulses positive and No Edema Skin:   Other ttrach site CDI, IV site left arm CDI Neuro:  Alert/Oriented, Cranial Nerve II-XII normal, Normal Sensory, Abnormal Motor 4- /5 bilateral deltoid, biceps, triceps, grip, hip flexor, knee extensor, ankle dorsiflexor and plantar flexor and Abnormal FMC Ataxic/ dec FMC Musc/Skel:  Normal Gen. no acute distress   Assessment/Plan: 1. 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 Physiatrist is providing close team supervision and 24 hour management of active medical problems listed below. Physiatrist and rehab team continue to assess barriers to discharge/monitor patient progress toward functional and medical goals. FIM: Function - Bathing Position: Wheelchair/chair at sink Body parts bathed by patient: Left arm, Right arm, Chest, Abdomen, Right upper leg, Left upper leg Body parts bathed by helper: Right lower leg, Left lower leg, Back, Buttocks, Front perineal area Bathing not applicable: Front perineal area, Buttocks (Completed earlier with nursing per pt) Assist Level: 2 helpers  Function- Upper Body Dressing/Undressing What is the patient wearing?: Pull over shirt/dress Bra - Perfomed by helper: Thread/unthread right bra strap, Thread/unthread left bra strap, Hook/unhook bra (pull down sports bra) Pull over shirt/dress - Perfomed by patient: Thread/unthread left sleeve, Put head through opening, Thread/unthread right sleeve, Pull shirt over trunk Pull over shirt/dress - Perfomed by  helper: Thread/unthread right sleeve, Pull shirt over trunk Assist Level:  (mod assist) Function - Lower Body Dressing/Undressing What is the patient wearing?: Non-skid slipper socks, Pants, Socks Position: Bed Pants- Performed by helper: Thread/unthread right pants leg, Thread/unthread left pants leg, Pull pants up/down Non-skid slipper socks- Performed by patient: Don/doff right  sock Non-skid slipper socks- Performed by helper: Don/doff left sock Assist for footwear: Dependant Assist for lower body dressing: 2 Helpers  Function - Toileting Toileting activity did not occur: No continent bowel/bladder event Toileting steps completed by helper: Adjust clothing prior to toileting, Performs perineal hygiene, Adjust clothing after toileting Assist level: Two helpers  Function - Toilet Transfers Assist level to toilet: 2 helpers Assist level from toilet: 2 helpers  Function - Chair/bed transfer Chair/bed transfer method: Stand pivot Chair/bed transfer assist level: Total assist (Pt < 25%) Chair/bed transfer assistive device: Armrests Mechanical lift: Stedy Chair/bed transfer details: Manual facilitation for placement, Manual facilitation for weight bearing, Verbal cues for technique, Manual facilitation for weight shifting  Function - Locomotion: Wheelchair Will patient use wheelchair at discharge?: Yes Type: Manual Max wheelchair distance: 100 ft Assist Level: Supervision or verbal cues Assist Level: Supervision or verbal cues Assist Level: Supervision or verbal cues Turns around,maneuvers to table,bed, and toilet,negotiates 3% grade,maneuvers on rugs and over doorsills: No Function - Locomotion: Ambulation Ambulation activity did not occur: Safety/medical concerns (unsafe to attempt due to weakness)  Function - Comprehension Comprehension: Auditory Comprehension assist level: Understands basic 90% of the time/cues < 10% of the time  Function - Expression Expression: Verbal Expression assistive device: Talk trach valve Expression assist level: Expresses basic 90% of the time/requires cueing < 10% of the time.  Function - Social Interaction Social Interaction assist level: Interacts appropriately 90% of the time - Needs monitoring or encouragement for participation or interaction.  Function - Problem Solving Problem solving assist level: Solves basic 75  - 89% of the time/requires cueing 10 - 24% of the time  Function - Memory Memory assist level: Recognizes or recalls 50 - 74% of the time/requires cueing 25 - 49% of the time Patient normally able to recall (first 3 days only): Current season   Medical Problem List and Plan: 1.  Functional deficits, right hemiparesis and general debility secondary to hypertensive left parietal and pontine hemorrhages with subsequent complications,Team conference today please see physician documentation under team conference tab, met with team face-to-face to discuss problems,progress, and goals. Formulized individual treatment plan based on medical history, underlying problem and comorbidities. 2.  DVT Prophylaxis/Anticoagulation: Pharmaceutical: Lovenox 3. Pain Management:  Will try low dose ultram prn   4. Mood: team to provide ego support. LCSW to follow for evaluation.   5. Neuropsych: This patient is not capable of making decisions on her own behalf. 6. Skin/Wound Care:  Continue rectal tube for now. Routine pressure relief measures. Maintain adequate nutrition and hydration.   7. Fluids/Electrolytes/Nutrition: Monitor I/O on current diet.  Check lytes in am.  20-75% meal intake 8. VDRF with trach: Continue CFS #6 with ATC.Plan wean to #4 , will ask trach team to eval 9. HTN: Continue norvasc, lisinopril, apresoline, metoprolol and HCTZ.  Monitor BP every 6 hours and titrate medications as indicated.  Filed Vitals:   08/01/15 0500 08/01/15 0742  BP: 124/69   Pulse: 94 82  Temp: 98.1 F (36.7 C)   Resp: _0 Hypokalemia: Likely due to diuretics, diarrhea and IVF. Discontinue IVF and increase oral supplement. ARF, suspect Bactrim +/-  ACE I, will D/C  And hydrate BMP Latest Ref Rng 08/01/2015 07/31/2015 07/25/2015  Glucose 65 - 99 mg/dL 89 - 94  BUN 6 - 20 mg/dL 45(H) - 15  Creatinine 0.44 - 1.00 mg/dL 2.29(H) 2.01(H) 0.76  Sodium 135 - 145 mmol/L 133(L) - 136  Potassium 3.5 - 5.1 mmol/L 5.3(H) -  3.8  Chloride 101 - 111 mmol/L 102 - 103  CO2 22 - 32 mmol/L 23 - 26  Calcium 8.9 - 10.3 mg/dL 10.1 - 9.2    11. ABLA: Monitor for signs of bleeding. Check stool guaiacs.   12. Dysphagia: Monitor tolerance of dysphagia 2, nectar liquids. Needs full supervision at meals. Discontinue IVF.   13. Abnormal LFT:  Discontinue tylenol.? Shock liver, Recheck CMET 14. Anxiety: Monitor on buspar bid.  15. Diarrhea: improved , last BM 6/17 16. Incontinence: UTI K. Pneumoniae, S to Bactrim , completed course  17.  Hypoalbuminemia start Prostat LOS (Days) 8 A FACE TO FACE EVALUATION WAS PERFORMED  KIRSTEINS,ANDREW E 08/01/2015, 8:18 AM

## 2015-08-01 NOTE — Progress Notes (Signed)
Trach capped at this time, pt able to vocalize with distress, tolerating well.

## 2015-08-01 NOTE — Patient Care Conference (Signed)
Inpatient RehabilitationTeam Conference and Plan of Care Update Date: 08/01/2015   Time: 10:30 AM    Patient Name: Gwendolyn SimonSherry Menken      Medical Record Number: [id number]  Date of Birth: Jul 03, 1965 Sex: Female         Room/Bed: 4M07C/4M07C-01 Payor Info: Payor: /    Admitting Diagnosis: Sethi - ICH  Admit Date/Time:  07/24/2015  5:01 PM Admission Comments: No comment available   Primary Diagnosis:  Cerebral parenchymal hemorrhage (HCC) Principal Problem: Cerebral parenchymal hemorrhage [facility](HCC)  Patient Active Problem List   Diagnosis Date Noted  . Cerebral parenchymal hemorrhage (HCC) 07/24/2015  . 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 Elevated bilirubin   . Adjustment disorder with mixed anxiety and depressed mood   . Diastolic dysfunction   . Morbid obesity due to excess calories (HCC)   . H/O noncompliance with medical treatment, presenting hazards to health   . Tachypnea   . Acute blood loss anemia   . Dysphagia, post-stroke   . HTN (hypertension)   . Productive cough   . Tracheostomy status (HCC)   . 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 Hypokalemia   . Azotemia   . Acute respiratory failure (HCC)   . Acute respiratory failure with hypoxemia (HCC)   . 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 ICH (intracerebral hemorrhage) (HCC) 07/12/2015    Expected Discharge Date: Expected Discharge Date: 08/14/15  Team Members Present: Physician leading conference: Dr. Claudette LawsAndrew Kirsteins Social Worker Present: Dossie DerBecky Lilyahna Sirmon, LCSW Nurse Present: Chana Bodeeborah Sharp, RN PT Present: Wanda Plumparoline Cook, PT OT Present: Perrin MalteseJames McGuire, OT SLP Present: Jackalyn LombardNicole Page, SLP PPS Coordinator present : Tora DuckMarie Noel, RN, CRRN     Current Status/Progress Goal Weekly Team Focus  Medical   poor breath support, impulsive, trach downsized, multi infarct dementia  Decannulate  start trach capping    Bowel/Bladder   Patient continent of bowel and bladder LBM 07/28/15  cont of bowel and bladder  cont to monitor q shift   Swallow/Nutrition/ Hydration   Dys 2, nectar thick liquids   mod I with least restrictive diet   work towards advancement to thin liquids with use of chin tuck    ADL's   supervision for UB bathing and dressing, +2 (pt 50%) for LB selfcare sit to stand with max assist for stand pivot transfers.  Still with BUE weakness at 3/5 in the shoulders but uses functionally with self care tasks at a supervision level.   min assist level for transfers and LB selfcare, supervision for UB selfcare tasks  selfcare re-training, transfer training, BUE strengthening, neuromuscular re-education, pt/family education, cognitive re-training   Mobility   as of 08/01/15: mod assist slide board transfer; standing in parallel bars x 1-2 minutes; w/c x 100' supervision with rest breaks  min assist transfers, gait x 50' up/down 4 steps, and supervision w/c x 150'  activity tolerance, neuro re-ed, balance, mobility, pre-gait, pt ed   Communication   Mild-moderate word finding impairments; moderate dysarthria   supervision   intelligibility in sentences, moderately complex word finding    Safety/Cognition/ Behavioral Observations  poor carryover of new information, decreased sustained attention to tasks, impaired emergent awareness of deficits   supervision   recall of daily information, attention to tasks, emergent awareness    Pain   no complaints of pain  min assist  cont. to monitor q shift   Skin   CDI  no  skin breakdown this admission  cont. to monitor q shift    Rehab Goals Patient on target to meet rehab goals: Yes *See Care Plan and progress notes for long and short-term goals.  Barriers to Discharge: will need heavy assist    Possible Resolutions to Barriers:  See above, working on po feeds    Discharge Planning/Teaching Needs:  Home with husband and son who takes college classes  on-line. Concern is son will not be comfortable tolieting Mom. Husband works daytime.      Team Discussion:  Goals-min assist level, currently mod-max assist level. Downsized trach to 4, hopefully will plug and decannulate next week. Poor carryover and sustained attention. Treated UTI now watching renal status-medically. Dys 2 nectar thick diet hope to upgrade. Timed toileting with RN. Work on family education and making sure son can do the care pt requires.  Revisions to Treatment Plan:  None   Continued Need for Acute Rehabilitation Level of Care: The patient requires daily medical management by a physician with specialized training in physical medicine and rehabilitation for the following conditions: Daily direction of a multidisciplinary physical rehabilitation program to ensure safe treatment while eliciting the highest outcome that is of practical value to the patient.: Yes Daily medical management of patient stability for increased activity during participation in an intensive rehabilitation regime.: Yes Daily analysis of laboratory values and/or radiology reports with any subsequent need for medication adjustment of medical intervention for : Neurological problems  Mohammad Granade, Lemar Livings 08/01/2015, 1:52 PM

## 2015-08-01 NOTE — Progress Notes (Signed)
Occupational Therapy Weekly Progress Note  Patient Details  Name: Gwendolyn Morales MRN: [id number] Date of Birth: October 02, 1965  Beginning of progress report period: July 25, 2015 End of progress report period: August 01, 2015  Today's Date: 08/01/2015 OT Individual Time: 1446-1530 OT Individual Time Calculation (min): 44 min    Patient has met 4 of 5 short term goals.  Gwendolyn Morales has made tremendous progress with OT this week.  She is able to complete sit to stand for LB selfcare tasks at a max assist level.  She is able to maintain static standing with BUE support for 3 min intervals as well with mod assist to maintain weightshift to the left and bilateral knee extension.  Squat pivot transfers are completed at a mod assist level as well to the drop arm commode or the wheelchair.  Still with BUE weakness with the right being more affected than the left, however she uses both functionally with most selfcare tasks with supervision.  Therapist has issued red foam grips to go over utensils for self feeding with the LUE which she is able to use with modified independence.  Gwendolyn Morales continues to need min to mod instructional cueing to sequencing through bathing, dressing, and toilet transfers at this time and continues to perseverate on the smaller pieces of a task that she can not quite complete instead of seeing the big picture in that she can stand with max assist or better at this time compared to total +2 last week.  Recommend continued OT at CIR level to progress toward established min assist level goals.  Patient continues to demonstrate the following deficits:  Decreased balance, decreased endurance, decreased BLE and BUE strength, decreased BUE coordination, and decreased awareness, and therefore will continue to benefit from skilled OT intervention to enhance overall performance with BADL.  Patient progressing toward long term goals..  Continue plan of care.  OT Short Term Goals Week 2:   OT Short Term Goal 1 (Week 2): Pt will transfer from supine to sit EOB in preparation for selfcare tasks with supervision.  OT Short Term Goal 2 (Week 2): Pt will complete LB bathing sit to stand with mod assist 2 consecutive sessions. OT Short Term Goal 3 (Week 2): Pt will transfer stand pivot to the 3:1 with mod assist.   OT Short Term Goal 4 (Week 2): Pt will complete tub/shower transfers with mod assist stand pivot with RW.  Skilled Therapeutic Interventions/Progress Updates:    Pt worked on sit to stand transitions and standing balance during session.  Total assist +2 (pt 50%) for sit to stand with mod instructional cueing for technique including scooting to the EOM and completed forward trunk flexion while maintaining neutral pelvic tilt.  She was able to maintain standing for 3 minute intervals with BUE support on bedside table.  Mod facilitation for hip and knee extension with the right knee needing more cueing and support at times than the left.  Noted increased lean to the right as well as requiring mod assist to shift her hips over to the left secondary to falling to the right.  Progressed to reaching unilaterally with the LUE and RUE for placing stacking cups to target as well in standing.  Completed session with functional mobility with +2 pt (50%) for ambulation from mat out in the hallway.  Demonstrating reciprical gait pattern as well.  Oxygen sats maintain 98% during entire session.  Pt returned to room via wheelchair and placed beside of bed with safety  belt in place.    Therapy Documentation Precautions:  Precautions Precautions: Fall Precaution Comments: trach plugged for therapy Restrictions Weight Bearing Restrictions: No  Vital Signs: Therapy Vitals Temp: 98.3 F (36.8 C) Temp Source: Oral Pulse Rate: 74 Resp: 17 (Simultaneous filing. User may not have seen previous data.) BP: 110/68 mmHg Patient Position (if appropriate): Sitting Oxygen Therapy SpO2: 98 % O2 Device:  Not Delivered Pain: Pain Assessment Pain Assessment: No/denies pain ADL: See Function Navigator for Current Functional Status.   Therapy/Group: Individual Therapy  Arsenio Schnorr OTR/L 08/01/2015, 3:59 PM

## 2015-08-01 NOTE — Progress Notes (Signed)
Occupational Therapy Session Note  Patient Details  Name: Gwendolyn SimonSherry Crays MRN: [id number] Date of Birth: [birth year]  Today's Date: 08/01/2015 OT Individual Time: 1003-1100 OT Individual Time Calculation (min): 57 min   Short Term Goals: Week 1:  OT Short Term Goal 1 (Week 1): Pt will maintain static/dynamic sittting EOC during UB dressing with supervision. OT Short Term Goal 2 (Week 1): Pt will complete LB bathing sit to stand with max assist of 1. OT Short Term Goal 3 (Week 1): Pt will complete UB dressing in unsupported sitting with supervision. OT Short Term Goal 4 (Week 1): Pt will transfer from supine to sit EOB in preparation for selfcare tasks with supervision.  OT Short Term Goal 5 (Week 1): Pt will perform squat pivot transfer to drop arm commode with max assist.   Skilled Therapeutic Interventions/Progress Updates:  Patient found supine in bed with no complaints of pain, but some stiffness in left arm and pain in IV site; notified RN of this. Pt engaged in bed mobility with HOB elevated and close supervision. Pt then transferred EOB to w/c using slide board with mod assist from therapist. Pt propelled self to sink in w/c and performed UB bathing and dressing in seated position, also completing LB bathing of legs and feet by crossing over feet. Pt stated she recently washed her peri area and had clean pants on. After ADL, educated pt on fine motor exercises for R hand (thumb composition). Assisted pt to therapy gym, and administered blue graded foam for gross and fine motor strengthening exercises. At end of session left pt seated in w/c with all needs within reach.   Therapy Documentation Precautions:  Precautions Precautions: Fall Restrictions Weight Bearing Restrictions: No  Vital Signs: Therapy Vitals Temp: 98.1 F (36.7 C) Temp Source: Oral Pulse Rate: 82 Resp: 16 BP: 124/69 mmHg Patient Position (if appropriate): Lying Oxygen Therapy SpO2: 94 % O2 Device: Not  Delivered  See Function Navigator for Current Functional Status.  Therapy/Group: Individual Therapy  Edwin CapPatricia Tavish Gettis , MS, OTR/L, CLT  08/01/2015, 11:04 AM

## 2015-08-01 NOTE — Progress Notes (Signed)
Speech Language Pathology Weekly Progress and Session Note  Patient Details  Name: Gwendolyn Morales MRN: [id number] Date of Birth: 12/06/1965  Beginning of progress report period: July 25, 2015   End of progress report period: August 01, 2015   Today's Date: 08/01/2015 SLP Individual Time: 2122-4825 SLP Individual Time Calculation (min): 41 min  Short Term Goals: Week 1: SLP Short Term Goal 1 (Week 1): Pt will consume therapeutic trials of thin liquids with supervision cues for use of swallowing precautions and minimal overt s/s of aspiration  SLP Short Term Goal 1 - Progress (Week 1): Progressing toward goal SLP Short Term Goal 2 (Week 1): Pt will consume dys 2 textures and nectar thick liquids with supervision cues for use of swallowing precautions and minimal overt s/s of aspiration  SLP Short Term Goal 2 - Progress (Week 1): Met SLP Short Term Goal 3 (Week 1): Pt will be intelligible at the sentence level in 75% of opportunities with min assist verbal cues for pacing, overarticulation, and increased vocal intensity.  SLP Short Term Goal 3 - Progress (Week 1): Met SLP Short Term Goal 4 (Week 1): Pt will sustain her attention to basic, tasks for 20 minutes with supervision verbal cues for redirection to task  SLP Short Term Goal 4 - Progress (Week 1): Met SLP Short Term Goal 5 (Week 1): Pt will recall basic, daily information with supervision assist verbal cues for use of external aids.   SLP Short Term Goal 5 - Progress (Week 1): Progressing toward goal SLP Short Term Goal 6 (Week 1): Pt will monitor and correct verbal errors in 75% of opportunities with supervision verbal cues.  SLP Short Term Goal 6 - Progress (Week 1): Progressing toward goal    New Short Term Goals: Week 2: SLP Short Term Goal 1 (Week 2): Pt will consume therapeutic trials of thin liquids with supervision cues for use of swallowing precautions and minimal overt s/s of aspiration  SLP Short Term Goal 2 (Week 2): Pt  will be intelligible at the sentence level in 75% of opportunities with supervision cues for pacing, overarticulation, and increased vocal intensity.  SLP Short Term Goal 3 (Week 2): Pt will sustain her attention to basic, tasks for 20 minutes with mod I redirection to task  SLP Short Term Goal 4 (Week 2): Pt will recall basic, daily information with supervision assist verbal cues for use of external aids.   SLP Short Term Goal 5 (Week 2): Pt will monitor and correct verbal errors in 75% of opportunities with supervision verbal cues.   Weekly Progress Updates:  Pt made functional gains this reporting period and has met 3 out of 6 short term goals.  Pt has made improvements in speech intelligibility and use of swallowing precautions with currently prescribed diet.  Pt requires min assist for use of chin tuck with thin liquids.  Pt also requires min assist for basic cognitive tasks due to poor emergent awareness of deficits, impaired sustained attention to tasks, and poor recall of daily information.  Pt also benefits from min assist verbal cues for word finding as well as monitoring and correction of verbal errors.  Pt would continue to benefit from skilled ST while inpatient in order to maximize functional independence and reduce burden of care prior to discharge.  Recommend 24/7 supervision at discharge in addition to [place] follow up at next level of care.  Pt and family education is ongoing.     Intensity: Minumum of 1-2 x/day, 30  to 90 minutes Frequency: 3 to 5 out of 7 days Duration/Length of Stay: 21-28 days  Treatment/Interventions: Cognitive remediation/compensation;Cueing hierarchy;Dysphagia/aspiration precaution training;Internal/external aids;Patient/family education;Functional tasks   Daily Session  Skilled Therapeutic Interventions: Pt was seen for skilled ST targeting goals for cognition and dysphagia.  Upon arrival, pt slouched down in bottom of bed with right leg hanging off the side on  the floor.  Pt reported that she was trying to get up out of bed unassisted; therefore, SLP provided skilled education regarding safety precautions and rationale behind calling for help to get out of bed.  Pt positioned herself safely at edge of bed to consume breakfast with supervision verbal cues.  Pt required min assist verbal cues to utilize chin tuck during trials of thin liquids during breakfast.  Improved s/s of aspiration noted today in comparison to yesterday with immediate cough on initial x2 sips of thin liquids; no other s/s with consecutive sips.  Recommend continued trials of thin liquids during meals with SLP prior to advancement to solidify consistent use of chin tuck.  SLP facilitated the session with a basic card game targeting sustained attention to task.  Pt required min assist verbal cues to complete task due to decreased working memory of task protocols and procedures.  Pt sustained her attention to task in a quiet environment for 15 minutes with extra time.  Pt left in bed with bed alarm set and call bell within reach.  Goals updated on this date to reflect current progress and plan of care.        Function:   Eating Eating   Modified Consistency Diet: Yes Eating Assist Level: Supervision or verbal cues           Cognition Comprehension Comprehension assist level: Understands basic 90% of the time/cues < 10% of the time  Expression Expression assistive device: Talk trach valve Expression assist level: Expresses basic 75 - 89% of the time/requires cueing 10 - 24% of the time. Needs helper to occlude trach/needs to repeat words.  Social Interaction Social Interaction assist level: Interacts appropriately 90% of the time - Needs monitoring or encouragement for participation or interaction.  Problem Solving Problem solving assist level: Solves basic 75 - 89% of the time/requires cueing 10 - 24% of the time  Memory Memory assist level: Recognizes or recalls 50 - 74% of the  time/requires cueing 25 - 49% of the time   General    Pain Pain Assessment Pain Assessment: No/denies pain  Therapy/Group: Individual Therapy  Rawn Quiroa, Selinda Orion 08/01/2015, 9:43 AM

## 2015-08-02 ENCOUNTER — Inpatient Hospital Stay (HOSPITAL_COMMUNITY): Payer: Self-pay

## 2015-08-02 ENCOUNTER — Inpatient Hospital Stay (HOSPITAL_COMMUNITY): Payer: Self-pay | Admitting: Occupational Therapy

## 2015-08-02 ENCOUNTER — Encounter (HOSPITAL_COMMUNITY): Payer: Self-pay

## 2015-08-02 ENCOUNTER — Inpatient Hospital Stay (HOSPITAL_COMMUNITY): Payer: Self-pay | Admitting: Speech Pathology

## 2015-08-02 DIAGNOSIS — F4321 Adjustment disorder with depressed mood: Secondary | ICD-10-CM

## 2015-08-02 LAB — BASIC METABOLIC PANEL
Anion gap: 11 (ref 5–15)
BUN: 44 mg/dL — AB (ref 6–20)
CHLORIDE: 101 mmol/L (ref 101–111)
CO2: 21 mmol/L — AB (ref 22–32)
CREATININE: 2.09 mg/dL — AB (ref 0.44–1.00)
Calcium: 10.3 mg/dL (ref 8.9–10.3)
GFR calc non Af Amer: 27 mL/min — ABNORMAL LOW (ref >60)
GFR, EST AFRICAN AMERICAN: 31 mL/min — AB (ref >60)
Glucose, Bld: 88 mg/dL (ref 65–99)
Potassium: 4.7 mmol/L (ref 3.5–5.1)
Sodium: 133 mmol/L — ABNORMAL LOW (ref 135–145)

## 2015-08-02 MED ORDER — ENOXAPARIN SODIUM 30 MG/0.3ML ~~LOC~~ SOLN
30.0000 mg | SUBCUTANEOUS | Status: DC
  Administered 2015-08-02 – 2015-08-15 (×14): 30 mg via SUBCUTANEOUS
  Filled 2015-08-02 (×15): qty 0.3

## 2015-08-02 MED ORDER — SENNOSIDES-DOCUSATE SODIUM 8.6-50 MG PO TABS
2.0000 | ORAL_TABLET | Freq: Two times a day (BID) | ORAL | Status: DC
  Administered 2015-08-03 – 2015-08-17 (×9): 2 via ORAL
  Filled 2015-08-02 (×29): qty 2

## 2015-08-02 NOTE — Progress Notes (Signed)
Occupational Therapy Session Note  Patient Details  Name: Gwendolyn Morales MRN: [id number] Date of Birth: [birth year]  Today's Date: 08/02/2015 OT Individual Time: 0900-1000 OT Individual Time Calculation (min): 60 min   Short Term Goals: Week 1:  OT Short Term Goal 1 (Week 1): Pt will maintain static/dynamic sittting EOC during UB dressing with supervision. OT Short Term Goal 1 - Progress (Week 1): Met OT Short Term Goal 2 (Week 1): Pt will complete LB bathing sit to stand with max assist of 1. OT Short Term Goal 2 - Progress (Week 1): Met OT Short Term Goal 3 (Week 1): Pt will complete UB dressing in unsupported sitting with supervision. OT Short Term Goal 3 - Progress (Week 1): Met OT Short Term Goal 4 (Week 1): Pt will transfer from supine to sit EOB in preparation for selfcare tasks with supervision.  OT Short Term Goal 4 - Progress (Week 1): Not met OT Short Term Goal 5 (Week 1): Pt will perform squat pivot transfer to drop arm commode with max assist.  OT Short Term Goal 5 - Progress (Week 1): Met   Week 2:  OT Short Term Goal 1 (Week 2): Pt will transfer from supine to sit EOB in preparation for selfcare tasks with supervision.  OT Short Term Goal 2 (Week 2): Pt will complete LB bathing sit to stand with mod assist 2 consecutive sessions. OT Short Term Goal 3 (Week 2): Pt will transfer stand pivot to the 3:1 with mod assist.   OT Short Term Goal 4 (Week 2): Pt will complete tub/shower transfers with mod assist stand pivot with RW.  Skilled Therapeutic Interventions/Progress Updates:  Patient found seated in w/c. Pt stated she felt like she wanted to scream because there are so many new things going on right now, pt mentioned new potassium that makes her sick. Pt willing to work with therapist. Pt with no complaints of pain, but some soreness in left hand and where trach was removed this morning. Pt propelled self to dresser and gathered clothing. Pt then performed UB/LB bathing &  dressing at sink, mainly using STEDY and sitting for washing of feet. Pt sat in STEDY to brush teeth, leaning forward with no LOB to spit as needed. Pt then propelled self from room to therapy gym. Pt performed slide board transfer w/c to edge of mat, then performed scoot pivot transfer edge of mat to w/c. During transfers focused on step-by-step education for patient's safety and to increase effectiveness of transfers. Pt then performed w/c pushups, 2 sets of 10, multiple rest breaks needed. Pt continues to make great progress. Therapist assisted pt back to room at end of session and left pt seated in w/c with all needs within reach and quick release belt donned.   Therapy Documentation Precautions:  Precautions Precautions: Fall Precaution Comments: trach plugged for therapy Restrictions Weight Bearing Restrictions: No  Vital Signs: Therapy Vitals Temp: 98.4 F (36.9 C) Temp Source: Oral Pulse Rate: 74 Resp: 18 BP: 124/67 mmHg Patient Position (if appropriate): Lying Oxygen Therapy SpO2: 97 % O2 Device: Not Delivered  See Function Navigator for Current Functional Status.  Therapy/Group: Individual Therapy  [name] , MS, OTR/L, CLT  08/02/2015, 10:03 AM

## 2015-08-02 NOTE — Progress Notes (Signed)
Stoma care done by RT. Small amount of tanish drainage noted at stoma site. Stoma cleaned and redressed. Pt tolerated cleaning well, no obvious respiratory distress noted. RT will continue to monitor.

## 2015-08-02 NOTE — Plan of Care (Signed)
Problem: RH BOWEL ELIMINATION Goal: RH STG MANAGE BOWEL W/MEDICATION W/ASSISTANCE STG Manage Bowel with Medication with min Assistance.  Outcome: Not Progressing No bm x 4 days

## 2015-08-02 NOTE — Progress Notes (Signed)
Called by RN to assess trach for placement. Upon assessment, it was noted that trach was sticking out further than normal. Co2 detector placed with no color change. Order already placed for trach team consultation for possible decannulation. Pt has been capped over 24hours with no periods of desaturation or distress. Pt decannulated without difficulty and dressing applied. Will come back when pt is not busy with therapy and clean site and redress. No obvious respiratory distress noted after decannulation, vital signs were WNL. Trach placed in biohazard bag and hung over bed with obturator and inner cannula if needed. RT will continue to monitor.

## 2015-08-02 NOTE — Consult Note (Signed)
PSYCHODIAGNOSTIC EVALUATION - [facility]   Gwendolyn Morales is a 50 year old woman, who was seen for an initial psychodiagnostic examination to assess for potential depression, anxiety, or other mental illness in the setting of stroke.    During the current session, Gwendolyn Morales reported feeling "extremely stressed" about finances and ultimately acknowledged some low mood associated with her medical condition and physical limitations.  She cited the difficulty of physical therapy, slow speed of recovery, and trouble seeing her own progress as triggers for low mood.  It is notable, however, that while she admitted to feeling low at times, she was disconnected from those emotions.  It was apparent that she was actively avoiding crying or displaying other signs of sadness or discouragement, even if she feels them.  She reported feeling worried that if she allowed herself to feel low, then she would have trouble controlling it.  She explained that she experienced several significant losses over the past year, but has never fully grieved them.  She did note that she feels very supported by her husband and step son.  She denied suicidal ideation as well as any history of depression or anxiety, apart from that associated with bereavement or grief.    From a cognitive standpoint, she reported that she is easily distractible, has trouble with memory at times, and has been having difficulty multitasking.    IMPRESSION:  Gwendolyn Morales seems to be experiencing depressed mood secondary to an adjustment disorder to her medical condition and to her other recent life stressors.  There is no evidence of a more pervasive underlying psychiatric condition.  Significant time was spent during today's session identifying her strengths (e.g. perseverance, determination, etc.) to help show her that she will make it through this difficult time.  In addition, she was encouraged to consider  letting her emotions show (e.g. by crying) sometime when she feels comfortable.  The neuropsychologist advised her to allow herself a certain amount of time to feel sad and cry in order to give it boundaries so that she would not feel as though she completely loses control.  She was open to that suggestion, but also expressed fear about trying it.  Gwendolyn Morales expressed interest in participating in individual psychotherapy post-discharge; contact information on providers in her area should be included in her discharge paperwork for that purpose.  Owing to her reported cognitive complaints, it may be beneficial for her to undergo a brief neurocognitive screening prior to discharge, if time allows.  Otherwise, contact information for a neuropsychologist in her area should be included with her discharge paperwork so that she can seek an outpatient evaluation.  Psychoeducation was provided during the current session regarding both depressive symptoms following stroke and neurocognitive recovery in cases of stroke.  Gwendolyn Morales seemed to understand the information as presented.    DIAGNOSIS:   Adjustment disorder with depressed mood  Leavy CellaKaren Willine Schwalbe, Psy.D.  Clinical Neuropsychologist

## 2015-08-02 NOTE — Progress Notes (Signed)
Subjective/Complaints:  #4 trach capped for ~24hrs, no desat overnite  Review of systems negative for shortness of breath, chest pain, nausea vomiting.   Objective: Vital Signs: Blood pressure 124/67, pulse 74, temperature 98.4 F (36.9 C), temperature source Oral, resp. rate 18, weight 106.2 kg (234 lb 2.1 oz), SpO2 97 %. No results found. Results for orders placed or performed during the hospital encounter of 07/24/15 (from the past 72 hour(s))  Creatinine, serum     Status: Abnormal   Collection Time: 07/31/15  5:54 AM  Result Value Ref Range   Creatinine, Ser 2.01 (H) 0.44 - 1.00 mg/dL   GFR calc non Af Amer 28 (L) >60 mL/min   GFR calc Af Amer 32 (L) >60 mL/min    Comment: (NOTE) The eGFR has been calculated using the CKD EPI equation. This calculation has not been validated in all clinical situations. eGFR's persistently <60 mL/min signify possible Chronic Kidney Disease.   Basic metabolic panel     Status: Abnormal   Collection Time: 08/01/15  4:30 AM  Result Value Ref Range   Sodium 133 (L) 135 - 145 mmol/L   Potassium 5.3 (H) 3.5 - 5.1 mmol/L   Chloride 102 101 - 111 mmol/L   CO2 23 22 - 32 mmol/L   Glucose, Bld 89 65 - 99 mg/dL   BUN 45 (H) 6 - 20 mg/dL   Creatinine, Ser 2.29 (H) 0.44 - 1.00 mg/dL   Calcium 10.1 8.9 - 10.3 mg/dL   GFR calc non Af Amer 24 (L) >60 mL/min   GFR calc Af Amer 28 (L) >60 mL/min    Comment: (NOTE) The eGFR has been calculated using the CKD EPI equation. This calculation has not been validated in all clinical situations. eGFR's persistently <60 mL/min signify possible Chronic Kidney Disease.    Anion gap 8 5 - 15     HEENT: #4Shiley, well-placed, no surrounding erythema small amt dried blood on trach sponge Cardio: RRR and no murmurs or extra sounds Resp: CTA B/L and unlabored GI: BS positive and non-distended nontender Extremity:  Pulses positive and No Edema Skin:   Other ttrach site CDI, IV site left arm CDI Neuro:  Alert/Oriented, Cranial Nerve II-XII normal, Normal Sensory, Abnormal Motor 4- /5 bilateral deltoid, biceps, triceps, grip, hip flexor, knee extensor, ankle dorsiflexor and plantar flexor and Abnormal FMC Ataxic/ dec FMC Musc/Skel:  Normal Gen. no acute distress   Assessment/Plan: 1. 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 Physiatrist is providing close team supervision and 24 hour management of active medical problems listed below. Physiatrist and rehab team continue to assess barriers to discharge/monitor patient progress toward functional and medical goals. FIM: Function - Bathing Position: Wheelchair/chair at sink Body parts bathed by patient: Left arm, Right arm, Chest, Abdomen, Right upper leg, Left upper leg Body parts bathed by helper: Right lower leg, Left lower leg, Back, Buttocks, Front perineal area Bathing not applicable: Front perineal area, Buttocks (Completed earlier with nursing per pt) Assist Level: 2 helpers  Function- Upper Body Dressing/Undressing What is the patient wearing?: Pull over shirt/dress Bra - Perfomed by helper: Thread/unthread right bra strap, Thread/unthread left bra strap, Hook/unhook bra (pull down sports bra) Pull over shirt/dress - Perfomed by patient: Thread/unthread left sleeve, Put head through opening, Thread/unthread right sleeve, Pull shirt over trunk Pull over shirt/dress - Perfomed by helper:  Thread/unthread right sleeve, Pull shirt over trunk Assist Level:  (mod assist) Function - Lower Body Dressing/Undressing What is the patient wearing?: Non-skid slipper socks, Pants, Socks Position: Bed Pants- Performed by helper: Thread/unthread right pants leg, Thread/unthread left pants leg, Pull pants up/down Non-skid slipper socks- Performed by patient: Don/doff right  sock Non-skid slipper socks- Performed by helper: Don/doff left sock Assist for footwear: Dependant Assist for lower body dressing: 2 Helpers  Function - Toileting Toileting activity did not occur: No continent bowel/bladder event Toileting steps completed by helper: Adjust clothing prior to toileting, Performs perineal hygiene, Adjust clothing after toileting Assist level: Two helpers  Function - Toilet Transfers Assist level to toilet: 2 helpers Assist level from toilet: 2 helpers  Function - Chair/bed transfer Chair/bed transfer method: Stand pivot Chair/bed transfer assist level: Total assist (Pt < 25%) Chair/bed transfer assistive device: Armrests Mechanical lift: Stedy Chair/bed transfer details: Manual facilitation for placement, Manual facilitation for weight bearing, Verbal cues for technique, Manual facilitation for weight shifting  Function - Locomotion: Wheelchair Will patient use wheelchair at discharge?: Yes Type: Manual Max wheelchair distance: 100 ft Assist Level: Supervision or verbal cues Assist Level: Supervision or verbal cues Assist Level: Supervision or verbal cues Turns around,maneuvers to table,bed, and toilet,negotiates 3% grade,maneuvers on rugs and over doorsills: No Function - Locomotion: Ambulation Ambulation activity did not occur: Safety/medical concerns (unsafe to attempt due to weakness)  Function - Comprehension Comprehension: Auditory Comprehension assist level: Understands basic 90% of the time/cues < 10% of the time  Function - Expression Expression: Verbal Expression assistive device: Talk trach valve Expression assist level: Expresses basic 75 - 89% of the time/requires cueing 10 - 24% of the time. Needs helper to occlude trach/needs to repeat words.  Function - Social Interaction Social Interaction assist level: Interacts appropriately 90% of the time - Needs monitoring or encouragement for participation or interaction.  Function -  Problem Solving Problem solving assist level: Solves basic 75 - 89% of the time/requires cueing 10 - 24% of the time  Function - Memory Memory assist level: Recognizes or recalls 75 - 89% of the time/requires cueing 10 - 24% of the time Patient normally able to recall (first 3 days only): Current season   Medical Problem List and Plan: 1.  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  DVT Prophylaxis/Anticoagulation: Pharmaceutical: Lovenox, given bump in creat will reduce to 70m 3. Pain Management:  Will try low dose ultram prn   4. Mood: team to provide ego support. LCSW to follow for evaluation.   5. Neuropsych: This patient is not capable of making decisions on her own behalf. 6. Skin/Wound Care:  Continue rectal tube for now. Routine pressure relief measures. Maintain adequate nutrition and hydration.   7. Fluids/Electrolytes/Nutrition: Monitor I/O on current diet.  Check lytes in am.  25-50% meal intake 8. VDRF with trach: Continue CFS #6 with ATC.Plan wean to #4 , will ask trach team to eval for decannulation 9. HTN: Continue norvasc, lisinopril, apresoline, metoprolol and HCTZ.  Monitor BP every 6 hours and titrate medications as indicated.  Filed Vitals:   08/01/15 2042 08/02/15 0500  BP: 121/77 124/67  Pulse: 84 74  Temp:  98.4 F (36.9 C)  Resp:  18   10 Hypokalemia: Likely due to diuretics, diarrhea and IVF. Discontinue IVF and increase oral supplement. ARF,  suspect Bactrim +/- ACE I, will D/C  and hydrate with IVF BMP Latest Ref Rng 08/01/2015 07/31/2015 07/25/2015  Glucose 65 - 99 mg/dL 89 - 94  BUN 6 - 20 mg/dL 45(H) - 15  Creatinine 0.44 - 1.00 mg/dL 2.29(H) 2.01(H) 0.76  Sodium 135 - 145 mmol/L 133(L) - 136  Potassium 3.5 - 5.1 mmol/L 5.3(H) - 3.8  Chloride 101 - 111  mmol/L 102 - 103  CO2 22 - 32 mmol/L 23 - 26  Calcium 8.9 - 10.3 mg/dL 10.1 - 9.2    11. ABLA: Monitor for signs of bleeding. Check stool guaiacs.   12. Dysphagia: Monitor tolerance of dysphagia 2, nectar liquids. Needs full supervision at meals. Discontinue IVF.   13. Abnormal LFT:  Discontinue tylenol.? Shock liver, Recheck CMET 14. Anxiety: Monitor on buspar bid.  15. Diarrhea: improved , last BM 6/17 16. Incontinence: UTI K. Pneumoniae, S to Bactrim , completed course  17.  Hypoalbuminemia start Prostat LOS (Days) 9 A FACE TO FACE EVALUATION WAS PERFORMED  KIRSTEINS,ANDREW E 08/02/2015, 8:09 AM

## 2015-08-02 NOTE — Progress Notes (Signed)
Came to assess patient. Stoma site is clean no drainage or oozing noted. Site is clean and dry. No signs of respiratory distress or complications noted at this time.

## 2015-08-02 NOTE — Progress Notes (Addendum)
Physical Therapy Note  Patient Details  Name: Jerre SimonSherry Streb MRN: [id number] Date of Birth: 07/27/1965 Today's Date: 08/02/2015  1345-1420,  35 min individual; 1450-1530, 40 min individual Pain: none noted either tx  tx 1:  PASS performed in pt's room, using hospital bed without rails: score 16/36. Pt left resting in w/c with quick release belt applied and all needs within reach.  tx 2:  Gait with EVA walker x 25', x 40' +3 for steering/controlling RW, and w/c follow.  Knees did not buckle.  Seated neuro re-ed: 20 x 1 calf raises, hip flexion, bil hip abduction against resistance of blue Theraband; standing [place] exs 15 x 1 calf raises, 10 x 1 mini squats, 5 x 1 R hip abduction (unable with RLE).  Pt required seated rest breaks between each ex..  Pt left resting in w/c with quick release belt applied and all needs within reach.  Feliberto Stockley 08/02/2015, 7:58 AM

## 2015-08-02 NOTE — Progress Notes (Signed)
Speech Language Pathology Daily Session Note  Patient Details  Name: Gwendolyn Morales MRN: [id number] Date of Birth: 1966/01/07  Today's Date: 08/02/2015 SLP Individual Time: 1100-1200 SLP Individual Time Calculation (min): 60 min  Short Term Goals: Week 2: SLP Short Term Goal 1 (Week 2): Pt will consume therapeutic trials of thin liquids with supervision cues for use of swallowing precautions and minimal overt s/s of aspiration  SLP Short Term Goal 2 (Week 2): Pt will be intelligible at the sentence level in 75% of opportunities with supervision cues for pacing, overarticulation, and increased vocal intensity.  SLP Short Term Goal 3 (Week 2): Pt will sustain her attention to basic, tasks for 20 minutes with mod I redirection to task  SLP Short Term Goal 4 (Week 2): Pt will recall basic, daily information with supervision assist verbal cues for use of external aids.   SLP Short Term Goal 5 (Week 2): Pt will monitor and correct verbal errors in 75% of opportunities with supervision verbal cues.   Skilled Therapeutic Interventions:  Pt was seen for skilled ST targeting goals for speech intelligibility and dysphagia.  SLP facilitated the session with a verbal picture sequencing task targeting use of intelligibility strategies. Pt required max assist verbal cues following re-instruction to recall 4 out of 4 speech intelligibility strategies.  Pt required mod assist verbal cues to utilize overarticulation, increased vocal intensity, and slow rate to achieve intelligibility at the sentence level during the abovementioned task to describe actions in pictures.  SLP also facilitated the session with a trial tray of dys 2 textures with thin liquids to continue working towards diet progression. Pt utilized chin tuck when consuming thin liquids with min faded to supervision verbal cues.  Strong reflexive cough noted x1.  Given pt's improved use of swallowing precautions and decreased s/s of aspiration in  comparison to previous therapy sessions, recommend diet advancement at this time.  Continue per current plan of care.    Function:  Eating Eating   Modified Consistency Diet: Yes Eating Assist Level: Supervision or verbal cues          Cognition Comprehension Comprehension assist level: Understands basic 90% of the time/cues < 10% of the time  Expression   Expression assist level: Expresses basic 75 - 89% of the time/requires cueing 10 - 24% of the time. Needs helper to occlude trach/needs to repeat words.  Social Interaction Social Interaction assist level: Interacts appropriately 90% of the time - Needs monitoring or encouragement for participation or interaction.  Problem Solving Problem solving assist level: Solves basic 50 - 74% of the time/requires cueing 25 - 49% of the time  Memory Memory assist level: Recognizes or recalls 50 - 74% of the time/requires cueing 25 - 49% of the time    Pain Pain Assessment Pain Assessment: No/denies pain  Therapy/Group: Individual Therapy  Leafy Motsinger, Melanee SpryNicole L 08/02/2015, 3:59 PM

## 2015-08-02 NOTE — Progress Notes (Signed)
Social Work Patient ID: Gwendolyn Morales, female   DOB: [birth year], 50 y.o.   MRN: [id number] Met with pt and spoke with husband via telephone to discuss team conference goals-min assist level and target discharge date 7/7. Discussed pt's physical care needs and if son would be comfortable with this while he is at work. Husband reports he helped pt with her Mom when she was ill at their Home. He will come in for education and will have son also. Will work toward discharge needs. Pt is pleased with her progress and hopeful to get her trach out next week.

## 2015-08-03 ENCOUNTER — Inpatient Hospital Stay (HOSPITAL_COMMUNITY): Payer: Self-pay | Admitting: *Deleted

## 2015-08-03 ENCOUNTER — Inpatient Hospital Stay (HOSPITAL_COMMUNITY): Payer: Self-pay | Admitting: Occupational Therapy

## 2015-08-03 ENCOUNTER — Inpatient Hospital Stay (HOSPITAL_COMMUNITY): Payer: Self-pay | Admitting: Speech Pathology

## 2015-08-03 LAB — BASIC METABOLIC PANEL
ANION GAP: 8 (ref 5–15)
BUN: 42 mg/dL — ABNORMAL HIGH (ref 6–20)
CHLORIDE: 103 mmol/L (ref 101–111)
CO2: 23 mmol/L (ref 22–32)
Calcium: 10.1 mg/dL (ref 8.9–10.3)
Creatinine, Ser: 1.85 mg/dL — ABNORMAL HIGH (ref 0.44–1.00)
GFR calc Af Amer: 36 mL/min — ABNORMAL LOW (ref >60)
GFR, EST NON AFRICAN AMERICAN: 31 mL/min — AB (ref >60)
GLUCOSE: 96 mg/dL (ref 65–99)
POTASSIUM: 4.5 mmol/L (ref 3.5–5.1)
Sodium: 134 mmol/L — ABNORMAL LOW (ref 135–145)

## 2015-08-03 NOTE — Progress Notes (Signed)
Occupational Therapy Session Note  Patient Details  Name: Jerre SimonSherry Valdivia MRN: [id number] Date of Birth: [birth year]  Today's Date: 08/03/2015 OT Individual Time: [id number] OT Individual Time Calculation (min): 30 min    Skilled Therapeutic Interventions/Progress Updates:    Pt completed wheelchair mobility to the therapy gym with supervision and increased time, using just UEs.  She was able to work on [facility] to stand [facility] with min to mod assist while engaged in functional reaching tasks.  Worked on squat to stand while incorporating reaching with both the LUE and RUE.  At times pt needing max assist to return to standing from squating position with pt actually resting on therapist's knee.  Pt completed several intervals before transferring back to wheelchair with use of the RW and min assist.  Noted improved ability to maintain equal weightbearing over both LEs during this session.    Therapy Documentation Precautions:  Precautions Precautions: Fall Precaution Comments: trach plugged for therapy Restrictions Weight Bearing Restrictions: No  Pain: Pain Assessment Pain Assessment: No/denies pain ADL: See Function Navigator for Current Functional Status.   Therapy/Group: Individual Therapy  Ikesha Siller OTR/L 08/03/2015, 4:14 PM

## 2015-08-03 NOTE — Progress Notes (Signed)
Speech Language Pathology Daily Session Note  Patient Details  Name: Gwendolyn Morales MRN: [id number] Date of Birth: 1965/06/03  Today's Date: 08/03/2015 SLP Individual Time: 0800-0900 SLP Individual Time Calculation (min): 60 min  Short Term Goals: Week 2: SLP Short Term Goal 1 (Week 2): Pt will consume therapeutic trials of thin liquids with supervision cues for use of swallowing precautions and minimal overt s/s of aspiration  SLP Short Term Goal 2 (Week 2): Pt will be intelligible at the sentence level in 75% of opportunities with supervision cues for pacing, overarticulation, and increased vocal intensity.  SLP Short Term Goal 3 (Week 2): Pt will sustain her attention to basic, tasks for 20 minutes with mod I redirection to task  SLP Short Term Goal 4 (Week 2): Pt will recall basic, daily information with supervision assist verbal cues for use of external aids.   SLP Short Term Goal 5 (Week 2): Pt will monitor and correct verbal errors in 75% of opportunities with supervision verbal cues.   Skilled Therapeutic Interventions:  Pt was seen for skilled ST targeting goals for dysphagia and cognition.  Pt consumed presentations of dys 2 textures and thin liquids with supervision verbal cues for use of swallowing precautions and no overt s/s of aspiration.  SLP also facilitated the session with medication and money management subtests of ALFA standardized cognitive assessment to target cognitive goals.  Pt was 80% accurate for counting money and making change, which improved to 100% accuracy with min assist verbal cues for organization and use of calculator to double check for errors.  Pt also required supervision verbal cues to organize pills of varying dosages and frequencies in a medicine chart for 100% accuracy.  Pt was returned to room and left in wheelchair with quick release belt donned and call bell left within reach.  Continue per current plan of care.    Function:  Eating Eating    Modified Consistency Diet: Yes Eating Assist Level: Supervision or verbal cues           Cognition Comprehension Comprehension assist level: Understands basic 90% of the time/cues < 10% of the time  Expression   Expression assist level: Expresses basic 75 - 89% of the time/requires cueing 10 - 24% of the time. Needs helper to occlude trach/needs to repeat words.  Social Interaction Social Interaction assist level: Interacts appropriately 90% of the time - Needs monitoring or encouragement for participation or interaction.  Problem Solving Problem solving assist level: Solves basic 75 - 89% of the time/requires cueing 10 - 24% of the time  Memory Memory assist level: Recognizes or recalls 50 - 74% of the time/requires cueing 25 - 49% of the time    Pain Pain Assessment Pain Assessment: No/denies pain  Therapy/Group: Individual Therapy  [name], Joni ReiningNicole L 08/03/2015, 9:00 AM

## 2015-08-03 NOTE — Progress Notes (Signed)
Subjective/Complaints:  Decannulated Discussed need for IVF Discussed increased fluid intake, upgraded to thins 6/22  Review of systems negative for shortness of breath, chest pain, nausea vomiting.   Objective: Vital Signs: Blood pressure 132/81, pulse 85, temperature 98.3 F (36.8 C), temperature source Oral, resp. rate 17, weight 105.6 kg (232 lb 12.9 oz), SpO2 96 %. No results found. Results for orders placed or performed during the hospital encounter of 07/24/15 (from the past 72 hour(s))  Basic metabolic panel     Status: Abnormal   Collection Time: 08/01/15  4:30 AM  Result Value Ref Range   Sodium 133 (L) 135 - 145 mmol/L   Potassium 5.3 (H) 3.5 - 5.1 mmol/L   Chloride 102 101 - 111 mmol/L   CO2 23 22 - 32 mmol/L   Glucose, Bld 89 65 - 99 mg/dL   BUN 45 (H) 6 - 20 mg/dL   Creatinine, Ser 2.29 (H) 0.44 - 1.00 mg/dL   Calcium 10.1 8.9 - 10.3 mg/dL   GFR calc non Af Amer 24 (L) >60 mL/min   GFR calc Af Amer 28 (L) >60 mL/min    Comment: (NOTE) The eGFR has been calculated using the CKD EPI equation. This calculation has not been validated in all clinical situations. eGFR's persistently <60 mL/min signify possible Chronic Kidney Disease.    Anion gap 8 5 - 15  Basic metabolic panel     Status: Abnormal   Collection Time: 08/02/15  7:15 AM  Result Value Ref Range   Sodium 133 (L) 135 - 145 mmol/L   Potassium 4.7 3.5 - 5.1 mmol/L   Chloride 101 101 - 111 mmol/L   CO2 21 (L) 22 - 32 mmol/L   Glucose, Bld 88 65 - 99 mg/dL   BUN 44 (H) 6 - 20 mg/dL   Creatinine, Ser 2.09 (H) 0.44 - 1.00 mg/dL   Calcium 10.3 8.9 - 10.3 mg/dL   GFR calc non Af Amer 27 (L) >60 mL/min   GFR calc Af Amer 31 (L) >60 mL/min    Comment: (NOTE) The eGFR has been calculated using the CKD EPI equation. This calculation has not been validated in all clinical situations. eGFR's persistently <60 mL/min signify possible Chronic Kidney Disease.    Anion gap 11 5 - 15  Basic metabolic panel      Status: Abnormal   Collection Time: 08/03/15  5:54 AM  Result Value Ref Range   Sodium 134 (L) 135 - 145 mmol/L   Potassium 4.5 3.5 - 5.1 mmol/L   Chloride 103 101 - 111 mmol/L   CO2 23 22 - 32 mmol/L   Glucose, Bld 96 65 - 99 mg/dL   BUN 42 (H) 6 - 20 mg/dL   Creatinine, Ser 1.85 (H) 0.44 - 1.00 mg/dL   Calcium 10.1 8.9 - 10.3 mg/dL   GFR calc non Af Amer 31 (L) >60 mL/min   GFR calc Af Amer 36 (L) >60 mL/min    Comment: (NOTE) The eGFR has been calculated using the CKD EPI equation. This calculation has not been validated in all clinical situations. eGFR's persistently <60 mL/min signify possible Chronic Kidney Disease.    Anion gap 8 5 - 15     HEENT: #4Shiley, well-placed, no surrounding erythema small amt dried blood on trach sponge Cardio: RRR and no murmurs or extra sounds Resp: CTA B/L and unlabored GI: BS positive and non-distended nontender Extremity:  Pulses positive and No Edema Skin:   Other ttrach site  CDI, IV site left arm CDI Neuro: Alert/Oriented, Cranial Nerve II-XII normal, Normal Sensory, Abnormal Motor 4- /5 bilateral deltoid, biceps, triceps, grip, hip flexor, knee extensor, ankle dorsiflexor and plantar flexor and Abnormal FMC Ataxic/ dec FMC Musc/Skel:  Normal Gen. no acute distress   Assessment/Plan: 1. 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 Physiatrist is providing close team supervision and 24 hour management of active medical problems listed below. Physiatrist and rehab team continue to assess barriers to discharge/monitor patient progress toward functional and medical goals. FIM: Function - Bathing Position: Wheelchair/chair at sink Body parts bathed by patient: Left arm, Right arm, Chest, Abdomen, Right upper leg, Left upper leg Body parts bathed by helper: Right lower leg,  Left lower leg, Back, Buttocks, Front perineal area Bathing not applicable: Front perineal area, Buttocks (Completed earlier with nursing per pt) Assist Level: 2 helpers  Function- Upper Body Dressing/Undressing What is the patient wearing?: Pull over shirt/dress Bra - Perfomed by helper: Thread/unthread right bra strap, Thread/unthread left bra strap, Hook/unhook bra (pull down sports bra) Pull over shirt/dress - Perfomed by patient: Thread/unthread left sleeve, Put head through opening, Thread/unthread right sleeve, Pull shirt over trunk Pull over shirt/dress - Perfomed by helper: Thread/unthread right sleeve, Pull shirt over trunk Assist Level:  (mod assist) Function - Lower Body Dressing/Undressing What is the patient wearing?: Non-skid slipper socks, Pants, Socks Position: Bed Pants- Performed by helper: Thread/unthread right pants leg, Thread/unthread left pants leg, Pull pants up/down Non-skid slipper socks- Performed by patient: Don/doff right sock Non-skid slipper socks- Performed by helper: Don/doff left sock Assist for footwear: Dependant Assist for lower body dressing: 2 Helpers  Function - Toileting Toileting activity did not occur: No continent bowel/bladder event Toileting steps completed by patient: Adjust clothing prior to toileting Toileting steps completed by helper: Performs perineal hygiene, Adjust clothing after toileting Toileting Assistive Devices: Grab bar or rail Assist level: Touching or steadying assistance (Pt.75%)  Function - Toilet Transfers Assist level to toilet: Touching or steadying assistance (Pt > 75%) (stedy) Assist level from toilet: Maximal assist (Pt 25 - 49%/lift and lower) (stedy)  Function - Chair/bed transfer Chair/bed transfer method: Stand pivot Chair/bed transfer assist level: Moderate assist (Pt 50 - 74%/lift or lower) Chair/bed transfer assistive device: Armrests Mechanical lift: Stedy Chair/bed transfer details: Manual facilitation  for placement, Manual facilitation for weight bearing, Verbal cues for technique, Manual facilitation for weight shifting  Function - Locomotion: Wheelchair Will patient use wheelchair at discharge?: Yes Type: Manual Max wheelchair distance: 50 Assist Level: Supervision or verbal cues Assist Level: Supervision or verbal cues Assist Level: Supervision or verbal cues Turns around,maneuvers to table,bed, and toilet,negotiates 3% grade,maneuvers on rugs and over doorsills: No Function - Locomotion: Ambulation Ambulation activity did not occur: Safety/medical concerns (unsafe to attempt due to weakness) Assistive device: Ethelene Hal Max distance: 40 Assist level: 2 helpers Assist level: 2 helpers  Function - Comprehension Comprehension: Auditory Comprehension assist level: Understands basic 90% of the time/cues < 10% of the time  Function - Expression Expression: Verbal Expression assistive device: Talk trach valve Expression assist level: Expresses basic 75 - 89% of the time/requires cueing 10 - 24% of the time. Needs helper to occlude trach/needs to repeat words.  Function - Social Interaction Social Interaction assist level: Interacts appropriately 90% of the time - Needs monitoring or encouragement for participation or interaction.  Function -  Problem Solving Problem solving assist level: Solves complex 90% of the time/cues < 10% of the time  Function - Memory Memory assist level: More than reasonable amount of time Patient normally able to recall (first 3 days only): Current season   Medical Problem List and Plan: 1.  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  DVT Prophylaxis/Anticoagulation: Pharmaceutical: Lovenox, given bump in creat will reduce to 6m 3. Pain Management:   Will try low dose ultram prn   4. Mood: team to provide ego support. LCSW to follow for evaluation.   5. Neuropsych: This patient is not capable of making decisions on her own behalf. 6. Skin/Wound Care:  Continue rectal tube for now. Routine pressure relief measures. Maintain adequate nutrition and hydration.   7. Fluids/Electrolytes/Nutrition: Monitor I/O on current diet.  Check lytes in am. 8. VDRF with trach: now decannulated 9. HTN: Continue norvasc, lisinopril, apresoline, metoprolol and HCTZ.  Monitor BP every 6 hours and titrate medications as indicated.  Filed Vitals:   08/02/15 2104 08/03/15 0528  BP: 115/82 132/81  Pulse:  85  Temp:  98.3 F (36.8 C)  Resp:  17   10 ARF- improving off bactrim and lisinopril BMP Latest Ref Rng 08/03/2015 08/02/2015 08/01/2015  Glucose 65 - 99 mg/dL 96 88 89  BUN 6 - 20 mg/dL 42(H) 44(H) 45(H)  Creatinine 0.44 - 1.00 mg/dL 1.85(H) 2.09(H) 2.29(H)  Sodium 135 - 145 mmol/L 134(L) 133(L) 133(L)  Potassium 3.5 - 5.1 mmol/L 4.5 4.7 5.3(H)  Chloride 101 - 111 mmol/L 103 101 102  CO2 22 - 32 mmol/L 23 21(L) 23  Calcium 8.9 - 10.3 mg/dL 10.1 10.3 10.1    11. ABLA: Monitor for signs of bleeding. Recheck CBC  CBC Latest Ref Rng 07/25/2015 07/24/2015 07/23/2015  WBC 4.0 - 10.5 K/uL 9.0 8.7 7.5  Hemoglobin 12.0 - 15.0 g/dL 9.4(L) 9.4(L) 10.3(L)  Hematocrit 36.0 - 46.0 % 31.3(L) 31.1(L) 34.0(L)  Platelets 150 - 400 K/uL 412(H) 387 330      12. Dysphagia: Monitor tolerance of dysphagia 2,Upgraded to thin liquid yesterday 13. Abnormal LFT:  Discontinue tylenol.? Shock liver, Recheck CMET 14. Anxiety: Monitor on buspar bid.  15. Diarrhea: improved , formed cont stool x 1 6/22  16.  Hypoalbuminemia start Prostat LOS (Days) 10 A FACE TO FACE EVALUATION WAS PERFORMED  Analaura Messler E 08/03/2015, 7:25 AM

## 2015-08-03 NOTE — Progress Notes (Signed)
Occupational Therapy Session Note  Patient Details  Name: Gwendolyn Morales MRN: [id number] Date of Birth: 04/23/1965  Today's Date: 08/03/2015 OT Individual Time: 1300-1400 OT Individual Time Calculation (min): 60 min   Short Term Goals: Week 1:  OT Short Term Goal 1 (Week 1): Pt will maintain static/dynamic sittting EOC during UB dressing with supervision. OT Short Term Goal 1 - Progress (Week 1): Met OT Short Term Goal 2 (Week 1): Pt will complete LB bathing sit to stand with max assist of 1. OT Short Term Goal 2 - Progress (Week 1): Met OT Short Term Goal 3 (Week 1): Pt will complete UB dressing in unsupported sitting with supervision. OT Short Term Goal 3 - Progress (Week 1): Met OT Short Term Goal 4 (Week 1): Pt will transfer from supine to sit EOB in preparation for selfcare tasks with supervision.  OT Short Term Goal 4 - Progress (Week 1): Not met OT Short Term Goal 5 (Week 1): Pt will perform squat pivot transfer to drop arm commode with max assist.  OT Short Term Goal 5 - Progress (Week 1): Met   Week 2:  OT Short Term Goal 1 (Week 2): Pt will transfer from supine to sit EOB in preparation for selfcare tasks with supervision.  OT Short Term Goal 2 (Week 2): Pt will complete LB bathing sit to stand with mod assist 2 consecutive sessions. OT Short Term Goal 3 (Week 2): Pt will transfer stand pivot to the 3:1 with mod assist.   OT Short Term Goal 4 (Week 2): Pt will complete tub/shower transfers with mod assist stand pivot with RW.  Skilled Therapeutic Interventions/Progress Updates:  Patient found seated in w/c with no complaints of pain. Pt propelled self from room to dayroom, taking increased time and needing mod encouragement. Pt sat in w/c in dayroom and engaged in brain teaser activity word search, also focusing on handwriting. Pt also engaged in handwriting activity and therapist gave pt homework to complete. Administered coloring activity for patient as well and encouraged her  to complete this on her own. For handwriting and coloring, encouraged pt to stay in the lines and and be more precise as pt states her handwriting is "awful now". Pt propelled self back to room and therapist left pt seated in w/c with quick release belt donned and all needs within reach.   Therapy Documentation Precautions:  Precautions Precautions: Fall Precaution Comments: trach plugged for therapy Restrictions Weight Bearing Restrictions: No  Vital Signs: Therapy Vitals Temp: 98.3 F (36.8 C) Temp Source: Oral Pulse Rate: 85 Resp: 17 BP: 132/81 mmHg Patient Position (if appropriate): Lying Oxygen Therapy SpO2: 96 % O2 Device: Not Delivered  See Function Navigator for Current Functional Status.  Therapy/Group: Individual Therapy  Patricia Clay , MS, OTR/L, CLT  08/03/2015, 2:00 PM  

## 2015-08-03 NOTE — Progress Notes (Signed)
Physical Therapy Session Note  Patient Details  Name: Gwendolyn Morales MRN: [id number] Date of Birth: [birth year]  Today's Date: 08/03/2015 PT Individual Time: 1130-1215 PT Individual Time Calculation (min): 45 min   Short Term Goals: Week 2:  PT Short Term Goal 1 (Week 2): pt will move supine>< sit with min assist PT Short Term Goal 2 (Week 2): pt will transfer with mod assist consistently PT Short Term Goal 3 (Week 2): pt will perform gait iwth LRAD and and assistance of 1 person, x 100' PT Short Term Goal 4 (Week 2): pt will ascend/descend 4 steps 2 rails with mod assist  Skilled Therapeutic Interventions/Progress Updates:  Tx focused on functional mobility training, gait with Carley HammedEva walker and RW, stairs, and therex for LE strengthening and activity tolerance. [facility].  Pt propelled WC x120' with S in controlled setting, cues for turning technique.  Sit><Stand at [facility] walk[facility], stairs, and RW with Mod A for lifting. Cues and manual facilitation for anterior translation over BOS to stand.   Gait with Carley HammedEva walker x40' with +2 for WC follow and Mod A for steering, distance limited by LE fatigue.  Gait with RW x25' with +2 WC follow and Mod A for steadying. Pt performed well with demonstration and cues for RW management. No evidence of knee buckling, but pt reoprting feeling LEs "ready to give out."  Stair training x3 with bil rails and Mod A for steadying following demo and cues for sequence.   Pt instructed in seated marching and LAQ 2x10 each with cues for full ROM. Unable to complete supine therex per handout this tx due to time constraints.  Basic WC back provided for trunk support.   [facility], lap belt and all needs in reach.       Therapy Documentation Precautions:  Precautions Precautions: Fall Precaution Comments: trach plugged for therapy Restrictions Weight Bearing Restrictions: No    Pain: Pain Assessment Pain Assessment: No/denies  pain   See Function Navigator for Current Functional Status.   Therapy/Group: Individual Therapy  Tomicka Lover, Chrisandra NettersOLE M  Saif Peter, PT, DPT  08/03/2015, 11:59 AM

## 2015-08-04 ENCOUNTER — Inpatient Hospital Stay (HOSPITAL_COMMUNITY): Payer: Self-pay | Admitting: Occupational Therapy

## 2015-08-04 LAB — BASIC METABOLIC PANEL
ANION GAP: 8 (ref 5–15)
BUN: 40 mg/dL — AB (ref 6–20)
CALCIUM: 9.9 mg/dL (ref 8.9–10.3)
CO2: 22 mmol/L (ref 22–32)
CREATININE: 1.67 mg/dL — AB (ref 0.44–1.00)
Chloride: 101 mmol/L (ref 101–111)
GFR calc Af Amer: 41 mL/min — ABNORMAL LOW (ref >60)
GFR, EST NON AFRICAN AMERICAN: 35 mL/min — AB (ref >60)
GLUCOSE: 94 mg/dL (ref 65–99)
Potassium: 4 mmol/L (ref 3.5–5.1)
Sodium: 131 mmol/L — ABNORMAL LOW (ref 135–145)

## 2015-08-04 NOTE — Progress Notes (Signed)
Subjective/Complaints:  Decannulated Discussed need for IVF Discussed increased fluid intake, upgraded to thins 6/22  Review of systems negative for shortness of breath, chest pain, nausea vomiting.   Objective: Vital Signs: Blood pressure 134/71, pulse 72, temperature 98 F (36.7 C), temperature source Oral, resp. rate 18, weight 103.9 kg (229 lb 0.9 oz), SpO2 97 %. No results found. Results for orders placed or performed during the hospital encounter of 07/24/15 (from the past 72 hour(s))  Basic metabolic panel     Status: Abnormal   Collection Time: 08/02/15  7:15 AM  Result Value Ref Range   Sodium 133 (L) 135 - 145 mmol/L   Potassium 4.7 3.5 - 5.1 mmol/L   Chloride 101 101 - 111 mmol/L   CO2 21 (L) 22 - 32 mmol/L   Glucose, Bld 88 65 - 99 mg/dL   BUN 44 (H) 6 - 20 mg/dL   Creatinine, Ser 2.09 (H) 0.44 - 1.00 mg/dL   Calcium 10.3 8.9 - 10.3 mg/dL   GFR calc non Af Amer 27 (L) >60 mL/min   GFR calc Af Amer 31 (L) >60 mL/min    Comment: (NOTE) The eGFR has been calculated using the CKD EPI equation. This calculation has not been validated in all clinical situations. eGFR's persistently <60 mL/min signify possible Chronic Kidney Disease.    Anion gap 11 5 - 15  Basic metabolic panel     Status: Abnormal   Collection Time: 08/03/15  5:54 AM  Result Value Ref Range   Sodium 134 (L) 135 - 145 mmol/L   Potassium 4.5 3.5 - 5.1 mmol/L   Chloride 103 101 - 111 mmol/L   CO2 23 22 - 32 mmol/L   Glucose, Bld 96 65 - 99 mg/dL   BUN 42 (H) 6 - 20 mg/dL   Creatinine, Ser 1.85 (H) 0.44 - 1.00 mg/dL   Calcium 10.1 8.9 - 10.3 mg/dL   GFR calc non Af Amer 31 (L) >60 mL/min   GFR calc Af Amer 36 (L) >60 mL/min    Comment: (NOTE) The eGFR has been calculated using the CKD EPI equation. This calculation has not been validated in all clinical situations. eGFR's persistently <60 mL/min signify possible Chronic Kidney Disease.    Anion gap 8 5 - 15  Basic metabolic panel      Status: Abnormal   Collection Time: 08/04/15  5:40 AM  Result Value Ref Range   Sodium 131 (L) 135 - 145 mmol/L   Potassium 4.0 3.5 - 5.1 mmol/L   Chloride 101 101 - 111 mmol/L   CO2 22 22 - 32 mmol/L   Glucose, Bld 94 65 - 99 mg/dL   BUN 40 (H) 6 - 20 mg/dL   Creatinine, Ser 1.67 (H) 0.44 - 1.00 mg/dL   Calcium 9.9 8.9 - 10.3 mg/dL   GFR calc non Af Amer 35 (L) >60 mL/min   GFR calc Af Amer 41 (L) >60 mL/min    Comment: (NOTE) The eGFR has been calculated using the CKD EPI equation. This calculation has not been validated in all clinical situations. eGFR's persistently <60 mL/min signify possible Chronic Kidney Disease.    Anion gap 8 5 - 15     HEENT: trach site with granulation tissue no erythema, epithelium not closed Cardio: RRR and no murmurs or extra sounds Resp: CTA B/L and unlabored GI: BS positive and non-distended nontender Extremity:  Pulses positive and No Edema Skin:   Other ttrach site CDI, IV site  left arm CDI Neuro: Alert/Oriented, Cranial Nerve II-XII normal, Normal Sensory, Abnormal Motor 4- /5 bilateral deltoid, biceps, triceps, grip, hip flexor, knee extensor, ankle dorsiflexor and plantar flexor and Abnormal FMC Ataxic/ dec FMC Musc/Skel:  Normal Gen. no acute distress   Assessment/Plan: 1. 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 Physiatrist is providing close team supervision and 24 hour management of active medical problems listed below. Physiatrist and rehab team continue to assess barriers to discharge/monitor patient progress toward functional and medical goals. FIM: Function - Bathing Position: Wheelchair/chair at sink Body parts bathed by patient: Left arm, Right arm, Chest, Abdomen, Right upper leg, Left upper leg Body parts bathed by helper: Right lower leg, Left lower leg,  Back, Buttocks, Front perineal area Bathing not applicable: Front perineal area, Buttocks (Completed earlier with nursing per pt) Assist Level: 2 helpers  Function- Upper Body Dressing/Undressing What is the patient wearing?: Pull over shirt/dress Bra - Perfomed by helper: Thread/unthread right bra strap, Thread/unthread left bra strap, Hook/unhook bra (pull down sports bra) Pull over shirt/dress - Perfomed by patient: Thread/unthread left sleeve, Put head through opening, Thread/unthread right sleeve, Pull shirt over trunk Pull over shirt/dress - Perfomed by helper: Thread/unthread right sleeve, Pull shirt over trunk Assist Level:  (mod assist) Function - Lower Body Dressing/Undressing What is the patient wearing?: Non-skid slipper socks, Pants, Socks Position: Bed Pants- Performed by helper: Thread/unthread right pants leg, Thread/unthread left pants leg, Pull pants up/down Non-skid slipper socks- Performed by patient: Don/doff right sock Non-skid slipper socks- Performed by helper: Don/doff left sock Assist for footwear: Dependant Assist for lower body dressing: 2 Helpers  Function - Toileting Toileting activity did not occur: No continent bowel/bladder event Toileting steps completed by patient: Adjust clothing prior to toileting Toileting steps completed by helper: Adjust clothing prior to toileting, Adjust clothing after toileting, Performs perineal hygiene Toileting Assistive Devices: Grab bar or rail Assist level: Touching or steadying assistance (Pt.75%)  Function - Toilet Transfers Assist level to toilet: Touching or steadying assistance (Pt > 75%) (stedy) Assist level from toilet: Maximal assist (Pt 25 - 49%/lift and lower) (stedy)  Function - Chair/bed transfer Chair/bed transfer method: Stand pivot Chair/bed transfer assist level: Moderate assist (Pt 50 - 74%/lift or lower) Chair/bed transfer assistive device: Armrests Mechanical lift: Stedy Chair/bed transfer details:  Manual facilitation for placement, Manual facilitation for weight bearing, Verbal cues for technique, Manual facilitation for weight shifting  Function - Locomotion: Wheelchair Will patient use wheelchair at discharge?: Yes Type: Manual Max wheelchair distance: 125 Assist Level: Supervision or verbal cues Assist Level: Supervision or verbal cues Wheel 150 feet activity did not occur: Safety/medical concerns Assist Level: Supervision or verbal cues Turns around,maneuvers to table,bed, and toilet,negotiates 3% grade,maneuvers on rugs and over doorsills: No Function - Locomotion: Ambulation Ambulation activity did not occur: Safety/medical concerns (unsafe to attempt due to weakness) Assistive device: Ethelene Hal Max distance: 40 Assist level: 2 helpers Assist level: 2 helpers Walk 50 feet with 2 turns activity did not occur: Safety/medical concerns Walk 150 feet activity did not occur: Safety/medical concerns Walk 10 feet on uneven surfaces activity did not occur: Safety/medical concerns  Function - Comprehension Comprehension: Auditory Comprehension assist level: Understands basic 90% of the time/cues < 10% of the time  Function - Expression Expression: Verbal Expression assistive device: Talk trach valve Expression assist level: Expresses basic 75 - 89%  of the time/requires cueing 10 - 24% of the time. Needs helper to occlude trach/needs to repeat words.  Function - Social Interaction Social Interaction assist level: Interacts appropriately 90% of the time - Needs monitoring or encouragement for participation or interaction.  Function - Problem Solving Problem solving assist level: Solves basic 75 - 89% of the time/requires cueing 10 - 24% of the time  Function - Memory Memory assist level: Recognizes or recalls 75 - 89% of the time/requires cueing 10 - 24% of the time Patient normally able to recall (first 3 days only): Current season   Medical Problem List and Plan: 1.   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  DVT Prophylaxis/Anticoagulation: Pharmaceutical: Lovenox, given bump in creat will reduce to 98m 3. Pain Management:  Will try low dose ultram prn   4. Mood: team to provide ego support. LCSW to follow for evaluation.   5. Neuropsych: This patient is not capable of making decisions on her own behalf. 6. Skin/Wound Care:  Continue rectal tube for now. Routine pressure relief measures. Maintain adequate nutrition and hydration.   7. Fluids/Electrolytes/Nutrition: Monitor I/O on current diet.  Check lytes in am. 8. VDRF with trach: now decannulated 9. HTN: Continue norvasc, lisinopril, apresoline, metoprolol and HCTZ.  Monitor BP every 6 hours and titrate medications as indicated.  Filed Vitals:   08/03/15 2029 08/04/15 0527  BP: 129/83 134/71  Pulse:  72  Temp:  98 F (36.7 C)  Resp:  18   10 ARF- improving off bactrim and lisinopril BMP Latest Ref Rng 08/04/2015 08/03/2015 08/02/2015  Glucose 65 - 99 mg/dL 94 96 88  BUN 6 - 20 mg/dL 40(H) 42(H) 44(H)  Creatinine 0.44 - 1.00 mg/dL 1.67(H) 1.85(H) 2.09(H)  Sodium 135 - 145 mmol/L 131(L) 134(L) 133(L)  Potassium 3.5 - 5.1 mmol/L 4.0 4.5 4.7  Chloride 101 - 111 mmol/L 101 103 101  CO2 22 - 32 mmol/L 22 23 21(L)  Calcium 8.9 - 10.3 mg/dL 9.9 10.1 10.3    11. ABLA: Monitor for signs of bleeding. Recheck CBC  CBC Latest Ref Rng 07/25/2015 07/24/2015 07/23/2015  WBC 4.0 - 10.5 K/uL 9.0 8.7 7.5  Hemoglobin 12.0 - 15.0 g/dL 9.4(L) 9.4(L) 10.3(L)  Hematocrit 36.0 - 46.0 % 31.3(L) 31.1(L) 34.0(L)  Platelets 150 - 400 K/uL 412(H) 387 330      12. Dysphagia: Monitor tolerance of dysphagia 2,Upgraded to thin liquid yesterday 13. Abnormal LFT:  Discontinue tylenol.? Shock liver, Recheck CMET 14. Anxiety:  Monitor on buspar bid.  15. Diarrhea: improved , formed cont stool x 1 6/22  16.  Hypoalbuminemia start Prostat LOS (Days) 11 A FACE TO FACE EVALUATION WAS PERFORMED  Gwendolyn Morales E 08/04/2015, 8:02 AM

## 2015-08-04 NOTE — Plan of Care (Signed)
Problem: RH Tub/Shower Transfers Goal: LTG Patient will perform tub/shower transfers w/assist (OT) LTG: Patient will perform tub/shower transfers with assist, with/without cues using equipment (OT)  Goals upgraded based on faster progress than anticipated.

## 2015-08-04 NOTE — Progress Notes (Signed)
Occupational Therapy Session Note  Patient Details  Name: Gwendolyn SimonSherry Morales MRN: [id number] Date of Birth: 01/29/1966  Today's Date: 08/04/2015 OT Individual Time: 1002-1100 OT Individual Time Calculation (min): 58 min    Short Term Goals: Week 2:  OT Short Term Goal 1 (Week 2): Pt will transfer from supine to sit EOB in preparation for selfcare tasks with supervision.  OT Short Term Goal 2 (Week 2): Pt will complete LB bathing sit to stand with mod assist 2 consecutive sessions. OT Short Term Goal 3 (Week 2): Pt will transfer stand pivot to the 3:1 with mod assist.   OT Short Term Goal 4 (Week 2): Pt will complete tub/shower transfers with mod assist stand pivot with RW.  Skilled Therapeutic Interventions/Progress Updates:    Pt ambulated from the bedside recliner to the shower bench with min assist using the RW.  She was able to shower with min assist sit to stand on the bench.  LH sponge provided for washing lower legs and feet as she could not cross them up over her knee secondary to limited space and weakness.  Min assist stand pivot for transfer back to the wheelchair stand pivot.  She was able to complete all dressing with min assist sit to stand.  She uses her UEs to assist with crossing each leg over the opposite knee to donn all LB clothing.  Pt finished grooming tasks in sitting with supervision before transitioning back to the side of the bed with wheelchair in place.  Call button and safety belt in place.   Therapy Documentation Precautions:  Precautions Precautions: Fall Precaution Comments: trach plugged for therapy Restrictions Weight Bearing Restrictions: No  Pain: Pain Assessment Pain Assessment: No/denies pain ADL: See Function Navigator for Current Functional Status.   Therapy/Group: Individual Therapy  Soley Harriss OTR/L 08/04/2015, 12:22 PM

## 2015-08-05 ENCOUNTER — Inpatient Hospital Stay (HOSPITAL_COMMUNITY): Payer: Self-pay | Admitting: Physical Therapy

## 2015-08-05 LAB — BASIC METABOLIC PANEL
ANION GAP: 8 (ref 5–15)
BUN: 31 mg/dL — ABNORMAL HIGH (ref 6–20)
CALCIUM: 10.1 mg/dL (ref 8.9–10.3)
CHLORIDE: 105 mmol/L (ref 101–111)
CO2: 22 mmol/L (ref 22–32)
CREATININE: 1.28 mg/dL — AB (ref 0.44–1.00)
GFR calc Af Amer: 56 mL/min — ABNORMAL LOW (ref >60)
GFR calc non Af Amer: 48 mL/min — ABNORMAL LOW (ref >60)
GLUCOSE: 91 mg/dL (ref 65–99)
POTASSIUM: 4 mmol/L (ref 3.5–5.1)
SODIUM: 135 mmol/L (ref 135–145)

## 2015-08-05 NOTE — Progress Notes (Signed)
Subjective/Complaints:  No issues overnite  Review of systems negative for shortness of breath, chest pain, nausea vomiting.   Objective: Vital Signs: Blood pressure 132/77, pulse 63, temperature 97.6 F (36.4 C), temperature source Oral, resp. rate 18, weight 106.1 kg (233 lb 14.5 oz), SpO2 96 %. No results found. Results for orders placed or performed during the hospital encounter of 07/24/15 (from the past 72 hour(s))  Basic metabolic panel     Status: Abnormal   Collection Time: 08/03/15  5:54 AM  Result Value Ref Range   Sodium 134 (L) 135 - 145 mmol/L   Potassium 4.5 3.5 - 5.1 mmol/L   Chloride 103 101 - 111 mmol/L   CO2 23 22 - 32 mmol/L   Glucose, Bld 96 65 - 99 mg/dL   BUN 42 (H) 6 - 20 mg/dL   Creatinine, Ser 1.85 (H) 0.44 - 1.00 mg/dL   Calcium 10.1 8.9 - 10.3 mg/dL   GFR calc non Af Amer 31 (L) >60 mL/min   GFR calc Af Amer 36 (L) >60 mL/min    Comment: (NOTE) The eGFR has been calculated using the CKD EPI equation. This calculation has not been validated in all clinical situations. eGFR's persistently <60 mL/min signify possible Chronic Kidney Disease.    Anion gap 8 5 - 15  Basic metabolic panel     Status: Abnormal   Collection Time: 08/04/15  5:40 AM  Result Value Ref Range   Sodium 131 (L) 135 - 145 mmol/L   Potassium 4.0 3.5 - 5.1 mmol/L   Chloride 101 101 - 111 mmol/L   CO2 22 22 - 32 mmol/L   Glucose, Bld 94 65 - 99 mg/dL   BUN 40 (H) 6 - 20 mg/dL   Creatinine, Ser 1.67 (H) 0.44 - 1.00 mg/dL   Calcium 9.9 8.9 - 10.3 mg/dL   GFR calc non Af Amer 35 (L) >60 mL/min   GFR calc Af Amer 41 (L) >60 mL/min    Comment: (NOTE) The eGFR has been calculated using the CKD EPI equation. This calculation has not been validated in all clinical situations. eGFR's persistently <60 mL/min signify possible Chronic Kidney Disease.    Anion gap 8 5 - 15  Basic metabolic panel     Status: Abnormal   Collection Time: 08/05/15  5:18 AM  Result Value Ref Range   Sodium 135 135 - 145 mmol/L   Potassium 4.0 3.5 - 5.1 mmol/L   Chloride 105 101 - 111 mmol/L   CO2 22 22 - 32 mmol/L   Glucose, Bld 91 65 - 99 mg/dL   BUN 31 (H) 6 - 20 mg/dL   Creatinine, Ser 1.28 (H) 0.44 - 1.00 mg/dL   Calcium 10.1 8.9 - 10.3 mg/dL   GFR calc non Af Amer 48 (L) >60 mL/min   GFR calc Af Amer 56 (L) >60 mL/min    Comment: (NOTE) The eGFR has been calculated using the CKD EPI equation. This calculation has not been validated in all clinical situations. eGFR's persistently <60 mL/min signify possible Chronic Kidney Disease.    Anion gap 8 5 - 15     HEENT: trach site with granulation tissue no erythema, epithelium not closed Cardio: RRR and no murmurs or extra sounds Resp: CTA B/L and unlabored GI: BS positive and non-distended nontender Extremity:  Pulses positive and No Edema Skin:   Other ttrach site CDI, IV site left arm CDI Neuro: Alert/Oriented, Cranial Nerve II-XII normal, Normal Sensory, Abnormal Motor  4- /5 bilateral deltoid, biceps, triceps, grip, hip flexor, knee extensor, ankle dorsiflexor and plantar flexor and Abnormal FMC Ataxic/ dec FMC Musc/Skel:  Normal Gen. no acute distress   Assessment/Plan: 1. 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 Physiatrist is providing close team supervision and 24 hour management of active medical problems listed below. Physiatrist and rehab team continue to assess barriers to discharge/monitor patient progress toward functional and medical goals. FIM: Function - Bathing Position: Shower Body parts bathed by patient: Left arm, Right arm, Chest, Abdomen, Right upper leg, Left upper leg, Front perineal area, Buttocks, Right lower leg, Left lower leg Body parts bathed by helper: Back Bathing not applicable: Front perineal area, Buttocks (Completed  earlier with nursing per pt) Assist Level: Touching or steadying assistance(Pt > 75%)  Function- Upper Body Dressing/Undressing What is the patient wearing?: Pull over shirt/dress Bra - Perfomed by patient: Hook/unhook bra (pull down sports bra), Thread/unthread left bra strap, Thread/unthread right bra strap (Mod assist needed for turning bra around after fastening it in the front) Bra - Perfomed by helper: Thread/unthread right bra strap, Thread/unthread left bra strap, Hook/unhook bra (pull down sports bra) Pull over shirt/dress - Perfomed by patient: Thread/unthread left sleeve, Put head through opening, Thread/unthread right sleeve, Pull shirt over trunk Pull over shirt/dress - Perfomed by helper: Thread/unthread right sleeve, Thread/unthread left sleeve, Put head through opening, Pull shirt over trunk Assist Level: Touching or steadying assistance(Pt > 75%) Function - Lower Body Dressing/Undressing What is the patient wearing?: Non-skid slipper socks, Pants, Socks Position: Wheelchair/chair at sink Pants- Performed by patient: Thread/unthread right pants leg, Thread/unthread left pants leg, Pull pants up/down Pants- Performed by helper: Thread/unthread right pants leg, Thread/unthread left pants leg, Pull pants up/down Non-skid slipper socks- Performed by patient: Don/doff right sock, Don/doff left sock Non-skid slipper socks- Performed by helper: Don/doff left sock Assist for footwear: Dependant Assist for lower body dressing: Touching or steadying assistance (Pt > 75%)  Function - Toileting Toileting activity did not occur: No continent bowel/bladder event Toileting steps completed by patient: Adjust clothing prior to toileting Toileting steps completed by helper: Adjust clothing prior to toileting, Adjust clothing after toileting, Performs perineal hygiene Toileting Assistive Devices: Grab bar or rail Assist level: Touching or steadying assistance (Pt.75%)  Function - Engineer, petroleum transfer assistive device: Grab bar Assist level to toilet: Touching or steadying assistance (Pt > 75%) Assist level from toilet: Maximal assist (Pt 25 - 49%/lift and lower)  Function - Chair/bed transfer Chair/bed transfer method: Stand pivot Chair/bed transfer assist level: Moderate assist (Pt 50 - 74%/lift or lower) Chair/bed transfer assistive device: Armrests Mechanical lift: Stedy Chair/bed transfer details: Manual facilitation for placement, Manual facilitation for weight bearing, Verbal cues for technique, Manual facilitation for weight shifting  Function - Locomotion: Wheelchair Will patient use wheelchair at discharge?: Yes Type: Manual Max wheelchair distance: 125 Assist Level: Supervision or verbal cues Assist Level: Supervision or verbal cues Wheel 150 feet activity did not occur: Safety/medical concerns Assist Level: Supervision or verbal cues Turns around,maneuvers to table,bed, and toilet,negotiates 3% grade,maneuvers on rugs and over doorsills: No Function - Locomotion: Ambulation Ambulation activity did not occur: Safety/medical concerns (unsafe to attempt due to weakness) Assistive device: Ethelene Hal Max distance: 40 Assist level: 2 helpers Assist level: 2 helpers Walk 50 feet with 2 turns activity did not occur: Safety/medical concerns Walk  150 feet activity did not occur: Safety/medical concerns Walk 10 feet on uneven surfaces activity did not occur: Safety/medical concerns  Function - Comprehension Comprehension: Auditory Comprehension assist level: Understands complex 90% of the time/cues 10% of the time  Function - Expression Expression: Verbal Expression assistive device: Talk trach valve Expression assist level: Expresses basic 75 - 89% of the time/requires cueing 10 - 24% of the time. Needs helper to occlude trach/needs to repeat words.  Function - Social Interaction Social Interaction assist level: Interacts appropriately 90% of  the time - Needs monitoring or encouragement for participation or interaction.  Function - Problem Solving Problem solving assist level: Solves basic 75 - 89% of the time/requires cueing 10 - 24% of the time  Function - Memory Memory assist level: Recognizes or recalls 75 - 89% of the time/requires cueing 10 - 24% of the time Patient normally able to recall (first 3 days only): Current season   Medical Problem List and Plan: 1.  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  DVT Prophylaxis/Anticoagulation: Pharmaceutical: Lovenox, given bump in creat will reduce to 74m 3. Pain Management:  Will try low dose ultram prn   4. Mood: team to provide ego support. LCSW to follow for evaluation.   5. Neuropsych: This patient is not capable of making decisions on her own behalf. 6. Skin/Wound Care:   Maintain adequate nutrition and hydration.   7. Fluids/Electrolytes/Nutrition: Monitor I/O on current diet.  Check lytes in am. 8. VDRF with trach: now decannulated 9. HTN: Continue norvasc, lisinopril, apresoline, metoprolol and HCTZ.  Monitor BP every 6 hours and titrate medications as indicated.  Filed Vitals:   08/05/15 0525 08/05/15 0549  BP: 122/67 132/77  Pulse: 63   Temp: 97.6 F (36.4 C)   Resp: 18    10 ARF- improving off bactrim and lisinopril, D/C IVF today BMP Latest Ref Rng 08/05/2015 08/04/2015 08/03/2015  Glucose 65 - 99 mg/dL 91 94 96  BUN 6 - 20 mg/dL 31(H) 40(H) 42(H)  Creatinine 0.44 - 1.00 mg/dL 1.28(H) 1.67(H) 1.85(H)  Sodium 135 - 145 mmol/L 135 131(L) 134(L)  Potassium 3.5 - 5.1 mmol/L 4.0 4.0 4.5  Chloride 101 - 111 mmol/L 105 101 103  CO2 22 - 32 mmol/L 22 22 23   Calcium 8.9 - 10.3 mg/dL 10.1 9.9 10.1    11. ABLA: Monitor for signs of bleeding.  Recheck CBC  In am  CBC Latest Ref Rng 07/25/2015 07/24/2015 07/23/2015  WBC 4.0 - 10.5 K/uL 9.0 8.7 7.5  Hemoglobin 12.0 - 15.0 g/dL 9.4(L) 9.4(L) 10.3(L)  Hematocrit 36.0 - 46.0 % 31.3(L) 31.1(L) 34.0(L)  Platelets 150 - 400 K/uL 412(H) 387 330      12. Dysphagia: Monitor tolerance of dysphagia 2,Upgraded to thin liquid yesterday 13. Abnormal LFT:  Discontinue tylenol.? Shock liver, Recheck CMET 14. Anxiety: Monitor on buspar bid.  15. Diarrhea: improved , daily BMs, no incont  16.  Hypoalbuminemia start Prostat LOS (Days) 12 A FACE TO FACE EVALUATION WAS PERFORMED  Bush Murdoch E 08/05/2015, 8:05 AM

## 2015-08-05 NOTE — Progress Notes (Signed)
Physical Therapy Session Note  Patient Details  Name: Gwendolyn SimonSherry Halberstam MRN: [id number] Date of Birth: 1965-08-05  Today's Date: 08/05/2015 PT Individual Time: 1100-1158 PT Individual Time Calculation (min): 58 min   Short Term Goals: Week 2:  PT Short Term Goal 1 (Week 2): pt will move supine>< sit with min assist PT Short Term Goal 2 (Week 2): pt will transfer with mod assist consistently PT Short Term Goal 3 (Week 2): pt will perform gait iwth LRAD and and assistance of 1 person, x 100' PT Short Term Goal 4 (Week 2): pt will ascend/descend 4 steps 2 rails with mod assist  Skilled Therapeutic Interventions/Progress Updates:    Pt received in w/c & agreeable to PT, denying c/o pain. Pt's family present during session. W/c propulsion x 150 ft room>gym with supervision A & using BUE. Gait training x 50 ft + 30 ft + 15 ft during session with Fara BorosEva Walker & Min A. Pt with intermittent scissoring gait and overall unsteadiness on feet. Pt required min/mod A for sit<>stand transfers as pt required maximum education/cuing to square up to sitting surface & reach back with BUE before transferring. Pt able to transfer supine<>sit on mat table with supervision A. While on mat table pt performed BLE straight leg raises & bridging. PT provided maximum multimodal cuing for breathing technique but pt still unable to demonstrate. At end of session pt propelled w/c x 150 ft back to room. Pt left in room with all needs within reach & family present.   Therapy Documentation Precautions:  Precautions Precautions: Fall Precaution Comments: trach plugged for therapy Restrictions Weight Bearing Restrictions: No  Pain: Pain Assessment Pain Assessment: No/denies pain   See Function Navigator for Current Functional Status.   Therapy/Group: Individual Therapy  Sandi MariscalVictoria M Miller 08/05/2015, 8:04 AM

## 2015-08-06 ENCOUNTER — Inpatient Hospital Stay (HOSPITAL_COMMUNITY): Payer: Self-pay | Admitting: Speech Pathology

## 2015-08-06 ENCOUNTER — Inpatient Hospital Stay (HOSPITAL_COMMUNITY): Payer: Self-pay | Admitting: Occupational Therapy

## 2015-08-06 ENCOUNTER — Inpatient Hospital Stay (HOSPITAL_COMMUNITY): Payer: Self-pay

## 2015-08-06 NOTE — Progress Notes (Signed)
Recreational Therapy Session Note  Patient Details  Name: Gwendolyn Morales MRN: [id number] Date of Birth: [birth year] Today's Date: 08/06/2015  Pain: no c/o Skilled Therapeutic Interventions/Progress Updates: Session focused on activity tolerance, w/c propulsion, standing tolerance, dynamic standing balance, side-stepping, kitchen safety, UE use, speech intelligeability, breath control during co-treat with PT.  Pt propelled w/c from room to gym using BUEs with max cues.  Pt stood at the counter to obtain items from shelving and then read out the directions.  Pt stood ~12 minutes before needed seated rest break.  Therapy/Group: Co-Treatment  Tao Satz 08/06/2015, 3:31 PM

## 2015-08-06 NOTE — Progress Notes (Signed)
Physical Therapy Note  Patient Details  Name: Gwendolyn SimonSherry Morales MRN: [id number] Date of Birth: 12-02-1965 Today's Date: 08/06/2015  1105-1130, 25 min individual 1255-145, 50 min individual  Pain: none reported AM or PM  Tx 1:  neuromuscular re-education via multi modal cues, forced use for: W/c propulsion using bil UEs with max cues for steering due to veering R continually; R/L hip abduction, side stepping at counter, cues for neutral hip alignment, x 12' R and L. With Therapeutic Rec therapist, pt stood in kitchen at counter, with 1 UE support as she manipulated and read packages for baking mixes, x 12 minutes.  Cues for breath support for better enunciation of ingredients.  Rec Therapist transported pt in w/c to [employer].  Tx2:  neuromuscular re-education via forced use, visual feedback, multimodal cues for: NuStep at level 4 x 8 minutes focusing on trunk rotation and neutral bil hip rotation; w/c propulsion using bil LEs for alternating reciprocal movements.  Gait iwht EVA walker x 90' with mod assist to stand, mod to steer RW.  Pt fatigued and c/o L knee (muscular) pain; ACE wrapped L knee. Gait x 40' with black bari RW iwht min assist.  Therapeutic activity for improving eccentric control during stand> sit from w/c, focusing on biomechanics.  Pt very fearful and typically does not transfer wt far enough anteriorly. Pt left resting in w/c with quick release belt applied and all needs within reach.  Shyvonne Chastang 08/06/2015, 7:57 AM

## 2015-08-06 NOTE — Progress Notes (Signed)
Subjective/Complaints:  Looks brighter, 2 BM yesterday  Review of systems negative for shortness of breath, chest pain, nausea vomiting.   Objective: Vital Signs: Blood pressure 127/78, pulse 71, temperature 97.7 F (36.5 C), temperature source Oral, resp. rate 18, weight 106.3 kg (234 lb 5.6 oz), SpO2 98 %. No results found. Results for orders placed or performed during the hospital encounter of 07/24/15 (from the past 72 hour(s))  Basic metabolic panel     Status: Abnormal   Collection Time: 08/04/15  5:40 AM  Result Value Ref Range   Sodium 131 (L) 135 - 145 mmol/L   Potassium 4.0 3.5 - 5.1 mmol/L   Chloride 101 101 - 111 mmol/L   CO2 22 22 - 32 mmol/L   Glucose, Bld 94 65 - 99 mg/dL   BUN 40 (H) 6 - 20 mg/dL   Creatinine, Ser 1.67 (H) 0.44 - 1.00 mg/dL   Calcium 9.9 8.9 - 10.3 mg/dL   GFR calc non Af Amer 35 (L) >60 mL/min   GFR calc Af Amer 41 (L) >60 mL/min    Comment: (NOTE) The eGFR has been calculated using the CKD EPI equation. This calculation has not been validated in all clinical situations. eGFR's persistently <60 mL/min signify possible Chronic Kidney Disease.    Anion gap 8 5 - 15  Basic metabolic panel     Status: Abnormal   Collection Time: 08/05/15  5:18 AM  Result Value Ref Range   Sodium 135 135 - 145 mmol/L   Potassium 4.0 3.5 - 5.1 mmol/L   Chloride 105 101 - 111 mmol/L   CO2 22 22 - 32 mmol/L   Glucose, Bld 91 65 - 99 mg/dL   BUN 31 (H) 6 - 20 mg/dL   Creatinine, Ser 1.28 (H) 0.44 - 1.00 mg/dL   Calcium 10.1 8.9 - 10.3 mg/dL   GFR calc non Af Amer 48 (L) >60 mL/min   GFR calc Af Amer 56 (L) >60 mL/min    Comment: (NOTE) The eGFR has been calculated using the CKD EPI equation. This calculation has not been validated in all clinical situations. eGFR's persistently <60 mL/min signify possible Chronic Kidney Disease.    Anion gap 8 5 - 15     HEENT: trach site with granulation tissue no erythema, epithelium not closed Cardio: RRR and no  murmurs or extra sounds Resp: CTA B/L and unlabored GI: BS positive and non-distended nontender Extremity:  Pulses positive and No Edema Skin:   Other ttrach site CDI, IV site left arm CDI Neuro: Alert/Oriented, Cranial Nerve II-XII normal, Normal Sensory, Abnormal Motor 4- /5 bilateral deltoid, biceps, triceps, grip, hip flexor, knee extensor, ankle dorsiflexor and plantar flexor and Abnormal FMC Ataxic/ dec FMC Musc/Skel:  Normal Gen. no acute distress   Assessment/Plan: 1. 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 Physiatrist is providing close team supervision and 24 hour management of active medical problems listed below. Physiatrist and rehab team continue to assess barriers to discharge/monitor patient progress toward functional and medical goals. FIM: Function - Bathing Position: Shower Body parts bathed by patient: Left arm, Right arm, Chest, Abdomen, Right upper leg, Left upper leg, Front perineal area, Buttocks, Right lower leg, Left lower leg Body parts bathed by helper: Back Bathing not applicable: Front perineal area, Buttocks (Completed earlier with nursing per pt) Assist Level: Touching or steadying  assistance(Pt > 75%)  Function- Upper Body Dressing/Undressing What is the patient wearing?: Pull over shirt/dress Bra - Perfomed by patient: Hook/unhook bra (pull down sports bra), Thread/unthread left bra strap, Thread/unthread right bra strap (Mod assist needed for turning bra around after fastening it in the front) Bra - Perfomed by helper: Thread/unthread right bra strap, Thread/unthread left bra strap, Hook/unhook bra (pull down sports bra) Pull over shirt/dress - Perfomed by patient: Thread/unthread left sleeve, Put head through opening, Thread/unthread right sleeve, Pull shirt over trunk Pull over  shirt/dress - Perfomed by helper: Thread/unthread right sleeve, Thread/unthread left sleeve, Put head through opening, Pull shirt over trunk Assist Level: Touching or steadying assistance(Pt > 75%) Function - Lower Body Dressing/Undressing What is the patient wearing?: Non-skid slipper socks, Pants, Socks Position: Wheelchair/chair at sink Pants- Performed by patient: Thread/unthread right pants leg, Thread/unthread left pants leg, Pull pants up/down Pants- Performed by helper: Thread/unthread right pants leg, Thread/unthread left pants leg, Pull pants up/down Non-skid slipper socks- Performed by patient: Don/doff right sock, Don/doff left sock Non-skid slipper socks- Performed by helper: Don/doff left sock Assist for footwear: Dependant Assist for lower body dressing: Touching or steadying assistance (Pt > 75%)  Function - Toileting Toileting activity did not occur: No continent bowel/bladder event Toileting steps completed by patient: Adjust clothing prior to toileting, Performs perineal hygiene Toileting steps completed by helper: Adjust clothing after toileting Toileting Assistive Devices: Grab bar or rail Assist level: Touching or steadying assistance (Pt.75%)  Function - Air cabin crew transfer assistive device: Grab bar Assist level to toilet: Touching or steadying assistance (Pt > 75%) Assist level from toilet: Maximal assist (Pt 25 - 49%/lift and lower)  Function - Chair/bed transfer Chair/bed transfer method: Stand pivot Chair/bed transfer assist level: Moderate assist (Pt 50 - 74%/lift or lower) Chair/bed transfer assistive device: Armrests Mechanical lift: Stedy Chair/bed transfer details: Manual facilitation for placement, Manual facilitation for weight bearing, Verbal cues for technique, Manual facilitation for weight shifting  Function - Locomotion: Wheelchair Will patient use wheelchair at discharge?: Yes Type: Manual Max wheelchair distance: 150 ft Assist  Level: Supervision or verbal cues Assist Level: Supervision or verbal cues Wheel 150 feet activity did not occur: Safety/medical concerns Assist Level: Supervision or verbal cues Turns around,maneuvers to table,bed, and toilet,negotiates 3% grade,maneuvers on rugs and over doorsills: No Function - Locomotion: Ambulation Ambulation activity did not occur: Safety/medical concerns (unsafe to attempt due to weakness) Assistive device: Ethelene Hal Max distance: 50 ft Assist level: Touching or steadying assistance (Pt > 75%) Assist level: Touching or steadying assistance (Pt > 75%) Walk 50 feet with 2 turns activity did not occur: Safety/medical concerns Assist level: Touching or steadying assistance (Pt > 75%) Walk 150 feet activity did not occur: Safety/medical concerns Walk 10 feet on uneven surfaces activity did not occur: Safety/medical concerns  Function - Comprehension Comprehension: Auditory Comprehension assist level: Understands complex 90% of the time/cues 10% of the time  Function - Expression Expression: Verbal Expression assistive device: Talk trach valve Expression assist level: Expresses basic 75 - 89% of the time/requires cueing 10 - 24% of the time. Needs helper to occlude trach/needs to repeat words.  Function - Social Interaction Social Interaction assist level: Interacts appropriately 90% of the time - Needs monitoring or encouragement for participation or interaction.  Function - Problem Solving Problem solving assist level: Solves basic 75 - 89% of the time/requires cueing 10 - 24% of the time  Function - Memory Memory assist level: Recognizes or recalls 75 -  89% of the time/requires cueing 10 - 24% of the time Patient normally able to recall (first 3 days only): Current season   Medical Problem List and Plan: 1.  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  DVT Prophylaxis/Anticoagulation: Pharmaceutical: Lovenox, given bump in creat will reduce to 5m 3. Pain Management:  Will try low dose ultram prn   4. Mood: team to provide ego support. LCSW to follow for evaluation.   5. Neuropsych: This patient is not capable of making decisions on her own behalf. 6. Skin/Wound Care:   Maintain adequate nutrition and hydration.   7. Fluids/Electrolytes/Nutrition: Monitor I/O on current diet.  Check lytes in am. 8. VDRF with trach: now decannulated 9. HTN: Continue norvasc, lisinopril, apresoline, metoprolol and HCTZ.  Monitor BP every 6 hours and titrate medications as indicated.  Filed Vitals:   08/05/15 2147 08/06/15 0614  BP: 141/83 127/78  Pulse: 74 71  Temp:  97.7 F (36.5 C)  Resp:  18   10 ARF- improving off bactrim and lisinopril, D/C IV heplock  BMP Latest Ref Rng 08/05/2015 08/04/2015 08/03/2015  Glucose 65 - 99 mg/dL 91 94 96  BUN 6 - 20 mg/dL 31(H) 40(H) 42(H)  Creatinine 0.44 - 1.00 mg/dL 1.28(H) 1.67(H) 1.85(H)  Sodium 135 - 145 mmol/L 135 131(L) 134(L)  Potassium 3.5 - 5.1 mmol/L 4.0 4.0 4.5  Chloride 101 - 111 mmol/L 105 101 103  CO2 22 - 32 mmol/L 22 22 23   Calcium 8.9 - 10.3 mg/dL 10.1 9.9 10.1    11. ABLA: Monitor for signs of bleeding. Recheck CBC  In am  CBC Latest Ref Rng 07/25/2015 07/24/2015 07/23/2015  WBC 4.0 - 10.5 K/uL 9.0 8.7 7.5  Hemoglobin 12.0 - 15.0 g/dL 9.4(L) 9.4(L) 10.3(L)  Hematocrit 36.0 - 46.0 % 31.3(L) 31.1(L) 34.0(L)  Platelets 150 - 400 K/uL 412(H) 387 330      12. Dysphagia: Monitor tolerance of dysphagia 2,thins, 75% meals 13. Abnormal LFT:  Discontinue tylenol.? Shock liver, CMET in am 14. Anxiety: Monitor on buspar bid.  15. Diarrhea: improved , daily BMs, no incont  16.  Hypoalbuminemia start Prostat LOS (Days) 13 A FACE TO FACE EVALUATION WAS  PERFORMED  Gwendolyn Morales E 08/06/2015, 7:30 AM

## 2015-08-06 NOTE — Progress Notes (Signed)
Speech Language Pathology Daily Session Note  Patient Details  Name: Gwendolyn Morales MRN: [id number] Date of Birth: 1965-08-30  Today's Date: 08/06/2015 SLP Individual Time: 0830-0900 SLP Individual Time Calculation (min): 30 min  Short Term Goals: Week 2: SLP Short Term Goal 1 (Week 2): Pt will consume therapeutic trials of thin liquids with supervision cues for use of swallowing precautions and minimal overt s/s of aspiration  SLP Short Term Goal 2 (Week 2): Pt will be intelligible at the sentence level in 75% of opportunities with supervision cues for pacing, overarticulation, and increased vocal intensity.  SLP Short Term Goal 3 (Week 2): Pt will sustain her attention to basic, tasks for 20 minutes with mod I redirection to task  SLP Short Term Goal 4 (Week 2): Pt will recall basic, daily information with supervision assist verbal cues for use of external aids.   SLP Short Term Goal 5 (Week 2): Pt will monitor and correct verbal errors in 75% of opportunities with supervision verbal cues.   Skilled Therapeutic Interventions: skilled treatment session focused on cognition goals. Pt able to sustain her attention to basic tasks for 30 minutes without assistance. SLP facilitated session by providing Mod I for monitoring and correcting verbal errors in 90% opportunities. She was intelligible at the sentence level with 90% given supervision cues for over-articulation and increasing vocal intensity when ordering meal with dietary staff. Pt independently engaged in a descriptive barrier tasks with multiple sentences and 75% intelligibility. Pt able to recall basic daily information with supervision assist. Pt was left in recliner with safety belt in place. All needs left within reach. Continue current plan of care.   Function:   Cognition Comprehension Comprehension assist level: Understands complex 90% of the time/cues 10% of the time  Expression Expression assistive device: Talk trach  valve Expression assist level: Expresses basic 75 - 89% of the time/requires cueing 10 - 24% of the time. Needs helper to occlude trach/needs to repeat words.  Social Interaction Social Interaction assist level: Interacts appropriately 90% of the time - Needs monitoring or encouragement for participation or interaction.  Problem Solving Problem solving assist level: Solves basic 75 - 89% of the time/requires cueing 10 - 24% of the time  Memory Memory assist level: Recognizes or recalls 75 - 89% of the time/requires cueing 10 - 24% of the time    Pain Pain Assessment Pain Assessment: No/denies pain  Therapy/Group: Individual Therapy  Alva Broxson 08/06/2015, 12:09 PM

## 2015-08-06 NOTE — Progress Notes (Signed)
Occupational Therapy Session Note  Patient Details  Name: Gwendolyn Morales MRN: [id number] Date of Birth: Jun 29, 1965  Today's Date: 08/06/2015 OT Individual Time: 0920-1005 OT Individual Time Calculation (min): 45 min   Short Term Goals: Week 1:  OT Short Term Goal 1 (Week 1): Pt will maintain static/dynamic sittting EOC during UB dressing with supervision. OT Short Term Goal 1 - Progress (Week 1): Met OT Short Term Goal 2 (Week 1): Pt will complete LB bathing sit to stand with max assist of 1. OT Short Term Goal 2 - Progress (Week 1): Met OT Short Term Goal 3 (Week 1): Pt will complete UB dressing in unsupported sitting with supervision. OT Short Term Goal 3 - Progress (Week 1): Met OT Short Term Goal 4 (Week 1): Pt will transfer from supine to sit EOB in preparation for selfcare tasks with supervision.  OT Short Term Goal 4 - Progress (Week 1): Not met OT Short Term Goal 5 (Week 1): Pt will perform squat pivot transfer to drop arm commode with max assist.  OT Short Term Goal 5 - Progress (Week 1): Met   Week 2:  OT Short Term Goal 1 (Week 2): Pt will transfer from supine to sit EOB in preparation for selfcare tasks with supervision.  OT Short Term Goal 2 (Week 2): Pt will complete LB bathing sit to stand with mod assist 2 consecutive sessions. OT Short Term Goal 3 (Week 2): Pt will transfer stand pivot to the 3:1 with mod assist.   OT Short Term Goal 4 (Week 2): Pt will complete tub/shower transfers with mod assist stand pivot with RW.  Skilled Therapeutic Interventions/Progress Updates:  Patient found seated in recliner with no complaints of pain. Pt eager to show this therapist her handwriting homework and therapist encouraged pt to continue working on this task, making sure her letters stay in the lines. Pt transferred recliner to w/c with min assist (scoot pivot), moving arm rests out of the way. Pt propelled self from room to therapy gym. Pt remained in w/c and performed fine motor  activity of threading and unthreading beads on a string, minimal spillage - 20 beads with 1 spillage. Pt reported that she normally does something similar to this at home for gifts. Encouraged pt to have husband bring in equipment and work on this in between therapy sessions. Pt then performed w/c pushups - 3 sets of 10; taking increased time and needing minimal breaks. Therapist assisted pt back to room and left pt seated in w/c with quick release belt donned and all needs within reach. Pt did comment that she felt "off" this morning.  Therapy Documentation Precautions:  Precautions Precautions: Fall Precaution Comments: trach plugged for therapy Restrictions Weight Bearing Restrictions: No  Vital Signs: Therapy Vitals Temp: 97.7 F (36.5 C) Temp Source: Oral Pulse Rate: 71 Resp: 18 BP: 127/78 mmHg Patient Position (if appropriate): Lying Oxygen Therapy SpO2: 98 % O2 Device: Not Delivered  See Function Navigator for Current Functional Status.  Therapy/Group: Individual Therapy  [name] , MS, OTR/L, CLT  08/06/2015, 10:11 AM

## 2015-08-06 NOTE — Progress Notes (Signed)
Speech Language Pathology Daily Session Note  Patient Details  Name: Gwendolyn SimonSherry Morales MRN: [id number] Date of Birth: [birth year]  Today's Date: 08/06/2015 SLP Group Time: 1130-1230 SLP Group Time Calculation (min): 60 min  Short Term Goals: Week 2: SLP Short Term Goal 1 (Week 2): Pt will consume therapeutic trials of thin liquids with supervision cues for use of swallowing precautions and minimal overt s/s of aspiration  SLP Short Term Goal 2 (Week 2): Pt will be intelligible at the sentence level in 75% of opportunities with supervision cues for pacing, overarticulation, and increased vocal intensity.  SLP Short Term Goal 3 (Week 2): Pt will sustain her attention to basic, tasks for 20 minutes with mod I redirection to task  SLP Short Term Goal 4 (Week 2): Pt will recall basic, daily information with supervision assist verbal cues for use of external aids.   SLP Short Term Goal 5 (Week 2): Pt will monitor and correct verbal errors in 75% of opportunities with supervision verbal cues.   Skilled Therapeutic Interventions:  Pt was seen for skilled ST targeting goals for dysphagia and communication.  Pt consumed dys 2 textures and thin liquids with supervision verbal cues for use of chin tuck.  Pt also consumed trials of dys 3 textures without difficulty and no residual solids remaining in the oral cavity post swallow.  No overt s/s of aspiration were evident with solids or liquids.  Pt was intelligible at the sentence level in a moderately noisy and distracting environment with supervision verbal cues for increased vocal intensity, overarticulation, and slow rate.  Pt's intelligibility did not suffer when pt was engaged in card game/multitasking.  Pt recalled 3 out of 4 speech intelligibility strategies targeted during previous therapy sessions with min assist question cues.  Pt was returned to room at the end of today's therapy session and left in wheelchair with quick release belt donned and call bell  within reach.  Continue per current plan of care.    Function:  Eating Eating   Modified Consistency Diet: Yes Eating Assist Level: Supervision or verbal cues           Cognition Comprehension Comprehension assist level: Follows basic conversation/direction with no assist  Expression Expression assistive device: Talk trach valve Expression assist level: Expresses basic 75 - 89% of the time/requires cueing 10 - 24% of the time. Needs helper to occlude trach/needs to repeat words.  Social Interaction Social Interaction assist level: Interacts appropriately 90% of the time - Needs monitoring or encouragement for participation or interaction.  Problem Solving Problem solving assist level: Solves basic 75 - 89% of the time/requires cueing 10 - 24% of the time  Memory Memory assist level: Recognizes or recalls 75 - 89% of the time/requires cueing 10 - 24% of the time    Pain Pain Assessment Pain Assessment: No/denies pain  Therapy/Group: Group Therapy  Eaven Schwager, Melanee SpryNicole L 08/06/2015, 1:27 PM

## 2015-08-07 ENCOUNTER — Inpatient Hospital Stay (HOSPITAL_COMMUNITY): Payer: Self-pay | Admitting: Occupational Therapy

## 2015-08-07 ENCOUNTER — Inpatient Hospital Stay (HOSPITAL_COMMUNITY): Payer: Self-pay | Admitting: Speech Pathology

## 2015-08-07 ENCOUNTER — Inpatient Hospital Stay (HOSPITAL_COMMUNITY): Payer: Self-pay | Admitting: *Deleted

## 2015-08-07 LAB — COMPREHENSIVE METABOLIC PANEL
ALT: 38 U/L (ref 14–54)
ANION GAP: 11 (ref 5–15)
AST: 22 U/L (ref 15–41)
Albumin: 3.3 g/dL — ABNORMAL LOW (ref 3.5–5.0)
Alkaline Phosphatase: 134 U/L — ABNORMAL HIGH (ref 38–126)
BILIRUBIN TOTAL: 0.9 mg/dL (ref 0.3–1.2)
BUN: 21 mg/dL — ABNORMAL HIGH (ref 6–20)
CHLORIDE: 103 mmol/L (ref 101–111)
CO2: 23 mmol/L (ref 22–32)
Calcium: 10.2 mg/dL (ref 8.9–10.3)
Creatinine, Ser: 1.45 mg/dL — ABNORMAL HIGH (ref 0.44–1.00)
GFR, EST AFRICAN AMERICAN: 48 mL/min — AB (ref >60)
GFR, EST NON AFRICAN AMERICAN: 42 mL/min — AB (ref >60)
Glucose, Bld: 89 mg/dL (ref 65–99)
POTASSIUM: 3.7 mmol/L (ref 3.5–5.1)
Sodium: 137 mmol/L (ref 135–145)
TOTAL PROTEIN: 6.6 g/dL (ref 6.5–8.1)

## 2015-08-07 MED ORDER — HYDROCHLOROTHIAZIDE 12.5 MG PO CAPS
12.5000 mg | ORAL_CAPSULE | Freq: Every day | ORAL | Status: DC
  Administered 2015-08-08 – 2015-08-17 (×10): 12.5 mg via ORAL
  Filled 2015-08-07 (×10): qty 1

## 2015-08-07 NOTE — Progress Notes (Signed)
Speech Language Pathology Daily Session Note  Patient Details  Name: Jerre SimonSherry Prabhakar MRN: [id number] Date of Birth: Nov 01, 1965  Today's Date: 08/07/2015 SLP Individual Time: 1101-1201 SLP Individual Time Calculation (min): 60 min  Short Term Goals: Week 2: SLP Short Term Goal 1 (Week 2): Pt will consume therapeutic trials of thin liquids with supervision cues for use of swallowing precautions and minimal overt s/s of aspiration  SLP Short Term Goal 2 (Week 2): Pt will be intelligible at the sentence level in 75% of opportunities with supervision cues for pacing, overarticulation, and increased vocal intensity.  SLP Short Term Goal 3 (Week 2): Pt will sustain her attention to basic, tasks for 20 minutes with mod I redirection to task  SLP Short Term Goal 4 (Week 2): Pt will recall basic, daily information with supervision assist verbal cues for use of external aids.   SLP Short Term Goal 5 (Week 2): Pt will monitor and correct verbal errors in 75% of opportunities with supervision verbal cues.   Skilled Therapeutic Interventions:  Pt was seen for skilled ST targeting goals for cognition and dysphagia.  SLP facilitated the session with a trial meal tray of dys 3 textures to continue working towards texture progression.  Pt consumed advanced solids with minimal left sided buccal residue which cleared with extra swallows and pt's self initiated use of liquid wash.  No overt s/s of aspiration were evident with solids or liquids.  Recommend diet advancement to dys 3 textures at this time with continued full supervision for use of swallowing precautions.  Therapist also facilitated the session with a novel card game targeting use of memory compensatory strategies (specifically association).  Pt was able to generate word-picture associations with mod I in 4 out of 4 opportunities.  Pt then generated specific category members for each associated word with supervision question cues for recall of previously  named items.  Pt was returned to room at the end of today's therapy session and left with call bell within reach and quick release belt donned.  Continue per current plan of care.    Function:  Eating Eating   Modified Consistency Diet: Yes Eating Assist Level: Supervision or verbal cues           Cognition Comprehension Comprehension assist level: Follows basic conversation/direction with no assist  Expression   Expression assist level: Expresses basic 75 - 89% of the time/requires cueing 10 - 24% of the time. Needs helper to occlude trach/needs to repeat words.  Social Interaction Social Interaction assist level: Interacts appropriately 90% of the time - Needs monitoring or encouragement for participation or interaction.  Problem Solving Problem solving assist level: Solves basic 75 - 89% of the time/requires cueing 10 - 24% of the time  Memory Memory assist level: Recognizes or recalls 75 - 89% of the time/requires cueing 10 - 24% of the time    Pain Pain Assessment Pain Assessment: No/denies pain  Therapy/Group: Individual Therapy  Yaseen Gilberg, Melanee SpryNicole L 08/07/2015, 4:04 PM

## 2015-08-07 NOTE — Progress Notes (Signed)
Physical Therapy Weekly Progress Note  Patient Details  Name: Gwendolyn Morales MRN: [id number] Date of Birth: [birth year]  Beginning of progress report period: August 01, 2015 End of progress report period: August 07, 2015   Patient has met 1 of 4 short term goals.  Pt continues to be limited by decreased activity tolerance and decreased strength limiting gait distance and requiring increased assistance with mobility. Pt is making steady progress and therefore will continue to benefit from skilled PT intervention to enhance overall performance with activity tolerance, balance and ability to compensate for deficits.  Patient progressing toward long term goals..  Continue plan of care.  PT Short Term Goals Week 2:  PT Short Term Goal 1 (Week 2): pt will move supine>< sit with min assist PT Short Term Goal 1 - Progress (Week 2): Progressing toward goal PT Short Term Goal 2 (Week 2): pt will transfer with mod assist consistently PT Short Term Goal 2 - Progress (Week 2): Met PT Short Term Goal 3 (Week 2): pt will perform gait iwth LRAD and and assistance of 1 person, x 100' PT Short Term Goal 3 - Progress (Week 2): Progressing toward goal PT Short Term Goal 4 (Week 2): pt will ascend/descend 4 steps 2 rails with mod assist PT Short Term Goal 4 - Progress (Week 2): Progressing toward goal Week 3:  PT Short Term Goal 1 (Week 3): pt will gait in controlled environment 100' with min A PT Short Term Goal 2 (Week 3): Pt will perform bed mobility with min A PT Short Term Goal 3 (Week 3): Pt will negotiate 4 steps min A with 2 handrails  Skilled Therapeutic Interventions/Progress Updates:  Ambulation/gait training;Balance/vestibular training;Cognitive remediation/compensation;Community reintegration;Discharge planning;Neuromuscular re-education;Functional mobility training;Functional electrical stimulation;DME/adaptive equipment instruction;Patient/family education;Pain management;Psychosocial  support;Splinting/orthotics;UE/LE Coordination activities;UE/LE Strength taining/ROM;Therapeutic Exercise;Therapeutic Activities;Stair training;Wheelchair propulsion/positioning    See Function Navigator for Current Functional Status.  Jayah Balthazar 08/07/2015, 3:03 PM

## 2015-08-07 NOTE — Progress Notes (Signed)
Recreational Therapy Session Note  Patient Details  Name: Gwendolyn SimonSherry Morales MRN: [id number] Date of Birth: 11-10-1965 Today's Date: 08/07/2015  Pain: no c/o Skilled Therapeutic Interventions/Progress Updates: Session focused on activity tolerance, dynanmic standing balance, side stepping, kitchen safety, speech intelligeability and breath support, ambulation using RW.  Pt performed sit-stands with min assist and contact guard assist for balance during simple meal prep activity making "no bake cheesecake"  Pt smiling and talkative throughout session stating how much she enjoyed being in the kitchen again.  Pt required min quesitoning cues for speech intelligeability.  Therapy/Group: Co-Treatment Levonia Wolfley 08/07/2015, 3:08 PM

## 2015-08-07 NOTE — Progress Notes (Signed)
Physical Therapy Note  Patient Details  Name: Jerre SimonSherry Swindell MRN: [id number] Date of Birth: 1965/07/03 Today's Date: 08/07/2015    Time: 1400-1500 60 minutes  1:1 No c/o pain. Pt performed sliding board transfer recliner to w/c with assist for set up, supervision for transfer.  Co-tx with TR in kitchen for standing cooking task. Pt able to stand 2 x 15 minutes while using UEs for cooking a cheesecake. Pt able to side step in the kitchen with supervision, requires min cuing to take breaks when fatigued.  Gait with RW 35', 15' with close supervision.  Sit to stand multiple attempts with min A, cues for forward wt shift to come to full standing. Pt pleased with progress and encouraged to continue working with rehab.   DONAWERTH,KAREN 08/07/2015, 3:05 PM

## 2015-08-07 NOTE — Progress Notes (Signed)
Occupational Therapy Session Note  Patient Details  Name: Gwendolyn Morales MRN: [id number] Date of Birth: Aug 21, 1965  Today's Date: 08/07/2015 OT Individual Time: 8329-1916 OT Individual Time Calculation (min): 60 min   Short Term Goals: Week 1:  OT Short Term Goal 1 (Week 1): Pt will maintain static/dynamic sittting EOC during UB dressing with supervision. OT Short Term Goal 1 - Progress (Week 1): Met OT Short Term Goal 2 (Week 1): Pt will complete LB bathing sit to stand with max assist of 1. OT Short Term Goal 2 - Progress (Week 1): Met OT Short Term Goal 3 (Week 1): Pt will complete UB dressing in unsupported sitting with supervision. OT Short Term Goal 3 - Progress (Week 1): Met OT Short Term Goal 4 (Week 1): Pt will transfer from supine to sit EOB in preparation for selfcare tasks with supervision.  OT Short Term Goal 4 - Progress (Week 1): Not met OT Short Term Goal 5 (Week 1): Pt will perform squat pivot transfer to drop arm commode with max assist.  OT Short Term Goal 5 - Progress (Week 1): Met   Week 2:  OT Short Term Goal 1 (Week 2): Pt will transfer from supine to sit EOB in preparation for selfcare tasks with supervision.  OT Short Term Goal 2 (Week 2): Pt will complete LB bathing sit to stand with mod assist 2 consecutive sessions. OT Short Term Goal 3 (Week 2): Pt will transfer stand pivot to the 3:1 with mod assist.   OT Short Term Goal 4 (Week 2): Pt will complete tub/shower transfers with mod assist stand pivot with RW.  Skilled Therapeutic Interventions/Progress Updates:  Skilled intervention focusing on functional mobility/transfers, dynamic sitting balance, ADL retraining, and overall activity tolerance/endurance. Patient found seated EOB with RN present administering medications. Pt stood using STEDY and therapist assisted pt into BR for shower stall transfer onto tub transfer bench. In seated position, pt performed UB/LB bathing, including washing of hair. Pt then  transferred out of shower with STEDY and performed dressing in front of sink/mirror in sit to/from stand position using STEDY; pt donning dress. Pt performed grooming tasks of washing face, combing hair, and brushing teeth standing in STEDY. Pt then sat in w/c for LB ADL of donning lotion and donning bilateral socks. Pt eager to have husband bring in her own personal items, therefore therapist administered plain piece of paper, drew lines on it, and encouraged pt to make a list on this paper.  At end of session, left pt seated in w/c with all needs within reach and quick release belt donned.   Therapy Documentation Precautions:  Precautions Precautions: Fall Precaution Comments: trach plugged for therapy Restrictions Weight Bearing Restrictions: No  Vital Signs: Therapy Vitals Temp: 97.5 F (36.4 C) Temp Source: Oral Pulse Rate: 76 Resp: 16 BP: 136/76 mmHg Patient Position (if appropriate): Lying Oxygen Therapy SpO2: 97 % O2 Device: Not Delivered  See Function Navigator for Current Functional Status.  Therapy/Group: Individual Therapy  [name] , MS, OTR/L, CLT  08/07/2015, 9:13 AM

## 2015-08-07 NOTE — Progress Notes (Signed)
Subjective/Complaints:  Drinking and eating well  Review of systems negative for shortness of breath, chest pain, nausea vomiting.   Objective: Vital Signs: Blood pressure 136/76, pulse 76, temperature 97.5 F (36.4 C), temperature source Oral, resp. rate 16, weight 104 kg (229 lb 4.5 oz), SpO2 97 %. No results found. Results for orders placed or performed during the hospital encounter of 07/24/15 (from the past 72 hour(s))  Basic metabolic panel     Status: Abnormal   Collection Time: 08/05/15  5:18 AM  Result Value Ref Range   Sodium 135 135 - 145 mmol/L   Potassium 4.0 3.5 - 5.1 mmol/L   Chloride 105 101 - 111 mmol/L   CO2 22 22 - 32 mmol/L   Glucose, Bld 91 65 - 99 mg/dL   BUN 31 (H) 6 - 20 mg/dL   Creatinine, Ser 1.28 (H) 0.44 - 1.00 mg/dL   Calcium 10.1 8.9 - 10.3 mg/dL   GFR calc non Af Amer 48 (L) >60 mL/min   GFR calc Af Amer 56 (L) >60 mL/min    Comment: (NOTE) The eGFR has been calculated using the CKD EPI equation. This calculation has not been validated in all clinical situations. eGFR's persistently <60 mL/min signify possible Chronic Kidney Disease.    Anion gap 8 5 - 15  Comprehensive metabolic panel     Status: Abnormal   Collection Time: 08/07/15  5:21 AM  Result Value Ref Range   Sodium 137 135 - 145 mmol/L   Potassium 3.7 3.5 - 5.1 mmol/L   Chloride 103 101 - 111 mmol/L   CO2 23 22 - 32 mmol/L   Glucose, Bld 89 65 - 99 mg/dL   BUN 21 (H) 6 - 20 mg/dL   Creatinine, Ser 1.45 (H) 0.44 - 1.00 mg/dL   Calcium 10.2 8.9 - 10.3 mg/dL   Total Protein 6.6 6.5 - 8.1 g/dL   Albumin 3.3 (L) 3.5 - 5.0 g/dL   AST 22 15 - 41 U/L   ALT 38 14 - 54 U/L   Alkaline Phosphatase 134 (H) 38 - 126 U/L   Total Bilirubin 0.9 0.3 - 1.2 mg/dL   GFR calc non Af Amer 42 (L) >60 mL/min   GFR calc Af Amer 48 (L) >60 mL/min    Comment: (NOTE) The eGFR has been calculated using the CKD EPI equation. This calculation has not been validated in all clinical situations. eGFR's  persistently <60 mL/min signify possible Chronic Kidney Disease.    Anion gap 11 5 - 15     HEENT: trach site with granulation tissue no erythema, epithelium not closed Cardio: RRR and no murmurs or extra sounds Resp: CTA B/L and unlabored GI: BS positive and non-distended nontender Extremity:  Pulses positive and No Edema Skin:   Other ttrach site CDI, IV site left arm CDI Neuro: Alert/Oriented, Cranial Nerve II-XII normal, Normal Sensory, Abnormal Motor 4- /5 bilateral deltoid, biceps, triceps, grip, hip flexor, knee extensor, ankle dorsiflexor and plantar flexor and Abnormal FMC Ataxic/ dec FMC Musc/Skel:  Normal Gen. no acute distress   Assessment/Plan: 1. 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 Physiatrist is providing close team supervision and 24 hour management of active medical problems listed below. Physiatrist and rehab team continue to assess barriers to discharge/monitor patient progress toward functional and medical goals. FIM: Function - Bathing Position: Shower  Body parts bathed by patient: Left arm, Right arm, Chest, Abdomen, Right upper leg, Left upper leg, Front perineal area, Buttocks, Right lower leg, Left lower leg Body parts bathed by helper: Back Bathing not applicable: Front perineal area, Buttocks (Completed earlier with nursing per pt) Assist Level: Touching or steadying assistance(Pt > 75%)  Function- Upper Body Dressing/Undressing What is the patient wearing?: Pull over shirt/dress Bra - Perfomed by patient: Hook/unhook bra (pull down sports bra), Thread/unthread left bra strap, Thread/unthread right bra strap (Mod assist needed for turning bra around after fastening it in the front) Bra - Perfomed by helper: Thread/unthread right bra strap, Thread/unthread left bra strap, Hook/unhook  bra (pull down sports bra) Pull over shirt/dress - Perfomed by patient: Thread/unthread left sleeve, Put head through opening, Thread/unthread right sleeve, Pull shirt over trunk Pull over shirt/dress - Perfomed by helper: Thread/unthread right sleeve, Thread/unthread left sleeve, Put head through opening, Pull shirt over trunk Assist Level: Touching or steadying assistance(Pt > 75%) Function - Lower Body Dressing/Undressing What is the patient wearing?: Non-skid slipper socks, Pants, Socks Position: Wheelchair/chair at sink Pants- Performed by patient: Thread/unthread right pants leg, Thread/unthread left pants leg, Pull pants up/down Pants- Performed by helper: Thread/unthread right pants leg, Thread/unthread left pants leg, Pull pants up/down Non-skid slipper socks- Performed by patient: Don/doff right sock, Don/doff left sock Non-skid slipper socks- Performed by helper: Don/doff left sock Assist for footwear: Dependant Assist for lower body dressing: Touching or steadying assistance (Pt > 75%)  Function - Toileting Toileting activity did not occur: No continent bowel/bladder event Toileting steps completed by patient: Adjust clothing prior to toileting, Performs perineal hygiene Toileting steps completed by helper: Adjust clothing after toileting Toileting Assistive Devices: Grab bar or rail Assist level: Touching or steadying assistance (Pt.75%)  Function - Air cabin crew transfer assistive device: Grab bar Assist level to toilet: Touching or steadying assistance (Pt > 75%) Assist level from toilet: Maximal assist (Pt 25 - 49%/lift and lower)  Function - Chair/bed transfer Chair/bed transfer method: Stand pivot Chair/bed transfer assist level: Moderate assist (Pt 50 - 74%/lift or lower) Chair/bed transfer assistive device: Armrests Mechanical lift: Stedy Chair/bed transfer details: Manual facilitation for placement, Manual facilitation for weight bearing, Verbal cues for  technique, Manual facilitation for weight shifting  Function - Locomotion: Wheelchair Will patient use wheelchair at discharge?: Yes Type: Manual Max wheelchair distance: 100 Assist Level: Supervision or verbal cues Assist Level: Supervision or verbal cues Wheel 150 feet activity did not occur: Safety/medical concerns Assist Level: Supervision or verbal cues Turns around,maneuvers to table,bed, and toilet,negotiates 3% grade,maneuvers on rugs and over doorsills: No Function - Locomotion: Ambulation Ambulation activity did not occur: Safety/medical concerns (unsafe to attempt due to weakness) Assistive device: Ethelene Hal Max distance: 90 Assist level: Moderate assist (Pt 50 - 74%) Assist level: Moderate assist (Pt 50 - 74%) Walk 50 feet with 2 turns activity did not occur: Safety/medical concerns Assist level: Touching or steadying assistance (Pt > 75%) Walk 150 feet activity did not occur: Safety/medical concerns Walk 10 feet on uneven surfaces activity did not occur: Safety/medical concerns  Function - Comprehension Comprehension: Auditory Comprehension assist level: Follows basic conversation/direction with no assist  Function - Expression Expression: Verbal Expression assistive device: Talk trach valve Expression assist level: Expresses basic 75 - 89% of the time/requires cueing 10 - 24% of the time. Needs helper to occlude trach/needs to repeat words.  Function - Social Interaction Social Interaction assist level: Interacts appropriately 90% of the time - Needs  monitoring or encouragement for participation or interaction.  Function - Problem Solving Problem solving assist level: Solves basic 75 - 89% of the time/requires cueing 10 - 24% of the time  Function - Memory Memory assist level: Recognizes or recalls 75 - 89% of the time/requires cueing 10 - 24% of the time Patient normally able to recall (first 3 days only): Current season   Medical Problem List and Plan: 1.   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  DVT Prophylaxis/Anticoagulation: Pharmaceutical: Lovenox, given bump in creat will reduce to 30m 3. Pain Management:  Will try low dose ultram prn   4. Mood: team to provide ego support. LCSW to follow for evaluation.   5. Neuropsych: This patient is not capable of making decisions on her own behalf. 6. Skin/Wound Care:   Maintain adequate nutrition and hydration.   7. Fluids/Electrolytes/Nutrition: Monitor I/O on current diet.  Check lytes in am. 8. VDRF with trach: now decannulated 9. HTN: Continue norvasc, lisinopril, apresoline, metoprolol and HCTZ.  may reduce HCTZ  Filed Vitals:   08/06/15 2029 08/07/15 0527  BP: 123/72 136/76  Pulse: 70 76  Temp:  97.5 F (36.4 C)  Resp:  16   10 ARF- improving off bactrim and lisinopril, D/C IV heplock  BMP Latest Ref Rng 08/07/2015 08/05/2015 08/04/2015  Glucose 65 - 99 mg/dL 89 91 94  BUN 6 - 20 mg/dL 21(H) 31(H) 40(H)  Creatinine 0.44 - 1.00 mg/dL 1.45(H) 1.28(H) 1.67(H)  Sodium 135 - 145 mmol/L 137 135 131(L)  Potassium 3.5 - 5.1 mmol/L 3.7 4.0 4.0  Chloride 101 - 111 mmol/L 103 105 101  CO2 22 - 32 mmol/L 23 22 22   Calcium 8.9 - 10.3 mg/dL 10.2 10.1 9.9    11. ABLA: Monitor for signs of bleeding. Recheck CBC  In am  CBC Latest Ref Rng 07/25/2015 07/24/2015 07/23/2015  WBC 4.0 - 10.5 K/uL 9.0 8.7 7.5  Hemoglobin 12.0 - 15.0 g/dL 9.4(L) 9.4(L) 10.3(L)  Hematocrit 36.0 - 46.0 % 31.3(L) 31.1(L) 34.0(L)  Platelets 150 - 400 K/uL 412(H) 387 330      12. Dysphagia: Monitor tolerance of dysphagia 2,thins, 75% meals 13. Abnormal LFT:  Discontinue tylenol.? Shock liver, CMET in am 14. Anxiety: Monitor on buspar bid.  15. Diarrhea: improved , daily BMs, no incont  16.   Hypoalbuminemia start Prostat LOS (Days) 14 A FACE TO FACE EVALUATION WAS PERFORMED  Tiarrah Saville E 08/07/2015, 8:01 AM

## 2015-08-08 ENCOUNTER — Inpatient Hospital Stay (HOSPITAL_COMMUNITY): Payer: Self-pay | Admitting: Speech Pathology

## 2015-08-08 ENCOUNTER — Inpatient Hospital Stay (HOSPITAL_COMMUNITY): Payer: Self-pay | Admitting: *Deleted

## 2015-08-08 ENCOUNTER — Inpatient Hospital Stay (HOSPITAL_COMMUNITY): Payer: Self-pay | Admitting: Occupational Therapy

## 2015-08-08 LAB — CBC
HCT: 32 % — ABNORMAL LOW (ref 36.0–46.0)
Hemoglobin: 9.7 g/dL — ABNORMAL LOW (ref 12.0–15.0)
MCH: 22.3 pg — AB (ref 26.0–34.0)
MCHC: 30.3 g/dL (ref 30.0–36.0)
MCV: 73.6 fL — AB (ref 78.0–100.0)
PLATELETS: 480 10*3/uL — AB (ref 150–400)
RBC: 4.35 MIL/uL (ref 3.87–5.11)
RDW: 18 % — AB (ref 11.5–15.5)
WBC: 6 10*3/uL (ref 4.0–10.5)

## 2015-08-08 NOTE — Progress Notes (Signed)
Speech Language Pathology Weekly Progress and Session Note  Patient Details  Name: Gwendolyn Morales MRN: [id number] Date of Birth: 04-27-1965  Beginning of progress report period: August 01, 2015 End of progress report period: August 08, 2015  Today's Date: 08/08/2015 SLP Individual Time: 1100-1200 SLP Individual Time Calculation (min): 60 min  Short Term Goals: Week 2: SLP Short Term Goal 1 (Week 2): Pt will consume therapeutic trials of thin liquids with supervision cues for use of swallowing precautions and minimal overt s/s of aspiration  SLP Short Term Goal 1 - Progress (Week 2): Met SLP Short Term Goal 2 (Week 2): Pt will be intelligible at the sentence level in 75% of opportunities with supervision cues for pacing, overarticulation, and increased vocal intensity.  SLP Short Term Goal 2 - Progress (Week 2): Not met SLP Short Term Goal 3 (Week 2): Pt will sustain her attention to basic, tasks for 20 minutes with mod I redirection to task  SLP Short Term Goal 3 - Progress (Week 2): Met SLP Short Term Goal 4 (Week 2):    SLP Short Term Goal 4 - Progress (Week 2): Met SLP Short Term Goal 5 (Week 2): Pt will monitor and correct verbal errors in 75% of opportunities with supervision verbal cues.  SLP Short Term Goal 5 - Progress (Week 2): Not met    New Short Term Goals: Week 3: SLP Short Term Goal 1 (Week 3): Pt will consume regular diet and thin liquids with Mod I for use of swallowing precautions and minimal overt s/s of aspiration  SLP Short Term Goal 2 (Week 3): Pt will be intelligible at the sentence level in 75% of opportunities with supervision cues for pacing, overarticulation, and increased vocal intensity.  SLP Short Term Goal 3 (Week 3): Pt will demonstrate selective attention to basic tasks in a mildly distracting environment for 30 minutes with supervision cues.  SLP Short Term Goal 4 (Week 3): Pt will recall basic, daily information with Mod I for use of external aids SLP Short  Term Goal 5 (Week 3): Pt will monitor and correct verbal errors in 75% of opportunities with supervision verbal cues.   Weekly Progress Updates: Pt has made progress as evidenced by meeting 3/5 goals. Pt with improvement in sustained attention, recall of basic information and currently tolerates dysphagia 3 diet with thin liquids. Pt continues to exhibit deficits in areas of intelligibility, selective attention in a distracting environment and complex problem solving. Pt  would also benefit from trials of regular diet textures for possible diet advancement. Pt continues ot benefit from skilled ST services to address deficits in cognition, speech intelligibility and dysphagia.    Intensity: Minumum of 1-2 x/day, 30 to 90 minutes Frequency: 3 to 5 out of 7 days Duration/Length of Stay: discharge date 08/17/2015 Treatment/Interventions: Cognitive remediation/compensation;Cueing hierarchy;Dysphagia/aspiration precaution training;Internal/external aids;Patient/family education;Functional tasks   Daily Session  Skilled Therapeutic Interventions: Skilled treatment session focused on dysphagia and cognition goals. SLP facilitated the session by providing skilled observation of dysphagia 3 lunch tray with thin liquids. Pt able to recall compensatory swallow strategies and implemented with Mod I. Pt consumed without oral residue and no overt s/s of aspiration. Pt required Min A verbal cues for overarticulation and increased vocal intensity at the sentence level and was ~50 intelligible. Simple conversation was ~25 to occasionally 50% intelligible in a mildly noisey environment.  Pt with decreased self monitoring and correction of errors. Required Mod A verbal cues. Pt able to sustain attention to basic  task for 60 minutes. Pt returned to room, safety belt donned and all needs within reach. Continue current plan of care.       Function:   Eating Eating   Modified Consistency Diet: Yes Eating Assist Level:  Supervision or verbal cues           Cognition Comprehension Comprehension assist level: Follows complex conversation/direction with no assist  Expression   Expression assist level: Expresses basic 75 - 89% of the time/requires cueing 10 - 24% of the time. Needs helper to occlude trach/needs to repeat words.  Social Interaction Social Interaction assist level: Interacts appropriately 90% of the time - Needs monitoring or encouragement for participation or interaction.  Problem Solving Problem solving assist level: Solves basic 90% of the time/requires cueing < 10% of the time  Memory Memory assist level: Recognizes or recalls 90% of the time/requires cueing < 10% of the time   General    Pain Pain Assessment Pain Assessment: No/denies pain  Therapy/Group: Individual Therapy  Walker Paddack 08/08/2015, 4:16 PM

## 2015-08-08 NOTE — Progress Notes (Signed)
Occupational Therapy Session Note  Patient Details  Name: Gwendolyn Morales MRN: [id number] Date of Birth: 1965-03-14  Today's Date: 08/08/2015 OT Individual Time: 4627-0350 OT Individual Time Calculation (min): 60 min   Short Term Goals: Week 1:  OT Short Term Goal 1 (Week 1): Pt will maintain static/dynamic sittting EOC during UB dressing with supervision. OT Short Term Goal 1 - Progress (Week 1): Met OT Short Term Goal 2 (Week 1): Pt will complete LB bathing sit to stand with max assist of 1. OT Short Term Goal 2 - Progress (Week 1): Met OT Short Term Goal 3 (Week 1): Pt will complete UB dressing in unsupported sitting with supervision. OT Short Term Goal 3 - Progress (Week 1): Met OT Short Term Goal 4 (Week 1): Pt will transfer from supine to sit EOB in preparation for selfcare tasks with supervision.  OT Short Term Goal 4 - Progress (Week 1): Not met OT Short Term Goal 5 (Week 1): Pt will perform squat pivot transfer to drop arm commode with max assist.  OT Short Term Goal 5 - Progress (Week 1): Met   Week 2:  OT Short Term Goal 1 (Week 2): Pt will transfer from supine to sit EOB in preparation for selfcare tasks with supervision.  OT Short Term Goal 2 (Week 2): Pt will complete LB bathing sit to stand with mod assist 2 consecutive sessions. OT Short Term Goal 3 (Week 2): Pt will transfer stand pivot to the 3:1 with mod assist.   OT Short Term Goal 4 (Week 2): Pt will complete tub/shower transfers with mod assist stand pivot with RW.  Skilled Therapeutic Interventions/Progress Updates:  Focused skilled intervention on sit to/from stands, functional transfers, ADL retraining, dynamic standing balance/tolerance/endurance, functional use of RUE, and overall activity tolerance/endurance. Upon entering room, pt found seated in recliner. With +2 for safety, pt performed stand pivot transfer recliner to w/c using RW. Pt then propelled self to sink and performed UB/LB bathing & dressing in sit  to/from stand position. Emphasis and extra time spent on patient donning bra, compensatory strategies taught. Grooming tasks performed seated in w/c at sink. Pt then performed isometric UE exercises. At end of session, left pt seated in w/c with quick release belt donned and all needs within reach.   Therapy Documentation Precautions:  Precautions Precautions: Fall Precaution Comments: trach plugged for therapy Restrictions Weight Bearing Restrictions: No  Vital Signs: Therapy Vitals Temp: 98.4 F (36.9 C) Temp Source: Oral Pulse Rate: 78 Resp: 16 BP: 126/74 mmHg Patient Position (if appropriate): Lying Oxygen Therapy SpO2: 99 % O2 Device: Not Delivered  See Function Navigator for Current Functional Status.  Therapy/Group: Individual Therapy  [name] , MS, OTR/L, CLT  08/08/2015, 10:17 AM

## 2015-08-08 NOTE — Patient Care Conference (Signed)
Inpatient RehabilitationTeam Conference and Plan of Care Update Date: 08/08/2015   Time: 10:50 AM    Patient Name: Gwendolyn Morales      Medical Record Number: [id number]  Date of Birth: [birth year] Sex: Female         Room/Bed: 4M07C/4M07C-01 Payor Info: Payor: MEDICAID POTENTIAL / Plan: MEDICAID POTENTIAL / Product Type: *No Product type* /    Admitting Diagnosis: Gwendolyn Morales - ICH  Admit Date/Time:  07/24/2015  5:01 PM Admission Comments: No comment available   Primary Diagnosis:  Cerebral parenchymal hemorrhage (HCC) Principal Problem: Cerebral parenchymal hemorrhage [facility](HCC)  Patient Active Problem List   Diagnosis Date Noted  . Adjustment disorder with depressed mood   . Cerebral parenchymal hemorrhage (HCC) 07/24/2015  . 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 Elevated bilirubin   . Adjustment disorder with mixed anxiety and depressed mood   . Diastolic dysfunction   . Morbid obesity due to excess calories (HCC)   . H/O noncompliance with medical treatment, presenting hazards to health   . Tachypnea   . Acute blood loss anemia   . Dysphagia, post-stroke   . HTN (hypertension)   . Productive cough   . Tracheostomy status (HCC)   . 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 Hypokalemia   . Azotemia   . Acute respiratory failure (HCC)   . Acute respiratory failure with hypoxemia (HCC)   . 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 ICH (intracerebral hemorrhage) (HCC) 07/12/2015    Expected Discharge Date: Expected Discharge Date: 08/17/15  Team Members Present: Physician leading conference: Dr. Claudette LawsAndrew Morales Social Worker Present: Gwendolyn DerBecky Elonzo Sopp, LCSW Nurse Present: Gwendolyn PortelaJeanna Hicks, RN PT Present: Gwendolyn Morales, PT OT Present: Gwendolyn Morales, OT SLP Present: Gwendolyn Morales, SLP PPS Coordinator present : Gwendolyn DuckMarie Noel, RN, CRRN     Current Status/Progress Goal Weekly Team Focus   Medical   Tracheostomy discontinued, taking adequate by mouth  Maintain adequate nutrition by mouth route  Discharge planning   Bowel/Bladder   Cont of bowe and bladder, LBM 6/26 some urgency  Cont of bowel and bladder  Remain cont of bowel and bladder   Swallow/Nutrition/ Hydration   Dys 3, thin liquids   mod I with least restrictive diet   trials of regular textures    ADL's   Overall min assist (+2 for safety, but pt quickly approaching +1)  Overall supervision to mod I  ADL retraining, sit to/from stands, dynamic standing balance/tolerance/endurance, UE functional use/strengthening/NMR, Pt/Family education, overall activity tolerance/endurance    Mobility   Supervision w/c mobility, min A bed mobility, mod A transfers and gait short distances  min assist transfers, gait x 50' up/down 4 steps, and supervision w/c x 150'  activity tolerance, NMR, balance, progressing transfers and gait to min A   Communication   Mild word finding impairment, mild dysarthria, min assist-supervision for intelligibility at the conversational level   supervision   complex word finding and carryover of intelligibilty strategies    Safety/Cognition/ Behavioral Observations  min assist   supervision   semi-complex tasks    Pain   No c/o pain  <2  Monitor q shift and PRN   Skin   Old trach site scabbed over, MASD to bottom with microguard powder in use  No new skin breakdown while on Rehab  Ass q shift and PRN      *See Care Plan and progress notes for long and short-term goals.  Barriers to Discharge: Bilateral  weakness    Possible Resolutions to Barriers:  Continue rehabilitation, discharge planning    Discharge Planning/Teaching Needs:  HUsband will need to come in and begin learning pt's care along with son.       Team Discussion:  Upgrade goals to supervision level. DC-IV. Dys 3 thin liquids doing well with po intake. Poor carryover, decreased attention and memory. Speech clearer now. Trach  DC. Have husband come in for education next week.  Revisions to Treatment Plan:  Upgraded goals to supervision level   Continued Need for Acute Rehabilitation Level of Care: The patient requires daily medical management by a physician with specialized training in physical medicine and rehabilitation for the following conditions: Daily direction of a multidisciplinary physical rehabilitation program to ensure safe treatment while eliciting the highest outcome that is of practical value to the patient.: Yes Daily medical management of patient stability for increased activity during participation in an intensive rehabilitation regime.: Yes Daily analysis of laboratory values and/or radiology reports with any subsequent need for medication adjustment of medical intervention for : Neurological problems;Nutritional problems  Gwendolyn Morales, Gwendolyn Morales 08/08/2015, 2:30 PM

## 2015-08-08 NOTE — Progress Notes (Signed)
Physical Therapy Session Note  Patient Details  Name: Gwendolyn Morales MRN: [id number] Date of Birth: 11/13/1965  14:15-15:30 (79mn)   Short Term Goals: Week 2:  PT Short Term Goal 1 (Week 2): pt will move supine>< sit with min assist PT Short Term Goal 1 - Progress (Week 2): Progressing toward goal PT Short Term Goal 2 (Week 2): pt will transfer with mod assist consistently PT Short Term Goal 2 - Progress (Week 2): Met PT Short Term Goal 3 (Week 2): pt will perform gait iwth LRAD and and assistance of 1 person, x 100' PT Short Term Goal 3 - Progress (Week 2): Progressing toward goal PT Short Term Goal 4 (Week 2): pt will ascend/descend 4 steps 2 rails with mod assist PT Short Term Goal 4 - Progress (Week 2): Progressing toward goal Week 3:  PT Short Term Goal 1 (Week 3): pt will gait in controlled environment 100' with min A PT Short Term Goal 2 (Week 3): Pt will perform bed mobility with min A PT Short Term Goal 3 (Week 3): Pt will negotiate 4 steps min A with 2 handrails  Skilled Therapeutic Interventions/Progress Updates:  Tx focused on functional mobility training, gait with RW, and therex for strengthening and activity tolerance. [facility]   Therapeutic exercise Pt performed seated marching, LAQ, and ankle pumps 2x10 during seated rest breaks.  Mini-squats with RW x10 Standing heel/toe raises x5 at RW, limited by fatigue.   Sit<>stands x4 throughout tx with up to mod lifting assist from various surfaces including park bench outside. Stand-step transfer with RW and up to mod lifting assist.   Gait in outdoor setting over concrete x75' with bariatric RW and Min A for steadying, including 2 turns. Gait in controlled setting 2 x 30' with Min A and RW. Pt encouraged to attempt continuous gait pattern as able. Increased time required. Pt limited by fatigue, but able to notify PT prior to needing break.   WC propulsion in controlled and community settings x100' with  close S and assist for parts management.   Stair training with 2 rails and min A x8 in step-to pattern, increased time.   Discussed her HEP in room with handouts. Pt reports she has been spending more time OOB, thus not doing bridges, but has been working on others.        Therapy Documentation Precautions:  Precautions Precautions: Fall Precaution Comments: trach plugged for therapy Restrictions Weight Bearing Restrictions: No    Pain: none   See Function Navigator for Current Functional Status.   Therapy/Group: Individual Therapy  CKennieth Rad PT, DPT  08/08/2015, 12:53 PM

## 2015-08-08 NOTE — Progress Notes (Signed)
Subjective/Complaints:  Discussed progress, IV still in  Review of systems negative for shortness of breath, chest pain, nausea vomiting.   Objective: Vital Signs: Blood pressure 126/74, pulse 78, temperature 98.4 F (36.9 C), temperature source Oral, resp. rate 16, weight 107.775 kg (237 lb 9.6 oz), SpO2 99 %. No results found. Results for orders placed or performed during the hospital encounter of 07/24/15 (from the past 72 hour(s))  Comprehensive metabolic panel     Status: Abnormal   Collection Time: 08/07/15  5:21 AM  Result Value Ref Range   Sodium 137 135 - 145 mmol/L   Potassium 3.7 3.5 - 5.1 mmol/L   Chloride 103 101 - 111 mmol/L   CO2 23 22 - 32 mmol/L   Glucose, Bld 89 65 - 99 mg/dL   BUN 21 (H) 6 - 20 mg/dL   Creatinine, Ser 1.45 (H) 0.44 - 1.00 mg/dL   Calcium 10.2 8.9 - 10.3 mg/dL   Total Protein 6.6 6.5 - 8.1 g/dL   Albumin 3.3 (L) 3.5 - 5.0 g/dL   AST 22 15 - 41 U/L   ALT 38 14 - 54 U/L   Alkaline Phosphatase 134 (H) 38 - 126 U/L   Total Bilirubin 0.9 0.3 - 1.2 mg/dL   GFR calc non Af Amer 42 (L) >60 mL/min   GFR calc Af Amer 48 (L) >60 mL/min    Comment: (NOTE) The eGFR has been calculated using the CKD EPI equation. This calculation has not been validated in all clinical situations. eGFR's persistently <60 mL/min signify possible Chronic Kidney Disease.    Anion gap 11 5 - 15     HEENT: trach site with granulation tissue no erythema, epithelium not closed Cardio: RRR and no murmurs or extra sounds Resp: CTA B/L and unlabored GI: BS positive and non-distended nontender Extremity:  Pulses positive and No Edema Skin:   Other ttrach site CDI, IV site left arm CDI Neuro: Alert/Oriented, Cranial Nerve II-XII normal, Normal Sensory, Abnormal Motor 4- /5 bilateral deltoid, biceps, triceps, grip, hip flexor, knee extensor, ankle dorsiflexor and plantar flexor and Abnormal FMC Ataxic/ dec FMC Musc/Skel:  Normal Gen. no acute  distress   Assessment/Plan: 1. 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 Physiatrist is providing close team supervision and 24 hour management of active medical problems listed below. Physiatrist and rehab team continue to assess barriers to discharge/monitor patient progress toward functional and medical goals. FIM: Function - Bathing Position: Shower Body parts bathed by patient: Left arm, Right arm, Chest, Abdomen, Right upper leg, Left upper leg, Front perineal area, Buttocks, Right lower leg, Left lower leg, Back Body parts bathed by helper: Back Bathing not applicable: Front perineal area, Buttocks (Completed earlier with nursing per pt) Assist Level: Supervision or verbal cues  Function- Upper Body Dressing/Undressing What is the patient wearing?: Pull over shirt/dress Bra - Perfomed by patient: Hook/unhook bra (pull down sports bra), Thread/unthread left bra strap, Thread/unthread right bra strap (Mod assist needed for turning bra around after fastening it in the front) Bra - Perfomed by helper: Thread/unthread right bra strap, Thread/unthread left bra strap, Hook/unhook bra (pull down sports bra) Pull over shirt/dress - Perfomed by patient: Thread/unthread left sleeve, Put head through opening, Thread/unthread right sleeve, Pull shirt over trunk (sit to/from stand in STEDY) Pull over shirt/dress - Perfomed by helper: Thread/unthread right sleeve, Thread/unthread  left sleeve, Put head through opening, Pull shirt over trunk Assist Level: Touching or steadying assistance(Pt > 75%) Function - Lower Body Dressing/Undressing Lower body dressing/undressing activity did not occur: N/A (pt wearing dress today) What is the patient wearing?: Non-skid slipper socks, Pants, Socks Position: Wheelchair/chair at sink Pants-  Performed by patient: Thread/unthread right pants leg, Thread/unthread left pants leg, Pull pants up/down Pants- Performed by helper: Thread/unthread right pants leg, Thread/unthread left pants leg, Pull pants up/down Non-skid slipper socks- Performed by patient: Don/doff right sock, Don/doff left sock Non-skid slipper socks- Performed by helper: Don/doff left sock Assist for footwear: Setup Assist for lower body dressing: Touching or steadying assistance (Pt > 75%)  Function - Toileting Toileting activity did not occur: No continent bowel/bladder event Toileting steps completed by patient: Adjust clothing prior to toileting, Performs perineal hygiene Toileting steps completed by helper: Adjust clothing after toileting Toileting Assistive Devices: Grab bar or rail Assist level: Touching or steadying assistance (Pt.75%)  Function - Air cabin crew transfer activity did not occur: N/A Toilet transfer assistive device: Grab bar Assist level to toilet: Touching or steadying assistance (Pt > 75%) Assist level from toilet: Maximal assist (Pt 25 - 49%/lift and lower)  Function - Chair/bed transfer Chair/bed transfer method: Stand pivot Chair/bed transfer assist level: Moderate assist (Pt 50 - 74%/lift or lower) Chair/bed transfer assistive device: Armrests Mechanical lift: Stedy Chair/bed transfer details: Manual facilitation for placement, Manual facilitation for weight bearing, Verbal cues for technique, Manual facilitation for weight shifting  Function - Locomotion: Wheelchair Will patient use wheelchair at discharge?: Yes Type: Manual Max wheelchair distance: 100 Assist Level: Supervision or verbal cues Assist Level: Supervision or verbal cues Wheel 150 feet activity did not occur: Safety/medical concerns Assist Level: Supervision or verbal cues Turns around,maneuvers to table,bed, and toilet,negotiates 3% grade,maneuvers on rugs and over doorsills: No Function - Locomotion:  Ambulation Ambulation activity did not occur: Safety/medical concerns (unsafe to attempt due to weakness) Assistive device: Ethelene Hal Max distance: 90 Assist level: Moderate assist (Pt 50 - 74%) Assist level: Moderate assist (Pt 50 - 74%) Walk 50 feet with 2 turns activity did not occur: Safety/medical concerns Assist level: Touching or steadying assistance (Pt > 75%) Walk 150 feet activity did not occur: Safety/medical concerns Walk 10 feet on uneven surfaces activity did not occur: Safety/medical concerns  Function - Comprehension Comprehension: Auditory Comprehension assist level: Follows complex conversation/direction with no assist  Function - Expression Expression: Verbal Expression assistive device: Talk trach valve Expression assist level: Expresses basic 75 - 89% of the time/requires cueing 10 - 24% of the time. Needs helper to occlude trach/needs to repeat words.  Function - Social Interaction Social Interaction assist level: Interacts appropriately 90% of the time - Needs monitoring or encouragement for participation or interaction.  Function - Problem Solving Problem solving assist level: Solves basic 75 - 89% of the time/requires cueing 10 - 24% of the time  Function - Memory Memory assist level: Recognizes or recalls 75 - 89% of the time/requires cueing 10 - 24% of the time Patient normally able to recall (first 3 days only): Current season   Medical Problem List and Plan: 1.  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  DVT Prophylaxis/Anticoagulation: Pharmaceutical: Lovenox, given bump in creat will reduce to 36m 3. Pain Management:  Will try low  dose ultram prn   4. Mood: team to provide ego support. LCSW to follow for evaluation.   5.  Neuropsych: This patient is not capable of making decisions on her own behalf. 6. Skin/Wound Care:   Maintain adequate nutrition and hydration.   7. Fluids/Electrolytes/Nutrition: Monitor I/O on current diet.  Check lytes in am. 8. VDRF  now decannulated 9. HTN: Continue norvasc, lisinopril, apresoline, metoprolol and HCTZ.   reduced HCTZ  Filed Vitals:   08/07/15 2159 08/08/15 0535  BP: 127/79 126/74  Pulse: 74 78  Temp: 98.8 F (37.1 C) 98.4 F (36.9 C)  Resp: 18 16   10  ARF- improving off bactrim and lisinopril, D/C IV heplock  BMP Latest Ref Rng 08/07/2015 08/05/2015 08/04/2015  Glucose 65 - 99 mg/dL 89 91 94  BUN 6 - 20 mg/dL 21(H) 31(H) 40(H)  Creatinine 0.44 - 1.00 mg/dL 1.45(H) 1.28(H) 1.67(H)  Sodium 135 - 145 mmol/L 137 135 131(L)  Potassium 3.5 - 5.1 mmol/L 3.7 4.0 4.0  Chloride 101 - 111 mmol/L 103 105 101  CO2 22 - 32 mmol/L 23 22 22   Calcium 8.9 - 10.3 mg/dL 10.2 10.1 9.9    11. ABLA: Monitor for signs of bleeding. Recheck CBC  In am  CBC Latest Ref Rng 07/25/2015 07/24/2015 07/23/2015  WBC 4.0 - 10.5 K/uL 9.0 8.7 7.5  Hemoglobin 12.0 - 15.0 g/dL 9.4(L) 9.4(L) 10.3(L)  Hematocrit 36.0 - 46.0 % 31.3(L) 31.1(L) 34.0(L)  Platelets 150 - 400 K/uL 412(H) 387 330      12. Dysphagia: Monitor tolerance of dysphagia 2,thins, 75% meals 13. Abnormal LFT:  basically resolved  14. Anxiety: Monitor on buspar bid.  15. Diarrhea: improved , daily BMs, no incont  16.  Hypoalbuminemia start Prostat LOS (Days) 15 A FACE TO FACE EVALUATION WAS PERFORMED  KIRSTEINS,ANDREW E 08/08/2015, 8:04 AM

## 2015-08-08 NOTE — Consult Note (Signed)
Neuropsychology Psychosocial - Confidential [place] [facility] _____________________________________________________________________________   Ms. Jerre SimonSherry Almas was seen for follow-up today to continue to provide assistance with adjustment to illness.  She previously completed a psychodiagnostic assessment with this provider, which indicated the presence of adjustment disorder with depression.    During today's session, Ms. Molli Barrowsresnell stated that her underlying mood was "pretty much the same" and cited recent stress associated with a visit from her father and son as worsening her mood that had been improving.  She did, however, notice that she has been improving physically; something she had not been able to appreciate at the time of our last session.  She said that it remains difficult to truly believe that she has had a stroke.  While she noted that she is still uncomfortable letting out all of her emotion, she has been able to let out a little bit while alone in her room.  She also noted that she has been using a stress block to help release tension, which has been helpful.  In addition, she changed her mindset regarding family stress and noted that she will "stop worrying about drama and focus on getting better."  Despite her report of mood remaining constant since our last session, she seemed in higher spirits today.    Time was spent during today's session processing continued stressors and in continuing to use strengths-based techniques to help her adjust her self-image and empower her in her physical and mental recovery.  Ms. Molli Barrowsresnell reported that she remains interested in participating in individual psychotherapy post-discharge.  Also, continued follow-up with the neuropsychologist prior to discharge could be requested, as indicated.     DIAGNOSES: hemorrhage Adjustment disorder with depressed mood  Greater than 50% of this visit was spent educating the patient about the  possible diagnosis, prognosis, management plan, and in coordination of care.   Leavy CellaKaren Madelynne Lasker, PsyD Clinical Neuropsychologist

## 2015-08-09 ENCOUNTER — Inpatient Hospital Stay (HOSPITAL_COMMUNITY): Payer: Self-pay | Admitting: Occupational Therapy

## 2015-08-09 ENCOUNTER — Inpatient Hospital Stay (HOSPITAL_COMMUNITY): Payer: Self-pay | Admitting: Speech Pathology

## 2015-08-09 ENCOUNTER — Inpatient Hospital Stay (HOSPITAL_COMMUNITY): Payer: Self-pay | Admitting: *Deleted

## 2015-08-09 DIAGNOSIS — I612 Nontraumatic intracerebral hemorrhage in hemisphere, unspecified: Secondary | ICD-10-CM

## 2015-08-09 NOTE — Progress Notes (Signed)
Speech Language Pathology Daily Session Note  Patient Details  Name: Gwendolyn SimonSherry Morales MRN: [id number] Date of Birth: 1965-11-28  Today's Date: 08/09/2015 SLP Individual Time: 1300-1400 SLP Individual Time Calculation (min): 60 min  Short Term Goals: Week 3: SLP Short Term Goal 1 (Week 3): Pt will consume regular diet and thin liquids with Mod I for use of swallowing precautions and minimal overt s/s of aspiration  SLP Short Term Goal 2 (Week 3): Pt will be intelligible at the sentence level in 75% of opportunities with supervision cues for pacing, overarticulation, and increased vocal intensity.  SLP Short Term Goal 3 (Week 3): Pt will demonstrate selective attention to basic tasks in a mildly distracting environment for 30 minutes with supervision cues.  SLP Short Term Goal 4 (Week 3): Pt will recall basic, daily information with Mod I for use of external aids SLP Short Term Goal 5 (Week 3): Pt will monitor and correct verbal errors in 75% of opportunities with supervision verbal cues.   Skilled Therapeutic Interventions: Skilled treatment session focused on dysphagia and cognition goals. SLP provided skilled observation of dysphagia 3 lunch tray with thin liquids. Pt with effective bolus mananagement and complete oral clearing with food. Pt able to recall compensatory swallow strategies (such as chin tuck) and implement this strategy with Mod I. Pt demonstrated selective attention to basic task (compiling grocery list and locating items in advertisement with given amount of money) with supervision cues for 30 minutes in a mildly distracting environment. Pt with decreased self-monitoring of intelligibility. Pt with decreased intelligibility this session. She stated that she was exceptionally fatigued. She was ~50 intelligible at the sentence level. Pt returned to room and all needs were left within reach. Continue current plan of care.   Function:  Eating Eating   Modified Consistency Diet:  Yes Eating Assist Level: Supervision or verbal cues           Cognition Comprehension Comprehension assist level: Follows complex conversation/direction with extra time/assistive device  Expression   Expression assist level: Expresses basic 75 - 89% of the time/requires cueing 10 - 24% of the time. Needs helper to occlude trach/needs to repeat words.  Social Interaction Social Interaction assist level: Interacts appropriately 90% of the time - Needs monitoring or encouragement for participation or interaction.  Problem Solving Problem solving assist level: Solves basic 90% of the time/requires cueing < 10% of the time  Memory Memory assist level: Recognizes or recalls 90% of the time/requires cueing < 10% of the time    Pain    Therapy/Group: Individual Therapy  Miamarie Moll 08/09/2015, 3:57 PM

## 2015-08-09 NOTE — Progress Notes (Signed)
Social Work Patient ID: Gwendolyn Morales, female   DOB: [birth year], 50 y.o.   MRN: [id number] Met with pt and spoke with husband via telephone to discuss team conference progression toward goals and upgrading goals to supervision since pt has done so well. Husband and pt were told by MD she was being discharged Tuesday which was a mistake. Have scheduled husband to come in Tuesday from 9:00-12:00 for family training. Have discussed [facility] and husband needing to go there and complete applications. Will work on discharge needs. Both pleased with pt;s progress while here.

## 2015-08-09 NOTE — Progress Notes (Signed)
Subjective/Complaints:  No new issues. Feels sore from therapies but soreness tolerable. Slept well.  Review of systems negative for shortness of breath, chest pain, nausea vomiting.   Objective: Vital Signs: Blood pressure 136/74, pulse 82, temperature 98.3 F (36.8 C), temperature source Oral, resp. rate 16, weight 102 kg (224 lb 13.9 oz), SpO2 99 %. No results found. Results for orders placed or performed during the hospital encounter of 07/24/15 (from the past 72 hour(s))  Comprehensive metabolic panel     Status: Abnormal   Collection Time: 08/07/15  5:21 AM  Result Value Ref Range   Sodium 137 135 - 145 mmol/L   Potassium 3.7 3.5 - 5.1 mmol/L   Chloride 103 101 - 111 mmol/L   CO2 23 22 - 32 mmol/L   Glucose, Bld 89 65 - 99 mg/dL   BUN 21 (H) 6 - 20 mg/dL   Creatinine, Ser 1.45 (H) 0.44 - 1.00 mg/dL   Calcium 10.2 8.9 - 10.3 mg/dL   Total Protein 6.6 6.5 - 8.1 g/dL   Albumin 3.3 (L) 3.5 - 5.0 g/dL   AST 22 15 - 41 U/L   ALT 38 14 - 54 U/L   Alkaline Phosphatase 134 (H) 38 - 126 U/L   Total Bilirubin 0.9 0.3 - 1.2 mg/dL   GFR calc non Af Amer 42 (L) >60 mL/min   GFR calc Af Amer 48 (L) >60 mL/min    Comment: (NOTE) The eGFR has been calculated using the CKD EPI equation. This calculation has not been validated in all clinical situations. eGFR's persistently <60 mL/min signify possible Chronic Kidney Disease.    Anion gap 11 5 - 15  CBC     Status: Abnormal   Collection Time: 08/08/15 10:23 AM  Result Value Ref Range   WBC 6.0 4.0 - 10.5 K/uL   RBC 4.35 3.87 - 5.11 MIL/uL   Hemoglobin 9.7 (L) 12.0 - 15.0 g/dL   HCT 32.0 (L) 36.0 - 46.0 %   MCV 73.6 (L) 78.0 - 100.0 fL   MCH 22.3 (L) 26.0 - 34.0 pg   MCHC 30.3 30.0 - 36.0 g/dL   RDW 18.0 (H) 11.5 - 15.5 %   Platelets 480 (H) 150 - 400 K/uL     HEENT: trach site with granulation tissue no erythema, epithelium not closed Cardio: RRR and no murmurs or extra sounds Resp: CTA B/L and unlabored GI: BS positive  and non-distended nontender Extremity:  Pulses positive and No Edema Skin:   Other ttrach site CDI, IV site left arm CDI Neuro: Alert/Oriented, Cranial Nerve II-XII normal, Normal Sensory, Abnormal Motor 4- /5 bilateral deltoid, biceps, triceps, grip, hip flexor, knee extensor, ankle dorsiflexor and plantar flexor and Abnormal FMC Ataxic/ dec FMC Musc/Skel:  Normal Gen. no acute distress   Assessment/Plan: 1. 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 Physiatrist is providing close team supervision and 24 hour management of active medical problems listed below. Physiatrist and rehab team continue to assess barriers to discharge/monitor patient progress toward functional and medical goals. FIM: Function - Bathing Position: Wheelchair/chair at sink Body parts bathed by patient: Left arm, Right arm, Chest, Abdomen, Right upper leg, Left upper leg, Front perineal area, Buttocks, Right lower leg, Left lower leg, Back Body parts bathed by helper: Back Bathing not applicable: Front perineal area, Buttocks (Completed earlier with nursing per pt)  Assist Level: Touching or steadying assistance(Pt > 75%) (min guard/assist for standing)  Function- Upper Body Dressing/Undressing What is the patient wearing?: Pull over shirt/dress, Bra Bra - Perfomed by patient: Hook/unhook bra (pull down sports bra), Thread/unthread left bra strap, Thread/unthread right bra strap Bra - Perfomed by helper: Thread/unthread right bra strap, Thread/unthread left bra strap, Hook/unhook bra (pull down sports bra) Pull over shirt/dress - Perfomed by patient: Thread/unthread left sleeve, Put head through opening, Thread/unthread right sleeve, Pull shirt over trunk Pull over shirt/dress - Perfomed by helper: Thread/unthread right sleeve, Thread/unthread left  sleeve, Put head through opening, Pull shirt over trunk Assist Level: Supervision or verbal cues, Set up Set up : To obtain clothing/put away Function - Lower Body Dressing/Undressing Lower body dressing/undressing activity did not occur: N/A (pt wearing dress today) What is the patient wearing?: Socks Position: Wheelchair/chair at sink Pants- Performed by patient: Thread/unthread right pants leg, Thread/unthread left pants leg, Pull pants up/down Pants- Performed by helper: Thread/unthread right pants leg, Thread/unthread left pants leg, Pull pants up/down Non-skid slipper socks- Performed by patient: Don/doff right sock, Don/doff left sock Non-skid slipper socks- Performed by helper: Don/doff left sock Socks - Performed by patient: Don/doff right sock, Don/doff left sock Assist for footwear: Setup Assist for lower body dressing: Supervision or verbal cues  Function - Toileting Toileting activity did not occur: No continent bowel/bladder event Toileting steps completed by patient: Adjust clothing prior to toileting, Performs perineal hygiene Toileting steps completed by helper: Adjust clothing after toileting Toileting Assistive Devices: Grab bar or rail Assist level: Touching or steadying assistance (Pt.75%)  Function - Air cabin crew transfer activity did not occur: N/A Toilet transfer assistive device: Grab bar Assist level to toilet: Touching or steadying assistance (Pt > 75%) Assist level from toilet: Maximal assist (Pt 25 - 49%/lift and lower)  Function - Chair/bed transfer Chair/bed transfer method: Ambulatory Chair/bed transfer assist level: Touching or steadying assistance (Pt > 75%) Chair/bed transfer assistive device: Armrests, Walker Mechanical lift: Stedy Chair/bed transfer details: Manual facilitation for placement, Manual facilitation for weight bearing, Verbal cues for technique, Manual facilitation for weight shifting  Function - Locomotion:  Wheelchair Will patient use wheelchair at discharge?: Yes Type: Manual Max wheelchair distance: 50 Assist Level: Supervision or verbal cues Assist Level: Supervision or verbal cues Wheel 150 feet activity did not occur: Safety/medical concerns Assist Level: Supervision or verbal cues Turns around,maneuvers to table,bed, and toilet,negotiates 3% grade,maneuvers on rugs and over doorsills: No Function - Locomotion: Ambulation Ambulation activity did not occur: Safety/medical concerns (unsafe to attempt due to weakness) Assistive device: Walker-rolling Max distance: 25 Assist level: Touching or steadying assistance (Pt > 75%) Assist level: Touching or steadying assistance (Pt > 75%) Walk 50 feet with 2 turns activity did not occur: Safety/medical concerns Assist level: Touching or steadying assistance (Pt > 75%) Walk 150 feet activity did not occur: Safety/medical concerns Walk 10 feet on uneven surfaces activity did not occur: Safety/medical concerns  Function - Comprehension Comprehension: Auditory Comprehension assist level: Follows complex conversation/direction with no assist  Function - Expression Expression: Verbal Expression assistive device: Talk trach valve Expression assist level: Expresses basic 75 - 89% of the time/requires cueing 10 - 24% of the time. Needs helper to occlude trach/needs to repeat words.  Function - Social Interaction Social Interaction assist level: Interacts appropriately 90% of the time - Needs monitoring or encouragement for participation or interaction.  Function - Problem Solving Problem solving assist level: Solves basic 90% of the time/requires cueing <  10% of the time  Function - Memory Memory assist level: Recognizes or recalls 90% of the time/requires cueing < 10% of the time Patient normally able to recall (first 3 days only): Current season   Medical Problem List and Plan: 1.  Functional deficits, right hemiparesis and general debility  secondary to hypertensive left parietal and pontine hemorrhages with subsequent complications,in addition has some left hemiparesis  Related to Right frontoparietal subacute infarcts Cont CIR PT, OT SLP  2.  DVT Prophylaxis/Anticoagulation: Pharmaceutical: Lovenox  82m daily 3. Pain Management:    low dose ultram prn effective  -pain generally muscle soreness at this point   4. Mood: team to provide ego support. LCSW to follow for evaluation.   5. Neuropsych: This patient is not capable of making decisions on her own behalf. 6. Skin/Wound Care:   Maintain adequate nutrition and hydration.   7. Fluids/Electrolytes/Nutrition: Monitor I/O on current diet.  Check lytes in am. 8. VDRF  now decannulated 9. HTN: Continue norvasc, lisinopril, apresoline, metoprolol and HCTZ.   reduced HCTZ  Filed Vitals:   08/09/15 0756 08/09/15 0757  BP: 136/74   Pulse:  82  Temp:    Resp:     10 ARF- improving off bactrim and lisinopril   BMP Latest Ref Rng 08/07/2015 08/05/2015 08/04/2015  Glucose 65 - 99 mg/dL 89 91 94  BUN 6 - 20 mg/dL 21(H) 31(H) 40(H)  Creatinine 0.44 - 1.00 mg/dL 1.45(H) 1.28(H) 1.67(H)  Sodium 135 - 145 mmol/L 137 135 131(L)  Potassium 3.5 - 5.1 mmol/L 3.7 4.0 4.0  Chloride 101 - 111 mmol/L 103 105 101  CO2 22 - 32 mmol/L 23 22 22   Calcium 8.9 - 10.3 mg/dL 10.2 10.1 9.9    11. ABLA: Monitor for signs of bleeding. Recheck CBC  In am  CBC Latest Ref Rng 08/08/2015 07/25/2015 07/24/2015  WBC 4.0 - 10.5 K/uL 6.0 9.0 8.7  Hemoglobin 12.0 - 15.0 g/dL 9.7(L) 9.4(L) 9.4(L)  Hematocrit 36.0 - 46.0 % 32.0(L) 31.3(L) 31.1(L)  Platelets 150 - 400 K/uL 480(H) 412(H) 387      12. Dysphagia: Monitor tolerance of dysphagia 2,thins, 75% meals 13. Abnormal LFT:  basically resolved  14. Anxiety: Monitor on buspar bid.  15. Diarrhea: improved , daily BMs, no incont  16.  Hypoalbuminemia:  Prostat LOS (Days) 16 A FACE TO FACE EVALUATION WAS PERFORMED  Madiha Bambrick T 08/09/2015, 9:15 AM

## 2015-08-09 NOTE — Progress Notes (Signed)
Physical Therapy Session Note  Patient Details  Name: Gwendolyn Morales MRN: [id number] Date of Birth: September 03, 1965  Today's Date: 08/09/2015 PT Individual Time: 0815-0930 PT Individual Time Calculation (min): 75 min   Short Term Goals: Week 2:  PT Short Term Goal 1 (Week 2): pt will move supine>< sit with min assist PT Short Term Goal 1 - Progress (Week 2): Progressing toward goal PT Short Term Goal 2 (Week 2): pt will transfer with mod assist consistently PT Short Term Goal 2 - Progress (Week 2): Met PT Short Term Goal 3 (Week 2): pt will perform gait iwth LRAD and and assistance of 1 person, x 100' PT Short Term Goal 3 - Progress (Week 2): Progressing toward goal PT Short Term Goal 4 (Week 2): pt will ascend/descend 4 steps 2 rails with mod assist PT Short Term Goal 4 - Progress (Week 2): Progressing toward goal  Skilled Therapeutic Interventions/Progress Updates:    Tx focused on functional mobility training, gait with RW, therex for stretching, and apartment mobility. Pt up in [facility], feeling quite sore from all of yesterday's activity. RN brought pain meds and tx was modified with rest breaks and stretching for comfort.   Pt performed WC mobility x50' with S in controlled setting, limited by fatigue and soreness.  Stand-step transfers with RW Min A for steadying only and cues for hand placement. Sit<>stands x4 throughout tx to RW with min A.  Gait with RW x25', limited by fatigue, min-guard A and cues for posture.  Mobility in apartment x15' on carpet to bed with min-guard A.  Sit<>supine in bed with S, disucsed home where pt has waterbed.   Supine therex for stretching in all motions at hip, knee, and ankle as well as lower trunk rotations. Pt performed ankle pumps and glute sets x20 bil.  Standing heel/toe raises x10 at RW Nustep x8 min level 3 for strengthening and activity tolerance with 4 extremities and 1 rest break.   Pt left up in [facility] with all needs in reach. Pt continues to make  good progress towards her goals.   Therapy Documentation Precautions:  Precautions Precautions: Fall Precaution Comments: trach plugged for therapy Restrictions Weight Bearing Restrictions: No General:   Vital Signs: Therapy Vitals Temp: 98.3 F (36.8 C) Temp Source: Oral Pulse Rate: 82 Resp: 16 BP: 136/74 mmHg Oxygen Therapy SpO2: 99 % O2 Device: Not Delivered Pain:  7/10 sore bil LEs   See Function Navigator for Current Functional Status.   Therapy/Group: Individual Therapy  Soundra Pilon 08/09/2015, 8:45 AM

## 2015-08-09 NOTE — Progress Notes (Signed)
Occupational Therapy Session Note  Patient Details  Name: Gwendolyn Morales MRN: [id number] Date of Birth: 11-Aug-1965  Today's Date: 08/09/2015 OT Individual Time: 1400-1500 OT Individual Time Calculation (min): 60 min   Short Term Goals: Week 1:  OT Short Term Goal 1 (Week 1): Pt will maintain static/dynamic sittting EOC during UB dressing with supervision. OT Short Term Goal 1 - Progress (Week 1): Met OT Short Term Goal 2 (Week 1): Pt will complete LB bathing sit to stand with max assist of 1. OT Short Term Goal 2 - Progress (Week 1): Met OT Short Term Goal 3 (Week 1): Pt will complete UB dressing in unsupported sitting with supervision. OT Short Term Goal 3 - Progress (Week 1): Met OT Short Term Goal 4 (Week 1): Pt will transfer from supine to sit EOB in preparation for selfcare tasks with supervision.  OT Short Term Goal 4 - Progress (Week 1): Not met OT Short Term Goal 5 (Week 1): Pt will perform squat pivot transfer to drop arm commode with max assist.  OT Short Term Goal 5 - Progress (Week 1): Met   Week 2:  OT Short Term Goal 1 (Week 2): Pt will transfer from supine to sit EOB in preparation for selfcare tasks with supervision.  OT Short Term Goal 2 (Week 2): Pt will complete LB bathing sit to stand with mod assist 2 consecutive sessions. OT Short Term Goal 3 (Week 2): Pt will transfer stand pivot to the 3:1 with mod assist.   OT Short Term Goal 4 (Week 2): Pt will complete tub/shower transfers with mod assist stand pivot with RW.  Skilled Therapeutic Interventions/Progress Updates:  Patient found seated in w/c upon OT entering room, pt with no complaints of pain. Pt propelled self from room to therapy gym. Pt with decreased memory to lock brakes for safety when transferring to/from w/c. Therapist printed out multiple sheets that stated "Lock Brakes" to post around room and on patient's RW (posted in room and BR). Pt engaged in therapeutic activity focusing on sit to/from stands,  dynamic standing balance/tolerance/endurance, memory, and functional use & strengthening of Rt hand. Had pt work on focusing on pincer grasp during activity and therapist had to cue moderately to maintain this pincer grasp and not use 3 point pinch grasp. Pt required multiple seated breaks during activity. For memory, therapist told patient order as to which to put pins on pole and order as to which to take them down; pt required min cueing for memory, to remember order. Pt propelled self from therapy gym to ADL apartment for simulated tub/shower transfer using tub transfer bench. Encouraged pt to purchase tub transfer bench for home use. Pt propelled herself back to room and at end of session, left pt seated in w/c with all needs within reach.   Therapy Documentation Precautions:  Precautions Precautions: Fall Precaution Comments: trach plugged for therapy Restrictions Weight Bearing Restrictions: No  Vital Signs: Therapy Vitals Temp: 98.3 F (36.8 C) Temp Source: Oral Pulse Rate: 82 Resp: 16 BP: 136/74 mmHg Oxygen Therapy SpO2: 99 % O2 Device: Not Delivered  See Function Navigator for Current Functional Status.  Therapy/Group: Individual Therapy  [name] , MS, OTR/L, CLT  08/09/2015, 3:00 PM

## 2015-08-10 ENCOUNTER — Inpatient Hospital Stay (HOSPITAL_COMMUNITY): Payer: Self-pay | Admitting: Speech Pathology

## 2015-08-10 ENCOUNTER — Inpatient Hospital Stay (HOSPITAL_COMMUNITY): Payer: Self-pay | Admitting: Physical Therapy

## 2015-08-10 ENCOUNTER — Inpatient Hospital Stay (HOSPITAL_COMMUNITY): Payer: Self-pay | Admitting: Occupational Therapy

## 2015-08-10 NOTE — Progress Notes (Signed)
Speech Language Pathology Daily Session Note  Patient Details  Name: Gwendolyn Morales MRN: [id number] Date of Birth: [birth year]  Today's Date: 08/10/2015 SLP Individual Time: [id number] SLP Individual Time Calculation (min): 60 min  Short Term Goals: Week 3: SLP Short Term Goal 1 (Week 3): Pt will consume regular diet and thin liquids with Mod I for use of swallowing precautions and minimal overt s/s of aspiration  SLP Short Term Goal 2 (Week 3): Pt will be intelligible at the sentence level in 75% of opportunities with supervision cues for pacing, overarticulation, and increased vocal intensity.  SLP Short Term Goal 3 (Week 3): Pt will demonstrate selective attention to basic tasks in a mildly distracting environment for 30 minutes with supervision cues.  SLP Short Term Goal 4 (Week 3): Pt will recall basic, daily information with Mod I for use of external aids SLP Short Term Goal 5 (Week 3): Pt will monitor and correct verbal errors in 75% of opportunities with supervision verbal cues.   Skilled Therapeutic Interventions:  Pt was seen for skilled ST targeting cognitive goals.  Pt made comment to therapist that she had been receiving "carrots for every meal" and with questions regarding varied food selections.  Therapist oriented pt to hospital menu, after which pt was then able to utilize with mod I to plan her next three meals.  SLP facilitated the session with a semi-complex catalog shopping task functional problem solving.  Pt selectively attended to task in a mildly distracting environment for >30 minutes with mod I, no cues needed for redirection.  Pt even demonstrated instances of alternating attention between task and therapist without losing her place in task. Pt was able to plan and execute a problem solving strategy during the abovementioned task with distant supervision which therapist was able to fade to mod I as task progressed.  Supervision verbal cues needed for organization when using  calculator to complete functional math calculations related to task.  Pt was left in wheelchair at the end of today's therapy session with call bell within reach.  Continue per current plan of care.    Function:  Eating Eating                 Cognition Comprehension Comprehension assist level: Follows complex conversation/direction with extra time/assistive device  Expression   Expression assist level: Expresses basic 90% of the time/requires cueing < 10% of the time.  Social Interaction Social Interaction assist level: Interacts appropriately 90% of the time - Needs monitoring or encouragement for participation or interaction.  Problem Solving Problem solving assist level: Solves basic 90% of the time/requires cueing < 10% of the time  Memory Memory assist level: Recognizes or recalls 75 - 89% of the time/requires cueing 10 - 24% of the time    Pain Pain Assessment Pain Assessment: No/denies pain  Therapy/Group: Individual Therapy  Gwendolyn Morales, Gwendolyn Morales 08/10/2015, 3:51 PM

## 2015-08-10 NOTE — Progress Notes (Signed)
Occupational Therapy Session Note & Weekly Progress Note  Patient Details  Name: Gwendolyn Morales MRN: [id number] Date of Birth: [birth year]  SESSION NOTE  Today's Date: 08/10/2015 OT Individual Time: 4782-9562 OT Individual Time Calculation (min): 72 min  Individual Therapy Pt found seated up in w/c with no complaints of pain. Discussed bed mobility independence with pt and pt reports she is able to go from supine to sit with no physical assistance. Pt worked on Optometrist and pt propelled self form room to tub room for tub/shower transfer onto tub transfer bench for ADL retraining simulated like pt will be completing at home. Pt able to ambulate a few feet to transfer onto tub transfer bench with min guard assist for ambulation and transfer. Pt does require min cueing for safety with RW, w/c, and transfers. UB/LB bathing completed in seated position, lateral leans for peri cleansing. Pt dried seated on tub transfer bench, then turned to get legs out of tub and pt performed UB/LB dressing in sit to/from stand position. Therapist educated pt on best routine for LB dressing to ensure safety with sit to/from stands. Pt ambulated a few feet back to w/c and propelled self back to room for grooming tasks seated in w/c at sink. At end of session, left pt seated in w/c with all needs within reach. Went over all STGs and LTGs with patient and discussed plan of care. Pt agreed with goals and is motivated by her progress she's made this far. ---------------------------------------------------------------------------------------------------------------------------------------------------------  WEEKLY NOTE  Beginning of progress report period: August 01, 2015 End of progress report period: August 10, 2015  Patient has met 4 of 4 short term goals.  Patient making great progress towards OT goals, continue plan of care for now. Pt eager to go home 08/17/15. No family has been in for education as of yet, to this  date. Talked with CSW and plan is for husband to come in for education on 08/14/15 prior to patient discharging home. Pt reports that when she discharges, husband plans to take off a few days from work in order to provide her with 24/7 supervision/assistance.   Patient continues to demonstrate the following deficits: decreased safety awareness, decreased memory, decreased ADL independence, decreased functional mobility/transfer independence, and decreased overall activity tolerance/endurance. Therefore, patient will continue to benefit from skilled OT intervention to enhance overall performance with BADL, iADL and Reduce care partner burden.  Patient progressing toward long term goals..  Continue plan of care.  OT Short Term Goals Week 1:  OT Short Term Goal 1 (Week 1): Pt will maintain static/dynamic sittting EOC during UB dressing with supervision. OT Short Term Goal 1 - Progress (Week 1): Met OT Short Term Goal 2 (Week 1): Pt will complete LB bathing sit to stand with max assist of 1. OT Short Term Goal 2 - Progress (Week 1): Met OT Short Term Goal 3 (Week 1): Pt will complete UB dressing in unsupported sitting with supervision. OT Short Term Goal 3 - Progress (Week 1): Met OT Short Term Goal 4 (Week 1): Pt will transfer from supine to sit EOB in preparation for selfcare tasks with supervision.  OT Short Term Goal 4 - Progress (Week 1): Not met OT Short Term Goal 5 (Week 1): Pt will perform squat pivot transfer to drop arm commode with max assist.  OT Short Term Goal 5 - Progress (Week 1): Met   Week 2:  OT Short Term Goal 1 (Week 2): Pt will transfer from supine to  sit EOB in preparation for selfcare tasks with supervision.  OT Short Term Goal 1 - Progress (Week 2): Met OT Short Term Goal 2 (Week 2): Pt will complete LB bathing sit to stand with mod assist 2 consecutive sessions. OT Short Term Goal 2 - Progress (Week 2): Met OT Short Term Goal 3 (Week 2): Pt will transfer stand pivot to the  3:1 with mod assist.   OT Short Term Goal 3 - Progress (Week 2): Met OT Short Term Goal 4 (Week 2): Pt will complete tub/shower transfers with mod assist stand pivot with RW. OT Short Term Goal 4 - Progress (Week 2): Met   Week 3:  OT Short Term Goal 1 (Week 3): STGs = LTGs  Skilled Therapeutic Interventions/Progress Updates:  Balance/vestibular training;Neuromuscular re-education;Self Care/advanced ADL retraining;Therapeutic Exercise;UE/LE Strength taining/ROM;DME/adaptive equipment instruction;Cognitive remediation/compensation;Community reintegration;Patient/family education;UE/LE Coordination activities;Discharge planning;Functional mobility training;Therapeutic Activities;Psychosocial support   Therapy Documentation Precautions:  Precautions Precautions: Fall Precaution Comments: trach plugged for therapy Restrictions Weight Bearing Restrictions: No  Vital Signs: Therapy Vitals Temp: 98.1 F (36.7 C) Temp Source: Oral Pulse Rate: 77 Resp: 16 BP: 128/81 mmHg Patient Position (if appropriate): Lying Oxygen Therapy SpO2: 99 % O2 Device: Not Delivered   See Function Navigator for Current Functional Status.  [name] , MS, OTR/L, CLT  08/10/2015, 9:18 AM

## 2015-08-10 NOTE — Progress Notes (Signed)
Subjective/Complaints:  No new issues. No pain. Slept fairly well.  Review of systems negative for shortness of breath, chest pain, nausea vomiting.   Objective: Vital Signs: Blood pressure 128/81, pulse 77, temperature 98.1 F (36.7 C), temperature source Oral, resp. rate 16, weight 102.1 kg (225 lb 1.4 oz), SpO2 99 %. No results found. Results for orders placed or performed during the hospital encounter of 07/24/15 (from the past 72 hour(s))  CBC     Status: Abnormal   Collection Time: 08/08/15 10:23 AM  Result Value Ref Range   WBC 6.0 4.0 - 10.5 K/uL   RBC 4.35 3.87 - 5.11 MIL/uL   Hemoglobin 9.7 (L) 12.0 - 15.0 g/dL   HCT 16.132.0 (L) 09.636.0 - 04.546.0 %   MCV 73.6 (L) 78.0 - 100.0 fL   MCH 22.3 (L) 26.0 - 34.0 pg   MCHC 30.3 30.0 - 36.0 g/dL   RDW 40.918.0 (H) 81.111.5 - 91.415.5 %   Platelets 480 (H) 150 - 400 K/uL     HEENT: trach site closed with scab Cardio: RRR and no murmurs or extra sounds Resp: CTA B/L and unlabored GI: BS positive and non-distended nontender Extremity:  Pulses positive and No Edema Skin:   Other ttrach site CDI, IV site left arm CDI Neuro: Alert/Oriented with good insight and awareness. Cranial Nerve II-XII normal, Normal Sensory, Abnormal Motor 4- /5 bilateral deltoid, biceps, triceps, grip, hip flexor, knee extensor, ankle dorsiflexor and plantar flexor and Abnormal FMC Ataxic/ dec FMC persists Musc/Skel:  Normal Gen. no acute distress   Assessment/Plan: 1. 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 Physiatrist is providing close team supervision and 24 hour management of active medical problems listed below. Physiatrist and rehab team continue to assess barriers to discharge/monitor patient progress toward functional and medical goals. FIM: Function - Bathing Position: Wheelchair/chair at  sink Body parts bathed by patient: Left arm, Right arm, Chest, Abdomen, Right upper leg, Left upper leg, Front perineal area, Buttocks, Right lower leg, Left lower leg, Back Body parts bathed by helper: Back Bathing not applicable: Front perineal area, Buttocks (Completed earlier with nursing per pt) Assist Level: Touching or steadying assistance(Pt > 75%) (min guard/assist for standing)  Function- Upper Body Dressing/Undressing What is the patient wearing?: Pull over shirt/dress, Bra Bra - Perfomed by patient: Hook/unhook bra (pull down sports bra), Thread/unthread left bra strap, Thread/unthread right bra strap Bra - Perfomed by helper: Thread/unthread right bra strap, Thread/unthread left bra strap, Hook/unhook bra (pull down sports bra) Pull over shirt/dress - Perfomed by patient: Thread/unthread left sleeve, Put head through opening, Thread/unthread right sleeve, Pull shirt over trunk Pull over shirt/dress - Perfomed by helper: Thread/unthread right sleeve, Thread/unthread left sleeve, Put head through opening, Pull shirt over trunk Assist Level: Supervision or verbal cues, Set up Set up : To obtain clothing/put away Function - Lower Body Dressing/Undressing Lower body dressing/undressing activity did not occur: N/A (pt wearing dress today) What is the patient wearing?: Socks Position: Wheelchair/chair at sink Pants- Performed by patient: Thread/unthread right pants leg, Thread/unthread left pants leg, Pull pants up/down Pants- Performed by helper: Thread/unthread right pants leg, Thread/unthread left pants leg, Pull pants up/down Non-skid slipper socks- Performed by patient: Don/doff right sock, Don/doff left sock Non-skid slipper socks- Performed by helper: Don/doff left sock Socks - Performed by patient: Don/doff right sock, Don/doff left sock Assist  for footwear: Setup Assist for lower body dressing: Supervision or verbal cues  Function - Toileting Toileting activity did not  occur: No continent bowel/bladder event Toileting steps completed by patient: Adjust clothing prior to toileting, Performs perineal hygiene Toileting steps completed by helper: Adjust clothing after toileting Toileting Assistive Devices: Grab bar or rail Assist level: Touching or steadying assistance (Pt.75%)  Function - Archivist transfer activity did not occur: N/A Toilet transfer assistive device: Grab bar Assist level to toilet: Touching or steadying assistance (Pt > 75%) Assist level from toilet: Maximal assist (Pt 25 - 49%/lift and lower)  Function - Chair/bed transfer Chair/bed transfer method: Ambulatory Chair/bed transfer assist level: Touching or steadying assistance (Pt > 75%) Chair/bed transfer assistive device: Armrests, Walker Mechanical lift: Stedy Chair/bed transfer details: Manual facilitation for placement, Manual facilitation for weight bearing, Verbal cues for technique, Manual facilitation for weight shifting  Function - Locomotion: Wheelchair Will patient use wheelchair at discharge?: Yes Type: Manual Max wheelchair distance: 50 Assist Level: Supervision or verbal cues Assist Level: Supervision or verbal cues Wheel 150 feet activity did not occur: Safety/medical concerns Assist Level: Supervision or verbal cues Turns around,maneuvers to table,bed, and toilet,negotiates 3% grade,maneuvers on rugs and over doorsills: No Function - Locomotion: Ambulation Ambulation activity did not occur: Safety/medical concerns (unsafe to attempt due to weakness) Assistive device: Walker-rolling Max distance: 25 Assist level: Touching or steadying assistance (Pt > 75%) Assist level: Touching or steadying assistance (Pt > 75%) Walk 50 feet with 2 turns activity did not occur: Safety/medical concerns Assist level: Touching or steadying assistance (Pt > 75%) Walk 150 feet activity did not occur: Safety/medical concerns Walk 10 feet on uneven surfaces activity did  not occur: Safety/medical concerns  Function - Comprehension Comprehension: Auditory Comprehension assist level: Follows complex conversation/direction with extra time/assistive device  Function - Expression Expression: Verbal Expression assistive device: Talk trach valve Expression assist level: Expresses basic 75 - 89% of the time/requires cueing 10 - 24% of the time. Needs helper to occlude trach/needs to repeat words.  Function - Social Interaction Social Interaction assist level: Interacts appropriately 90% of the time - Needs monitoring or encouragement for participation or interaction.  Function - Problem Solving Problem solving assist level: Solves basic 90% of the time/requires cueing < 10% of the time  Function - Memory Memory assist level: Recognizes or recalls 90% of the time/requires cueing < 10% of the time Patient normally able to recall (first 3 days only): Current season   Medical Problem List and Plan: 1.  Functional deficits, right hemiparesis and general debility secondary to hypertensive left parietal and pontine hemorrhages with subsequent complications,in addition has some left hemiparesis  Related to Right frontoparietal subacute infarcts  - Cont CIR PT, OT SLP  2.  DVT Prophylaxis/Anticoagulation: Pharmaceutical: Lovenox   daily 3. Pain Management:    low dose ultram prn effective  -pain generally muscle soreness at this point   4. Mood: team to provide ego support. LCSW to follow for evaluation.   5. Neuropsych: This patient is not capable of making decisions on her own behalf. 6. Skin/Wound Care:   Maintain adequate nutrition and hydration.   7. Fluids/Electrolytes/Nutrition: Monitor I/O on current diet.  Check lytes in am. 8. VDRF  now decannulated 9. HTN: Continue norvasc, lisinopril, apresoline, metoprolol and HCTZ.   reduced HCTZ  Filed Vitals:   08/09/15 2141 08/10/15 0525  BP: 114/70 128/81  Pulse:  77  Temp:  98.1 F (36.7 C)  Resp:  16    10 ARF- improving off bactrim and lisinopril    -recheck next week BMP Latest Ref Rng 08/07/2015 08/05/2015 08/04/2015  Glucose 65 - 99 mg/dL 89 91 94  BUN 6 - 20 mg/dL 16(X21(H) 09(U31(H) 04(V40(H)  Creatinine 0.44 - 1.00 mg/dL 4.09(W1.45(H) 1.19(J1.28(H) 4.78(G1.67(H)  Sodium 135 - 145 mmol/L 137 135 131(L)  Potassium 3.5 - 5.1 mmol/L 3.7 4.0 4.0  Chloride 101 - 111 mmol/L 103 105 101  CO2 22 - 32 mmol/L 23 22 22   Calcium 8.9 - 10.3 mg/dL 95.610.2 21.310.1 9.9    11. ABLA: Monitor for signs of bleeding. Recheck CBC  In am  CBC Latest Ref Rng 08/08/2015 07/25/2015 07/24/2015  WBC 4.0 - 10.5 K/uL 6.0 9.0 8.7  Hemoglobin 12.0 - 15.0 g/dL 0.8(M9.7(L) 5.7(Q9.4(L) 4.6(N9.4(L)  Hematocrit 36.0 - 46.0 % 32.0(L) 31.3(L) 31.1(L)  Platelets 150 - 400 K/uL 480(H) 412(H) 387      12. Dysphagia: Monitor tolerance of dysphagia 2,thins, 75% meals 13. Abnormal LFT:  basically resolved  14. Anxiety: Monitor on buspar bid.  15. Diarrhea: improved , daily BMs, no incont  16.  Hypoalbuminemia:  Prostat LOS (Days) 17 A FACE TO FACE EVALUATION WAS PERFORMED  Maloni Musleh T 08/10/2015, 9:02 AM

## 2015-08-10 NOTE — Progress Notes (Signed)
Physical Therapy Session Note  Patient Details  Name: [name] MRN: [id number] Date of Birth: 10-28-1965  Today's Date: 08/10/2015 PT Individual Time: 1003-1104 PT Individual Time Calculation (min): 61 min   Short Term Goals: Week 3:  PT Short Term Goal 1 (Week 3): pt will gait in controlled environment 100' with min A PT Short Term Goal 2 (Week 3): Pt will perform bed mobility with min A PT Short Term Goal 3 (Week 3): Pt will negotiate 4 steps min A with 2 handrails  Skilled Therapeutic Interventions/Progress Updates:   Pt received in w/c; pt denies pain but reports continued fatigue in the am and difficulty getting her "leg going."  Discussed having a supine HEP to get blood flowing and muscles warmed up before getting OOB.  Pt performed w/c mobility x 150' with supervision and extra time for UE strengthening, coordination and endurance.  Pt performed stand pivot transfers w/c > Nustep with RW and min-mod A.  Pt performed bilat UE and LE strengthening, coordination and endurance on Nustep x 7 minutes at level 6 resistance + 3 minutes with LE only for extensor strengthening.  BP after Nustep: 128/81, 97% and HR: 72 bpm.  Pt performed ambulation Nustep > mat x 15' with RW and min A with verbal cues for safe sequencing with RW when pivoting, sidestepping and retro stepping.  Performed sit > supine on flat mat with supervision.  Provided pt with hand out of LE strengthening and coordination exercises to perform in supine; reviewed all 4 exercises with pt return demonstrating 10 reps each: heel slides, hip ABD, lumbar rotation and bridging.  Performed supine > sit with supervision from flat mat.  Performed dynamic balance training in standing with just L HHA with focus on lateral, anterior/posterior weight shifting and alternating LE stepping forwards and back for increased activation of trunk and proximal hip stabilization muscles and decreased dependence on UE during standing/gait.  Pt only able  to slide feet forwards and unable to lift completely off the floor; pt fatigued quickly.  Pt ambulated with RW and min A back to w/c.  Pt left in w/c in room with all items with in reach.    Therapy Documentation Precautions:  Precautions Precautions: Fall Precaution Comments: trach plugged for therapy Restrictions Weight Bearing Restrictions: No Pain: Pain Assessment Pain Assessment: 0-10 Pain Score: 2  Pain Location: Leg Pain Orientation: Right;Left Pain Descriptors / Indicators: Aching Pain Intervention(s): Refused  See Function Navigator for Current Functional Status.   Therapy/Group: Individual Therapy  Edman CircleHall, Lynne Takemoto Faucette 08/10/2015, 11:30 AM

## 2015-08-11 ENCOUNTER — Inpatient Hospital Stay (HOSPITAL_COMMUNITY): Payer: Self-pay | Admitting: *Deleted

## 2015-08-11 DIAGNOSIS — I611 Nontraumatic intracerebral hemorrhage in hemisphere, cortical: Secondary | ICD-10-CM

## 2015-08-11 NOTE — Progress Notes (Signed)
Subjective/Complaints:  Up in chair. Feels well. Soreness gone  Review of systems negative for shortness of breath, chest pain, nausea vomiting.   Objective: Vital Signs: Blood pressure 121/73, pulse 79, temperature 98.1 F (36.7 C), temperature source Oral, resp. rate 18, weight 101.9 kg (224 lb 10.4 oz), SpO2 98 %. No results found. Results for orders placed or performed during the hospital encounter of 07/24/15 (from the past 72 hour(s))  CBC     Status: Abnormal   Collection Time: 08/08/15 10:23 AM  Result Value Ref Range   WBC 6.0 4.0 - 10.5 K/uL   RBC 4.35 3.87 - 5.11 MIL/uL   Hemoglobin 9.7 (L) 12.0 - 15.0 g/dL   HCT 16.132.0 (L) 09.636.0 - 04.546.0 %   MCV 73.6 (L) 78.0 - 100.0 fL   MCH 22.3 (L) 26.0 - 34.0 pg   MCHC 30.3 30.0 - 36.0 g/dL   RDW 40.918.0 (H) 81.111.5 - 91.415.5 %   Platelets 480 (H) 150 - 400 K/uL     HEENT: trach site closed with scab. Voice still hoarse Cardio: RRR and no murmurs or extra sounds Resp: CTA B/L and unlabored GI: BS positive and non-distended nontender Extremity:  Pulses positive and No Edema Skin:   Other ttrach site CDI, IV site left arm CDI Neuro: Alert/Oriented with good insight and awareness. Cranial Nerve II-XII normal, Normal Sensory, Abnormal Motor 4- /5 bilateral deltoid, biceps, triceps, grip, hip flexor, knee extensor, ankle dorsiflexor and plantar flexor and Abnormal FMC Ataxic/ dec FMC persists Musc/Skel:  Normal Gen. no acute distress   Assessment/Plan: 1. 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 Physiatrist is providing close team supervision and 24 hour management of active medical problems listed below. Physiatrist and rehab team continue to assess barriers to discharge/monitor patient progress toward functional and medical goals. FIM: Function - Bathing Position:  Shower Body parts bathed by patient: Left arm, Right arm, Chest, Abdomen, Right upper leg, Left upper leg, Front perineal area, Buttocks, Right lower leg, Left lower leg, Back Body parts bathed by helper: Back Bathing not applicable: Front perineal area, Buttocks (Completed earlier with nursing per pt) Assist Level: Supervision or verbal cues, Set up Set up : To obtain items  Function- Upper Body Dressing/Undressing What is the patient wearing?: Pull over shirt/dress, Bra Bra - Perfomed by patient: Hook/unhook bra (pull down sports bra), Thread/unthread left bra strap, Thread/unthread right bra strap Bra - Perfomed by helper: Thread/unthread right bra strap, Thread/unthread left bra strap, Hook/unhook bra (pull down sports bra) Pull over shirt/dress - Perfomed by patient: Thread/unthread left sleeve, Put head through opening, Thread/unthread right sleeve, Pull shirt over trunk Pull over shirt/dress - Perfomed by helper: Thread/unthread right sleeve, Thread/unthread left sleeve, Put head through opening, Pull shirt over trunk Assist Level: Supervision or verbal cues, Set up Set up : To obtain clothing/put away Function - Lower Body Dressing/Undressing Lower body dressing/undressing activity did not occur: N/A (pt wearing dress today) What is the patient wearing?: Pants, Socks, Shoes Position: Other (comment) (sit to/from stand from tub transfer bench) Pants- Performed by patient: Thread/unthread right pants leg, Thread/unthread left pants leg, Pull pants up/down Pants- Performed by helper: Thread/unthread right pants leg, Thread/unthread left pants leg, Pull pants up/down Non-skid slipper socks- Performed by patient: Don/doff right sock, Don/doff left sock Non-skid slipper socks- Performed by helper: Don/doff left sock Socks -  Performed by patient: Don/doff right sock, Don/doff left sock Shoes - Performed by patient: Don/doff right shoe, Don/doff left shoe, Fasten right, Fasten left Assist  for footwear: Setup Assist for lower body dressing: Supervision or verbal cues, Touching or steadying assistance (Pt > 75%) (min guard/assist for sit to/from stands and dynamic standing during clothing management)  Function - Toileting Toileting activity did not occur: No continent bowel/bladder event Toileting steps completed by patient: Adjust clothing prior to toileting, Performs perineal hygiene Toileting steps completed by helper: Adjust clothing after toileting Toileting Assistive Devices: Grab bar or rail Assist level: Touching or steadying assistance (Pt.75%)  Function - ArchivistToilet Transfers Toilet transfer activity did not occur: N/A Toilet transfer assistive device: Grab bar Assist level to toilet: Touching or steadying assistance (Pt > 75%) Assist level from toilet: Maximal assist (Pt 25 - 49%/lift and lower)  Function - Chair/bed transfer Chair/bed transfer method: Stand pivot, Ambulatory Chair/bed transfer assist level: Touching or steadying assistance (Pt > 75%) Chair/bed transfer assistive device: Mechanical lift Mechanical lift: Stedy Chair/bed transfer details: Manual facilitation for placement, Manual facilitation for weight bearing, Verbal cues for technique, Manual facilitation for weight shifting  Function - Locomotion: Wheelchair Will patient use wheelchair at discharge?: Yes Type: Manual Max wheelchair distance: 150 Assist Level: Supervision or verbal cues Assist Level: Supervision or verbal cues Wheel 150 feet activity did not occur: Safety/medical concerns Assist Level: Supervision or verbal cues Turns around,maneuvers to table,bed, and toilet,negotiates 3% grade,maneuvers on rugs and over doorsills: No Function - Locomotion: Ambulation Ambulation activity did not occur: Safety/medical concerns (unsafe to attempt due to weakness) Assistive device: Walker-rolling Max distance: 25 Assist level: Touching or steadying assistance (Pt > 75%) Assist level: Touching  or steadying assistance (Pt > 75%) Walk 50 feet with 2 turns activity did not occur: Safety/medical concerns Assist level: Touching or steadying assistance (Pt > 75%) Walk 150 feet activity did not occur: Safety/medical concerns Walk 10 feet on uneven surfaces activity did not occur: Safety/medical concerns  Function - Comprehension Comprehension: Auditory Comprehension assist level: Follows complex conversation/direction with extra time/assistive device  Function - Expression Expression: Verbal Expression assistive device: Talk trach valve Expression assist level: Expresses basic 90% of the time/requires cueing < 10% of the time.  Function - Social Interaction Social Interaction assist level: Interacts appropriately 90% of the time - Needs monitoring or encouragement for participation or interaction.  Function - Problem Solving Problem solving assist level: Solves basic 90% of the time/requires cueing < 10% of the time  Function - Memory Memory assist level: Recognizes or recalls 75 - 89% of the time/requires cueing 10 - 24% of the time Patient normally able to recall (first 3 days only): Current season   Medical Problem List and Plan: 1.  Functional deficits, right hemiparesis and general debility secondary to hypertensive left parietal and pontine hemorrhages with subsequent complications,in addition has some left hemiparesis  Related to Right frontoparietal subacute infarcts  - Cont CIR PT, OT SLP  2.  DVT Prophylaxis/Anticoagulation: Pharmaceutical: Lovenox  30mg  daily 3. Pain Management:    low dose ultram prn effective  -pain generally muscle soreness at this point which has improved also 4. Mood: team to provide ego support. LCSW to follow for evaluation.   5. Neuropsych: This patient is not capable of making decisions on her own behalf. 6. Skin/Wound Care:   Maintain adequate nutrition and hydration.   7. Fluids/Electrolytes/Nutrition: Monitor I/O on current diet.  Check  lytes in am. 8. VDRF  now  decannulated. Discussed vocal quality. Given her current volume and voice quality, I expect further improvement. May need outpt ENT 9. HTN: Continue norvasc, lisinopril, apresoline, metoprolol and HCTZ.   reduced HCTZ  Filed Vitals:   08/11/15 0519 08/11/15 0909  BP: 116/71 121/73  Pulse: 88 79  Temp: 98.1 F (36.7 C)   Resp: 18    10 ARF- improving off bactrim and lisinopril    -recheck monday BMP Latest Ref Rng 08/07/2015 08/05/2015 08/04/2015  Glucose 65 - 99 mg/dL 89 91 94  BUN 6 - 20 mg/dL 16(X) 09(U) 04(V)  Creatinine 0.44 - 1.00 mg/dL 4.09(W) 1.19(J) 4.78(G)  Sodium 135 - 145 mmol/L 137 135 131(L)  Potassium 3.5 - 5.1 mmol/L 3.7 4.0 4.0  Chloride 101 - 111 mmol/L 103 105 101  CO2 22 - 32 mmol/L Calcium 8.9 - 10.3 mg/dL 95.6 21.3 9.9    11. ABLA: Monitor for signs of bleeding. Recheck CBC Monday  CBC Latest Ref Rng 08/08/2015 07/25/2015 07/24/2015  WBC 4.0 - 10.5 K/uL 6.0 9.0 8.7  Hemoglobin 12.0 - 15.0 g/dL 0.8(M) 5.7(Q) 4.6(N)  Hematocrit 36.0 - 46.0 % 32.0(L) 31.3(L) 31.1(L)  Platelets 150 - 400 K/uL 480(H) 412(H) 387      12. Dysphagia: Monitor tolerance of dysphagia 2,thins, 75% meals 13. Abnormal LFT:  basically resolved  14. Anxiety: Monitor on buspar bid.  15. Diarrhea: improved , daily BMs, no incont  16.  Hypoalbuminemia:  Prostat LOS (Days) 18 A FACE TO FACE EVALUATION WAS PERFORMED  Evianna Chandran T 08/11/2015, 9:26 AM

## 2015-08-11 NOTE — Progress Notes (Signed)
Physical Therapy Session Note  Patient Details  Name: Gwendolyn Morales MRN: [id number] Date of Birth: [birth year]  Today's Date: 08/11/2015 PT Individual Time: 0756-0900 PT Individual Time Calculation (min): 64 min    Skilled Therapeutic Interventions/Progress Updates:  Patient in room , ready and agreeable to therapy, no c/o pain or discomfort. Session initiated with w/c mobility to therapy gym, cues for hand placement in order to increase speed.  Transfer via stand pivot to NuStep with min A- activity on NuStep x 10 min with resistance of 5 in order to increase strength, facilitate reciprocal movements and coordination.  Therapeutic exercises in accordance to OTAGO A protocol with 3 lbs on B ankles in sitting for LAQ, and standing for hip abd and hip extension. All exercises performed 2 x 10 , rest break provided as needed.  Balance also introduced and performed , including sit to stand, tandem stepping and weight shifting.  Training in balance and coordination in sit to stand w/o use of UE 2 x 3 with max to mod A for initial standing, 1/2 standing with weight shifts to R and L . Training and guidance in short distance gait with focus on increasing weight bearing through LE and reducing UE support, cues for step length and width needed.  Patient fatigues easily and needs cues for appropriate breathing techniques,  At the end of session returned to room, left with all needs within reach.    Therapy Documentation Precautions:  Precautions Precautions: Fall Precaution Comments: trach plugged for therapy Restrictions Weight Bearing Restrictions: No   See Function Navigator for Current Functional Status.   Therapy/Group: Individual Therapy  Dorna MaiCzajkowska, Celeste Tavenner W 08/11/2015, 10:08 AM

## 2015-08-12 ENCOUNTER — Inpatient Hospital Stay (HOSPITAL_COMMUNITY): Payer: Self-pay | Admitting: Physical Therapy

## 2015-08-12 NOTE — Progress Notes (Signed)
Physical Therapy Session Note  Patient Details  Name: Gwendolyn SimonSherry Lombardozzi MRN: [id number] Date of Birth: 07/11/1965  Today's Date: 08/12/2015 PT Individual Time: [id number] PT Individual Time Calculation (min): 62 min   Short Term Goals: Week 3:  PT Short Term Goal 1 (Week 3): pt will gait in controlled environment 100' with min A PT Short Term Goal 2 (Week 3): Pt will perform bed mobility with min A PT Short Term Goal 3 (Week 3): Pt will negotiate 4 steps min A with 2 handrails  Skilled Therapeutic Interventions/Progress Updates:    Pt received in w/c & agreeable to PT, denying c/o pain. Pt's husband present for session. Pt self propelled w/c x 150 ft room>gym with BUE & supervision. Stair negotiation completed with pt negotiating 4 steps (6") + 12 steps (6" + 3") with B rails & steady A fading to supervision. Pt negotiated stairs with compensatory, step-to strategy but self selecting to lead with either foot. Gait training x 50 ft + 80 ft + 100 ft + 200 ft with RW & steady A fade to supervision with pt demonstrating step-to gait pattern & decreased step length BLE & gait speed. Pt demonstrates decreased step length & foot clearance during gait. Pt required several seated rest breaks 2/2 fatigue & for energy conservation. Throughout session pt independently demonstrated pursed lip breathing. At end of session pt left in w/c in room with husband present.  Therapy Documentation Precautions:  Precautions Precautions: Fall Precaution Comments: trach plugged for therapy Restrictions Weight Bearing Restrictions: No  Pain: Pain Assessment Pain Assessment: No/denies pain   See Function Navigator for Current Functional Status.   Therapy/Group: Individual Therapy  Sandi MariscalVictoria M Vincenzo Stave 08/12/2015, 5:10 PM

## 2015-08-12 NOTE — Progress Notes (Signed)
Subjective/Complaints:  No new complaints other than a stuffy nose.   Review of systems negative for shortness of breath, chest pain, nausea vomiting.   Objective: Vital Signs: Blood pressure 137/66, pulse 73, temperature 98.3 F (36.8 C), temperature source Oral, resp. rate 17, weight 104.4 kg (230 lb 2.6 oz), SpO2 99 %. No results found. No results found for this or any previous visit (from the past 72 hour(s)).   HEENT: trach site closed with scab. Voice still hoarse Cardio: RRR and no murmurs or extra sounds Resp: CTA B/L and unlabored GI: BS positive and non-distended nontender Extremity:  Pulses positive and No Edema Skin:   Other ttrach site CDI, IV site left arm CDI Neuro: Alert/Oriented with good insight and awareness. Cranial Nerve II-XII normal, Normal Sensory, Abnormal Motor 4- /5 bilateral deltoid, biceps, triceps, grip, hip flexor, knee extensor, ankle dorsiflexor and plantar flexor and Abnormal FMC Ataxic/ dec FMC persists. Good sitting balance Musc/Skel:  Normal Gen. no acute distress   Assessment/Plan: 1. 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 Physiatrist is providing close team supervision and 24 hour management of active medical problems listed below. Physiatrist and rehab team continue to assess barriers to discharge/monitor patient progress toward functional and medical goals. FIM: Function - Bathing Position: Shower Body parts bathed by patient: Left arm, Right arm, Chest, Abdomen, Right upper leg, Left upper leg, Front perineal area, Buttocks, Right lower leg, Left lower leg, Back Body parts bathed by helper: Back Bathing not applicable: Front perineal area, Buttocks (Completed earlier with nursing per pt) Assist Level: Supervision or verbal cues, Set up Set up : To obtain  items  Function- Upper Body Dressing/Undressing What is the patient wearing?: Pull over shirt/dress, Bra Bra - Perfomed by patient: Hook/unhook bra (pull down sports bra), Thread/unthread left bra strap, Thread/unthread right bra strap Bra - Perfomed by helper: Thread/unthread right bra strap, Thread/unthread left bra strap, Hook/unhook bra (pull down sports bra) Pull over shirt/dress - Perfomed by patient: Thread/unthread left sleeve, Put head through opening, Thread/unthread right sleeve, Pull shirt over trunk Pull over shirt/dress - Perfomed by helper: Thread/unthread right sleeve, Thread/unthread left sleeve, Put head through opening, Pull shirt over trunk Assist Level: Supervision or verbal cues, Set up Set up : To obtain clothing/put away Function - Lower Body Dressing/Undressing Lower body dressing/undressing activity did not occur: N/A (pt wearing dress today) What is the patient wearing?: Pants, Socks, Shoes Position: Other (comment) (sit to/from stand from tub transfer bench) Pants- Performed by patient: Thread/unthread right pants leg, Thread/unthread left pants leg, Pull pants up/down Pants- Performed by helper: Thread/unthread right pants leg, Thread/unthread left pants leg, Pull pants up/down Non-skid slipper socks- Performed by patient: Don/doff right sock, Don/doff left sock Non-skid slipper socks- Performed by helper: Don/doff left sock Socks - Performed by patient: Don/doff right sock, Don/doff left sock Shoes - Performed by patient: Don/doff right shoe, Don/doff left shoe, Fasten right, Fasten left Assist for footwear: Setup Assist for lower body dressing: Supervision or verbal cues, Touching or steadying assistance (Pt > 75%) (min guard/assist for sit to/from stands and dynamic standing during clothing management)  Function - Toileting Toileting activity did not occur: No continent bowel/bladder event Toileting steps completed by patient: Adjust clothing prior to  toileting, Performs perineal hygiene, Adjust clothing after toileting Toileting steps completed by helper: Adjust clothing prior to toileting, Performs  perineal hygiene, Adjust clothing after toileting Toileting Assistive Devices: Grab bar or rail Assist level: Touching or steadying assistance (Pt.75%)  Function - Toilet Transfers Toilet transfer activity did not occur: N/A Toilet transfer assistive device: Grab bar Assist level to toilet: Moderate assist (Pt 50 - 74%/lift or lower) Assist level from toilet: Moderate assist (Pt 50 - 74%/lift or lower)  Function - Chair/bed transfer Chair/bed transfer method: Stand pivot, Ambulatory Chair/bed transfer assist level: Touching or steadying assistance (Pt > 75%) Chair/bed transfer assistive device: Mechanical lift Mechanical lift: Stedy Chair/bed transfer details: Manual facilitation for placement, Manual facilitation for weight bearing, Verbal cues for technique, Manual facilitation for weight shifting  Function - Locomotion: Wheelchair Will patient use wheelchair at discharge?: Yes Type: Manual Max wheelchair distance: 150 Assist Level: Supervision or verbal cues Assist Level: Supervision or verbal cues Wheel 150 feet activity did not occur: Safety/medical concerns Assist Level: Supervision or verbal cues Turns around,maneuvers to table,bed, and toilet,negotiates 3% grade,maneuvers on rugs and over doorsills: No Function - Locomotion: Ambulation Ambulation activity did not occur: Safety/medical concerns (unsafe to attempt due to weakness) Assistive device: Walker-rolling Max distance: 25 Assist level: Touching or steadying assistance (Pt > 75%) Assist level: Touching or steadying assistance (Pt > 75%) Walk 50 feet with 2 turns activity did not occur: Safety/medical concerns Assist level: Touching or steadying assistance (Pt > 75%) Walk 150 feet activity did not occur: Safety/medical concerns Walk 10 feet on uneven surfaces activity  did not occur: Safety/medical concerns  Function - Comprehension Comprehension: Auditory Comprehension assist level: Follows complex conversation/direction with no assist  Function - Expression Expression: Verbal Expression assistive device: Talk trach valve Expression assist level: Expresses complex ideas: With no assist  Function - Social Interaction Social Interaction assist level: Interacts appropriately with others - No medications needed.  Function - Problem Solving Problem solving assist level: Solves complex problems: Recognizes & self-corrects  Function - Memory Memory assist level: Complete Independence: No helper Patient normally able to recall (first 3 days only): Current season   Medical Problem List and Plan: 1.  Functional deficits, right hemiparesis and general debility secondary to hypertensive left parietal and pontine hemorrhages with subsequent complications,in addition has some left hemiparesis  Related to Right frontoparietal subacute infarcts  - Cont CIR PT, OT SLP  -pt remains motivated 2.  DVT Prophylaxis/Anticoagulation: Pharmaceutical: Lovenox   daily indicated 3. Pain Management:    low dose ultram prn effective  -pain generally muscle soreness at this point which has improved also 4. Mood: team to provide ego support. LCSW to follow for evaluation.   5. Neuropsych: This patient is not capable of making decisions on her own behalf. 6. Skin/Wound Care:   Maintain adequate nutrition and hydration.   7. Fluids/Electrolytes/Nutrition: Monitor I/O on current diet.  Check lytes in am. 8. VDRF--resolved. Discussed vocal quality. Given her current volume and voice quality, I expect further improvement. May need outpt ENT assessment 9. HTN: Continue norvasc, lisinopril, apresoline, metoprolol and HCTZ.   reduced HCTZ  Filed Vitals:   08/12/15 0741 08/12/15 0742  BP: 137/66   Pulse:  73  Temp:    Resp:     10 ARF- improving off bactrim and lisinopril     -recheck monday BMP Latest Ref Rng 08/07/2015 08/05/2015 08/04/2015  Glucose 65 - 99 mg/dL 89 91 94  BUN 6 - 20 mg/dL 19(J) 47(W) 29(F)  Creatinine 0.44 - 1.00 mg/dL 6.21(H) 0.86(V) 7.84(O)  Sodium 135 - 145 mmol/L 137 135 131(L)  Potassium 3.5 - 5.1 mmol/L 3.7 4.0 4.0  Chloride 101 - 111 mmol/L 103 105 101  CO2 22 - 32 mmol/L 23 22 22   Calcium 8.9 - 10.3 mg/dL 40.910.2 81.110.1 9.9    11. ABLA: Monitor for signs of bleeding. Recheck CBC Monday  CBC Latest Ref Rng 08/08/2015 07/25/2015 07/24/2015  WBC 4.0 - 10.5 K/uL 6.0 9.0 8.7  Hemoglobin 12.0 - 15.0 g/dL 9.1(Y9.7(L) 7.8(G9.4(L) 9.5(A9.4(L)  Hematocrit 36.0 - 46.0 % 32.0(L) 31.3(L) 31.1(L)  Platelets 150 - 400 K/uL 480(H) 412(H) 387      12. Dysphagia: Monitor tolerance of dysphagia 2,thins, 75% meals 13. Abnormal LFT:  basically resolved  14. Anxiety: Monitor on buspar bid.  15. Diarrhea: improved , daily BMs, no incont  16.  Hypoalbuminemia:  Prostat LOS (Days) 19 A FACE TO FACE EVALUATION WAS PERFORMED  SWARTZ,ZACHARY T 08/12/2015, 8:46 AM

## 2015-08-13 ENCOUNTER — Inpatient Hospital Stay (HOSPITAL_COMMUNITY): Payer: Self-pay | Admitting: Speech Pathology

## 2015-08-13 ENCOUNTER — Inpatient Hospital Stay (HOSPITAL_COMMUNITY): Payer: Self-pay

## 2015-08-13 ENCOUNTER — Inpatient Hospital Stay (HOSPITAL_COMMUNITY): Payer: Self-pay | Admitting: Occupational Therapy

## 2015-08-13 LAB — BASIC METABOLIC PANEL
ANION GAP: 8 (ref 5–15)
BUN: 18 mg/dL (ref 6–20)
CHLORIDE: 103 mmol/L (ref 101–111)
CO2: 26 mmol/L (ref 22–32)
Calcium: 9.9 mg/dL (ref 8.9–10.3)
Creatinine, Ser: 1.3 mg/dL — ABNORMAL HIGH (ref 0.44–1.00)
GFR calc Af Amer: 55 mL/min — ABNORMAL LOW (ref >60)
GFR, EST NON AFRICAN AMERICAN: 47 mL/min — AB (ref >60)
GLUCOSE: 97 mg/dL (ref 65–99)
POTASSIUM: 3.3 mmol/L — AB (ref 3.5–5.1)
Sodium: 137 mmol/L (ref 135–145)

## 2015-08-13 LAB — CBC
HEMATOCRIT: 31.2 % — AB (ref 36.0–46.0)
HEMOGLOBIN: 9.4 g/dL — AB (ref 12.0–15.0)
MCH: 22.8 pg — AB (ref 26.0–34.0)
MCHC: 30.1 g/dL (ref 30.0–36.0)
MCV: 75.5 fL — AB (ref 78.0–100.0)
PLATELETS: 290 10*3/uL (ref 150–400)
RBC: 4.13 MIL/uL (ref 3.87–5.11)
RDW: 17.8 % — ABNORMAL HIGH (ref 11.5–15.5)
WBC: 5 10*3/uL (ref 4.0–10.5)

## 2015-08-13 NOTE — Progress Notes (Signed)
Occupational Therapy Session Note  Patient Details  Name: Gwendolyn SimonSherry Morales MRN: [id number] Date of Birth: [birth year]  Today's Date: 08/13/2015 OT Individual Time: 1410-1504 OT Individual Time Calculation (min): 54 min    Short Term Goals: Week 3:  OT Short Term Goal 1 (Week 3): STGs = LTGs  Skilled Therapeutic Interventions/Progress Updates:    Treatment session with focus on gross and fine motor skills for improved occupational performance. Pt in WC in room. [facility] < > mat table transfer at sit to stand with CGA. Engaged pt in activity which incorporated PNF diagonals with RUE to improve proximal limb strength. Fine motor activity included using tripod and pad-to-pad grasps to address pt goal of overall improved manual strength and dexterity. Issued HEP activity to pt with instructions to use and grade for objectives such as dexterity, strength, AROM, and in-hand manipulation. Pt verbalized understanding and enjoyment of activities. Handed off to PT in therapy gym.   Therapy Documentation Precautions:  Precautions Precautions: Fall Precaution Comments: trach plugged for therapy Restrictions Weight Bearing Restrictions: No General:   Vital Signs: Therapy Vitals Temp: 97.7 F (36.5 C) Temp Source: Oral Pulse Rate: 74 Resp: 18 BP: 129/78 mmHg Patient Position (if appropriate): Sitting Oxygen Therapy SpO2: 100 % O2 Device: Not Delivered Pain: Pain Assessment Pain Assessment: No/denies pain  See Function Navigator for Current Functional Status.   Therapy/Group: Individual Therapy  [name] 08/13/2015, 3:13 PM

## 2015-08-13 NOTE — Progress Notes (Signed)
Physical Therapy Note  Patient Details  Name: Gwendolyn Morales MRN: [id number] Date of Birth: [birth year] Today's Date: 08/13/2015  1005-1035, 30 min; 1505-1605, 60 min individual tx Pain: none reported  Tx 1:  neuromuscular re-education seated in w/c for: 20 x 1 marching; heel/toe raises, bil hip adduction. Reviewed diaphragmatic breathing during rest between sets of exs, and counting aloud when exerting during ex to prevent Valsalva.  Gait with RW on level tile x 18' including 4 turns R; min guard/ Supervision.  Pt progressed with fluidity of gait by end of ambulation.   Therapeutic activity in sitting with hands on low bench in front, working on elevating hips with appropriate forward wt shift and head down , x 10.    PT instructed pt to propel herself to gym for Physical Therapy sessions; safety plan updated.   tx 2:  With Recreational Therapist, pt kicked soccer ball while standing in parallel bars; neither knee buckled, and knee flexion improved with cues and repetition to bring feet up behind pt and judging speed of ball for straight kicks.  Standing balance activity: standing without UE support, reaching out of BOS across midline and placing matching cards on vertical surface in front of her.  Blocked practice for forward wt shift for sit>< stand.  Squat>< stand x 16 to place small object from low stool to high table in front, without knees buckling.  Stand> sit onto 21'' high mat with bil hands on knees with improved eccentric control.  Diaphragmatic breathing improved after practice. Gait on level tile x 50'.  Up/down 12 steps 2 rails with min guard, max cues for step -through on 3" high steps and step -to on 6" high steps . neuromuscular re-education via demo, VCS for alternating reciprocal LE movements in sitting- heel slides on floor, and bil heels up/toes up. Pt quite flushed; see vitals and RN informed. Pt left resting in w/c with all needs within reach.   [name] 08/13/2015,  8:00 AM

## 2015-08-13 NOTE — Progress Notes (Signed)
Subjective/Complaints:  No new issues. Excited about getting home  Review of systems negative for shortness of breath, chest pain, nausea vomiting.   Objective: Vital Signs: Blood pressure 138/78, pulse 72, temperature 98.2 F (36.8 C), temperature source Oral, resp. rate 18, weight 105.2 kg (231 lb 14.8 oz), SpO2 95 %. No results found. Results for orders placed or performed during the hospital encounter of 07/24/15 (from the past 72 hour(s))  CBC     Status: Abnormal   Collection Time: 08/13/15  5:38 AM  Result Value Ref Range   WBC 5.0 4.0 - 10.5 K/uL   RBC 4.13 3.87 - 5.11 MIL/uL   Hemoglobin 9.4 (L) 12.0 - 15.0 g/dL   HCT 31.2 (L) 36.0 - 46.0 %   MCV 75.5 (L) 78.0 - 100.0 fL   MCH 22.8 (L) 26.0 - 34.0 pg   MCHC 30.1 30.0 - 36.0 g/dL   RDW 17.8 (H) 11.5 - 15.5 %   Platelets 290 150 - 400 K/uL  Basic metabolic panel     Status: Abnormal   Collection Time: 08/13/15  5:38 AM  Result Value Ref Range   Sodium 137 135 - 145 mmol/L   Potassium 3.3 (L) 3.5 - 5.1 mmol/L   Chloride 103 101 - 111 mmol/L   CO2 26 22 - 32 mmol/L   Glucose, Bld 97 65 - 99 mg/dL   BUN 18 6 - 20 mg/dL   Creatinine, Ser 1.30 (H) 0.44 - 1.00 mg/dL   Calcium 9.9 8.9 - 10.3 mg/dL   GFR calc non Af Amer 47 (L) >60 mL/min   GFR calc Af Amer 55 (L) >60 mL/min    Comment: (NOTE) The eGFR has been calculated using the CKD EPI equation. This calculation has not been validated in all clinical situations. eGFR's persistently <60 mL/min signify possible Chronic Kidney Disease.    Anion gap 8 5 - 15     HEENT: trach site closed with scab. Voice still hoarse Cardio: RRR and no murmurs or extra sounds Resp: CTA B/L and unlabored GI: BS positive and non-distended nontender Extremity:  Pulses positive and No Edema Skin:   Other ttrach site CDI, IV site left arm CDI Neuro: Alert/Oriented with good insight and awareness. Cranial Nerve II-XII normal, Normal Sensory, Abnormal Motor 4- /5 bilateral deltoid,  biceps, triceps, grip, hip flexor, knee extensor, ankle dorsiflexor and plantar flexor and Abnormal FMC Ataxic/ dec FMC persists. Good sitting balance Musc/Skel:  Normal Gen. no acute distress   Assessment/Plan: 1. 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 Physiatrist is providing close team supervision and 24 hour management of active medical problems listed below. Physiatrist and rehab team continue to assess barriers to discharge/monitor patient progress toward functional and medical goals. FIM: Function - Bathing Position: Shower Body parts bathed by patient: Left arm, Right arm, Chest, Abdomen, Right upper leg, Left upper leg, Front perineal area, Buttocks, Right lower leg, Left lower leg, Back Body parts bathed by helper: Back Bathing not applicable: Front perineal area, Buttocks (Completed earlier with nursing per pt) Assist Level: Supervision or verbal cues, Set up Set up : To obtain items  Function- Upper Body Dressing/Undressing What is the patient wearing?: Pull over shirt/dress, Bra Bra - Perfomed by patient: Hook/unhook bra (pull down sports bra), Thread/unthread left bra strap, Thread/unthread right bra strap Bra - Perfomed by helper: Thread/unthread  right bra strap, Thread/unthread left bra strap, Hook/unhook bra (pull down sports bra) Pull over shirt/dress - Perfomed by patient: Thread/unthread left sleeve, Put head through opening, Thread/unthread right sleeve, Pull shirt over trunk Pull over shirt/dress - Perfomed by helper: Thread/unthread right sleeve, Thread/unthread left sleeve, Put head through opening, Pull shirt over trunk Assist Level: Supervision or verbal cues, Set up Set up : To obtain clothing/put away Function - Lower Body Dressing/Undressing Lower body dressing/undressing activity did not  occur: N/A (pt wearing dress today) What is the patient wearing?: Pants, Socks, Shoes Position: Other (comment) (sit to/from stand from tub transfer bench) Pants- Performed by patient: Thread/unthread right pants leg, Thread/unthread left pants leg, Pull pants up/down Pants- Performed by helper: Thread/unthread right pants leg, Thread/unthread left pants leg, Pull pants up/down Non-skid slipper socks- Performed by patient: Don/doff right sock, Don/doff left sock Non-skid slipper socks- Performed by helper: Don/doff left sock Socks - Performed by patient: Don/doff right sock, Don/doff left sock Shoes - Performed by patient: Don/doff right shoe, Don/doff left shoe, Fasten right, Fasten left Assist for footwear: Setup Assist for lower body dressing: Supervision or verbal cues, Touching or steadying assistance (Pt > 75%) (min guard/assist for sit to/from stands and dynamic standing during clothing management)  Function - Toileting Toileting activity did not occur: No continent bowel/bladder event Toileting steps completed by patient: Adjust clothing prior to toileting Toileting steps completed by helper: Performs perineal hygiene, Adjust clothing after toileting Toileting Assistive Devices: Grab bar or rail Assist level: Touching or steadying assistance (Pt.75%)  Function - Air cabin crew transfer activity did not occur: N/A Toilet transfer assistive device: Grab bar Assist level to toilet: Touching or steadying assistance (Pt > 75%) Assist level from toilet: Touching or steadying assistance (Pt > 75%)  Function - Chair/bed transfer Chair/bed transfer method: Stand pivot, Ambulatory Chair/bed transfer assist level: Touching or steadying assistance (Pt > 75%) Chair/bed transfer assistive device: Mechanical lift Mechanical lift: Stedy Chair/bed transfer details: Manual facilitation for placement, Manual facilitation for weight bearing, Verbal cues for technique, Manual facilitation  for weight shifting  Function - Locomotion: Wheelchair Will patient use wheelchair at discharge?: Yes Type: Manual Max wheelchair distance: 150 ft Assist Level: Supervision or verbal cues Assist Level: Supervision or verbal cues Wheel 150 feet activity did not occur: Safety/medical concerns Assist Level: Supervision or verbal cues Turns around,maneuvers to table,bed, and toilet,negotiates 3% grade,maneuvers on rugs and over doorsills: No Function - Locomotion: Ambulation Ambulation activity did not occur: Safety/medical concerns (unsafe to attempt due to weakness) Assistive device: Walker-rolling Max distance: 200 ft Assist level: Supervision or verbal cues Assist level: Supervision or verbal cues Walk 50 feet with 2 turns activity did not occur: Safety/medical concerns Assist level: Supervision or verbal cues Walk 150 feet activity did not occur: Safety/medical concerns Assist level: Supervision or verbal cues Walk 10 feet on uneven surfaces activity did not occur: Safety/medical concerns  Function - Comprehension Comprehension: Auditory Comprehension assist level: Follows complex conversation/direction with no assist  Function - Expression Expression: Verbal Expression assistive device: Talk trach valve Expression assist level: Expresses complex ideas: With no assist  Function - Social Interaction Social Interaction assist level: Interacts appropriately with others - No medications needed.  Function - Problem Solving Problem solving assist level: Solves complex problems: Recognizes & self-corrects  Function - Memory Memory assist level: Complete Independence: No helper Patient normally able to recall (first 3 days only): Current season   Medical Problem List and Plan: 1.  Functional deficits, right  hemiparesis and general debility secondary to hypertensive left parietal and pontine hemorrhages with subsequent complications,in addition has some left hemiparesis  Related  to Right frontoparietal subacute infarcts  - Cont CIR PT, OT SLP 2.  DVT Prophylaxis/Anticoagulation: Pharmaceutical: Lovenox  61m daily indicated 3. Pain Management:    low dose ultram prn effective  -pain generally muscle soreness at this point which has improved also 4. Mood: team to provide ego support. LCSW to follow for evaluation.   5. Neuropsych: This patient is not capable of making decisions on her own behalf. 6. Skin/Wound Care:   Maintain adequate nutrition and hydration.   7. Fluids/Electrolytes/Nutrition: Monitor I/O on current diet.  Check lytes in am. 8. VDRF--resolved. Discussed vocal quality. Given her current volume and voice quality, I expect further improvement. May need outpt ENT assessment 9. HTN: Continue norvasc, lisinopril, apresoline, metoprolol and HCTZ.   reduced HCTZ  Filed Vitals:   08/12/15 2152 08/13/15 0556  BP: 122/64 138/78  Pulse:  72  Temp:  98.2 F (36.8 C)  Resp:  18   10 ARF- improving off bactrim and lisinopril    -labs personally reviewed. Replace potassium BMP Latest Ref Rng 08/13/2015 08/07/2015 08/05/2015  Glucose 65 - 99 mg/dL 97 89 91  BUN 6 - 20 mg/dL 18 21(H) 31(H)  Creatinine 0.44 - 1.00 mg/dL 1.30(H) 1.45(H) 1.28(H)  Sodium 135 - 145 mmol/L 137 137 135  Potassium 3.5 - 5.1 mmol/L 3.3(L) 3.7 4.0  Chloride 101 - 111 mmol/L 103 103 105  CO2 22 - 32 mmol/L 26 23 22   Calcium 8.9 - 10.3 mg/dL 9.9 10.2 10.1    11. ABLA: no clinical signs of bleeding. hgb holding steady CBC Latest Ref Rng 08/13/2015 08/08/2015 07/25/2015  WBC 4.0 - 10.5 K/uL 5.0 6.0 9.0  Hemoglobin 12.0 - 15.0 g/dL 9.4(L) 9.7(L) 9.4(L)  Hematocrit 36.0 - 46.0 % 31.2(L) 32.0(L) 31.3(L)  Platelets 150 - 400 K/uL 290 480(H) 412(H)      12. Dysphagia: Monitor tolerance of dysphagia 2,thins, 75% meals 13. Abnormal LFT:  basically resolved  14. Anxiety: Monitor on buspar bid.  15. Diarrhea: improved , daily BMs, no incont  16.  Hypoalbuminemia:  Prostat   LOS (Days) 20 A  FACE TO FACE EVALUATION WAS PERFORMED  Jayven Naill T 08/13/2015, 8:38 AM

## 2015-08-13 NOTE — Progress Notes (Signed)
Speech Language Pathology Daily Session Note  Patient Details  Name: Gwendolyn SimonSherry Morales MRN: [id number] Date of Birth: 1965-06-21  Today's Date: 08/13/2015 SLP Individual Time: 1103-1200 SLP Individual Time Calculation (min): 57 min  Short Term Goals: Week 3: SLP Short Term Goal 1 (Week 3): Pt will consume regular diet and thin liquids with Mod I for use of swallowing precautions and minimal overt s/s of aspiration  SLP Short Term Goal 2 (Week 3): Pt will be intelligible at the sentence level in 75% of opportunities with supervision cues for pacing, overarticulation, and increased vocal intensity.  SLP Short Term Goal 3 (Week 3): Pt will demonstrate selective attention to basic tasks in a mildly distracting environment for 30 minutes with supervision cues.  SLP Short Term Goal 4 (Week 3): Pt will recall basic, daily information with Mod I for use of external aids SLP Short Term Goal 5 (Week 3): Pt will monitor and correct verbal errors in 75% of opportunities with supervision verbal cues.   Skilled Therapeutic Interventions:  Pt was seen for skilled ST targeting goals for communication and dysphagia.  SLP facilitated the session with a trial snack of regular textures to continue working towards diet progression prior to discharge home.  Pt consumed advanced solids with mod I use of swallowing precautions and increased time.  Pt reported increased effort for chewing regular solids but was able to clear residuals from the oral cavity with extra time and self initiated use of liquid wash.  Recommend that pt remain on dys 3 textures for now but may be upgraded to intermittent supervision during meals.  Therapist facilitated the session with a barrier task targeting speech intelligibility at the sentence level.  Background noise was also utilized during the abovementioned task to increase task challenge and use of intelligibility strategies.  Pt required supervision level cues for overarticulation, increased  vocal intensity, and slow rate to achieve intelligibility at the sentence level.  Pt was returned to room and left in wheelchair with call bell left within reach.  Continue per current plan of care.    Function:  Eating Eating   Modified Consistency Diet: No (trials of regular textures with SLP ) Eating Assist Level: More than reasonable amount of time           Cognition Comprehension Comprehension assist level: Follows complex conversation/direction with no assist  Expression   Expression assist level: Expresses basic 90% of the time/requires cueing < 10% of the time.  Social Interaction Social Interaction assist level: Interacts appropriately 90% of the time - Needs monitoring or encouragement for participation or interaction.  Problem Solving Problem solving assist level: Solves basic 75 - 89% of the time/requires cueing 10 - 24% of the time  Memory Memory assist level: Recognizes or recalls 90% of the time/requires cueing < 10% of the time    Pain Pain Assessment Pain Assessment: No/denies pain  Therapy/Group: Individual Therapy  Koleman Marling, Melanee SpryNicole L 08/13/2015, 2:22 PM

## 2015-08-14 ENCOUNTER — Inpatient Hospital Stay (HOSPITAL_COMMUNITY): Payer: Self-pay | Admitting: Speech Pathology

## 2015-08-14 ENCOUNTER — Inpatient Hospital Stay (HOSPITAL_COMMUNITY): Payer: Self-pay | Admitting: Occupational Therapy

## 2015-08-14 ENCOUNTER — Ambulatory Visit (HOSPITAL_COMMUNITY): Payer: Self-pay | Admitting: Physical Therapy

## 2015-08-14 MED ORDER — POTASSIUM CHLORIDE CRYS ER 20 MEQ PO TBCR
20.0000 meq | EXTENDED_RELEASE_TABLET | Freq: Every day | ORAL | Status: DC
  Administered 2015-08-14 – 2015-08-17 (×4): 20 meq via ORAL
  Filled 2015-08-14 (×4): qty 1

## 2015-08-14 NOTE — Progress Notes (Signed)
Physical Therapy Note  Patient Details  Name: Jerre SimonSherry Biehler MRN: [id number] Date of Birth: [birth year] Today's Date: 08/14/2015    Time: 1000-1025 25 minutes  1:1 No c/o pain. Pt and husband performed pt/family education on gait, transfers, car transfers, ramp training with RW.  Pt performed all activities at supervision level with husband able to provide appropriate cuing for safety.  Pt/husband educated on energy conservation and safety tips for home, both verbalize understanding.  Pt states she has just a threshold, no step, to enter home and that home is completely level.  Pt's husband curious about use of rollator.  Pt attempted to use rollator but requires min A and pt states she feels "unsteady", pt will continue to use RW at this time.   Levan Aloia 08/14/2015, 10:25 AM

## 2015-08-14 NOTE — Progress Notes (Signed)
Subjective/Complaints:  Had questions about BP (up to 409 systolic yesterday)  Review of systems negative for shortness of breath, chest pain, nausea vomiting.   Objective: Vital Signs: Blood pressure 107/58, pulse 80, temperature 98.1 F (36.7 C), temperature source Oral, resp. rate 18, weight 105.4 kg (232 lb 5.8 oz), SpO2 97 %. No results found. Results for orders placed or performed during the hospital encounter of 07/24/15 (from the past 72 hour(s))  CBC     Status: Abnormal   Collection Time: 08/13/15  5:38 AM  Result Value Ref Range   WBC 5.0 4.0 - 10.5 K/uL   RBC 4.13 3.87 - 5.11 MIL/uL   Hemoglobin 9.4 (L) 12.0 - 15.0 g/dL   HCT 31.2 (L) 36.0 - 46.0 %   MCV 75.5 (L) 78.0 - 100.0 fL   MCH 22.8 (L) 26.0 - 34.0 pg   MCHC 30.1 30.0 - 36.0 g/dL   RDW 17.8 (H) 11.5 - 15.5 %   Platelets 290 150 - 400 K/uL  Basic metabolic panel     Status: Abnormal   Collection Time: 08/13/15  5:38 AM  Result Value Ref Range   Sodium 137 135 - 145 mmol/L   Potassium 3.3 (L) 3.5 - 5.1 mmol/L   Chloride 103 101 - 111 mmol/L   CO2 26 22 - 32 mmol/L   Glucose, Bld 97 65 - 99 mg/dL   BUN 18 6 - 20 mg/dL   Creatinine, Ser 1.30 (H) 0.44 - 1.00 mg/dL   Calcium 9.9 8.9 - 10.3 mg/dL   GFR calc non Af Amer 47 (L) >60 mL/min   GFR calc Af Amer 55 (L) >60 mL/min    Comment: (NOTE) The eGFR has been calculated using the CKD EPI equation. This calculation has not been validated in all clinical situations. eGFR's persistently <60 mL/min signify possible Chronic Kidney Disease.    Anion gap 8 5 - 15     HEENT: trach site closed with scab. Voice still hoarse Cardio: RRR and no murmurs or extra sounds Resp: CTA B/L and unlabored GI: BS positive and non-distended nontender Extremity:  Pulses positive and No Edema Skin:   Other ttrach site CDI, IV site left arm CDI Neuro: Alert/Oriented with good insight and awareness. Cranial Nerve II-XII normal, Normal Sensory, Abnormal Motor 4- /5 bilateral  deltoid, biceps, triceps, grip, hip flexor, knee extensor, ankle dorsiflexor and plantar flexor and Abnormal FMC Ataxic/ dec FMC persists. Good sitting balance Musc/Skel:  Normal Gen. no acute distress   Assessment/Plan: 1. 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 Physiatrist is providing close team supervision and 24 hour management of active medical problems listed below. Physiatrist and rehab team continue to assess barriers to discharge/monitor patient progress toward functional and medical goals.   FIM: Function - Bathing Position: Shower Body parts bathed by patient: Left arm, Right arm, Chest, Abdomen, Right upper leg, Left upper leg, Front perineal area, Buttocks, Right lower leg, Left lower leg, Back Body parts bathed by helper: Back Bathing not applicable: Front perineal area, Buttocks (Completed earlier with nursing per pt) Assist Level: Supervision or verbal cues, Set up Set up : To obtain items  Function- Upper Body Dressing/Undressing What is the patient wearing?: Pull over shirt/dress, Bra Bra - Perfomed by patient: Hook/unhook bra (pull down sports bra), Thread/unthread left bra strap, Thread/unthread right bra strap Bra -  Perfomed by helper: Thread/unthread right bra strap, Thread/unthread left bra strap, Hook/unhook bra (pull down sports bra) Pull over shirt/dress - Perfomed by patient: Thread/unthread left sleeve, Put head through opening, Thread/unthread right sleeve, Pull shirt over trunk Pull over shirt/dress - Perfomed by helper: Thread/unthread right sleeve, Thread/unthread left sleeve, Put head through opening, Pull shirt over trunk Assist Level: Supervision or verbal cues, Set up Set up : To obtain clothing/put away Function - Lower Body Dressing/Undressing Lower body dressing/undressing  activity did not occur: N/A (pt wearing dress today) What is the patient wearing?: Pants, Socks, Shoes Position: Other (comment) (sit to/from stand from tub transfer bench) Pants- Performed by patient: Thread/unthread right pants leg, Thread/unthread left pants leg, Pull pants up/down Pants- Performed by helper: Thread/unthread right pants leg, Thread/unthread left pants leg, Pull pants up/down Non-skid slipper socks- Performed by patient: Don/doff right sock, Don/doff left sock Non-skid slipper socks- Performed by helper: Don/doff left sock Socks - Performed by patient: Don/doff right sock, Don/doff left sock Shoes - Performed by patient: Don/doff right shoe, Don/doff left shoe, Fasten right, Fasten left Assist for footwear: Setup Assist for lower body dressing: Supervision or verbal cues, Touching or steadying assistance (Pt > 75%) (min guard/assist for sit to/from stands and dynamic standing during clothing management)  Function - Toileting Toileting activity did not occur: No continent bowel/bladder event Toileting steps completed by patient: Adjust clothing prior to toileting Toileting steps completed by helper: Performs perineal hygiene, Adjust clothing after toileting Toileting Assistive Devices: Grab bar or rail Assist level: Touching or steadying assistance (Pt.75%)  Function - Air cabin crew transfer activity did not occur: N/A Toilet transfer assistive device: Grab bar Assist level to toilet: Touching or steadying assistance (Pt > 75%) Assist level from toilet: Touching or steadying assistance (Pt > 75%)  Function - Chair/bed transfer Chair/bed transfer method: Stand pivot, Ambulatory Chair/bed transfer assist level: Touching or steadying assistance (Pt > 75%) Chair/bed transfer assistive device: Mechanical lift Mechanical lift: Stedy Chair/bed transfer details: Manual facilitation for placement, Manual facilitation for weight bearing, Verbal cues for technique,  Manual facilitation for weight shifting  Function - Locomotion: Wheelchair Will patient use wheelchair at discharge?: Yes Type: Manual Max wheelchair distance: 150 Assist Level: No help, No cues, assistive device, takes more than reasonable amount of time Assist Level: No help, No cues, assistive device, takes more than reasonable amount of time Wheel 150 feet activity did not occur: Safety/medical concerns Assist Level: No help, No cues, assistive device, takes more than reasonable amount of time Turns around,maneuvers to table,bed, and toilet,negotiates 3% grade,maneuvers on rugs and over doorsills: No Function - Locomotion: Ambulation Ambulation activity did not occur: Safety/medical concerns (unsafe to attempt due to weakness) Assistive device: Walker-rolling Max distance: 180 Assist level: Touching or steadying assistance (Pt > 75%) Assist level: Touching or steadying assistance (Pt > 75%) Walk 50 feet with 2 turns activity did not occur: Safety/medical concerns Assist level: Touching or steadying assistance (Pt > 75%) Walk 150 feet activity did not occur: Safety/medical concerns Assist level: Touching or steadying assistance (Pt > 75%) Walk 10 feet on uneven surfaces activity did not occur: Safety/medical concerns  Function - Comprehension Comprehension: Auditory Comprehension assist level: Follows complex conversation/direction with no assist  Function - Expression Expression: Verbal Expression assistive device: Talk trach valve Expression assist level: Expresses complex 90% of the time/cues < 10% of the time  Function - Social Interaction Social Interaction assist level: Interacts appropriately with others - No medications needed.  Function -  Problem Solving Problem solving assist level: Solves complex problems: Recognizes & self-corrects  Function - Memory Memory assist level: Complete Independence: No helper Patient normally able to recall (first 3 days only):  Current season   Medical Problem List and Plan: 1.  Functional deficits, right hemiparesis and general debility secondary to hypertensive left parietal and pontine hemorrhages with subsequent complications,in addition has some left hemiparesis  Related to Right frontoparietal subacute infarcts  - Cont CIR PT, OT SLP  -team conference today 2.  DVT Prophylaxis/Anticoagulation: Pharmaceutical: Lovenox  31m daily indicated 3. Pain Management:    low dose ultram prn effective  -pain generally muscle soreness at this point which has improved also 4. Mood: team to provide ego support. LCSW to follow for evaluation.   5. Neuropsych: This patient is not capable of making decisions on her own behalf. 6. Skin/Wound Care:   Maintain adequate nutrition and hydration.   7. Fluids/Electrolytes/Nutrition: Monitor I/O on current diet.  Check lytes in am. 8. VDRF--resolved. Discussed vocal quality. Given her current volume and voice quality, I expect further improvement. May need outpt ENT assessment 9. HTN: Continue norvasc, lisinopril, apresoline, metoprolol and HCTZ.   reduced HCTZ  Filed Vitals:   08/13/15 2147 08/14/15 0520  BP: 124/70 107/58  Pulse:  80  Temp:  98.1 F (36.7 C)  Resp:  18   10 ARF- improving off bactrim and lisinopril    -replacing potassium BMP Latest Ref Rng 08/13/2015 08/07/2015 08/05/2015  Glucose 65 - 99 mg/dL 97 89 91  BUN 6 - 20 mg/dL 18 21(H) 31(H)  Creatinine 0.44 - 1.00 mg/dL 1.30(H) 1.45(H) 1.28(H)  Sodium 135 - 145 mmol/L 137 137 135  Potassium 3.5 - 5.1 mmol/L 3.3(L) 3.7 4.0  Chloride 101 - 111 mmol/L 103 103 105  CO2 22 - 32 mmol/L 26 23 22   Calcium 8.9 - 10.3 mg/dL 9.9 10.2 10.1    11. ABLA: no clinical signs of bleeding. hgb holding steady CBC Latest Ref Rng 08/13/2015 08/08/2015 07/25/2015  WBC 4.0 - 10.5 K/uL 5.0 6.0 9.0  Hemoglobin 12.0 - 15.0 g/dL 9.4(L) 9.7(L) 9.4(L)  Hematocrit 36.0 - 46.0 % 31.2(L) 32.0(L) 31.3(L)  Platelets 150 - 400 K/uL 290 480(H)  412(H)      12. Dysphagia: Monitor tolerance of dysphagia 2,thins, 75% meals 13. Abnormal LFT:  basically resolved  14. Anxiety: Monitor on buspar bid.  15. Diarrhea: improved , daily BMs, no incont  16.  Hypoalbuminemia:  Prostat   LOS (Days) 21 A FACE TO FACE EVALUATION WAS PERFORMED  SWARTZ,ZACHARY T 08/14/2015, 8:54 AM

## 2015-08-14 NOTE — Progress Notes (Signed)
Speech Language Pathology Daily Session Note  Patient Details  Name: Gwendolyn SimonSherry Kunz MRN: [id number] Date of Birth: September 04, 1965  Today's Date: 08/14/2015 SLP Individual Time: 0903-1000 SLP Individual Time Calculation (min): 57 min  Short Term Goals: Week 3: SLP Short Term Goal 1 (Week 3): Pt will consume regular diet and thin liquids with Mod I for use of swallowing precautions and minimal overt s/s of aspiration  SLP Short Term Goal 2 (Week 3): Pt will be intelligible at the sentence level in 75% of opportunities with supervision cues for pacing, overarticulation, and increased vocal intensity.  SLP Short Term Goal 3 (Week 3): Pt will demonstrate selective attention to basic tasks in a mildly distracting environment for 30 minutes with supervision cues.  SLP Short Term Goal 4 (Week 3): Pt will recall basic, daily information with Mod I for use of external aids SLP Short Term Goal 5 (Week 3): Pt will monitor and correct verbal errors in 75% of opportunities with supervision verbal cues.   Skilled Therapeutic Interventions:  Pt was seen for skilled ST targeting family education.  Husband was present during today's therapy session for training.  Therapist facilitated the session with skilled education regarding currently recommended diet and swallowing precautions.  Pt's husband aware that pt will likely be upgraded to regular textures prior to discharge home.  Therapist also discussed speech intelligibility and word finding strategies to maximize pt's independence for functional communication.  Skilled strategy instruction was provided regarding memory compensatory strategies.  Handouts were provided to facilitate carryover in the home environment.  Therapist recommended that pt have assistance for medication and financial management at discharge.  Pt and husband in agreement with recommended plan of care.  All questions were answered to pt's and family's satisfaction at this time.  Pt was handed off to  PT at the end of today's therapy session.  Continue per current plan of care.     Function:  Eating Eating                 Cognition Comprehension Comprehension assist level: Follows complex conversation/direction with extra time/assistive device  Expression   Expression assist level: Expresses basic needs/ideas: With no assist  Social Interaction Social Interaction assist level: Interacts appropriately with others with medication or extra time (anti-anxiety, antidepressant).  Problem Solving Problem solving assist level: Solves basic 90% of the time/requires cueing < 10% of the time  Memory Memory assist level: Recognizes or recalls 90% of the time/requires cueing < 10% of the time    Pain Pain Assessment Pain Assessment: No/denies pain  Therapy/Group: Individual Therapy  Shweta Aman, Melanee SpryNicole L 08/14/2015, 10:51 AM

## 2015-08-14 NOTE — Patient Care Conference (Signed)
Inpatient RehabilitationTeam Conference and Plan of Care Update Date: 08/14/2015   Time: 10:50 AM    Patient Name: Gwendolyn SimonSherry Tata      Medical Record Number: [id number]  Date of Birth: 11-23-1965 Sex: Female         Room/Bed: 4M07C/4M07C-01 Payor Info: Payor: MEDICAID POTENTIAL / Plan: MEDICAID POTENTIAL / Product Type: *No Product type* /    Admitting Diagnosis: [name] - ICH  Admit Date/Time:  07/24/2015  5:01 PM Admission Comments: No comment available   Primary Diagnosis:  Cerebral parenchymal hemorrhage (HCC) Principal Problem: Cerebral parenchymal hemorrhage [facility](HCC)  Patient Active Problem List   Diagnosis Date Noted  . Adjustment disorder with depressed mood   . Cerebral parenchymal hemorrhage (HCC) 07/24/2015  . 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 Elevated bilirubin   . Adjustment disorder with mixed anxiety and depressed mood   . Diastolic dysfunction   . Morbid obesity due to excess calories (HCC)   . H/O noncompliance with medical treatment, presenting hazards to health   . Tachypnea   . Acute blood loss anemia   . Dysphagia, post-stroke   . HTN (hypertension)   . Productive cough   . Tracheostomy status (HCC)   . 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 Hypokalemia   . Azotemia   . Acute respiratory failure (HCC)   . Acute respiratory failure with hypoxemia (HCC)   . 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 ICH (intracerebral hemorrhage) (HCC) 07/12/2015    Expected Discharge Date: Expected Discharge Date: 08/17/15  Team Members Present: Physician leading conference: Dr. Faith RogueZachary Swartz Social Worker Present: Dossie DerBecky Taim Wurm, LCSW Nurse Present: Kennyth ArnoldStacey Jennings, RN PT Present: Other (comment) Sherre Lain(Karen Donaworth-PT) OT Present: Perrin MalteseJames McGuire, OT SLP Present: Jackalyn LombardNicole Page, SLP     Current Status/Progress Goal Weekly Team Focus  Medical   bp and renal function  stabilizing. pain minimal  stabilize medically for discharge  bp mgt/nutrition   Bowel/Bladder   continent of bowel and bladder; wears brief for urgency. LBM 08/14/15  Cont of bowel and bladder  Remain cont of bowel and bladder    Swallow/Nutrition/ Hydration   Dys 3, thin liquids; advanced to intermittent supervision    mod I with least restrictive diet   trial meal of regular textures prior to advancement    ADL's   Min guard assist for transfers using the RW, and for LB selfcare tasks. supervision for UB selfcare.  Overall supervision to mod I  ADl retraining, transfer training, dynamic standing balance, UE functional strengthening, pt/family education   Mobility   min assist for transfers, gait x 180' and 12 steps 2 rails; modified independent w/c on unit- assist with footrests  LTGs upgraded; supervision transfers, gait x 150' up/down 4 steps, modified independent w/c x 150' on level terrain  activity tolerance, family ed, NMR, mobility and locomotion, pt ed; DME   Communication   supervision for intelligibility   supervision   completion of family education prior to discharge    Safety/Cognition/ Behavioral Observations  supervision   supervision   completion of family education prior to discharge    Pain             Skin   Old Trach site Clean and dry   No new skin breakdown while on Rehab  Assess q shift and prn     Rehab Goals Patient on target to meet rehab goals: Yes *See Care Plan and  progress notes for long and short-term goals.  Barriers to Discharge: weakness/balance deficits    Possible Resolutions to Barriers:  continued therapy and adaptive equipment training. family ed    Discharge Planning/Teaching Needs:    Home with husband and son who can provide between them 24 hr care. Husband is here for family education today. Preparing for DC Friday     Team Discussion:  Meeting upgraded goals to supervision level. SP to observe for lunch and upgrade to regular diet.  BP better and kidneys improved. Continuing to work on balance issues. Son and husband to provide 24 hr supervision   Revisions to Treatment Plan:  DC Friday 7/7   Continued Need for Acute Rehabilitation Level of Care: The patient requires daily medical management by a physician with specialized training in physical medicine and rehabilitation for the following conditions: Daily direction of a multidisciplinary physical rehabilitation program to ensure safe treatment while eliciting the highest outcome that is of practical value to the patient.: Yes Daily medical management of patient stability for increased activity during participation in an intensive rehabilitation regime.: Yes Daily analysis of laboratory values and/or radiology reports with any subsequent need for medication adjustment of medical intervention for : Neurological problems;Cardiac problems;Renal problems  Lucy ChrisDupree, Jaelan Rasheed G 08/15/2015, 11:42 AM

## 2015-08-14 NOTE — Progress Notes (Signed)
Occupational Therapy Session Note  Patient Details  Name: Gwendolyn SimonSherry Reamy MRN: [id number] Date of Birth: 02-04-1966  Today's Date: 08/14/2015 OT Individual Time: 1102-1207 OT Individual Time Calculation (min): 65 min    Short Term Goals: Week 3:  OT Short Term Goal 1 (Week 3): STGs = LTGs  Skilled Therapeutic Interventions/Progress Updates:    Treatment session with focus on family education with pt and pt's husband.  Pt declined bathing this session but willing to participate in education with husband.  Ambulated to ADL apt with RW and contact guard.  Pt completed tub bench transfer with RW with setup only.  Discussed recommendation for supervision with bathing and shower transfers, pt's husband reports plan to alter his work schedule to provide supervision for self-care tasks in AM prior to going to work and then son present throughout day to provide supervision as needed while husband is at work.  Completed toilet transfers with RW and supervision, engaged in simulated clothing management with theraband to pull pants up/down during toileting.  Discussed energy conservation strategies during ADL and IADLs and provided handout.  Pt's husband asking about how to assist pt if she falls.  Educated on fall risk and proper floor transfer method.  Engaged in floor transfer with pt requiring assist to position legs appropriately to get into tall kneeling and then pull up on to mat, requiring +2 assist to stabilize and get from tall kneeling into standing.  Returned to room via w/c for due to fatigue at end of session.  Therapy Documentation Precautions:  Precautions Precautions: Fall Precaution Comments: trach plugged for therapy Restrictions Weight Bearing Restrictions: No Pain: Pain Assessment Pain Assessment: No/denies pain  See Function Navigator for Current Functional Status.   Therapy/Group: Individual Therapy  Rosalio LoudHOXIE, [place] 08/14/2015, 12:25 PM

## 2015-08-14 NOTE — Progress Notes (Signed)
Social Work Patient ID: Gwendolyn Morales, female   DOB: [birth year], 50 y.o.   MRN: [id number] Met with pt and husband who was here for family training, it went well and both pleased with her progress here. Aware of team conference and upgraded goals to supervision level. Husband can get equipment pt needs for home through his parents and borrowed others. Discussed with him going to [facility] to get the eligibility packet to get pt connected to their program. He is aware of the clinic and will go by there to begin the process. Discussed follow up and [facility] program will continue to work toward discharge Friday.

## 2015-08-14 NOTE — Progress Notes (Signed)
Physical Therapy Weekly Progress Note  Patient Details  Name: Gwendolyn Morales MRN: [id number] Date of Birth: [birth year]  Beginning of progress report period: August 07, 2015 End of progress report period: August 14, 2015   Patient has met 2 of 3 short term goals.  Pt has improved endurance and strength to increase gait distance and progress to supervision level for transfers and gait. Pt still with weakness requiring increased assist for climbing stairs.  Patient continues to demonstrate the following deficits: decreased balance, strength and endurance and therefore will continue to benefit from skilled PT intervention to enhance overall performance with activity tolerance, balance, postural control and ability to compensate for deficits.  Patient progressing toward long term goals..  Continue plan of care.  PT Short Term Goals Week 3:  PT Short Term Goal 1 (Week 3): pt will gait in controlled environment 100' with min A PT Short Term Goal 1 - Progress (Week 3): Met PT Short Term Goal 2 (Week 3): Pt will perform bed mobility with min A PT Short Term Goal 2 - Progress (Week 3): Met PT Short Term Goal 3 (Week 3): Pt will negotiate 4 steps min A with 2 handrails PT Short Term Goal 3 - Progress (Week 3): Progressing toward goal  Skilled Therapeutic Interventions/Progress Updates:  Ambulation/gait training;Balance/vestibular training;Cognitive remediation/compensation;Community reintegration;Discharge planning;Neuromuscular re-education;Functional mobility training;Functional electrical stimulation;DME/adaptive equipment instruction;Patient/family education;Pain management;Psychosocial support;Splinting/orthotics;UE/LE Coordination activities;UE/LE Strength taining/ROM;Therapeutic Exercise;Therapeutic Activities;Stair training;Wheelchair propulsion/positioning    See Function Navigator for Current Functional Status.  Rodrigo Mcgranahan 08/14/2015, 10:49 AM

## 2015-08-14 NOTE — Progress Notes (Signed)
Occupational Therapy Session Note  Patient Details  Name: Gwendolyn Morales MRN: [id number] Date of Birth: 1965/09/12  Today's Date: 08/14/2015 OT Individual Time: [id number] OT Individual Time Calculation (min): 58 min    Short Term Goals: Week 3:  OT Short Term Goal 1 (Week 3): STGs = LTGs  Skilled Therapeutic Interventions/Progress Updates:    Pt completed ADL session with husband present.  She was able to ambulate to the shower and to the wheelchair with the RW for bathing and dressing tasks with close supervision.  She completed bathing in sitting with standing for washing and rinsing peri area with close supervision.  Pt utilized grab bar for balance in standing, which she states she has at home.  Dressing completed at the wheelchair with supervision overall sit to stand.  Pt used the RW to ambulate to the sink and stand to brush her hair with supervision as well.  Discussed with husband technique for sitting on the shower seat and completing most of drying before transferring out and standing to dry off peri area.  Also instructed him to remove all throw rugs and take any towels that are on the floor up as well before transferring out to the EOB or chair to dress.  He voiced understanding of this.  Pt left in wheelchair at bedside with husband present at end of session.    Therapy Documentation Precautions:  Precautions Precautions: Fall Precaution Comments: trach plugged for therapy Restrictions Weight Bearing Restrictions: No  Pain: Pain Assessment Pain Assessment: No/denies pain ADL: See Function Navigator for Current Functional Status.   Therapy/Group: Individual Therapy  Keanna Tugwell OTR/L 08/14/2015, 3:50 PM

## 2015-08-15 ENCOUNTER — Inpatient Hospital Stay (HOSPITAL_COMMUNITY): Payer: Self-pay | Admitting: Occupational Therapy

## 2015-08-15 ENCOUNTER — Inpatient Hospital Stay (HOSPITAL_COMMUNITY): Payer: Self-pay

## 2015-08-15 ENCOUNTER — Inpatient Hospital Stay (HOSPITAL_COMMUNITY): Payer: Self-pay | Admitting: Speech Pathology

## 2015-08-15 NOTE — Progress Notes (Signed)
Occupational Therapy Session Note  Patient Details  Name: Jerre SimonSherry Reise MRN: [id number] Date of Birth: 1965/06/14  Today's Date: 08/15/2015 OT Individual Time: 1100-1200 OT Individual Time Calculation (min): 60 min    Short Term Goals: Week 3:  OT Short Term Goal 1 (Week 3): STGs = LTGs  Skilled Therapeutic Interventions/Progress Updates:    Treatment session with focus on functional mobility, trunk control, and activity tolerance.  Pt ambulated to therapy gym with RW and contact guard - supervision.  Pt verbalizing desire to work on leg strength.  Engaged in standing activity with focus on trunk control to obtain items at mat height to incorporate rotation, knee flexion, and upright standing with and without UE support while completing table top task.  Pt reports fatigue throughout session, stating that she did not realize how quickly she fatigues.  Pt tolerated standing for 5 minute periods before requiring seated rest break, but completed task in 30 mins (standing total of 20).  Discussed functional carryover to ADLs and IADLs and reiterated energy conservation strategies and discussed during previous sessions.  Pt verbalized understanding, but continues to focus on decreased endurance and length of recovery process.  Therapy Documentation Precautions:  Precautions Precautions: Fall Precaution Comments: trach plugged for therapy Restrictions Weight Bearing Restrictions: No Pain: Pain Assessment Pain Assessment: No/denies pain  See Function Navigator for Current Functional Status.   Therapy/Group: Individual Therapy  Rosalio LoudHOXIE, Rosie Golson 08/15/2015, 12:21 PM

## 2015-08-15 NOTE — Progress Notes (Signed)
Subjective/Complaints:  Feeling pretty good. Is a little "sore" from therapies. Otherwise progressing.   Review of systems negative for shortness of breath, chest pain, nausea vomiting.   Objective: Vital Signs: Blood pressure 110/61, pulse 71, temperature 98.2 F (36.8 C), temperature source Oral, resp. rate 18, weight 105.2 kg (231 lb 14.8 oz), SpO2 94 %. No results found. Results for orders placed or performed during the hospital encounter of 07/24/15 (from the past 72 hour(s))  CBC     Status: Abnormal   Collection Time: 08/13/15  5:38 AM  Result Value Ref Range   WBC 5.0 4.0 - 10.5 K/uL   RBC 4.13 3.87 - 5.11 MIL/uL   Hemoglobin 9.4 (L) 12.0 - 15.0 g/dL   HCT 31.2 (L) 36.0 - 46.0 %   MCV 75.5 (L) 78.0 - 100.0 fL   MCH 22.8 (L) 26.0 - 34.0 pg   MCHC 30.1 30.0 - 36.0 g/dL   RDW 17.8 (H) 11.5 - 15.5 %   Platelets 290 150 - 400 K/uL  Basic metabolic panel     Status: Abnormal   Collection Time: 08/13/15  5:38 AM  Result Value Ref Range   Sodium 137 135 - 145 mmol/L   Potassium 3.3 (L) 3.5 - 5.1 mmol/L   Chloride 103 101 - 111 mmol/L   CO2 26 22 - 32 mmol/L   Glucose, Bld 97 65 - 99 mg/dL   BUN 18 6 - 20 mg/dL   Creatinine, Ser 1.30 (H) 0.44 - 1.00 mg/dL   Calcium 9.9 8.9 - 10.3 mg/dL   GFR calc non Af Amer 47 (L) >60 mL/min   GFR calc Af Amer 55 (L) >60 mL/min    Comment: (NOTE) The eGFR has been calculated using the CKD EPI equation. This calculation has not been validated in all clinical situations. eGFR's persistently <60 mL/min signify possible Chronic Kidney Disease.    Anion gap 8 5 - 15     HEENT: trach site closed with scab. Voice still hoarse Cardio: RRR and no murmurs or extra sounds Resp: CTA B/L and unlabored GI: BS positive and non-distended nontender Extremity:  Pulses positive and No Edema Skin:   Other ttrach site CDI, IV site left arm CDI Neuro: Alert/Oriented with good insight and awareness. Cranial Nerve II-XII normal, Normal Sensory,  Abnormal Motor 4- /5 bilateral deltoid, biceps, triceps, grip, hip flexor, knee extensor, ankle dorsiflexor and plantar flexor and Abnormal FMC Ataxic/ dec FMC persists. Good sitting balance Musc/Skel:  Normal Gen. no acute distress   Assessment/Plan: 1. 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 Physiatrist is providing close team supervision and 24 hour management of active medical problems listed below. Physiatrist and rehab team continue to assess barriers to discharge/monitor patient progress toward functional and medical goals.   FIM: Function - Bathing Position: Shower Body parts bathed by patient: Left arm, Right arm, Chest, Abdomen, Right upper leg, Left upper leg, Front perineal area, Buttocks, Right lower leg, Left lower leg, Back Body parts bathed by helper: Back Bathing not applicable: Front perineal area, Buttocks (Completed earlier with nursing per pt) Assist Level: Supervision or verbal cues Set up : To obtain items  Function- Upper Body Dressing/Undressing What is the patient wearing?: Bra, Pull over shirt/dress Bra - Perfomed by patient: Hook/unhook bra (pull down sports bra), Thread/unthread left bra strap, Thread/unthread right bra strap Bra -  Perfomed by helper: Thread/unthread right bra strap, Thread/unthread left bra strap, Hook/unhook bra (pull down sports bra) Pull over shirt/dress - Perfomed by patient: Thread/unthread left sleeve, Put head through opening, Thread/unthread right sleeve, Pull shirt over trunk Pull over shirt/dress - Perfomed by helper: Thread/unthread right sleeve, Thread/unthread left sleeve, Put head through opening, Pull shirt over trunk Assist Level: Set up Set up : To obtain clothing/put away Function - Lower Body Dressing/Undressing Lower body dressing/undressing activity  did not occur: N/A (pt wearing dress today) What is the patient wearing?: Pants, Socks, Shoes Position: Other (comment) (sit to/from stand from tub transfer bench) Pants- Performed by patient: Thread/unthread right pants leg, Thread/unthread left pants leg, Pull pants up/down Pants- Performed by helper: Thread/unthread right pants leg, Thread/unthread left pants leg, Pull pants up/down Non-skid slipper socks- Performed by patient: Don/doff right sock, Don/doff left sock Non-skid slipper socks- Performed by helper: Don/doff left sock Socks - Performed by patient: Don/doff right sock, Don/doff left sock Shoes - Performed by patient: Don/doff right shoe, Don/doff left shoe, Fasten right, Fasten left Assist for footwear: Setup Assist for lower body dressing: Supervision or verbal cues  Function - Toileting Toileting activity did not occur: No continent bowel/bladder event Toileting steps completed by patient: Adjust clothing prior to toileting Toileting steps completed by helper: Performs perineal hygiene, Adjust clothing after toileting Toileting Assistive Devices: Grab bar or rail Assist level: Touching or steadying assistance (Pt.75%)  Function - Air cabin crew transfer activity did not occur: N/A Toilet transfer assistive device: Grab bar, Walker Assist level to toilet: Supervision or verbal cues Assist level from toilet: Supervision or verbal cues  Function - Chair/bed transfer Chair/bed transfer method: Stand pivot, Ambulatory Chair/bed transfer assist level: Supervision or verbal cues Chair/bed transfer assistive device: Mechanical lift Mechanical lift: Stedy Chair/bed transfer details: Manual facilitation for placement, Manual facilitation for weight bearing, Verbal cues for technique, Manual facilitation for weight shifting  Function - Locomotion: Wheelchair Will patient use wheelchair at discharge?: Yes Type: Manual Max wheelchair distance: 150 Assist Level: No  help, No cues, assistive device, takes more than reasonable amount of time Assist Level: No help, No cues, assistive device, takes more than reasonable amount of time Wheel 150 feet activity did not occur: Safety/medical concerns Assist Level: No help, No cues, assistive device, takes more than reasonable amount of time Turns around,maneuvers to table,bed, and toilet,negotiates 3% grade,maneuvers on rugs and over doorsills: No Function - Locomotion: Ambulation Ambulation activity did not occur: Safety/medical concerns (unsafe to attempt due to weakness) Assistive device: Walker-rolling Max distance: 150 Assist level: Supervision or verbal cues Assist level: Touching or steadying assistance (Pt > 75%) Walk 50 feet with 2 turns activity did not occur: Safety/medical concerns Assist level: Supervision or verbal cues Walk 150 feet activity did not occur: Safety/medical concerns Assist level: Supervision or verbal cues Walk 10 feet on uneven surfaces activity did not occur: Safety/medical concerns  Function - Comprehension Comprehension: Auditory Comprehension assist level: Follows complex conversation/direction with extra time/assistive device  Function - Expression Expression: Verbal Expression assistive device: Talk trach valve Expression assist level: Expresses basic needs/ideas: With no assist  Function - Social Interaction Social Interaction assist level: Interacts appropriately with others with medication or extra time (anti-anxiety, antidepressant).  Function - Problem Solving Problem solving assist level: Solves basic 90% of the time/requires cueing < 10% of the time  Function - Memory Memory assist level: Recognizes or recalls 90% of the time/requires cueing < 10% of the time Patient normally able  to recall (first 3 days only): Current season   Medical Problem List and Plan: 1.  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  DVT Prophylaxis/Anticoagulation: Pharmaceutical: Lovenox  83m daily indicated 3. Pain Management:    low dose ultram prn effective  -pain generally muscle soreness at this point which has improved also 4. Mood: team to provide ego support. LCSW to follow for evaluation.   5. Neuropsych: This patient is not capable of making decisions on her own behalf. 6. Skin/Wound Care:   Maintain adequate nutrition and hydration.   7. Fluids/Electrolytes/Nutrition: Monitor I/O on current diet.  Check lytes in am. 8. VDRF--resolved. Discussed vocal quality. Given her current volume and voice quality, I expect further improvement. May need outpt ENT assessment 9. HTN: Continue norvasc, lisinopril, apresoline, metoprolol and HCTZ.   reduced HCTZ  Filed Vitals:   08/14/15 2127 08/15/15 0525  BP: 135/77 110/61  Pulse: 77 71  Temp:  98.2 F (36.8 C)  Resp:  18   10 ARF- improving off bactrim and lisinopril    -replacing potassium  -recheck once more tomorrow morning BMP Latest Ref Rng 08/13/2015 08/07/2015 08/05/2015  Glucose 65 - 99 mg/dL 97 89 91  BUN 6 - 20 mg/dL 18 21(H) 31(H)  Creatinine 0.44 - 1.00 mg/dL 1.30(H) 1.45(H) 1.28(H)  Sodium 135 - 145 mmol/L 137 137 135  Potassium 3.5 - 5.1 mmol/L 3.3(L) 3.7 4.0  Chloride 101 - 111 mmol/L 103 103 105  CO2 22 - 32 mmol/L 26 23 22   Calcium 8.9 - 10.3 mg/dL 9.9 10.2 10.1    11. ABLA: no clinical signs of bleeding. hgb holding steady. Re-check in am CBC Latest Ref Rng 08/13/2015 08/08/2015 07/25/2015  WBC 4.0 - 10.5 K/uL 5.0 6.0 9.0  Hemoglobin 12.0 - 15.0 g/dL 9.4(L) 9.7(L) 9.4(L)  Hematocrit 36.0 - 46.0 % 31.2(L) 32.0(L) 31.3(L)  Platelets 150 - 400 K/uL 290 480(H) 412(H)      12. Dysphagia: Monitor tolerance of dysphagia 2,thins, 75% meals 13.  Abnormal LFT:  basically resolved  14. Anxiety: Monitor on buspar bid.  15. Diarrhea: improved , daily BMs, no incont  16.  Hypoalbuminemia:  Prostat   LOS (Days) 22 A FACE TO FACE EVALUATION WAS PERFORMED  Kandon Hosking T 08/15/2015, 8:55 AM

## 2015-08-15 NOTE — Progress Notes (Signed)
Physical Therapy Note  Patient Details  Name: Gwendolyn SimonSherry Epting MRN: [id number] Date of Birth: 08/16/1965 Today's Date: 08/15/2015  [id number], 60 min individual Pain: bil iknees aching 7/10 premedicated  Gait with grocery cart x 120' x 2 on level tile and carpet;  for increased fluidity of motion; x 120' x 1 with RW, x 160' with w/c as AD.  Pt cued to take longer steps on carpet to improve foot clearance.  Sit>< stand x 5 without use of UEs, working on Estate manager/land agentbody mechanics.  neuromuscular re-education via visual feedback, VCS for seated 10 x 1 bil heel/toe raies and alternating heel/toe raises, R/L hip flexion in posterior pelvic tilt.  Reviewed diaphragmatic breathing and avoiding talking while ambulating, for better breath support and energy conservation. [place] [facility] handout issued; pt performed seated R/L long arc knee ext with 5 second holds; standing bil heel raises/toe raises; R/L hip abduction, x 5-8 with multimodal cues for accuracy of movements.   PASS: 29/36. [name] 08/15/2015, 7:50 AM

## 2015-08-15 NOTE — Progress Notes (Signed)
Recreational Therapy Discharge Summary Patient Details  Name: Gwendolyn Morales MRN: [id number] Date of Birth: 01/28/1966 Today's Date: 08/15/2015  Long term goals set: 1  Long term goals met: 1  Comments on progress toward goals: Pt has made great progress toward LOS and is ready for discharge home with family 7/7 to provide 24 hour supervision/assist.  Pt is discharging at overall supervision seated level for simple TR tasks.  Pt requires set up assist, occasional cuing for safe task completion.  Pt is motivated to regain further independence and excited about upcoming discharge.  Education provided on the importance of staying active and engaged in leisure, activity analysis with potential adaptations & energy conservation.   Reasons for discharge: discharge from hospital    Patient/family agrees with progress made and goals achieved: Yes  SIMPSON,LISA 08/15/2015, 12:30 PM     

## 2015-08-15 NOTE — Progress Notes (Signed)
Speech Language Pathology Daily Session Notes  Patient Details  Name: Gwendolyn Morales MRN: [id number] Date of Birth: 08-31-1965  Today's Date: 08/15/2015  Session 1 SLP Individual Time: 0805-0900 SLP Individual Time Calculation (min): 55 min Session 2 SLP Individual Time: 1500-1535 SLP Individual Time Calculation (min):35 min   Short Term Goals: Week 3: SLP Short Term Goal 1 (Week 3): Pt will consume regular diet and thin liquids with Mod I for use of swallowing precautions and minimal overt s/s of aspiration  SLP Short Term Goal 2 (Week 3): Pt will be intelligible at the sentence level in 75% of opportunities with supervision cues for pacing, overarticulation, and increased vocal intensity.  SLP Short Term Goal 3 (Week 3): Pt will demonstrate selective attention to basic tasks in a mildly distracting environment for 30 minutes with supervision cues.  SLP Short Term Goal 4 (Week 3): Pt will recall basic, daily information with Mod I for use of external aids SLP Short Term Goal 5 (Week 3): Pt will monitor and correct verbal errors in 75% of opportunities with supervision verbal cues.   Skilled Therapeutic Interventions: Session 1 Skilled treatment session focused on addressing speech and swallow goals. SLP facilitated session by providing increased time for patient to complete tray set-up as well as increased time for efficient mastication of regular textures and thin liquids with no overt s/s of aspiration.  Following meal patient benefited from Min verbal cues to monitor and correct wet vocal quality as well as to intermittently hydrate to increase vocal clarity during conversation level verbal expression in a distracting environment. During task patient Mod I for pacing and breath support.  Continue with current plan of care.   Session 2  Skilled treatment session focused on addressing cognition goals. SLP facilitated session by re-assessing abilities with the [facility],  MOCA.  Patient demonstrated skills consistent with a score of 27/30 with 26 or greater being considered WFL.  Patient did demonstrate 1 error with delayed recall and was Morales to name 4 items in a minute.  Patient continues to report that word retrieval difficulties are limiting and frustrating.  SLP provided home management/exercises to continue to address these deficits in her own time.  Continue with current plan of care.      Function:  Eating Eating   Modified Consistency Diet: No Eating Assist Level: More than reasonable amount of time           Cognition Comprehension Comprehension assist level: Follows complex conversation/direction with extra time/assistive device  Expression   Expression assist level: Expresses basic needs/ideas: With no assist  Social Interaction Social Interaction assist level: Interacts appropriately with others with medication or extra time (anti-anxiety, antidepressant).  Problem Solving Problem solving assist level: Solves basic 90% of the time/requires cueing < 10% of the time  Memory Memory assist level: Recognizes or recalls 90% of the time/requires cueing < 10% of the time    Pain Pain Assessment Pain Assessment: No/denies pain x2  Therapy/Group: Individual Therapy x2  Gwendolyn Morales, M.A., CCC-SLP [phone]  Gwendolyn Morales 08/15/2015, 8:25 AM

## 2015-08-16 ENCOUNTER — Inpatient Hospital Stay (HOSPITAL_COMMUNITY): Payer: Self-pay | Admitting: Physical Therapy

## 2015-08-16 ENCOUNTER — Inpatient Hospital Stay (HOSPITAL_COMMUNITY): Payer: Self-pay | Admitting: Speech Pathology

## 2015-08-16 ENCOUNTER — Inpatient Hospital Stay (HOSPITAL_COMMUNITY): Payer: Self-pay | Admitting: Occupational Therapy

## 2015-08-16 LAB — BASIC METABOLIC PANEL
Anion gap: 7 (ref 5–15)
BUN: 14 mg/dL (ref 6–20)
CO2: 27 mmol/L (ref 22–32)
Calcium: 9.5 mg/dL (ref 8.9–10.3)
Chloride: 104 mmol/L (ref 101–111)
Creatinine, Ser: 1.23 mg/dL — ABNORMAL HIGH (ref 0.44–1.00)
GFR calc Af Amer: 59 mL/min — ABNORMAL LOW (ref >60)
GFR, EST NON AFRICAN AMERICAN: 51 mL/min — AB (ref >60)
Glucose, Bld: 96 mg/dL (ref 65–99)
Potassium: 3.4 mmol/L — ABNORMAL LOW (ref 3.5–5.1)
SODIUM: 138 mmol/L (ref 135–145)

## 2015-08-16 LAB — CBC
HCT: 30.3 % — ABNORMAL LOW (ref 36.0–46.0)
Hemoglobin: 9.4 g/dL — ABNORMAL LOW (ref 12.0–15.0)
MCH: 23.7 pg — AB (ref 26.0–34.0)
MCHC: 31 g/dL (ref 30.0–36.0)
MCV: 76.3 fL — ABNORMAL LOW (ref 78.0–100.0)
PLATELETS: 255 10*3/uL (ref 150–400)
RBC: 3.97 MIL/uL (ref 3.87–5.11)
RDW: 18 % — ABNORMAL HIGH (ref 11.5–15.5)
WBC: 5.2 10*3/uL (ref 4.0–10.5)

## 2015-08-16 MED ORDER — FERROUS SULFATE 325 (65 FE) MG PO TABS
325.0000 mg | ORAL_TABLET | Freq: Every day | ORAL | Status: DC
  Administered 2015-08-16 – 2015-08-17 (×2): 325 mg via ORAL
  Filled 2015-08-16 (×2): qty 1

## 2015-08-16 NOTE — Progress Notes (Signed)
Subjective/Complaints:  Up in chair. Anxious to get home!-  Review of systems negative for shortness of breath, chest pain, nausea vomiting.   Objective: Vital Signs: Blood pressure 117/65, pulse 67, temperature 98.1 F (36.7 C), temperature source Oral, resp. rate 18, weight 105.2 kg (231 lb 14.8 oz), SpO2 97 %. No results found. Results for orders placed or performed during the hospital encounter of 07/24/15 (from the past 72 hour(s))  CBC     Status: Abnormal   Collection Time: 08/16/15  6:01 AM  Result Value Ref Range   WBC 5.2 4.0 - 10.5 K/uL   RBC 3.97 3.87 - 5.11 MIL/uL   Hemoglobin 9.4 (L) 12.0 - 15.0 g/dL   HCT 30.3 (L) 36.0 - 46.0 %   MCV 76.3 (L) 78.0 - 100.0 fL   MCH 23.7 (L) 26.0 - 34.0 pg   MCHC 31.0 30.0 - 36.0 g/dL   RDW 18.0 (H) 11.5 - 15.5 %   Platelets 255 150 - 400 K/uL  Basic metabolic panel     Status: Abnormal   Collection Time: 08/16/15  6:01 AM  Result Value Ref Range   Sodium 138 135 - 145 mmol/L   Potassium 3.4 (L) 3.5 - 5.1 mmol/L   Chloride 104 101 - 111 mmol/L   CO2 27 22 - 32 mmol/L   Glucose, Bld 96 65 - 99 mg/dL   BUN 14 6 - 20 mg/dL   Creatinine, Ser 1.23 (H) 0.44 - 1.00 mg/dL   Calcium 9.5 8.9 - 10.3 mg/dL   GFR calc non Af Amer 51 (L) >60 mL/min   GFR calc Af Amer 59 (L) >60 mL/min    Comment: (NOTE) The eGFR has been calculated using the CKD EPI equation. This calculation has not been validated in all clinical situations. eGFR's persistently <60 mL/min signify possible Chronic Kidney Disease.    Anion gap 7 5 - 15     HEENT: trach site closed with scab. Voice still hoarse Cardio: RRR and no murmurs or extra sounds Resp: CTA B/L and unlabored GI: BS positive and non-distended nontender Extremity:  Pulses positive and No Edema Skin:  Intact, dry Neuro: Alert/Oriented with good insight and awareness. Cranial Nerve II-XII normal, Normal Sensory, Abnormal Motor 4- /5 bilateral deltoid, biceps, triceps, grip, hip flexor, knee  extensor, ankle dorsiflexor and plantar flexor and Abnormal FMC Ataxic/ dec FMC persists. Good sitting balance Musc/Skel:  Normal Gen. no acute distress   Assessment/Plan: 1. 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 Physiatrist is providing close team supervision and 24 hour management of active medical problems listed below. Physiatrist and rehab team continue to assess barriers to discharge/monitor patient progress toward functional and medical goals.   FIM: Function - Bathing Position: Shower Body parts bathed by patient: Left arm, Right arm, Chest, Abdomen, Right upper leg, Left upper leg, Front perineal area, Buttocks, Right lower leg, Left lower leg, Back Body parts bathed by helper: Back Bathing not applicable: Front perineal area, Buttocks (Completed earlier with nursing per pt) Assist Level: Supervision or verbal cues Set up : To obtain items  Function- Upper Body Dressing/Undressing What is the patient wearing?: Bra, Pull over shirt/dress Bra - Perfomed by patient: Hook/unhook bra (pull down sports bra), Thread/unthread left bra strap, Thread/unthread right bra strap Bra - Perfomed by helper: Thread/unthread right bra strap, Thread/unthread left bra strap, Hook/unhook  bra (pull down sports bra) Pull over shirt/dress - Perfomed by patient: Thread/unthread left sleeve, Put head through opening, Thread/unthread right sleeve, Pull shirt over trunk Pull over shirt/dress - Perfomed by helper: Thread/unthread right sleeve, Thread/unthread left sleeve, Put head through opening, Pull shirt over trunk Assist Level: Set up Set up : To obtain clothing/put away Function - Lower Body Dressing/Undressing Lower body dressing/undressing activity did not occur: N/A (pt wearing dress today) What is the patient wearing?: Pants,  Socks, Shoes Position: Other (comment) (sit to/from stand from tub transfer bench) Pants- Performed by patient: Thread/unthread right pants leg, Thread/unthread left pants leg, Pull pants up/down Pants- Performed by helper: Thread/unthread right pants leg, Thread/unthread left pants leg, Pull pants up/down Non-skid slipper socks- Performed by patient: Don/doff right sock, Don/doff left sock Non-skid slipper socks- Performed by helper: Don/doff left sock Socks - Performed by patient: Don/doff right sock, Don/doff left sock Shoes - Performed by patient: Don/doff right shoe, Don/doff left shoe, Fasten right, Fasten left Assist for footwear: Setup Assist for lower body dressing: Supervision or verbal cues  Function - Toileting Toileting activity did not occur: No continent bowel/bladder event Toileting steps completed by patient: Adjust clothing prior to toileting Toileting steps completed by helper: Performs perineal hygiene, Adjust clothing after toileting Toileting Assistive Devices: Grab bar or rail Assist level: Touching or steadying assistance (Pt.75%)  Function - Air cabin crew transfer activity did not occur: N/A Toilet transfer assistive device: Grab bar, Walker Assist level to toilet: Supervision or verbal cues Assist level from toilet: Supervision or verbal cues  Function - Chair/bed transfer Chair/bed transfer method: Stand pivot, Ambulatory Chair/bed transfer assist level: Supervision or verbal cues Chair/bed transfer assistive device: Mechanical lift Mechanical lift: Stedy Chair/bed transfer details: Verbal cues for technique  Function - Locomotion: Wheelchair Will patient use wheelchair at discharge?: Yes Type: Manual Max wheelchair distance: 150 Assist Level: No help, No cues, assistive device, takes more than reasonable amount of time Assist Level: No help, No cues, assistive device, takes more than reasonable amount of time Wheel 150 feet activity did not  occur: Safety/medical concerns Assist Level: No help, No cues, assistive device, takes more than reasonable amount of time Turns around,maneuvers to table,bed, and toilet,negotiates 3% grade,maneuvers on rugs and over doorsills: Yes Function - Locomotion: Ambulation Ambulation activity did not occur: Safety/medical concerns (unsafe to attempt due to weakness) Assistive device: Walker-rolling Max distance: 120 (150' with w/c as AD) Assist level: Supervision or verbal cues Assist level: Supervision or verbal cues Walk 50 feet with 2 turns activity did not occur: Safety/medical concerns Assist level: Supervision or verbal cues Walk 150 feet activity did not occur: Safety/medical concerns Assist level: Supervision or verbal cues Walk 10 feet on uneven surfaces activity did not occur: Safety/medical concerns  Function - Comprehension Comprehension: Auditory Comprehension assist level: Follows complex conversation/direction with extra time/assistive device  Function - Expression Expression: Verbal Expression assistive device: Talk trach valve Expression assist level: Expresses basic needs/ideas: With no assist  Function - Social Interaction Social Interaction assist level: Interacts appropriately with others with medication or extra time (anti-anxiety, antidepressant).  Function - Problem Solving Problem solving assist level: Solves basic 90% of the time/requires cueing < 10% of the time  Function - Memory Memory assist level: Recognizes or recalls 90% of the time/requires cueing < 10% of the time Patient normally able to recall (first 3 days only): Current season   Medical Problem List and Plan: 1.  Functional deficits, right hemiparesis and general debility  secondary to hypertensive left parietal and pontine hemorrhages with subsequent complications,in addition has some left hemiparesis  Related to Right frontoparietal subacute infarcts  - Cont CIR PT, OT SLP  -progressing towards  dc 7.7.17 2.  DVT Prophylaxis/Anticoagulation: Pharmaceutical: Lovenox  68m daily indicated 3. Pain Management:    low dose ultram prn effective  -pain generally muscle soreness at this point which has improved also 4. Mood: team to provide ego support. LCSW to follow for evaluation.   5. Neuropsych: This patient is not capable of making decisions on her own behalf. 6. Skin/Wound Care:   Maintain adequate nutrition and hydration.   7. Fluids/Electrolytes/Nutrition: Monitor I/O on current diet.  Check lytes in am. 8. VDRF--resolved. Discussed vocal quality. Given her current volume and voice quality, I expect further improvement. May need outpt ENT assessment 9. HTN: Continue norvasc, lisinopril, apresoline, metoprolol and HCTZ.   reduced HCTZ  Filed Vitals:   08/16/15 0611 08/16/15 0827  BP: 114/63 117/65  Pulse: 65 67  Temp: 98.1 F (36.7 C)   Resp: 18    10 ARF- improving off bactrim and lisinopril    -continue potassium supplement  -renal function continues to normalize BMP Latest Ref Rng 08/16/2015 08/13/2015 08/07/2015  Glucose 65 - 99 mg/dL 96 97 89  BUN 6 - 20 mg/dL 14 18 21(H)  Creatinine 0.44 - 1.00 mg/dL 1.23(H) 1.30(H) 1.45(H)  Sodium 135 - 145 mmol/L 138 137 137  Potassium 3.5 - 5.1 mmol/L 3.4(L) 3.3(L) 3.7  Chloride 101 - 111 mmol/L 104 103 103  CO2 22 - 32 mmol/L 27 26 23   Calcium 8.9 - 10.3 mg/dL 9.5 9.9 10.2    11. ABLA: no clinical signs of bleeding. hgb holding steady.   -hgb holding at 9.4  -Fe++ supp CBC Latest Ref Rng 08/16/2015 08/13/2015 08/08/2015  WBC 4.0 - 10.5 K/uL 5.2 5.0 6.0  Hemoglobin 12.0 - 15.0 g/dL 9.4(L) 9.4(L) 9.7(L)  Hematocrit 36.0 - 46.0 % 30.3(L) 31.2(L) 32.0(L)  Platelets 150 - 400 K/uL 255 290 480(H)      12. Dysphagia: Monitor tolerance of dysphagia 2,thins, 75% meals 13. Abnormal LFT:  basically resolved  14. Anxiety: Monitor on buspar bid.  15. Diarrhea: improved , daily BMs, no incont  16.  Hypoalbuminemia:  Prostat   LOS (Days)  23 A FACE TO FACE EVALUATION WAS PERFORMED  Aivy Akter T 08/16/2015, 9:11 AM

## 2015-08-16 NOTE — Progress Notes (Signed)
Physical Therapy Discharge Summary  Patient Details  Name: Gwendolyn Morales MRN: [id number] Date of Birth: November 29, 1965  Today's Date: 08/16/2015  Patient has met 9 of 9 long term goals due to improved activity tolerance, improved balance, improved postural control, increased strength, increased range of motion, decreased pain, ability to compensate for deficits, functional use of  right upper extremity and right lower extremity, improved attention, improved awareness and improved coordination.  Patient to discharge at an ambulatory level Supervision.   Patient's care partner is independent to provide the necessary physical and cognitive assistance/supervision at discharge.  Reasons goals not met: All goals met  Recommendation:  Patient will benefit from ongoing skilled PT services in outpatient setting to continue to advance safe functional mobility, address ongoing impairments in strength, coordination, balance, activity tolerance, and minimize fall risk.  Equipment: No equipment provided  Reasons for discharge: treatment goals met and discharge from hospital  Patient/family agrees with progress made and goals achieved: Yes  PT Discharge Precautions/Restrictions Precautions Precautions: Fall Restrictions Weight Bearing Restrictions: No Pain Pain Assessment Pain Assessment: No/denies pain Vision/Perception    Ideomotor apraxia Vision WFL Cognition Overall Cognitive Status: Impaired/Different from baseline Arousal/Alertness: Awake/alert Orientation Level: Oriented X4 Attention: Selective Selective Attention: Appears intact Memory: Impaired Memory Impairment: Decreased recall of new information Awareness: Impaired Awareness Impairment: Anticipatory impairment Problem Solving: Impaired Problem Solving Impairment: Functional complex Safety/Judgment: Appears intact Sensation Sensation Light Touch: Appears Intact Stereognosis: Not tested Hot/Cold: Not tested Proprioception:  Appears Intact Coordination Gross Motor Movements are Fluid and Coordinated: No Heel Shin Test: Decreased excursion and speed Motor  Motor Motor: Hemiplegia;Motor apraxia  Mobility Bed Mobility Bed Mobility: Supine to Sit;Sit to Supine Supine to Sit: 5: Supervision Sit to Supine: 5: Supervision Transfers Transfers: Yes Sit to Stand: 5: Supervision Sit to Stand Details: Verbal cues for precautions/safety Stand to Sit: 5: Supervision Stand to Sit Details (indicate cue type and reason): Verbal cues for precautions/safety Stand Pivot Transfers: 5: Supervision Stand Pivot Transfer Details: Verbal cues for precautions/safety Locomotion  Ambulation Ambulation: Yes Ambulation/Gait Assistance: 5: Supervision Ambulation Distance (Feet): 180 Feet Assistive device: Rolling walker Ambulation/Gait Assistance Details: Verbal cues for precautions/safety Gait Gait: Yes Gait Pattern: Left foot flat;Decreased trunk rotation;Poor foot clearance - left;Poor foot clearance - right;Right foot flat;Decreased stride length Gait velocity: 1.02 ft/sec Stairs / Additional Locomotion Stairs: Yes Stairs Assistance: 5: Supervision Stairs Assistance Details: Verbal cues for technique Stair Management Technique: Two rails;Alternating pattern;Forwards Number of Stairs: 12 Height of Stairs: 6 Ramp: 5: Supervision Curb: 5: Supervision Wheelchair Mobility Wheelchair Mobility: No (Pt ambulatory)  Trunk/Postural Assessment  Cervical Assessment Cervical Assessment: Within Functional Limits Thoracic Assessment Thoracic Assessment: Within Functional Limits Lumbar Assessment Lumbar Assessment: Within Functional Limits Postural Control Postural Control: Deficits on evaluation Righting Reactions: WFL Protective Responses: delayed and inadequate without UE support  Balance Balance Balance Assessed: Yes Standardized Balance Assessment Standardized Balance Assessment: Berg Balance Test Berg Balance  Test Sit to Stand: Able to stand  independently using hands Standing Unsupported: Able to stand 2 minutes with supervision Sitting with Back Unsupported but Feet Supported on Floor or Stool: Able to sit safely and securely 2 minutes Stand to Sit: Controls descent by using hands Transfers: Able to transfer with verbal cueing and /or supervision Standing Unsupported with Eyes Closed: Able to stand 10 seconds with supervision Standing Ubsupported with Feet Together: Needs help to attain position but able to stand for 30 seconds with feet together From Standing, Reach Forward with Outstretched Arm: Reaches forward but needs  supervision From Standing Position, Pick up Object from Floor: Able to pick up shoe, needs supervision From Standing Position, Turn to Look Behind Over each Shoulder: Turn sideways only but maintains balance Turn 360 Degrees: Needs close supervision or verbal cueing Standing Unsupported, Alternately Place Feet on Step/Stool: Needs assistance to keep from falling or unable to try Standing Unsupported, One Foot in Front: Able to take small step independently and hold 30 seconds Standing on One Leg: Unable to try or needs assist to prevent fall Total Score: 28 Static Sitting Balance Static Sitting - Level of Assistance: 6: Modified independent (Device/Increase time) Dynamic Sitting Balance Dynamic Sitting - Level of Assistance: 6: Modified independent (Device/Increase time) Static Standing Balance Static Standing - Level of Assistance: 5: Stand by assistance Dynamic Standing Balance Dynamic Standing - Level of Assistance: 5: Stand by assistance Extremity Assessment  RUE Assessment RUE Assessment: Exceptions to [facility] (ROM WFL, shoulder flexion grossly 3+/5, elbow 4/5) LUE Assessment LUE Assessment: Exceptions to WFL (ROM WFL, strength grossly 4/5 overall) RLE Assessment RLE Assessment: Exceptions to [facility] RLE Strength RLE Overall Strength Comments: grossly 4-/5 throughout RLE  Tone RLE Tone Comments: one beat ankle clonus LLE Assessment LLE Assessment: Exceptions to WFL LLE Strength LLE Overall Strength Comments: grossly 4-/5 throughout   See Function Navigator for Current Functional Status.  Benjiman Core Tygielski 08/16/2015, 1:33 PM

## 2015-08-16 NOTE — Progress Notes (Deleted)
Physical Therapy Discharge Summary  Patient Details  Name: Gwendolyn Morales MRN: [id number] Date of Birth: Aug 02, 1965  Today's Date: 08/16/2015 PT Individual Time: 1300-1400 PT Individual Time Calculation (min): 60 min    Patient has met 9 of 9 long term goals due to improved activity tolerance, improved balance, improved postural control, increased strength, increased range of motion, decreased pain, ability to compensate for deficits, functional use of  right upper extremity and right lower extremity, improved attention, improved awareness and improved coordination.  Patient to discharge at an ambulatory level Supervision.   Patient's care partner is independent to provide the necessary physical and cognitive assistance/supervision at discharge.  Reasons goals not met: All goals met  Recommendation:  Patient will benefit from ongoing skilled PT services in outpatient setting to continue to advance safe functional mobility, address ongoing impairments in strength, coordination, balance, activity tolerance, and minimize fall risk.  Equipment: No equipment provided  Reasons for discharge: treatment goals met and discharge from hospital  Patient/family agrees with progress made and goals achieved: Yes  PT Discharge Precautions/Restrictions Precautions Precautions: Fall Restrictions Weight Bearing Restrictions: No Pain Pain Assessment Pain Assessment: No/denies pain Vision/Perception    Ideomotor apraxia Vision WFL Cognition Overall Cognitive Status: Impaired/Different from baseline Arousal/Alertness: Awake/alert Orientation Level: Oriented X4 Attention: Selective Selective Attention: Appears intact Memory: Impaired Memory Impairment: Decreased recall of new information Awareness: Impaired Awareness Impairment: Anticipatory impairment Problem Solving: Impaired Problem Solving Impairment: Functional complex Safety/Judgment: Appears intact Sensation Sensation Light Touch:  Appears Intact Stereognosis: Not tested Hot/Cold: Not tested Proprioception: Appears Intact Coordination Gross Motor Movements are Fluid and Coordinated: No Heel Shin Test: Decreased excursion and speed Motor  Motor Motor: Hemiplegia;Motor apraxia  Mobility Bed Mobility Bed Mobility: Supine to Sit;Sit to Supine Supine to Sit: 5: Supervision Sit to Supine: 5: Supervision Transfers Transfers: Yes Sit to Stand: 5: Supervision Sit to Stand Details: Verbal cues for precautions/safety Stand to Sit: 5: Supervision Stand to Sit Details (indicate cue type and reason): Verbal cues for precautions/safety Stand Pivot Transfers: 5: Supervision Stand Pivot Transfer Details: Verbal cues for precautions/safety Locomotion  Ambulation Ambulation: Yes Ambulation/Gait Assistance: 5: Supervision Ambulation Distance (Feet): 180 Feet Assistive device: Rolling walker Ambulation/Gait Assistance Details: Verbal cues for precautions/safety Gait Gait: Yes Gait Pattern: Left foot flat;Decreased trunk rotation;Poor foot clearance - left;Poor foot clearance - right;Right foot flat;Decreased stride length Stairs / Additional Locomotion Stairs: Yes Stairs Assistance: 5: Supervision Stairs Assistance Details: Verbal cues for technique Stair Management Technique: Two rails;Alternating pattern;Forwards Number of Stairs: 12 Height of Stairs: 6 Ramp: 5: Supervision Curb: 5: Supervision Wheelchair Mobility Wheelchair Mobility: No (Pt ambulatory)  Trunk/Postural Assessment  Cervical Assessment Cervical Assessment: Within Functional Limits Thoracic Assessment Thoracic Assessment: Within Functional Limits Lumbar Assessment Lumbar Assessment: Within Functional Limits Postural Control Postural Control: Deficits on evaluation Righting Reactions: WFL Protective Responses: delayed and inadequate without UE support  Balance Balance Balance Assessed: Yes Standardized Balance Assessment Standardized Balance  Assessment: Berg Balance Test Berg Balance Test Sit to Stand: Able to stand  independently using hands Standing Unsupported: Able to stand 2 minutes with supervision Sitting with Back Unsupported but Feet Supported on Floor or Stool: Able to sit safely and securely 2 minutes Stand to Sit: Controls descent by using hands Transfers: Able to transfer with verbal cueing and /or supervision Standing Unsupported with Eyes Closed: Able to stand 10 seconds with supervision Standing Ubsupported with Feet Together: Needs help to attain position but able to stand for 30 seconds with feet together From Standing,  Reach Forward with Outstretched Arm: Reaches forward but needs supervision From Standing Position, Pick up Object from Floor: Able to pick up shoe, needs supervision From Standing Position, Turn to Look Behind Over each Shoulder: Turn sideways only but maintains balance Turn 360 Degrees: Needs close supervision or verbal cueing Standing Unsupported, Alternately Place Feet on Step/Stool: Needs assistance to keep from falling or unable to try Standing Unsupported, One Foot in Front: Able to take small step independently and hold 30 seconds Standing on One Leg: Unable to try or needs assist to prevent fall Total Score: 28 Static Sitting Balance Static Sitting - Level of Assistance: 6: Modified independent (Device/Increase time) Dynamic Sitting Balance Dynamic Sitting - Level of Assistance: 6: Modified independent (Device/Increase time) Static Standing Balance Static Standing - Level of Assistance: 5: Stand by assistance Dynamic Standing Balance Dynamic Standing - Level of Assistance: 5: Stand by assistance Extremity Assessment  RUE Assessment RUE Assessment: Exceptions to [facility] (ROM WFL, shoulder flexion grossly 3+/5, elbow 4/5) LUE Assessment LUE Assessment: Exceptions to WFL (ROM WFL, strength grossly 4/5 overall) RLE Assessment RLE Assessment: Exceptions to [facility] RLE Strength RLE Overall  Strength Comments: grossly 4-/5 throughout RLE Tone RLE Tone Comments: one beat ankle clonus LLE Assessment LLE Assessment: Exceptions to [facility] LLE Strength LLE Overall Strength Comments: grossly 4-/5 throughout  Skilled Therapeutic Intervention: Pt received seated in w/c, denies pain and agreeable to treatment. Assessed all mobility as described above with S overall with RW for mobility. Patient demonstrates increased fall risk as noted by score of   28/56 on Berg Balance Scale.  (<36= high risk for falls, close to 100%; 37-45 significant >80%; 46-51 moderate >50%; 52-55 lower >25%); educated pt on high falls risk and recommended continued use of RW until otherwise directed by outpatient therapist. Performed standing alternating toe taps to numbered targets on level floor for balance and coordination, weight shifting and SLS balance; BUE support initially reduced to one UE. Nustep x10 min with BUE/BLE level 5 for strengthening and aerobic endurance. Returned to room with gait using RW and S; remained seated in w/c at completion of session, all needs in reach.    See Function Navigator for Current Functional Status.  Benjiman Core Tygielski 08/16/2015, 1:33 PM

## 2015-08-16 NOTE — Progress Notes (Signed)
Occupational Therapy Session Note  Patient Details  Name: Gwendolyn Morales MRN: [id number] Date of Birth: Nov 17, 1965  Today's Date: 08/16/2015 OT Individual Time: 1324-40100830-0930 OT Individual Time Calculation (min): 60 min    Short Term Goals: Week 3:  OT Short Term Goal 1 (Week 3): STGs = LTGs  Skilled Therapeutic Interventions/Progress Updates:    Completed ADL retraining at overall supervision/setup level.  Pt ambulated to room shower with RW and supervision, completing shower transfer with use of tub bench with supervision.  Bathing completed without assist with lateral leans to wash buttocks and use of reacher to wash BLE.  Pt donned underwear seated on tub bench with increased time to cross legs over opposite knee to thread underwear, then completed dressing tasks at sit > stand level from w/c.  Grooming tasks completed seated without assist.  Issued pt walker bag to increase safety with transporting items.  Pt reports no further questions and ready for d/c home tomorrow.  Therapy Documentation Precautions:  Precautions Precautions: Fall Precaution Comments: trach plugged for therapy Restrictions Weight Bearing Restrictions: No General:   Vital Signs: Therapy Vitals Temp: 98.1 F (36.7 C) Temp Source: Oral Pulse Rate: 67 Resp: 18 BP: 117/65 mmHg Patient Position (if appropriate): Sitting Oxygen Therapy SpO2: 97 % O2 Device: Not Delivered Pain: Pain Assessment Pain Assessment: No/denies pain  See Function Navigator for Current Functional Status.   Therapy/Group: Individual Therapy  Rosalio LoudHOXIE, Jamarii Banks 08/16/2015, 9:28 AM

## 2015-08-16 NOTE — Progress Notes (Signed)
Occupational Therapy Discharge Summary  Patient Details  Name: Gwendolyn Morales MRN: [id number] Date of Birth: Feb 21, 1965  Patient has met 49 of 63 long term goals due to improved activity tolerance, improved balance, postural control, ability to compensate for deficits, improved attention, improved awareness and improved coordination.  Patient to discharge at overall Supervision level.  Patient's care partner is independent to provide the necessary physical and cognitive assistance at discharge.    Reasons goals not met: N/A  Recommendation:  Patient will benefit from ongoing skilled OT services in outpatient setting to continue to advance functional skills in the area of BADL, iADL and Reduce care partner burden.  Equipment: No equipment provided  Reasons for discharge: treatment goals met and discharge from hospital  Patient/family agrees with progress made and goals achieved: Yes  OT Discharge Precautions/Restrictions  Precautions Precautions: Fall General   Vital Signs Therapy Vitals Pulse Rate: 67 BP: 117/65 mmHg Pain Pain Assessment Pain Assessment: No/denies pain ADL  See Function Navigator Vision/Perception  Vision- History Baseline Vision/History: No visual deficits Patient Visual Report: No change from baseline Vision- Assessment Vision Assessment?: Yes Eye Alignment: Within Functional Limits Ocular Range of Motion: Within Functional Limits Alignment/Gaze Preference: Within Defined Limits Saccades: Within functional limits Convergence: Within functional limits Visual Fields: No apparent deficits  Cognition Overall Cognitive Status: Impaired/Different from baseline Arousal/Alertness: Awake/alert Orientation Level: Oriented X4 Attention: Selective Selective Attention: Appears intact Selective Attention Impairment: Functional basic;Verbal basic Memory: Impaired Memory Impairment: Decreased recall of new information Awareness: Impaired Awareness  Impairment: Emergent impairment;Anticipatory impairment Problem Solving: Impaired Safety/Judgment: Appears intact Sensation Sensation Light Touch: Appears Intact Stereognosis: Not tested Hot/Cold: Not tested Proprioception: Appears Intact Coordination Gross Motor Movements are Fluid and Coordinated: No Fine Motor Movements are Fluid and Coordinated: No Finger Nose Finger Test: slight dysmetria bilaterally with Lt>Rt Extremity/Trunk Assessment RUE Assessment RUE Assessment: Exceptions to WFL (ROM WFL, shoulder flexion grossly 3+/5, elbow 4/5) LUE Assessment LUE Assessment: Exceptions to WFL (ROM WFL, strength grossly 4/5 overall)   See Function Navigator for Current Functional Status.  Simonne Come 08/16/2015, 12:13 PM

## 2015-08-16 NOTE — Progress Notes (Addendum)
Speech Language Pathology Discharge Summary  Patient Details  Name: Gwendolyn Morales MRN: [id number] Date of Birth: [birth year]  Today's Date: 08/16/2015 SLP Individual Time: 1005-1100 SLP Individual Time Calculation (min): 55 min   Skilled Therapeutic Interventions:  Pt was seen for skilled ST targeting goals for dysphagia and communication.  Pt consumed a functional snack of regular textures to assess toleration of diet upgrade.  Pt consumed regular textures and thin liquids with mod I use of swallowing precautions and was able to clear solids from the oral cavity completely without difficulty.  No overt s/s of aspiration was evident with solids or liquids.  Pt reports good toleration of diet since upgrade.  Therapist also facilitated the session with a barrier task targeting intelligibility at the conversational level.  Pt was >90% intelligible during task with mod I even with incorporated background noise.  Pt was returned to room and left in wheelchair with call bell within reach.  Pt is ready for discharge tomorrow.       Patient has met 7 of 7 long term goals.  Patient to discharge at overall Modified Independent;Supervision level.  Reasons goals not met: n/a   Clinical Impression/Discharge Summary:  Pt made excellent functional gains while inpatient and is discharging having met 7 out of 7 long term goals.  Pt's diet has been upgraded to regular textures and thin liquids which she is now consuming with mod I use of swallowing precautions.  Pt is also overall supervision level assist for intelligibility at the conversational level due to decreased vocal intensity, rapid rate of speech, and imprecise articulation of consonants.  Pt occasionally requires supervision verbal cues for higher level word finding but is overall mod I for functional communication.  Pt is also an overall supervision level assist for semi-complex tasks due to mild cognitive impairment for recall of new information and  functional problem solving.  Pt and family education is complete at this time.  Pt is discharging home with 24/7 supervision from family.  Pt would continue to benefit from follow up ST services at next level of care to continue to address cognitive-linguistic goals.    Care Partner:  Caregiver Able to Provide Assistance: Yes  Type of Caregiver Assistance: Physical;Cognitive  Recommendation:  24 hour supervision/assistance;Home Health SLP;Outpatient SLP  Rationale for SLP Follow Up: Maximize cognitive function and independence;Maximize functional communication   Equipment: none recommended by SLP    Reasons for discharge: Discharged from hospital   Patient/Family Agrees with Progress Made and Goals Achieved: Yes   Function:  Eating Eating   Modified Consistency Diet: No Eating Assist Level: Swallowing techniques: self managed           Cognition Comprehension Comprehension assist level: Follows complex conversation/direction with extra time/assistive device  Expression   Expression assist level: Expresses complex 90% of the time/cues < 10% of the time  Social Interaction Social Interaction assist level: Interacts appropriately with others with medication or extra time (anti-anxiety, antidepressant).  Problem Solving Problem solving assist level: Solves basic problems with no assist  Memory Memory assist level: Recognizes or recalls 90% of the time/requires cueing < 10% of the time   Windell Moulding L 08/16/2015, 12:40 PM

## 2015-08-16 NOTE — Progress Notes (Signed)
Physical Therapy Session Note  Patient Details  Name: Gwendolyn Morales MRN: [id number] Date of Birth: 1966-01-23  Today's Date: 08/16/2015 PT Individual Time: 5217-4715 PT Individual Time Calculation (min): 29 min   Short Term Goals: Week 3:  PT Short Term Goal 1 (Week 3): pt will gait in controlled environment 100' with min A PT Short Term Goal 1 - Progress (Week 3): Met PT Short Term Goal 2 (Week 3): Pt will perform bed mobility with min A PT Short Term Goal 2 - Progress (Week 3): Met PT Short Term Goal 3 (Week 3): Pt will negotiate 4 steps min A with 2 handrails PT Short Term Goal 3 - Progress (Week 3): Progressing toward goal  Skilled Therapeutic Interventions/Progress Updates:    Patient received sitting in WC and agreeable to PT.  Patient instructed patient in Gait training with RW for 111f with supervision A with min cues for improved erect posture. Patient noted to have improved posture following cues.  PT instructed patient in standing balance/talerance with Wii training playing tennis for 4 games and 1 game of bowling. Patient able to perform all wii games with supervision A from PT for improved safety and no UE support. Patient noted to use ankle strategy for 2 near LOB and able to self correct.   [facility]   Therapy Documentation  See Function Navigator for Current Functional Status.   Therapy/Group: Individual Therapy  ALorie Phenix7/07/2015, 7:07 PM

## 2015-08-16 NOTE — Progress Notes (Signed)
Physical Therapy Session Note  Patient Details  Name: Gwendolyn SimonSherry Morales MRN: [id number] Date of Birth: [birth year]  Today's Date: 08/16/2015 PT Individual Time: 1300-1400 PT Individual Time Calculation (min): 60 min    Skilled Therapeutic Interventions/Progress Updates:    Pt received seated in w/c, denies pain and agreeable to treatment. Assessed all mobility as described above with S overall with RW for mobility. Patient demonstrates increased fall risk as noted by score of 28/56 on Berg Balance Scale. (<36= high risk for falls, close to 100%; 37-45 significant >80%; 46-51 moderate >50%; 52-55 lower >25%); educated pt on high falls risk and recommended continued use of RW until otherwise directed by outpatient therapist. Performed standing alternating toe taps to numbered targets on level floor for balance and coordination, weight shifting and SLS balance; BUE support initially reduced to one UE. Nustep x10 min with BUE/BLE level 5 for strengthening and aerobic endurance. Returned to room with gait using RW and S; remained seated in w/c at completion of session, all needs in reach.     See Function Navigator for Current Functional Status.   Therapy/Group: Individual Therapy  Vista Lawmanlizabeth J Tygielski 08/16/2015, 3:49 PM

## 2015-08-17 DIAGNOSIS — I10 Essential (primary) hypertension: Secondary | ICD-10-CM

## 2015-08-17 MED ORDER — FERROUS SULFATE 325 (65 FE) MG PO TABS: 325.0000 mg | ORAL_TABLET | Freq: Every day | ORAL | Status: DC

## 2015-08-17 MED ORDER — HYDRALAZINE HCL 50 MG PO TABS: 50.0000 mg | ORAL_TABLET | Freq: Three times a day (TID) | ORAL | Status: DC

## 2015-08-17 MED ORDER — BUSPIRONE HCL 7.5 MG PO TABS: 7.5000 mg | ORAL_TABLET | Freq: Two times a day (BID) | ORAL | Status: DC

## 2015-08-17 MED ORDER — HYDROCHLOROTHIAZIDE 12.5 MG PO CAPS: 12.5000 mg | ORAL_CAPSULE | Freq: Every day | ORAL | Status: DC

## 2015-08-17 MED ORDER — METOPROLOL TARTRATE 100 MG PO TABS: 100.0000 mg | ORAL_TABLET | Freq: Two times a day (BID) | ORAL | Status: DC

## 2015-08-17 MED ORDER — POTASSIUM CHLORIDE CRYS ER 20 MEQ PO TBCR: 20.0000 meq | EXTENDED_RELEASE_TABLET | Freq: Every day | ORAL | Status: DC

## 2015-08-17 MED ORDER — AMLODIPINE BESYLATE 10 MG PO TABS: 10.0000 mg | ORAL_TABLET | Freq: Every day | ORAL | Status: DC

## 2015-08-17 MED ORDER — SENNOSIDES-DOCUSATE SODIUM 8.6-50 MG PO TABS: 2.0000 | ORAL_TABLET | Freq: Two times a day (BID) | ORAL | Status: DC

## 2015-08-17 NOTE — Discharge Instructions (Signed)
Inpatient Rehab Discharge Instructions  Jerre SimonSherry Staff Discharge date and time:  08/17/15  Activities/Precautions/ Functional Status: Activity: no lifting, driving, or strenuous exercise  till cleared by MD.  Diet: cardiac diet Needs to tuck her chin with liquids. Wound Care: none needed   Functional status:  ___ No restrictions     ___ Walk up steps independently _X__ 24/7 supervision/assistance   ___ Walk up steps with assistance ___ Intermittent supervision/assistance  ___ Bathe/dress independently ___ Walk with walker     _X_ Bathe/dress with assistance ___ Walk Independently    ___ Shower independently _X__ Walk with supervision.     ___ Shower with assistance _X__ No alcohol     ___ Return to work/school ________  Special Instructions:    COMMUNITY REFERRALS UPON DISCHARGE:    Home Health:   PT, OT, SP, RN ,SW  Agency:[facility] Phone:[phone]   Date of last service:08/17/2015  Medical Equipment/Items Ordered:[facility]-WHEELCHAIR & TUB BENCH   Other:HUSBAND TO WORK ON GETTING INTO [facility]-HAS INFORMATION ON DISABILITY AND MEDICAID APPLICATIONS PENDING-HUSBAND TO FOLLOW UP WITH MATCH-MEDICATION ASSISTANCE PROGRAM   GENERAL COMMUNITY RESOURCES FOR PATIENT/FAMILY: Support Groups:[facility] SUPPORT GROUP EVERY SECOND Thursday @ 3:00-4:00 PM ON THE REHAB UNIT QUESTIONS CONTACT KATIE [phone]   STROKE/TIA DISCHARGE INSTRUCTIONS SMOKING Cigarette smoking nearly doubles your risk of having a stroke & is the single most alterable risk factor  If you smoke or have smoked in the last 12 months, you are advised to quit smoking for your health.  Most of the excess cardiovascular risk related to smoking disappears within a year of stopping.  Ask you doctor about anti-smoking medications  [place] Quit Line: 1-800-QUIT NOW  Free Smoking Cessation Classes [phone]  CHOLESTEROL Know your levels; limit fat & cholesterol in your diet  Lipid Panel       Component Value Date/Time   CHOL 187 07/13/2015 1057   TRIG 525* 07/22/2015 1005   HDL 60 07/13/2015 1057   CHOLHDL 3.1 07/13/2015 1057   VLDL 20 07/13/2015 1057   LDLCALC 107* 07/13/2015 1057      Many patients benefit from treatment even if their cholesterol is at goal.  Goal: Total Cholesterol (CHOL) less than 160  Goal:  Triglycerides (TRIG) less than 150  Goal:  HDL greater than 40  Goal:  LDL (LDLCALC) less than 100   BLOOD PRESSURE American Stroke Association blood pressure target is less that 120/80 mm/Hg  Your discharge blood pressure is:  BP: 117/65 mmHg  Monitor your blood pressure  Limit your salt and alcohol intake  Many individuals will require more than one medication for high blood pressure  DIABETES (A1c is a blood sugar average for last 3 months) Goal HGBA1c is under 7% (HBGA1c is blood sugar average for last 3 months)  Diabetes: No known diagnosis of diabetes    Lab Results  Component Value Date   HGBA1C 5.5 07/13/2015     Your HGBA1c can be lowered with medications, healthy diet, and exercise.  Check your blood sugar as directed by your physician  Call your physician if you experience unexplained or low blood sugars.  PHYSICAL ACTIVITY/REHABILITATION Goal is 30 minutes at least 4 days per week  Activity: No driving, Therapies: See aboe Return to work: to be decided on follow up.   Activity decreases your risk of heart attack and stroke and makes your heart stronger.  It helps control your weight and blood pressure; helps you relax and can improve your mood.  Participate in a regular exercise program.  Talk with your doctor about the best form of exercise for you (dancing, walking, swimming, cycling).  DIET/WEIGHT Goal is to maintain a healthy weight  Your discharge diet is: Diet regular Room service appropriate?: Yes; Fluid consistency:: Thin liquids Your height is:  Height: 5\' 6"  (167.6 cm) Your current weight is: Weight: 105.2 kg (231 lb  14.8 oz) Your Body Mass Index (BMI) is:  BMI (Calculated): 37.5  Following the type of diet specifically designed for you will help prevent another stroke.  Your goal weight is:  155 lbs  Your goal Body Mass Index (BMI) is 19-24.  Healthy food habits can help reduce 3 risk factors for stroke:  High cholesterol, hypertension, and excess weight.  RESOURCES Stroke/Support Group:  Call [phone]   STROKE EDUCATION PROVIDED/REVIEWED AND GIVEN TO PATIENT Stroke warning signs and symptoms How to activate emergency medical system (call 911). Medications prescribed at discharge. Need for follow-up after discharge. Personal risk factors for stroke. Pneumonia vaccine given:  Flu vaccine given:  My questions have been answered, the writing is legible, and I understand these instructions.  I will adhere to these goals & educational materials that have been provided to me after my discharge from the hospital.     My questions have been answered and I understand these instructions. I will adhere to these goals and the provided educational materials after my discharge from the hospital.  Patient/Caregiver Signature _______________________________ Date __________  Clinician Signature _______________________________________ Date __________  Please bring this form and your medication list with you to all your follow-up doctor's appointments.

## 2015-08-17 NOTE — Progress Notes (Signed)
Subjective/Complaints: Pt sitting up in her chair.  She is ready to be discharged.    Review of systems negative for shortness of breath, chest pain, nausea vomiting.   Objective: Vital Signs: Blood pressure 113/60, pulse 72, temperature 98.5 F (36.9 C), temperature source Oral, resp. rate 18, height '5\' 6"'$  (1.676 m), weight 105.2 kg (231 lb 14.8 oz), SpO2 100 %. No results found. Results for orders placed or performed during the hospital encounter of 07/24/15 (from the past 72 hour(s))  CBC     Status: Abnormal   Collection Time: 08/16/15  6:01 AM  Result Value Ref Range   WBC 5.2 4.0 - 10.5 K/uL   RBC 3.97 3.87 - 5.11 MIL/uL   Hemoglobin 9.4 (L) 12.0 - 15.0 g/dL   HCT 30.3 (L) 36.0 - 46.0 %   MCV 76.3 (L) 78.0 - 100.0 fL   MCH 23.7 (L) 26.0 - 34.0 pg   MCHC 31.0 30.0 - 36.0 g/dL   RDW 18.0 (H) 11.5 - 15.5 %   Platelets 255 150 - 400 K/uL  Basic metabolic panel     Status: Abnormal   Collection Time: 08/16/15  6:01 AM  Result Value Ref Range   Sodium 138 135 - 145 mmol/L   Potassium 3.4 (L) 3.5 - 5.1 mmol/L   Chloride 104 101 - 111 mmol/L   CO2 27 22 - 32 mmol/L   Glucose, Bld 96 65 - 99 mg/dL   BUN 14 6 - 20 mg/dL   Creatinine, Ser 1.23 (H) 0.44 - 1.00 mg/dL   Calcium 9.5 8.9 - 10.3 mg/dL   GFR calc non Af Amer 51 (L) >60 mL/min   GFR calc Af Amer 59 (L) >60 mL/min    Comment: (NOTE) The eGFR has been calculated using the CKD EPI equation. This calculation has not been validated in all clinical situations. eGFR's persistently <60 mL/min signify possible Chronic Kidney Disease.    Anion gap 7 5 - 15     HEENT: trach site closed with scab. Voice still hoarse Cardio: RRR and no murmurs or extra sounds Resp: CTA B/L and unlabored GI: BS positive and non-distended nontender Skin:  Intact, dry. Neuro: Alert/Oriented with good insight and awareness.  Motor B/l UE: 4/5 deltoid, biceps, triceps, grip RLE: 4/5  hip flexor, knee extensor, ankle dorsiflexor and plantar  flexor  LLE: 4+/5  hip flexor, knee extensor, ankle dorsiflexor and plantar flexor  Good sitting balance Musc/Skel:  No edema.  No tenderness. Gen. no acute distress. Vital signs reviewed.   Assessment/Plan: 1. 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 Physiatrist is providing close team supervision and 24 hour management of active medical problems listed below. Physiatrist and rehab team continue to assess barriers to discharge/monitor patient progress toward functional and medical goals.   FIM: Function - Bathing Position: Shower Body parts bathed by patient: Left arm, Right arm, Chest, Abdomen, Right upper leg, Left upper leg, Front perineal area, Buttocks, Right lower leg, Left lower leg, Back Body parts bathed by helper: Back Bathing not applicable: Front perineal area, Buttocks (Completed earlier with nursing per pt) Assist Level: Set up Set up : To obtain items  Function- Upper Body Dressing/Undressing What is the patient wearing?: Bra, Pull over shirt/dress Bra - Perfomed by patient: Hook/unhook bra (pull down sports bra), Thread/unthread left bra strap, Thread/unthread right bra strap  Bra - Perfomed by helper: Thread/unthread right bra strap, Thread/unthread left bra strap, Hook/unhook bra (pull down sports bra) Pull over shirt/dress - Perfomed by patient: Thread/unthread left sleeve, Put head through opening, Thread/unthread right sleeve, Pull shirt over trunk Pull over shirt/dress - Perfomed by helper: Thread/unthread right sleeve, Thread/unthread left sleeve, Put head through opening, Pull shirt over trunk Assist Level: Set up Set up : To obtain clothing/put away Function - Lower Body Dressing/Undressing Lower body dressing/undressing activity did not occur: N/A (pt wearing dress today) What is the  patient wearing?: Pants, Socks, Shoes, Underwear Position: Other (comment) (sit > stand from tub bench) Underwear - Performed by patient: Thread/unthread right underwear leg, Thread/unthread left underwear leg, Pull underwear up/down Pants- Performed by patient: Thread/unthread right pants leg, Thread/unthread left pants leg, Pull pants up/down Pants- Performed by helper: Thread/unthread right pants leg, Thread/unthread left pants leg, Pull pants up/down Non-skid slipper socks- Performed by patient: Don/doff right sock, Don/doff left sock Non-skid slipper socks- Performed by helper: Don/doff left sock Socks - Performed by patient: Don/doff right sock, Don/doff left sock Shoes - Performed by patient: Don/doff right shoe, Don/doff left shoe, Fasten right, Fasten left Assist for footwear: Setup Assist for lower body dressing: Set up  Function - Toileting Toileting activity did not occur: No continent bowel/bladder event Toileting steps completed by patient: Adjust clothing prior to toileting Toileting steps completed by helper: Performs perineal hygiene, Adjust clothing after toileting Toileting Assistive Devices: Grab bar or rail Assist level: Touching or steadying assistance (Pt.75%)  Function - Air cabin crew transfer activity did not occur: N/A Toilet transfer assistive device: Grab bar, Walker Assist level to toilet: Supervision or verbal cues Assist level from toilet: Supervision or verbal cues  Function - Chair/bed transfer Chair/bed transfer method: Stand pivot, Ambulatory Chair/bed transfer assist level: Supervision or verbal cues Chair/bed transfer assistive device: Environmental manager lift: Stedy Chair/bed transfer details: Verbal cues for technique  Function - Locomotion: Wheelchair Will patient use wheelchair at discharge?: No Type: Manual Max wheelchair distance: 150 Assist Level: No help, No cues, assistive device, takes more than reasonable amount of  time Assist Level: No help, No cues, assistive device, takes more than reasonable amount of time Wheel 150 feet activity did not occur: Safety/medical concerns Assist Level: No help, No cues, assistive device, takes more than reasonable amount of time Turns around,maneuvers to table,bed, and toilet,negotiates 3% grade,maneuvers on rugs and over doorsills: Yes Function - Locomotion: Ambulation Ambulation activity did not occur: Safety/medical concerns (unsafe to attempt due to weakness) Assistive device: Walker-rolling Max distance: 180 Assist level: Supervision or verbal cues Assist level: Supervision or verbal cues Walk 50 feet with 2 turns activity did not occur: Safety/medical concerns Assist level: Supervision or verbal cues Walk 150 feet activity did not occur: Safety/medical concerns Assist level: Supervision or verbal cues Walk 10 feet on uneven surfaces activity did not occur: Safety/medical concerns Assist level: Supervision or verbal cues  Function - Comprehension Comprehension: Auditory Comprehension assist level: Follows complex conversation/direction with extra time/assistive device  Function - Expression Expression: Verbal Expression assistive device: Talk trach valve Expression assist level: Expresses basic needs/ideas: With no assist  Function - Social Interaction Social Interaction assist level: Interacts appropriately with others with medication or extra time (anti-anxiety, antidepressant).  Function - Problem Solving Problem solving assist level: Solves basic 90% of the time/requires cueing < 10% of the time  Function - Memory Memory assist level: Recognizes or recalls 90% of the time/requires cueing < 10% of the  time Patient normally able to recall (first 3 days only): Current season   Medical Problem List and Plan: 1.  Functional deficits, right hemiparesis and general debility secondary to hypertensive left parietal and pontine hemorrhages with subsequent  complications,in addition has some left hemiparesis  Related to Right frontoparietal subacute infarcts  - D/c today 2.  DVT Prophylaxis/Anticoagulation: Pharmaceutical: Lovenox  64m daily indicated 3. Pain Management:    low dose ultram prn effective 4. Mood: team to provide ego support. LCSW to follow for evaluation.   5. Neuropsych: This patient is not capable of making decisions on her own behalf. 6. Skin/Wound Care:   Maintain adequate nutrition and hydration.   7. Fluids/Electrolytes/Nutrition: Monitor I/O on current diet. 8. VDRF--resolved. May need outpt ENT assessment, although seems unlikely at present 9. HTN: Continue meds  Filed Vitals:   08/17/15 0529 08/17/15 0811  BP: 109/56 113/60  Pulse: 68 72  Temp: 98.5 F (36.9 C)   Resp: 18    10 ARF- improving off bactrim and lisinopril    -continue potassium supplement  -renal function improving  BMP Latest Ref Rng 08/16/2015 08/13/2015 08/07/2015  Glucose 65 - 99 mg/dL 96 97 89  BUN 6 - 20 mg/dL 14 18 21(H)  Creatinine 0.44 - 1.00 mg/dL 1.23(H) 1.30(H) 1.45(H)  Sodium 135 - 145 mmol/L 138 137 137  Potassium 3.5 - 5.1 mmol/L 3.4(L) 3.3(L) 3.7  Chloride 101 - 111 mmol/L 104 103 103  CO2 22 - 32 mmol/L _0 Calcium 8.9 - 10.3 mg/dL 9.5 9.9 10.2  11. ABLA: no clinical signs of bleeding.   -hgb at 9.4 on 7/6  -Fe++ supp CBC Latest Ref Rng 08/16/2015 08/13/2015 08/08/2015  WBC 4.0 - 10.5 K/uL 5.2 5.0 6.0  Hemoglobin 12.0 - 15.0 g/dL 9.4(L) 9.4(L) 9.7(L)  Hematocrit 36.0 - 46.0 % 30.3(L) 31.2(L) 32.0(L)  Platelets 150 - 400 K/uL 255 290 480(H)    12. Dysphagia: Resolved. Regular diet, eating 100% 13. Abnormal LFT:  basically resolved  14. Anxiety: Monitor on buspar bid.  15. Diarrhea: Resolved  16. Hypoalbuminemia:  Prostat   LOS (Days) 24 A FACE TO FACE EVALUATION WAS PERFORMED  Ankit ALorie Phenix7/08/2015, 10:19 AM

## 2015-08-17 NOTE — Progress Notes (Signed)
Social Work Patient ID: Gwendolyn SimonSherry Morales, female   DOB: [birth year], 50 y.o.   MRN: [id number] Spoke with husband who reports the tub seat is broken and wheelchair seat is dry rotted, so will need to get both of the  Those pieces of equipment before discharge. [facility]. He will be here around 2:00 to transport pt home.

## 2015-08-17 NOTE — Progress Notes (Signed)
Patient discharged to home, accompanied by her husband. 

## 2015-08-17 NOTE — Discharge Summary (Signed)
Physician Discharge Summary  Patient ID: Gwendolyn Morales MRN: [id number] DOB/AGE: 49/49/1967 49 y.o.  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    Significant Diagnostic Studies: Dg Chest Port 1 View  07/23/2015  CLINICAL DATA:  Productive cough. EXAM: PORTABLE CHEST 1 VIEW COMPARISON:  July 21, 2015. FINDINGS: Stable cardiomediastinal silhouette. Tracheostomy tube in grossly good position. No pneumothorax or pleural effusion is noted. Minimal bibasilar subsegmental atelectasis is noted. Both lungs are clear. The visualized skeletal structures are unremarkable. IMPRESSION: Minimal bibasilar subsegmental atelectasis. Electronically Signed   By: Marijo Conception, M.D.   On: 07/23/2015 07:45     Labs:  Basic Metabolic Panel:  Recent Labs Lab 08/16/15 0601  NA 138  K 3.4*  CL 104  CO2 27  GLUCOSE 96  BUN 14  CREATININE 1.23*  CALCIUM 9.5    CBC:  Recent Labs Lab 08/16/15 0601  WBC 5.2  HGB 9.4*  HCT 30.3*  MCV 76.3*  PLT 255    Hepatic Function Latest Ref Rng 08/07/2015 07/25/2015 07/22/2015  Total Protein 6.5 - 8.1 g/dL 6.6 6.5 6.3(L)  Albumin 3.5 - 5.0 g/dL 3.3(L) 2.4(L) 2.3(L)  AST 15 - 41 U/L 22 80(H) 174(H)  ALT 14 - 54 U/L 38 162(H) 186(H)  Alk Phosphatase 38 - 126 U/L 134(H) 383(H) 260(H)  Total Bilirubin 0.3 - 1.2 mg/dL 0.9 2.5(H) 4.1(H)  Bilirubin, Direct 0.1 - 0.5 mg/dL - - 3.1(H)     CBG: No results for input(s): GLUCAP in the last 168 hours.  Brief HPI:   Gwendolyn Morales is a 50 y.o. female with history of morbid obesity, HTN and medical non-compliance who was admitted on 07/12/15 a fall at home with subsequent right sided  weakness and speech difficulty. She was started on cardene drip for malignant BP and CT head done revealed cute hemorrhage 1.7 cm X 1.3 cm at left parietal lobe, small focus of acute hemorrhage left pons and scattered small vessel microangiopathy. She was noted to have intermittent hypoxia with sonorous respirations and was intubated for airway protection. CTA head/neck showed stable hemorrhage, no stenosis, AVM or aneurysm, and advanced dolichoectasia. 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 Dr. 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   She had difficulty with oral secretions and inability to wean off vent requiring tracheostomy prior to tolerating extubation. Blood pressure were difficulty to control and renal ultrasound done revealing minimal flow in right RA with smaller right kidney. 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 She was tolerating PMSV trials and was patient downsized to CFS# 6.  Therapy was ongoing to address dysphagia, dysphonia, aphasia and was working at sitting activity at EOB.    Hospital Course: Gwendolyn Morales was admitted to rehab 07/24/2015 for inpatient therapies to consist of PT, ST and OT at least three hours five days a week. Past admission physiatrist, therapy team and rehab RN have worked together to provide customized collaborative inpatient rehab.  She was tolerating dysphagia 2 diet and respiratory status has been stable. She completed bactrim for treatment of PNA on 06/13 and antibiotic associated diarrhea has resolved.  She was decannulated without difficulty and has been advanced to regular textures. Protein supplement was added due to low protein  store and to facilitate healing. Abnormal LFTs due to shocked liver have resolved.  Ego support was offered by team and she continue son buspar to help manage anxiety levels.   Blood pressures were monitored on bid basis and are showing better control. Lisinopril was discontinued and renal function has improved steadily. She was found to have UTI due to Klebsiella and was treated with bactrim. Incontinence due to UTI has resolved with treatment of infection.  ABLA  has been monitored with serial checks of H/H and has been stable. Dr. Pollard/psychologist has been following for adjustment disorder with depressed mood and has educated patient on techniques for stress management. She has made steady progress and requires supervision due to balance deficits. She will continue to receive follow up [place], [place], [place] and [place] by    Rehab course: During patient's stay in rehab weekly team conferences were held to monitor patient's progress, set goals and discuss barriers to discharge. At admission, patient required min to moderate cues for speech intelligibility and min assist for cognitive tasks. She required total assist with mobility and basic self-care tasks. She has had improvement in activity tolerance, balance, postural control, as well as ability to compensate for deficits. She is has had improvement in functional use RUE  and RLE as well as improved awareness and coordination. She is able to complete bathing and dressing with supervision. She is able to perform transfers and ambulate 180' with supervision and RW. She is tolerating regular textures and needs chin tuck with thin liquids. Speech is >90% intelligible with imprecise articulation of consonants. She is able to complete semi-complex tasks with supervision due to impairment in recall of new information and functional problem solving. Family education was done with husband regarding all aspects of mobility and cognition.    Disposition: 01-Home  or Self Care  Diet: Heart healthy. Chin tuck with liquids.   Special Instructions: 1. Husband given information to follow up with [facility] next week to set up primary MD.  2. Needs supervision with all activity.  3. Needs recheck of LFTs in 2-3 weeks prior to starting statin for dyslipidemia.      Medication List    STOP taking these medications        diphenhydrAMINE 25 MG tablet  Commonly known as:  BENADRYL     ibuprofen 400 MG tablet  Commonly known as:  ADVIL,MOTRIN      TAKE these medications        amLODipine 10 MG tablet  Commonly known as:  NORVASC  Take 1 tablet (10 mg total) by mouth daily.     busPIRone 7.5 MG tablet  Commonly known as:  BUSPAR  Take 1 tablet (7.5 mg total) by mouth 2 (two) times daily.     ferrous sulfate 325 (65 FE) MG tablet  Take 1 tablet (325 mg total) by mouth daily with breakfast.     hydrALAZINE 50 MG tablet  Commonly known as:  APRESOLINE  Take 1 tablet (50 mg total) by mouth every 8 (eight) hours.     hydrochlorothiazide 12.5 MG capsule  Commonly known as:  MICROZIDE  Take 1 capsule (12.5 mg total) by mouth daily.     metoprolol  100 MG tablet  Commonly known as:  LOPRESSOR  Take 1 tablet (100 mg total) by mouth 2 (two) times daily.     multivitamin with minerals Tabs tablet  Take 1 tablet by mouth daily.     potassium chloride SA 20 MEQ tablet  Commonly known as:  K-DUR,KLOR-CON  Take 1 tablet (20 mEq total) by mouth daily.     senna-docusate 8.6-50 MG tablet  Commonly known as:  Senokot-S  Take 2 tablets by mouth 2 (two) times daily.       Follow-up Information    [facility]   Specialty:  Physical Medicine and Rehabilitation   Why:  office will call you with follow up appointment   Contact information:   [address], [address] [place] [phone]      Follow up with Antony Contras, MD. Call today.   Specialties:  Neurology, Radiology   Why:   for follow up appointment   Contact information:   [address] [place] [address] [phone]       Signed: Bary Leriche 08/20/2015, 11:09 AM

## 2015-08-17 NOTE — Progress Notes (Signed)
Social Work  Discharge Note  The overall goal for the admission was met for:   Discharge location: [place]  Length of Stay: Yes-24 DAYS  Discharge activity level: Yes-SUPERVISION LEVEL  Home/community participation: Yes  Services provided included: MD, RD, PT, OT, SLP, RN, CM, TR, Pharmacy, Neuropsych and SW  Financial Services: Other: PENDING MEDICAID  Follow-up services arranged: Home Health: [facility]-PT,OT,RN,SP and Patient/Family has no preference for HH/DME agencies ADDED SW TO ASSIST WITH DISABILITY AND MEDICAID APPROVAL DME-[facility]-TUB BENCH & WHEELCHAIR  Comments (or additional information):HUSBAND WAS HERE TO LEARN PT;S CARE AND FEELS COMFORTABLE WITH HER CARE NEEDS AT HOME. DISABILITY AND MEDICAID APPLICATIONS STILL PENDING HUSBAND TO FOLLOW UP WITH. MATCH PROGRAM GIVEN TO HUSBAND FOR MEDICATION ASSISTANCE. HUSBAND TO PURSUE [place] PCP FOR PT HE HAS PAPERWORK TO DO THIS.   Patient/Family verbalized understanding of follow-up arrangements: Yes  Individual responsible for coordination of the follow-up plan: ROBERT-HUSBAND  Confirmed correct DME delivered: [name] 08/17/2015    [name]

## 2015-08-21 ENCOUNTER — Telehealth: Payer: Self-pay | Admitting: Physical Medicine & Rehabilitation

## 2015-08-21 ENCOUNTER — Telehealth: Payer: Self-pay | Admitting: *Deleted

## 2015-08-21 NOTE — Telephone Encounter (Signed)
[facility] to see patient.

## 2015-08-21 NOTE — Telephone Encounter (Signed)
Contact was made with husband Molly MaduroRobert.   1. Are you/is patient experiencing any problems since coming home? Are there any questions regarding any aspect of care? No 2. Are there any questions regarding medications administration/dosing? Are meds being taken as prescribed? No questions. Taking medications as prescribed.Patient should review meds with caller to confirm.  HHRN vist made this morning and medications reviewed. He is going to have problems with paying for all her medications but MATCH PROGRAM is assisting.  He would like to review medications at visit for $4 list with [facility], if any changes can be made.  He will bring list to appt. 3. Have there been any falls? No 4. Has Home Health been to the house and/or have they contacted you? If not, have you tried to contact them? Can we help you contact them? HHRN out this morning.  PT scheduled for this afternoon.  HHRN has requested ST visit. 5. Are bowels and bladder emptying properly? Are there any unexpected incontinence issues? If applicable, is patient following bowel/bladder programs? Bowels and bladder functioning.  Stools are almost loose. Holding senna. 6. Any fevers, problems with breathing, unexpected pain? No 7. Are there any skin problems or new areas of breakdown?No 8. Has the patient/family member arranged specialty MD follow up (ie cardiology/neurology/renal/surgical/etc)?  Can we help arrange? Has not made contact with [facility] for PCP but has plans to next week.  Appointment given for Dr Bryson DamesKirstiens and has appointment with Dr [name] in August. 9. Does the patient need any other services or support that we can help arrange? No. If cont financial issues HH can request MSW visit. 10. Are caregivers following through as expected in assisting the patient? Yes       11.  Has the patient quit smoking, drinking alcohol, or using drugs as recommended? Yes  Appointment Tuesday 08/28/15 @ 1:00, arrive by 12:45 for Dr Wynn BankerKirsteins Address  reviewed.

## 2015-08-21 NOTE — Telephone Encounter (Signed)
Jeanice LimHolly RN with [facility] needs to get a verbal order for ST for patient.  Please call her at [phone].

## 2015-08-22 ENCOUNTER — Telehealth: Payer: Self-pay | Admitting: Physical Medicine & Rehabilitation

## 2015-08-22 NOTE — Telephone Encounter (Signed)
Hessie Dienerlan notified that his request is approved.

## 2015-08-22 NOTE — Telephone Encounter (Signed)
Hessie DienerAlan PT with [facility] needs to get verbal orders to see patient 2w4.  Please call him at [phone].

## 2015-08-23 ENCOUNTER — Telehealth: Payer: Self-pay | Admitting: *Deleted

## 2015-08-23 NOTE — Telephone Encounter (Signed)
OT called asking for verbal orders for OT homehealth 2week3 to address right upper extremity issues following CVA

## 2015-08-23 NOTE — Telephone Encounter (Signed)
Called OT back and gave verbal orders per office protocol

## 2015-08-24 ENCOUNTER — Telehealth: Payer: Self-pay | Admitting: Neurology

## 2015-08-24 NOTE — Telephone Encounter (Signed)
Kim with [facility] is calling regarding the patient. She needs an order for the patient to have a mobile chest x-ray because she has rhonchi in her lungs. Selena BattenKim can be reached at  [phone].

## 2015-08-24 NOTE — Telephone Encounter (Signed)
[facility]. Pt has rhonchi in her lungs. Rn stated Dr.Sethi does not treat respiratory issues. Selena BattenKim stated pt does not have PCP,but they are working on getting establish with one. Pt has new pt appt with Dr.Sethi at the end of August 2017. Rn stated a message will be sent to Dr.Sethi if he can do or his advice.Kim verbalized understanding.

## 2015-08-27 NOTE — Telephone Encounter (Signed)
I called and left message for Selena BattenKim to call me back

## 2015-08-27 NOTE — Telephone Encounter (Signed)
Ok thanks 

## 2015-08-27 NOTE — Telephone Encounter (Signed)
[facility] Selena BattenKim stated the patient went to the urgent care to get treated for rhonchi.. Pt does not have PCP. Pt still has not establish with PCP. Rn stated its very important patient establish care for PCP. Selena BattenKim stated pt does have insurance and has never went to MD for regular medical care. Selena BattenKim stated she will calling pt to get information for the visit at urgent care. Selena BattenKim stated she was going to speak with son/patient about finding a PCP.Kim stated that for the future to get mobile chest xray, the MD would have to do [facility].

## 2015-08-28 ENCOUNTER — Ambulatory Visit (HOSPITAL_BASED_OUTPATIENT_CLINIC_OR_DEPARTMENT_OTHER): Payer: Self-pay | Admitting: Physical Medicine & Rehabilitation

## 2015-08-28 ENCOUNTER — Encounter: Payer: Self-pay | Admitting: Physical Medicine & Rehabilitation

## 2015-08-28 ENCOUNTER — Encounter: Payer: Self-pay | Attending: Physical Medicine & Rehabilitation

## 2015-08-28 VITALS — BP 137/88 | HR 83 | Resp 17

## 2015-08-28 DIAGNOSIS — I69251 Hemiplegia and hemiparesis following other nontraumatic intracranial hemorrhage affecting right dominant side: Secondary | ICD-10-CM | POA: Insufficient documentation

## 2015-08-28 DIAGNOSIS — I69359 Hemiplegia and hemiparesis following cerebral infarction affecting unspecified side: Secondary | ICD-10-CM

## 2015-08-28 DIAGNOSIS — Z9114 Patient's other noncompliance with medication regimen: Secondary | ICD-10-CM | POA: Insufficient documentation

## 2015-08-28 DIAGNOSIS — F419 Anxiety disorder, unspecified: Secondary | ICD-10-CM | POA: Insufficient documentation

## 2015-08-28 DIAGNOSIS — R269 Unspecified abnormalities of gait and mobility: Secondary | ICD-10-CM | POA: Insufficient documentation

## 2015-08-28 DIAGNOSIS — R05 Cough: Secondary | ICD-10-CM | POA: Insufficient documentation

## 2015-08-28 DIAGNOSIS — I611 Nontraumatic intracerebral hemorrhage in hemisphere, cortical: Secondary | ICD-10-CM

## 2015-08-28 DIAGNOSIS — I69398 Other sequelae of cerebral infarction: Secondary | ICD-10-CM

## 2015-08-28 DIAGNOSIS — I10 Essential (primary) hypertension: Secondary | ICD-10-CM | POA: Insufficient documentation

## 2015-08-28 DIAGNOSIS — I613 Nontraumatic intracerebral hemorrhage in brain stem: Secondary | ICD-10-CM

## 2015-08-28 MED ORDER — HYDROCHLOROTHIAZIDE 12.5 MG PO CAPS: 12.5000 mg | ORAL_CAPSULE | Freq: Every day | ORAL | Status: DC

## 2015-08-28 MED ORDER — HYDRALAZINE HCL 50 MG PO TABS: 50.0000 mg | ORAL_TABLET | Freq: Three times a day (TID) | ORAL | Status: DC

## 2015-08-28 MED ORDER — POTASSIUM CHLORIDE CRYS ER 20 MEQ PO TBCR: 20.0000 meq | EXTENDED_RELEASE_TABLET | Freq: Every day | ORAL | Status: DC

## 2015-08-28 MED ORDER — BUSPIRONE HCL 5 MG PO TABS: 7.5000 mg | ORAL_TABLET | Freq: Two times a day (BID) | ORAL | Status: DC

## 2015-08-28 MED ORDER — AMLODIPINE BESYLATE 10 MG PO TABS: 10.0000 mg | ORAL_TABLET | Freq: Every day | ORAL | Status: DC

## 2015-08-28 NOTE — Progress Notes (Signed)
Subjective:    Patient ID: Gwendolyn Morales, female    DOB: 12/15/1965, 50 y.o.   MRN: [id number] 50 y.o. female with history of morbid obesity, HTN and medical non-compliance who was admitted on 07/12/15 a fall at home with subsequent right sided weakness and speech difficulty. Gwendolyn Morales was started on cardene drip for malignant BP and CT head done revealed cute hemorrhage 1.7 cm X 1.3 cm at left parietal lobe, small focus of acute hemorrhage left pons and scattered small vessel microangiopathy.  Gwendolyn Morales was noted to have intermittent hypoxia with sonorous respirations and was intubated for airway protection. CTA head/neck showed stable hemorrhage, no stenosis, AVM or aneurysm, and advanced dolichoectasia. MRI brain revealed posterior left frontal opercular hematoma with mild vasogenic edema, left pontine hematoma,  acute/ subacute infarcts in bilateral frontal and parietal lobes and scattered chronic micro- hemorrhages.  Dr. 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   Gwendolyn Morales had difficulty with oral secretions and inability to wean off vent requiring tracheostomy prior to tolerating extubation. Blood pressure were difficulty to control and renal ultrasound done revealing minimal flow in right RA with smaller right kidney. 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 Gwendolyn Morales was tolerating PMSV trials and was patient downsized to CFS# 6  HPI Patient is currently getting home health PT, OT, speech therapy and nursing. Gwendolyn Morales is independent with her dressing, and bathing with the exception of needing some supervision during bathing. Patient requires supervision for ambulation with a rolling walker. No falls at home, does not have PCP, running out of blood pressure medications. Husband wants to go over her medication list to see if all of these medicines are on the $4 list with [facility]  pharmacy. Husband has had to quit work to help care for wife Pain Inventory Average Pain 0 Pain Right Now 0 My pain is aching  In the last 24 hours, has pain interfered with the following? General activity 0 Relation with others 0 Enjoyment of life 0 What TIME of day is your pain at its worst? NA Sleep (in general) Poor  Pain is worse with: other Pain improves with: other Relief from Meds: 0  Mobility use a walker ability to climb steps?  yes do you drive?  no use a wheelchair  Function not employed: date last employed 12/2014 I need assistance with the following:  dressing, bathing, toileting, meal prep, household duties and shopping  Neuro/Psych weakness trouble walking  Prior Studies NA  Physicians involved in your care Working on getting a PCP   Family History  Problem Relation Age of Onset  . Diabetes Mother    Social History   Social History  . Marital Status: Married    Spouse Name: N/A  . Number of Children: N/A  . Years of Education: N/A   Social History Main Topics  . Smoking status: Never Smoker   . Smokeless tobacco: None  . Alcohol Use: No  . Drug Use: No  . Sexual Activity: Not Asked   Other Topics Concern  . None   Social History Narrative   History reviewed. No pertinent past surgical history. Past Medical History  Diagnosis Date  . Hypertension   . Stroke (HCC)    BP 137/88 mmHg  Pulse 83  Resp 17  SpO2 96%  Opioid Risk Score:   Fall Risk Score:  `1  Depression screen  PHQ 2/9  No flowsheet data found.   Review of Systems  Constitutional: Positive for fever and chills.  Respiratory: Positive for cough.   Musculoskeletal: Positive for gait problem.  Neurological: Positive for weakness.  Psychiatric/Behavioral: Positive for dysphoric mood.  All other systems reviewed and are negative.      Objective:   Physical Exam  Constitutional: Gwendolyn Morales is oriented to person, place, and time. Gwendolyn Morales appears well-developed and  well-nourished.  HENT:  Head: Normocephalic and atraumatic.  Eyes: Conjunctivae and EOM are normal. Pupils are equal, round, and reactive to light.  Neck: Normal range of motion.  Cardiovascular: Normal heart sounds and intact distal pulses.   No murmur heard. Pulmonary/Chest: Effort normal and breath sounds normal. No respiratory distress. Gwendolyn Morales has no wheezes. Gwendolyn Morales has no rales.  Abdominal: Soft. Bowel sounds are normal. Gwendolyn Morales exhibits no distension. There is no tenderness.  Musculoskeletal: Normal range of motion. Gwendolyn Morales exhibits no edema or tenderness.  Neurological: Gwendolyn Morales is alert and oriented to person, place, and time. No sensory deficit.  Motor strength is 5/5 bilateral deltoids, biceps, triceps, grip, 3 minus bilateral hip flexors 4 bilateral, knee extensors 4 bilateral, ankle dorsiflexor and plantar flexors  Sensation is intact to light touch and pinprick bilateral upper and lower limbs  Psychiatric: Gwendolyn Morales has a normal mood and affect.  Nursing note and vitals reviewed.         Assessment & Plan:  Medical Problem List and Plan: 1.  Functional deficits, right hemiparesis and general debility secondary to hypertensive left parietal and pontine hemorrhages with subsequent complications As discussed with the patient and her husband, Gwendolyn Morales also had smaller infarcts in the right frontal and parietal areas. This could cause some weakness in the left lower as well as some left facial weakness that Gwendolyn Morales had noted. Gwendolyn Morales has been making excellent improvement. Recommend continued home health PT, OT, speech, RN,  PMR will reassess in around one month. 2.  cough, lung exam is normal. I actually did not witness any coughs during our office visit. Gwendolyn Morales continues Mucinex when necessary.   3. Skin/Wound Care:  trach site healed   4. Fluids/Electrolytes/Nutrition: Patient reports good intake   5. HTN: Continue norvasc, lisinopril, apresoline, metoprolol and HCTZ.  [facility]  [facility], I'll refill her medications until that time   6. Abnormal LFT:  will get follow-up. LFTs at primary care physician and if they are normal, would start statin 7. Anxiety: Improving, reduce buspar to 5 mg  bid.

## 2015-08-28 NOTE — Patient Instructions (Signed)
This will be the last set of prescriptions from this office. You'll need to get in with his primary physician for the next set.

## 2015-09-07 ENCOUNTER — Other Ambulatory Visit: Payer: Self-pay | Admitting: Physical Medicine and Rehabilitation

## 2015-09-12 ENCOUNTER — Telehealth: Payer: Self-pay | Admitting: Neurology

## 2015-09-12 NOTE — Telephone Encounter (Signed)
Gwendolyn November [facility] [phone] called said pt is having issues with her BP. It is reading 162/104. He said he will have RN take a look at her. Pt does not have PCP. I talked with Katrina and she advised pt to go to Urgent Care. He said if they can't get it down they will take her.

## 2015-09-13 ENCOUNTER — Telehealth: Payer: Self-pay | Admitting: Physical Medicine & Rehabilitation

## 2015-09-13 NOTE — Telephone Encounter (Signed)
Kathlene November Mowers OT with [facility] needs a call back about patient.  Her blood pressure was 162/104 and it looks like she is having CVA on left side of body but originally it was on right side.  She can't get in until end of month with new PCP and neurology.  He needs to get some direction on what to do for patient.  Please call him at [phone].

## 2015-09-13 NOTE — Telephone Encounter (Signed)
Ok agree with plan

## 2015-09-13 NOTE — Telephone Encounter (Signed)
I spoke with Kathlene November OT and he definitely feels like there is a change--her strength was 4/5 and now can hardly make a fist.  They had an RN go out and see her and she had her take her second dose of BP med early.  She has no PCP and is having no luck with [facility]. He also notified Dr [name] office. She has been told to go to ED with worsening symptoms.  She has appts with both Kirsteins and Sethi 8/23 and 8/31.  Also, OT is out of orders tomorrow and feels she definitely needs more work but with her being uninsured not sure about visit status.  I spoke with Dr Allena Katz and he advised that she go to the ED to be evaluated if she is having left sided symptoms when originally it was her right side. I have left a messate with Kathlene November OT. I called and spoke her husband and she is doing ok righ now, seems a little better.  Her last bp was 136/81.  I encouraged him to call 911 if she has any problems breathing swallowing or appears to worsen.  We are checking to see if there is an earlier appt with Dr Wynn Banker who is out of the office until Monday 09/17/15.

## 2015-09-18 ENCOUNTER — Telehealth: Payer: Self-pay | Admitting: Physical Medicine & Rehabilitation

## 2015-09-18 DIAGNOSIS — R1310 Dysphagia, unspecified: Secondary | ICD-10-CM

## 2015-09-18 DIAGNOSIS — I69398 Other sequelae of cerebral infarction: Secondary | ICD-10-CM

## 2015-09-18 DIAGNOSIS — I10 Essential (primary) hypertension: Secondary | ICD-10-CM

## 2015-09-18 DIAGNOSIS — M6281 Muscle weakness (generalized): Secondary | ICD-10-CM

## 2015-09-18 NOTE — Telephone Encounter (Signed)
Left message for Gwendolyn Morales with approval.

## 2015-09-18 NOTE — Telephone Encounter (Signed)
Hessie DienerAlan PT with [facility] needs to get verbal orders for 2w2 in addition to 2 visit that he missed last week with patient.  Please call him at [phone].

## 2015-09-21 ENCOUNTER — Ambulatory Visit (HOSPITAL_BASED_OUTPATIENT_CLINIC_OR_DEPARTMENT_OTHER): Payer: Self-pay | Admitting: Physical Medicine & Rehabilitation

## 2015-09-21 ENCOUNTER — Encounter: Payer: Self-pay | Attending: Physical Medicine & Rehabilitation

## 2015-09-21 ENCOUNTER — Encounter: Payer: Self-pay | Admitting: Physical Medicine & Rehabilitation

## 2015-09-21 VITALS — BP 135/79 | HR 84

## 2015-09-21 DIAGNOSIS — R05 Cough: Secondary | ICD-10-CM | POA: Insufficient documentation

## 2015-09-21 DIAGNOSIS — I611 Nontraumatic intracerebral hemorrhage in hemisphere, cortical: Secondary | ICD-10-CM | POA: Insufficient documentation

## 2015-09-21 DIAGNOSIS — R269 Unspecified abnormalities of gait and mobility: Secondary | ICD-10-CM

## 2015-09-21 DIAGNOSIS — Z9114 Patient's other noncompliance with medication regimen: Secondary | ICD-10-CM | POA: Insufficient documentation

## 2015-09-21 DIAGNOSIS — I69359 Hemiplegia and hemiparesis following cerebral infarction affecting unspecified side: Secondary | ICD-10-CM

## 2015-09-21 DIAGNOSIS — I69391 Dysphagia following cerebral infarction: Secondary | ICD-10-CM

## 2015-09-21 DIAGNOSIS — I69398 Other sequelae of cerebral infarction: Secondary | ICD-10-CM

## 2015-09-21 DIAGNOSIS — I69251 Hemiplegia and hemiparesis following other nontraumatic intracranial hemorrhage affecting right dominant side: Secondary | ICD-10-CM | POA: Insufficient documentation

## 2015-09-21 DIAGNOSIS — I10 Essential (primary) hypertension: Secondary | ICD-10-CM | POA: Insufficient documentation

## 2015-09-21 DIAGNOSIS — F419 Anxiety disorder, unspecified: Secondary | ICD-10-CM | POA: Insufficient documentation

## 2015-09-21 NOTE — Patient Instructions (Signed)
Please go directly to emergency Department if you have another episode of weakness

## 2015-09-21 NOTE — Progress Notes (Signed)
Subjective:    Patient ID: Gwendolyn Morales, female    DOB: Nov 20, 1965, 50 y.o.   MRN: [id number] History of left parietal hemorrhage and left pontine hemorrhage as well as bilateral frontal and parietal infarcts HPI Left arm weakness onset July 31th, worsened through August 2nd.  Now doing better , called office but didn't go to ED. BP was elevated ~165/96 Still mod I ADLs Amb with walker Tied her own shoes today Pain Inventory Average Pain 0 Pain Right Now 0 My pain is no pain  In the last 24 hours, has pain interfered with the following? General activity 0 Relation with others 0 Enjoyment of life 0 What TIME of day is your pain at its worst? no pain Sleep (in general) Fair  Pain is worse with: no pain Pain improves with: no pain Relief from Meds: no pain  Mobility walk with assistance use a walker how many minutes can you walk? varies ability to climb steps?  no do you drive?  no use a wheelchair needs help with transfers Do you have any goals in this area?  yes  Function not employed: date last employed . I need assistance with the following:  bathing, meal prep, household duties and shopping Do you have any goals in this area?  yes  Neuro/Psych weakness numbness trouble walking confusion depression loss of taste or smell  Prior Studies Any changes since last visit?  no  Physicians involved in your care Any changes since last visit?  no   Family History  Problem Relation Age of Onset  . Diabetes Mother    Social History   Social History  . Marital status: Married    Spouse name: N/A  . Number of children: N/A  . Years of education: N/A   Social History Main Topics  . Smoking status: Never Smoker  . Smokeless tobacco: Never Used  . Alcohol use No  . Drug use: No  . Sexual activity: Not Asked   Other Topics Concern  . None   Social History Narrative  . None   History reviewed. No pertinent surgical history. Past Medical History:    Diagnosis Date  . Hypertension   . Stroke (HCC)    BP 135/79 (BP Location: Right Arm, Patient Position: Sitting, Cuff Size: Large)   Pulse 84   SpO2 95%   Opioid Risk Score:   Fall Risk Score:  `1  Depression screen PHQ 2/9  No flowsheet data found.  Review of Systems  HENT: Positive for trouble swallowing and voice change.   Eyes: Negative.   Respiratory: Negative.   Gastrointestinal: Negative.   Endocrine: Negative.   Genitourinary: Negative.   Musculoskeletal: Positive for gait problem.  Allergic/Immunologic: Negative.   Neurological: Positive for weakness and numbness.  Psychiatric/Behavioral: Positive for confusion and dysphoric mood. The patient is nervous/anxious.        Objective:   Physical Exam  Constitutional: She is oriented to person, place, and time. She appears well-developed and well-nourished.  HENT:  Head: Normocephalic and atraumatic.  Eyes: Conjunctivae and EOM are normal. Pupils are equal, round, and reactive to light.  Neck: Normal range of motion.  Neurological: She is alert and oriented to person, place, and time.  Psychiatric: She has a normal mood and affect.  Nursing note and vitals reviewed.   Motor strength is 5/5 bilateral deltoid, biceps, triceps, grip Sensation intact light touch bilateral upper limbs. Patient has truncal ataxia with widened base of support. She is able to ambulate  with rolling walker, modified independent level. Speech with minimal dysarthria. Mood and affect are appropriate       Assessment & Plan:  1. History of bilateral infarcts as well as left pontine hemorrhage and left parietal  Hemorrhage.  Has completed inpatient rehabilitation and is currently receiving home health PT, OT. She had an episode of increased weakness of the left arm, which she states lasted for a couple days but did not seek medical attention through the emergency department. At this point, I really don't see any neurologic changes and she  is functioning at a modified independent level still. 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 We discussed that if she has any increased weakness, that she should go straight to the emergency department for evaluation. For now, she'll continue home health PT, OT. Return to clinic in 4-6 weeks. We may transition to outpatient therapy. If there is funding for this

## 2015-09-26 ENCOUNTER — Telehealth: Payer: Self-pay

## 2015-09-26 NOTE — Telephone Encounter (Signed)
[facility] on [facility] for 1wk1 and 2wk3. The voicemail was not personalized, so I left a message to call back to get verbal approval.

## 2015-09-27 NOTE — Telephone Encounter (Signed)
Raynelle FanningJulie notified of approval.

## 2015-09-28 ENCOUNTER — Telehealth: Payer: Self-pay | Admitting: Physical Medicine & Rehabilitation

## 2015-09-28 NOTE — Telephone Encounter (Signed)
Hessie DienerAlan PT with [facility] needs to get a verbal order for ST to reassess patient.  Please call him at [phone].

## 2015-09-28 NOTE — Telephone Encounter (Signed)
Approval given

## 2015-10-03 ENCOUNTER — Telehealth: Payer: Self-pay | Admitting: Physical Medicine & Rehabilitation

## 2015-10-03 NOTE — Telephone Encounter (Signed)
Hessie DienerAlan PT with [facility] needed to let us know that patient declined visit yesterday, she was not feeling well.  Any questions please call him at [phone].

## 2015-10-08 ENCOUNTER — Encounter: Payer: Self-pay | Admitting: Neurology

## 2015-10-08 ENCOUNTER — Ambulatory Visit (INDEPENDENT_AMBULATORY_CARE_PROVIDER_SITE_OTHER): Payer: Self-pay | Admitting: Neurology

## 2015-10-08 VITALS — BP 137/90 | HR 74 | Ht 68.0 in | Wt 243.0 lb

## 2015-10-08 DIAGNOSIS — I610 Nontraumatic intracerebral hemorrhage in hemisphere, subcortical: Secondary | ICD-10-CM

## 2015-10-08 NOTE — Patient Instructions (Signed)
I had a long d/w patient and her husband about her recent stroke and TIA, risk for recurrent stroke/TIAs, personally independently reviewed imaging studies and stroke evaluation results and answered questions.Start aspirin 81 mg daily  for secondary stroke prevention  Given recent TIA despite h/o brain hemorrhageand maintain strict control of hypertension with blood pressure goal below 130/90, diabetes with hemoglobin A1c goal below 6.5% and lipids with LDL cholesterol goal below 70 mg/dL. I also advised the patient to eat a healthy diet with plenty of whole grains, cereals, fruits and vegetables, exercise regularly and maintain ideal body weight .the patient unfortunately does not have any health insurance and will not be able to afford outpatient physical and occupational therapy as well as brain imaging studies. She has applied for Medicaid and have advised him to call me once it is approved Followup in the future with stroke nurse practitioner in 3 months or call earlier if necessary-

## 2015-10-08 NOTE — Progress Notes (Signed)
[facility] [address] [place].  [address] [phone]       OFFICE FOLLOW-UP NOTE  Gwendolyn Morales Date of Birth:  Oct 17, 1965 Medical Record Number:  [id number]   HPI: Gwendolyn Morales is a 5 year Caucasian lady seen today for the first office follow-up visit for in-hospital admission for stroke in June 2017. She is accompanied by husband. Gwendolyn Morales is an 50 y.o. female with a history of hypertension and noncompliance with treatment brought to the ED and code stroke status following acute onset of a physical fall at home and afterwards having difficulty with speech output as well as right-sided weakness. She has no previous history of stroke nor TIA. She has not been on antiplatelet therapy. Blood pressure was markedly elevated at 280/150, per EMS. Blood pressure remained elevated at 235/122 after 10 mg of labetalol 2. Cardene drip was started. CT scan of her head showed a 1.7 x 1.3 cm acute left parietal hemorrhage, as well as a 0.6 cm hemorrhage involving the left lateral pons. NIH stroke score was 10. ICH score 3. She was LKW at 8:15 PM on 07/12/2015. Patient was not administered IV t-PA secondary to ICH. She was admitted to the neuro ICU for further evaluation and treatment. She was intubated and admitted to the ICU. Elevated BP was treated with cardene, then changed to cleviprex. Remains elevated after addition of oral agents. Renal consult felt it was due to hemorrhage and long-standing HTN. 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 Treated with Vanc and Zosyn for PNA. Trach placed and weaned off vent. Discharged to CIR for ongoing therapy. MRI scan of the brain showed multifocal micro-bleeds microinfarcts raising question of hypertensive small vessel disease versus vasculitis. LP was considered but not done since patient was not stable. Transthoracic echo showed normal ejection fraction. Renal artery ultrasound showed small right kidney with lack  of flow in the right renal artery. 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 Patient did well in inpatient rehabilitation and has been discharged home. She has recently finished home physical and occupational therapy. She is able to walk with a walker now. She is careful and has had only one minor fall. 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 She had an episode 3 weeks ago when she had weakness on the left arm and leg. She did not seek medical help as she does not have health insurance. The symptoms resolved within a few days. Patient has applied for Medicaid but is not yet approved. She states her blood pressure much better control now and today it is 137/90. She has seen Dr. Illa Level in the rehabilitation clinic and also has started seeing apractitioner in [place] for her primary care needs. A nurse practitioner in [place] for her primary care neeeds. ROS:   14 system review of systems is positive for  cough, black stools, allergies, gait difficulty, left-sided weakness and all other systems negative PMH:  Past Medical History:  Diagnosis Date  . Hypertension   . Stroke [facility])     Social History:  Social History   Social History  . Marital status: Married    Spouse name: N/A  . Number of children: N/A  . Years of education: N/A   Occupational History  . Not on file.   Social History  Main Topics  . Smoking status: Never Smoker  . Smokeless tobacco: Never Used  . Alcohol use No  . Drug use: No  . Sexual activity: Not on file   Other Topics Concern  . Not on file   Social History Narrative  . No narrative on file    Medications:   Current Outpatient Prescriptions on File Prior to Visit  Medication Sig Dispense Refill  . amLODipine (NORVASC) 10 MG tablet Take 1 tablet (10 mg total) by  mouth daily. 30 tablet 0  . busPIRone (BUSPAR) 5 MG tablet Take 1.5 tablets (7.5 mg total) by mouth 2 (two) times daily. 60 tablet 0  . ferrous sulfate 325 (65 FE) MG tablet Take 1 tablet (325 mg total) by mouth daily with breakfast. 30 tablet 0  . guaiFENesin (MUCINEX) 600 MG 12 hr tablet Take by mouth 2 (two) times daily as needed.     . hydrALAZINE (APRESOLINE) 50 MG tablet Take 1 tablet (50 mg total) by mouth every 8 (eight) hours. 90 tablet 0  . hydrochlorothiazide (MICROZIDE) 12.5 MG capsule Take 1 capsule (12.5 mg total) by mouth daily. 30 capsule 0  . metoprolol (LOPRESSOR) 100 MG tablet TAKE ONE TABLET BY MOUTH TWICE DAILY 60 tablet 0  . Multiple Vitamin (MULTIVITAMIN WITH MINERALS) TABS tablet Take 1 tablet by mouth daily.    . potassium chloride SA (K-DUR,KLOR-CON) 20 MEQ tablet Take 1 tablet (20 mEq total) by mouth daily. 30 tablet 0  . senna-docusate (SENOKOT-S) 8.6-50 MG tablet Take 2 tablets by mouth 2 (two) times daily. (Patient taking differently: Take 2 tablets by mouth as needed. ) 100 tablet 0   No current facility-administered medications on file prior to visit.     Allergies:  No Known Allergies  Physical Exam General: Obese young Caucasian lady seated, in no evident distress Head: head normocephalic and atraumatic.  Neck: supple with no carotid or supraclavicular bruits. Healed tracheostomy scar in the neck and midline Cardiovascular: regular rate and rhythm, no murmurs Musculoskeletal: no deformity Skin:  no rash/petichiae Vascular:  Normal pulses all extremities Vitals:   10/08/15 0848  BP: 137/90  Pulse: 74   Neurologic Exam Mental Status: Awake and fully alert. Oriented to place and time. Recent and remote memory intact. Attention span, concentration and fund of knowledge appropriate. Mood and affect appropriate. Voice is hoarse following tracheostomy Cranial Nerves: Fundoscopic exam reveals sharp disc margins. Pupils equal, briskly reactive to light.  Extraocular movements full without nystagmus. Visual fields full to confrontation. Hearing intact. Facial sensation intact.Mild left lower facial weakness..Tongue, palate moves normally and symmetrically.  Motor: Normal bulk and tone. Normal strength in all tested extremity muscles. Mild left grip weakness. Diminished fine finger movements on the left. Orbits right over left upper extremity. Mild left hip flexor and ankle dorsiflexor weakness. Sensory.: intact to touch ,pinprick .position and vibratory sensation.  Coordination: Rapid alternating movements normal in all extremities. Finger-to-nose and heel-to-shin performed accurately bilaterally. Gait and Station: Patient sitting in wheelchair. Gait deferred as patient did not bring her walker.  Reflexes: 1+ and symmetric. Toes downgoing.   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 Recent episode of transient left hemiparesis 3 weeks ago likely right hemispheric TIA versus small lacunar infarct    PLAN: I had a long d/w patient and her husband about her recent stroke and TIA, risk for recurrent  stroke/TIAs, personally independently reviewed imaging studies and stroke evaluation results and answered questions.Start aspirin 81 mg daily  for secondary stroke prevention  Given recent TIA despite h/o brain hemorrhageand maintain strict control of hypertension with blood pressure goal below 130/90, diabetes with hemoglobin A1c goal below 6.5% and lipids with LDL cholesterol goal below 70 mg/dL. I also advised the patient to eat a healthy diet with plenty of whole grains, cereals, fruits and vegetables, exercise regularly and maintain ideal body weight .the patient unfortunately does not have any health insurance and will not be able to afford outpatient physical and occupational therapy as well as brain imaging studies. She  has applied for Medicaid and have advised him to call me once it is approved Followup in the future with stroke nurse practitioner in 3 months or call earlier if necessary- Greater than 50% of time during this 25 minute visit was spent on counseling,explanation of diagnosis, planning of further management, discussion with patient and family and coordination of care Delia HeadyPramod Sethi, MD  [facility] [address] [place] [id number]  Phone [phone] Fax [phone] Note: This document was prepared with digital dictation and possible smart phrase technology. Any transcriptional errors that result from this process are unintentional

## 2015-10-10 ENCOUNTER — Telehealth: Payer: Self-pay | Admitting: Physical Medicine & Rehabilitation

## 2015-10-10 NOTE — Telephone Encounter (Signed)
[name] with [facility] need to let us know about a missed visit this week, patient declined visit.  She will be discharging the patient next week.  Any questions please call Boneta LucksJenny at [phone].

## 2015-10-10 NOTE — Telephone Encounter (Signed)
FYI

## 2015-10-11 ENCOUNTER — Ambulatory Visit: Payer: Self-pay | Admitting: Physical Medicine & Rehabilitation

## 2015-10-11 ENCOUNTER — Ambulatory Visit: Payer: Self-pay

## 2015-10-11 ENCOUNTER — Other Ambulatory Visit: Payer: Self-pay | Admitting: Physical Medicine & Rehabilitation

## 2015-10-12 ENCOUNTER — Other Ambulatory Visit: Payer: Self-pay | Admitting: Physical Medicine & Rehabilitation

## 2015-10-16 ENCOUNTER — Other Ambulatory Visit: Payer: Self-pay | Admitting: Physical Medicine & Rehabilitation

## 2015-10-17 ENCOUNTER — Other Ambulatory Visit: Payer: Self-pay | Admitting: Physical Medicine & Rehabilitation

## 2015-10-30 ENCOUNTER — Encounter: Payer: Self-pay | Attending: Physical Medicine & Rehabilitation

## 2015-10-30 ENCOUNTER — Encounter: Payer: Self-pay | Admitting: Physical Medicine & Rehabilitation

## 2015-10-30 ENCOUNTER — Ambulatory Visit (HOSPITAL_BASED_OUTPATIENT_CLINIC_OR_DEPARTMENT_OTHER): Payer: Self-pay | Admitting: Physical Medicine & Rehabilitation

## 2015-10-30 VITALS — BP 147/90 | HR 78 | Resp 16

## 2015-10-30 DIAGNOSIS — Z9114 Patient's other noncompliance with medication regimen: Secondary | ICD-10-CM | POA: Insufficient documentation

## 2015-10-30 DIAGNOSIS — I69398 Other sequelae of cerebral infarction: Secondary | ICD-10-CM

## 2015-10-30 DIAGNOSIS — R269 Unspecified abnormalities of gait and mobility: Secondary | ICD-10-CM

## 2015-10-30 DIAGNOSIS — F419 Anxiety disorder, unspecified: Secondary | ICD-10-CM | POA: Insufficient documentation

## 2015-10-30 DIAGNOSIS — I611 Nontraumatic intracerebral hemorrhage in hemisphere, cortical: Secondary | ICD-10-CM | POA: Insufficient documentation

## 2015-10-30 DIAGNOSIS — I69359 Hemiplegia and hemiparesis following cerebral infarction affecting unspecified side: Secondary | ICD-10-CM

## 2015-10-30 DIAGNOSIS — I10 Essential (primary) hypertension: Secondary | ICD-10-CM | POA: Insufficient documentation

## 2015-10-30 DIAGNOSIS — R05 Cough: Secondary | ICD-10-CM | POA: Insufficient documentation

## 2015-10-30 DIAGNOSIS — I69251 Hemiplegia and hemiparesis following other nontraumatic intracranial hemorrhage affecting right dominant side: Secondary | ICD-10-CM | POA: Insufficient documentation

## 2015-10-30 NOTE — Progress Notes (Signed)
Subjective:    Patient ID: Gwendolyn Morales, female    DOB: [birth year], 50 y.o.   MRN: [id number] 50 y.o. female with history of morbid obesity, HTN and medical non-compliance who was admitted on 07/12/15 a fall at home with subsequent right sided weakness and speech difficulty. She was started on cardene drip for malignant BP and CT head done revealed cute hemorrhage 1.7 cm X 1.3 cm at left parietal lobe, small focus of acute hemorrhage left pons and scattered small vessel microangiopathy MRI demonstrated additional infarcts, bilateral frontal and parietal lobes. 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  Therapy has run out due to charity care out  Pain Inventory Average Pain 0 Pain Right Now 0 My pain is no pain  In the last 24 hours, has pain interfered with the following? General activity 0 Relation with others 0 Enjoyment of life 0 What TIME of day is your pain at its worst? no pain Sleep (in general) Fair  Pain is worse with: no pain Pain improves with: no pain Relief from Meds: no pain  Mobility walk with assistance use a walker ability to climb steps?  yes do you drive?  no  Function disabled: date disabled . I need assistance with the following:  bathing, meal prep, household duties and shopping  Neuro/Psych bladder control problems anxiety  Prior Studies Any changes since last visit?  no  Physicians involved in your care Any changes since last visit?  no   Family History  Problem Relation Age of Onset  . Diabetes Mother    Social History   Social History  . Marital status: Married    Spouse name: N/A  . Number of children: N/A  . Years of education: N/A   Social History Main Topics  . Smoking status: Never Smoker  . Smokeless tobacco: Never Used  . Alcohol use  No  . Drug use: No  . Sexual activity: Not on file   Other Topics Concern  . Not on file   Social History Narrative  . No narrative on file   No past surgical history on file. Past Medical History:  Diagnosis Date  . Hypertension   . Stroke [facility])     Opioid Risk Score:   Fall Risk Score:  `1  Depression screen PHQ 2/9  Depression screen PHQ 2/9 10/30/2015  Decreased Interest 0  Down, Depressed, Hopeless 1  PHQ - 2 Score 1    Review of Systems  All other systems reviewed and are negative.      Objective:   Physical Exam  4+ Left delt bit tri and grip, 5/5 in the right deltoid, biceps, triceps, grip Sensation equal to LT in BUE Motor strength is 4/5 bilateral hip flexors, knee extensors, ankle dorsiflexors. Speech with mild dysarthria. Extremities have 1 plus edema at the foot and ankle area. Reduced rapid alternating movement of both feet as well as left arm     Assessment & Plan:  1. 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 Addition, hemorrhage in the left parietal, left pontine area. 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 Her sitting balance is good. She has some weakness in her left arm She would like to go back to driving, but she is not  ready and will discuss it at next visit in 3 months. She would like to drive riding mower. She is to do this all the time with her husband while he was outside doing weed whacking and leaf blowing. I think she can try this in another month. She would be supervised by her husband. He described the controls on the mower

## 2015-10-30 NOTE — Patient Instructions (Signed)
See me in 3 month  Start riding lawnmower in 1 mo  No driving yet , will discuss in 3 months

## 2015-11-02 ENCOUNTER — Ambulatory Visit: Payer: Self-pay | Admitting: Physical Medicine & Rehabilitation

## 2016-01-09 ENCOUNTER — Ambulatory Visit: Payer: Self-pay | Admitting: Nurse Practitioner

## 2016-01-29 ENCOUNTER — Ambulatory Visit: Payer: Self-pay | Admitting: Physical Medicine & Rehabilitation

## 2016-04-15 ENCOUNTER — Ambulatory Visit (HOSPITAL_BASED_OUTPATIENT_CLINIC_OR_DEPARTMENT_OTHER): Payer: Self-pay | Admitting: Physical Medicine & Rehabilitation

## 2016-04-15 ENCOUNTER — Encounter: Payer: Medicaid Other | Attending: Physical Medicine & Rehabilitation

## 2016-04-15 ENCOUNTER — Encounter: Payer: Self-pay | Admitting: Physical Medicine & Rehabilitation

## 2016-04-15 VITALS — BP 154/93 | HR 86

## 2016-04-15 DIAGNOSIS — I69319 Unspecified symptoms and signs involving cognitive functions following cerebral infarction: Secondary | ICD-10-CM | POA: Insufficient documentation

## 2016-04-15 DIAGNOSIS — I69354 Hemiplegia and hemiparesis following cerebral infarction affecting left non-dominant side: Secondary | ICD-10-CM | POA: Diagnosis present

## 2016-04-15 DIAGNOSIS — Z6836 Body mass index (BMI) 36.0-36.9, adult: Secondary | ICD-10-CM | POA: Diagnosis not present

## 2016-04-15 DIAGNOSIS — Z833 Family history of diabetes mellitus: Secondary | ICD-10-CM | POA: Diagnosis not present

## 2016-04-15 DIAGNOSIS — I69359 Hemiplegia and hemiparesis following cerebral infarction affecting unspecified side: Secondary | ICD-10-CM

## 2016-04-15 DIAGNOSIS — I10 Essential (primary) hypertension: Secondary | ICD-10-CM | POA: Insufficient documentation

## 2016-04-15 NOTE — Patient Instructions (Signed)
No driving untill cleared by neurology

## 2016-04-15 NOTE — Progress Notes (Signed)
Subjective:    Patient ID: Gwendolyn Morales, female    DOB: 12/20/1965, 51 y.o.   MRN: [id number] 51 y.o. female with history of morbid obesity, HTN and medical non-compliance who was admitted on 07/12/15 a fall at home with subsequent right sided weakness and speech difficulty. She was started on cardene drip for malignant BP and CT head done revealed cute hemorrhage 1.7 cm X 1.3 cm at left parietal lobe, small focus of acute hemorrhage left pons and scattered small vessel microangiopathy.  She was noted to have intermittent hypoxia with sonorous respirations and was intubated for airway protection. CTA head/neck showed stable hemorrhage, no stenosis, AVM or aneurysm, and advanced dolichoectasia. MRI brain revealed posterior left frontal opercular hematoma with mild vasogenic edema, left pontine hematoma,  acute/ subacute infarcts in bilateral frontal and parietal lobes and scattered chronic micro- hemorrhages.  Dr. 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   She had difficulty with oral secretions and inability to wean off vent requiring tracheostomy prior to tolerating extubation. Blood pressure were difficulty to control and renal ultrasound done revealing minimal flow in right RA with smaller right kidney. 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 She was tolerating PMSV trials and was patient downsized to CFS# 6   HPI No falls since august  Left arm feels weaker than right side.  Folding laundry, baked a cake.   Denies numbness in hands or feet Needs supervision with bathing Mod I dressing Uses walker to ambulate at home, WC in the community  Completed. Home health therapy, according to her husband. She received about 2 and a half months  Pain Inventory Average Pain 2 Pain Right Now 1 My pain is intermittent  In the last 24 hours, has pain  interfered with the following? General activity 2 Relation with others 2 Enjoyment of life 2 What TIME of day is your pain at its worst? . Sleep (in general) Poor  Pain is worse with: unsure and some activites Pain improves with: rest Relief from Meds: .  Mobility walk with assistance use a walker ability to climb steps?  no do you drive?  no use a wheelchair  Function I need assistance with the following:  bathing, meal prep, household duties and shopping  Neuro/Psych weakness trouble walking  Prior Studies Any changes since last visit?  no  Physicians involved in your care Any changes since last visit?  no   Family History  Problem Relation Age of Onset  . Diabetes Mother    Social History   Social History  . Marital status: Married    Spouse name: N/A  . Number of children: N/A  . Years of education: N/A   Social History Main Topics  . Smoking status: Never Smoker  . Smokeless tobacco: Never Used  . Alcohol use No  . Drug use: No  . Sexual activity: Not on file   Other Topics Concern  . Not on file   Social History Narrative  . No narrative on file   No past surgical history on file. Past Medical History:  Diagnosis Date  . Hypertension   . Stroke [facility])    There were no vitals taken for this visit.  Opioid Risk Score:   Fall Risk Score:  `1  Depression screen PHQ 2/9  Depression screen PHQ 2/9 10/30/2015  Decreased Interest 0  Down,  Depressed, Hopeless 1  PHQ - 2 Score 1    Review of Systems  Constitutional: Positive for fever.  HENT: Negative.   Eyes: Negative.   Respiratory: Negative.   Cardiovascular: Negative.   Gastrointestinal: Negative.   Endocrine: Negative.   Genitourinary: Negative.   Musculoskeletal: Negative.   Skin: Negative.   Allergic/Immunologic: Negative.   Neurological: Negative.   Hematological: Negative.   Psychiatric/Behavioral: Negative.   All other systems reviewed and are negative.      Objective:    Physical Exam  Constitutional: She is oriented to person, place, and time. She appears well-developed and well-nourished.  HENT:  Head: Normocephalic and atraumatic.  Eyes: Conjunctivae and EOM are normal. Pupils are equal, round, and reactive to light.  Neurological: She is alert and oriented to person, place, and time. Gait abnormal.  Uses walker to ambulate. Wide based support  Psychiatric: She has a normal mood and affect. Her speech is delayed.  Nursing note and vitals reviewed.   Motor strength is 4/5 in the left deltoid, bicep, triceps, grip, hip flexor, knee extensor, ankle dorsiflexor 5/5 in the right deltoid, bicep, tricep, hip flexor, knee extensor, ankle dorsiflexor. Sensation is intact to light touch bilateral upper and lower limbs. Speech is without dysarthria. I negative. She is oriented to person and place. She has a slight delay when answering questions. Visual fields are intact confrontation testing      Assessment & Plan:  1. Bilateral cortical and subcortical infarcts, largest being right pontine with residual left hemiparesis. She also has some mild residual cognitive deficits. I think she has plateaued from a functional standpoint following her stroke. Do not think any additional outpatient therapy would be helpful in making any further improvements in her functional status. She is more or less at a intermittent supervision level. No physical assist needed at this time.  No driving for now, neurology to evaluate in 1 month.

## 2016-05-15 ENCOUNTER — Encounter: Payer: Self-pay | Admitting: Nurse Practitioner

## 2016-05-15 ENCOUNTER — Ambulatory Visit (INDEPENDENT_AMBULATORY_CARE_PROVIDER_SITE_OTHER): Payer: Medicaid Other | Admitting: Nurse Practitioner

## 2016-05-15 VITALS — BP 142/93 | HR 78 | Wt 240.0 lb

## 2016-05-15 DIAGNOSIS — I69359 Hemiplegia and hemiparesis following cerebral infarction affecting unspecified side: Secondary | ICD-10-CM

## 2016-05-15 DIAGNOSIS — I69398 Other sequelae of cerebral infarction: Secondary | ICD-10-CM | POA: Diagnosis not present

## 2016-05-15 DIAGNOSIS — I10 Essential (primary) hypertension: Secondary | ICD-10-CM | POA: Diagnosis not present

## 2016-05-15 DIAGNOSIS — I610 Nontraumatic intracerebral hemorrhage in hemisphere, subcortical: Secondary | ICD-10-CM

## 2016-05-15 DIAGNOSIS — R269 Unspecified abnormalities of gait and mobility: Secondary | ICD-10-CM

## 2016-05-15 NOTE — Progress Notes (Signed)
[facility]  [name] DOB: March 27, 1965   REASON FOR VISIT: Follow-up for history of stroke June 2017 HISTORY FROM: Patient and husband    HISTORY OF PRESENT ILLNESS:Ms Gwendolyn Morales is a 67 year Caucasian lady seen today for the first office follow-up visit for in-hospital admission for stroke in June 2017. She is accompanied by husband. Gwendolyn Morales is an 51 y.o. female with a history of hypertension and noncompliance with treatment brought to the ED and code stroke status following acute onset of a physical fall at home and afterwards having difficulty with speech output as well as right-sided weakness. She has no previous history of stroke nor TIA. She has not been on antiplatelet therapy. Blood pressure was markedly elevated at 280/150, per EMS. Blood pressure remained elevated at 235/122 after 10 mg of labetalol 2. Cardene drip was started. CT scan of her head showed a 1.7 x 1.3 cm acute left parietal hemorrhage, as well as a 0.6 cm hemorrhage involving the left lateral pons. NIH stroke score was 10. ICH score 3. She was LKW at 8:15 PM on 07/12/2015. Patient was not administered IV t-PA secondary to ICH. She was admitted to the neuro ICU for further evaluation and treatment.She was intubated and admitted to the ICU. Elevated BP was treated with cardene, then changed to cleviprex. Remains elevated after addition of oral agents. Renal consult felt it was due to hemorrhage and long-standing HTN. 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 Treated with Vanc and Zosyn for PNA. Trach placed and weaned off vent. Discharged to CIR for ongoing therapy. MRI scan of the brain showed multifocal micro-bleeds microinfarcts raising question of hypertensive small vessel disease versus vasculitis. LP was considered but not done since patient was not stable. Transthoracic echo showed normal ejection fraction. Renal artery ultrasound showed small right kidney with lack of  flow in the right renal artery. 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 Patient did well in inpatient rehabilitation and has been discharged home. She has recently finished home physical and occupational therapy. She is able to walk with a walker now. She is careful and has had only one minor fall. 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 She had an episode 3 weeks ago when she had weakness on the left arm and leg. She did not seek medical help as she does not have health insurance. The symptoms resolved within a few days. Patient has applied for Medicaid but is not yet approved. She states her blood pressure much better control now and today it is 137/90. She has seen Dr. Illa Level in the rehabilitation clinic and also has started seeing apractitioner in [place] for her primary care needs. A nurse practitioner in [place] for her primary care neeeds. UPDATE April 5/ 2018CM 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 She was last seen in this office 10/08/2015 by Dr. [name]. All of her therapies have concluded. She was seeing Dr. Wynn Banker but he has discharged her  She continues to do home exercise program. She walks in the home with her walker. She continues to have hip flexor weakness and residual left hemiparesis Husband checks blood pressure at home normal Leigh ranges between 135-140 systolic. She remains on aspirin for secondary stroke  prevention. She now has insurance and can obtain  MRI. She returns for reevaluation  REVIEW OF SYSTEMS: Full 14 system review of systems performed and notable only for those listed, all others are neg:  Constitutional: Chills  Cardiovascular: neg Ear/Nose/Throat: neg  Skin: neg Eyes:  neg Respiratory: neg Gastroitestinal: neg  Hematology/Lymphatic: neg  Endocrine: neg Musculoskeletal:neg Allergy/Immunology: Environmental allergies Neurological: neg Psychiatric: neg Sleep : neg   ALLERGIES: No Known Allergies  HOME MEDICATIONS: Outpatient Medications Prior to Visit  Medication Sig Dispense Refill  . amLODipine (NORVASC) 10 MG tablet Take 1 tablet (10 mg total) by mouth daily. 30 tablet 0  . aspirin EC 81 MG tablet Take 81 mg by mouth daily.    . busPIRone (BUSPAR) 5 MG tablet Take 1.5 tablets (7.5 mg total) by mouth 2 (two) times daily. 60 tablet 0  . ferrous sulfate 325 (65 FE) MG tablet Take 1 tablet (325 mg total) by mouth daily with breakfast. 30 tablet 0  . guaiFENesin (MUCINEX) 600 MG 12 hr tablet Take by mouth 2 (two) times daily as needed.     . hydrALAZINE (APRESOLINE) 50 MG tablet Take 1 tablet (50 mg total) by mouth every 8 (eight) hours. 90 tablet 0  . hydrochlorothiazide (MICROZIDE) 12.5 MG capsule Take 1 capsule (12.5 mg total) by mouth daily. 30 capsule 0  . metoprolol (LOPRESSOR) 100 MG tablet TAKE ONE TABLET BY MOUTH TWICE DAILY 60 tablet 0  . Multiple Vitamin (MULTIVITAMIN WITH MINERALS) TABS tablet Take 1 tablet by mouth daily.    . potassium chloride SA (K-DUR,KLOR-CON) 20 MEQ tablet Take 1 tablet (20 mEq total) by mouth daily. 30 tablet 0  . senna-docusate (SENOKOT-S) 8.6-50 MG tablet Take 2 tablets by mouth 2 (two) times daily. 100 tablet 0   No facility-administered medications prior to visit.     PAST MEDICAL HISTORY: Past Medical History:  Diagnosis Date  . Hypertension   . Stroke [facility]) 07/2015    PAST SURGICAL HISTORY: Past Surgical History:  Procedure Laterality Date  . CESAREAN SECTION     x 2    FAMILY HISTORY: Family History  Problem Relation Age of Onset  . Diabetes Mother   . Hypertension Sister     SOCIAL HISTORY: Social History   Social History  . Marital status: Married    Spouse name: Gwendolyn Morales  . Number  of children: 2  . Years of education: N/A   Occupational History  . Not on file.   Social History Main Topics  . Smoking status: Never Smoker  . Smokeless tobacco: Never Used  . Alcohol use No  . Drug use: No  . Sexual activity: Not on file   Other Topics Concern  . Not on file   Social History Narrative   Lives with husband, child     PHYSICAL EXAM  Vitals:   05/15/16 1059  BP: (!) 142/93  Pulse: 78  Weight: 240 lb (108.9 kg)   Body mass index is 36.49 kg/m.  Generalized: Well developed, Obese female in no acute distress  Head: normocephalic and atraumatic,. Oropharynx benign  Neck: Supple, no carotid bruits  Cardiac: Regular rate rhythm, no murmur  Musculoskeletal: No deformity   Neurological examination   Mentation: Alert oriented to time, place, history taking. Attention span and concentration appropriate. Recent and remote memory intact.  Follows all commands speech and language fluent. Voice is hoarse  Cranial nerve II-XII: Fundoscopic exam reveals sharp disc margins.Pupils were equal round reactive to light extraocular movements  were full, visual field were full on confrontational test. Facial sensation and strength were normal. hearing was intact to finger rubbing bilaterally. Uvula tongue midline. head turning and shoulder shrug were normal and symmetric.Tongue protrusion into cheek strength was normal. Motor: normal bulk and tone, full strength in the BUE, BLE, on the right, 4 out of 5 left t upper and lower extremity, hip flexor 4 out of 5 fine finger movements normal, no pronator drift. No focal weakness Sensory: normal and symmetric to light touch, pinprick, and  Vibration, in the upper and lower extremities  Coordination: finger-nose-finger, heel-to-shin bilaterally, no dysmetria on the right and mild difficulty with heel-to-shin on the left Reflexes: 1+ upper lower and symmetric, plantar responses were flexor bilaterally. Gait and Station: Not ambulated  patient in wheelchair  DIAGNOSTIC DATA (LABS, IMAGING, TESTING) - I reviewed patient records, labs, notes, testing and imaging myself where available.  Lab Results  Component Value Date   WBC 5.2 08/16/2015   HGB 9.4 (L) 08/16/2015   HCT 30.3 (L) 08/16/2015   MCV 76.3 (L) 08/16/2015   PLT 255 08/16/2015      Component Value Date/Time   NA 138 08/16/2015 0601   K 3.4 (L) 08/16/2015 0601   CL 104 08/16/2015 0601   CO2 27 08/16/2015 0601   GLUCOSE 96 08/16/2015 0601   BUN 14 08/16/2015 0601   CREATININE 1.23 (H) 08/16/2015 0601   CALCIUM 9.5 08/16/2015 0601   PROT 6.6 08/07/2015 0521   ALBUMIN 3.3 (L) 08/07/2015 0521   AST 22 08/07/2015 0521   ALT 38 08/07/2015 0521   ALKPHOS 134 (H) 08/07/2015 0521   BILITOT 0.9 08/07/2015 0521   GFRNONAA 51 (L) 08/16/2015 0601   GFRAA 59 (L) 08/16/2015 0601   Lab Results  Component Value Date   CHOL 187 07/13/2015   HDL 60 07/13/2015   LDLCALC 107 (H) 07/13/2015   TRIG 525 (H) 07/22/2015   CHOLHDL 3.1 07/13/2015   Lab Results  Component Value Date   HGBA1C 5.5 07/13/2015    ASSESSMENT AND PLAN 63 year Caucasian lady with left parietal and pontine intracerebral hemorrhages in June 2017 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 She continues with left-sided hemiparesis which has improved. She now has insurance will obtain MRI. The patient is a current patient of Dr. [name]  who is out of the office today . This note is sent to the work in doctor.       Continue  Aspirin for secondary stroke  Prevention  continue to monitor blood pressure goal below 130/90,  diabetes with hemoglobin A1c goal below 6.5%  lipids with LDL cholesterol goal below 70 mg/dL.  eat a healthy diet with plenty of whole grains, cereals, fruits and vegetables,  HEP  Will repeat MRI of the brain follow-up previous stroke No driving at present F/U 6 months Greater than 50% of time during this 25 minute visit was spent on counseling,explanation of diagnosis, review of  the medical record planning of further management, discussion with patient and husband and coordination of care. Nilda Riggs, [facility], [facility], APRN  [facility] [address], [address] [place], [place] [address] [phone]

## 2016-05-15 NOTE — Patient Instructions (Signed)
Continue  Aspirin for secondary stroke  Prevention  continue to monitor blood pressure goal below 130/90,  diabetes with hemoglobin A1c goal below 6.5%  lipids with LDL cholesterol goal below 70 mg/dL.  eat a healthy diet with plenty of whole grains, cereals, fruits and vegetables,  HEP  Will repeat MRI of the brain No driving at present F/U 6 months

## 2016-05-16 NOTE — Progress Notes (Signed)
I have read the note, and I agree with the clinical assessment and plan.  Ashlinn Hemrick A. Hartlyn Reigel, MD, PhD Certified in Neurology, Clinical Neurophysiology, Sleep Medicine, Pain Medicine and Neuroimaging  [facility] [address], [address] [place], [place] [address] [phone]  

## 2016-07-23 ENCOUNTER — Inpatient Hospital Stay
Admission: RE | Admit: 2016-07-23 | Discharge: 2016-07-23 | Disposition: A | Discharge status: Home / Self Care | Payer: Medicaid Other | Source: Ambulatory Visit | Attending: Nurse Practitioner | Admitting: Nurse Practitioner

## 2016-07-23 DIAGNOSIS — I10 Essential (primary) hypertension: Secondary | ICD-10-CM

## 2016-07-23 DIAGNOSIS — I610 Nontraumatic intracerebral hemorrhage in hemisphere, subcortical: Secondary | ICD-10-CM

## 2016-08-06 ENCOUNTER — Telehealth: Payer: Self-pay | Admitting: Nurse Practitioner

## 2016-08-06 ENCOUNTER — Other Ambulatory Visit: Payer: Self-pay | Admitting: *Deleted

## 2016-08-06 ENCOUNTER — Ambulatory Visit
Admission: RE | Admit: 2016-08-06 | Discharge: 2016-08-06 | Disposition: A | Discharge status: Home / Self Care | Payer: Medicaid Other | Source: Ambulatory Visit | Attending: Nurse Practitioner | Admitting: Nurse Practitioner

## 2016-08-06 ENCOUNTER — Inpatient Hospital Stay: Admission: RE | Admit: 2016-08-06 | Payer: Medicaid Other | Source: Ambulatory Visit

## 2016-08-06 DIAGNOSIS — I610 Nontraumatic intracerebral hemorrhage in hemisphere, subcortical: Secondary | ICD-10-CM

## 2016-08-06 MED ORDER — GADOBENATE DIMEGLUMINE 529 MG/ML IV SOLN
20.0000 mL | Freq: Once | INTRAVENOUS | Status: AC | PRN
  Administered 2016-08-06: 20 mL via INTRAVENOUS

## 2016-08-06 NOTE — Telephone Encounter (Signed)
I called Drenda FreezeFran and informed her the order has been put back in.

## 2016-08-06 NOTE — Telephone Encounter (Signed)
Hey I have Drenda FreezeFran from [facility] calling re: [name]  she accidently removed the orders and is asking if they can be put back in. Pt is to come there at 11 .  Drenda FreezeFran can be reached directly at [phone]

## 2016-11-26 ENCOUNTER — Ambulatory Visit (INDEPENDENT_AMBULATORY_CARE_PROVIDER_SITE_OTHER): Payer: Medicaid Other | Admitting: Nurse Practitioner

## 2016-11-26 ENCOUNTER — Encounter: Payer: Self-pay | Admitting: Nurse Practitioner

## 2016-11-26 VITALS — BP 135/86 | HR 72 | Ht 68.0 in | Wt 238.4 lb

## 2016-11-26 DIAGNOSIS — I611 Nontraumatic intracerebral hemorrhage in hemisphere, cortical: Secondary | ICD-10-CM

## 2016-11-26 DIAGNOSIS — I69359 Hemiplegia and hemiparesis following cerebral infarction affecting unspecified side: Secondary | ICD-10-CM

## 2016-11-26 DIAGNOSIS — R269 Unspecified abnormalities of gait and mobility: Secondary | ICD-10-CM

## 2016-11-26 DIAGNOSIS — I10 Essential (primary) hypertension: Secondary | ICD-10-CM | POA: Diagnosis not present

## 2016-11-26 DIAGNOSIS — I69398 Other sequelae of cerebral infarction: Secondary | ICD-10-CM | POA: Diagnosis not present

## 2016-11-26 NOTE — Patient Instructions (Addendum)
Continue  Aspirin for secondary stroke  Prevention  continue to monitor blood pressure goal below 130/90, todays reading 135/86 continue B/P meds diabetes with hemoglobin A1c goal below 6.5% followed by PCP  lipids with LDL cholesterol goal below 70 mg/dL.Followed by PCP  eat a healthy diet with plenty of whole grains, cereals, fruits and vegetables,  Home exercise program  MRI of the brain with expected evolutional changes. Otherwise no new findings.  Continue to use walker for safe ambulation  No further stroke symptoms will discharge Stroke Prevention Some medical conditions and behaviors are associated with an increased chance of having a stroke. You may prevent a stroke by making healthy choices and managing medical conditions. How can I reduce my risk of having a stroke?  Stay physically active. Get at least 30 minutes of activity on most or all days.  Do not smoke. It may also be helpful to avoid exposure to secondhand smoke.  Limit alcohol use. Moderate alcohol use is considered to be: ? No more than 2 drinks per day for men. ? No more than 1 drink per day for nonpregnant women.  Eat healthy foods. This involves: ? Eating 5 or more servings of fruits and vegetables a day. ? Making dietary changes that address high blood pressure (hypertension), high cholesterol, diabetes, or obesity.  Manage your cholesterol levels. ? Making food choices that are high in fiber and low in saturated fat, trans fat, and cholesterol may control cholesterol levels. ? Take any prescribed medicines to control cholesterol as directed by your health care provider.  Manage your diabetes. ? Controlling your carbohydrate and sugar intake is recommended to manage diabetes. ? Take any prescribed medicines to control diabetes as directed by your health care provider.  Control your hypertension. ? Making food choices that are low in salt (sodium), saturated fat, trans fat, and cholesterol is recommended to  manage hypertension. ? Ask your health care provider if you need treatment to lower your blood pressure. Take any prescribed medicines to control hypertension as directed by your health care provider. ? If you are 61-42 years of age, have your blood pressure checked every 3-5 years. If you are 14 years of age or older, have your blood pressure checked every year.  Maintain a healthy weight. ? Reducing calorie intake and making food choices that are low in sodium, saturated fat, trans fat, and cholesterol are recommended to manage weight.  Stop drug abuse.  Avoid taking birth control pills. ? Talk to your health care provider about the risks of taking birth control pills if you are over 23 years old, smoke, get migraines, or have ever had a blood clot.  Get evaluated for sleep disorders (sleep apnea). ? Talk to your health care provider about getting a sleep evaluation if you snore a lot or have excessive sleepiness.  Take medicines only as directed by your health care provider. ? For some people, aspirin or blood thinners (anticoagulants) are helpful in reducing the risk of forming abnormal blood clots that can lead to stroke. If you have the irregular heart rhythm of atrial fibrillation, you should be on a blood thinner unless there is a good reason you cannot take them. ? Understand all your medicine instructions.  Make sure that other conditions (such as anemia or atherosclerosis) are addressed. Get help right away if:  You have sudden weakness or numbness of the face, arm, or leg, especially on one side of the body.  Your face or eyelid droops to  one side.  You have sudden confusion.  You have trouble speaking (aphasia) or understanding.  You have sudden trouble seeing in one or both eyes.  You have sudden trouble walking.  You have dizziness.  You have a loss of balance or coordination.  You have a sudden, severe headache with no known cause.  You have new chest pain or  an irregular heartbeat. Any of these symptoms may represent a serious problem that is an emergency. Do not wait to see if the symptoms will go away. Get medical help at once. Call your local emergency services (911 in [place] Do not drive yourself to the hospital. This information is not intended to replace advice given to you by your health care provider. Make sure you discuss any questions you have with your health care provider. Document Released: 03/06/2004 Document Revised: 07/05/2015 Document Reviewed: 07/30/2012 Elsevier Interactive Patient Education  2017 [employer].  Mediterranean Diet A Mediterranean diet refers to food and lifestyle choices that are based on the traditions of countries located on the [employer]. This way of eating has been shown to help prevent certain conditions and improve outcomes for people who have chronic diseases, like kidney disease and heart disease. What are tips for following this plan? Lifestyle  Cook and eat meals together with your family, when possible.  Drink enough fluid to keep your urine clear or pale yellow.  Be physically active every day. This includes: ? Aerobic exercise like running or swimming. ? Leisure activities like gardening, walking, or housework.  Get 7-8 hours of sleep each night.  If recommended by your health care provider, drink red wine in moderation. This means 1 glass a day for nonpregnant women and 2 glasses a day for men. A glass of wine equals 5 oz (150 mL). Reading food labels  Check the serving size of packaged foods. For foods such as rice and pasta, the serving size refers to the amount of cooked product, not dry.  Check the total fat in packaged foods. Avoid foods that have saturated fat or trans fats.  Check the ingredients list for added sugars, such as corn syrup. Shopping  At the grocery store, buy most of your food from the areas near the walls of the store. This includes: ? Fresh fruits and  vegetables (produce). ? Grains, beans, nuts, and seeds. Some of these may be available in unpackaged forms or large amounts (in bulk). ? Fresh seafood. ? Poultry and eggs. ? Low-fat dairy products.  Buy whole ingredients instead of prepackaged foods.  Buy fresh fruits and vegetables in-season from local farmers markets.  Buy frozen fruits and vegetables in resealable bags.  If you do not have access to quality fresh seafood, buy precooked frozen shrimp or canned fish, such as tuna, salmon, or sardines.  Buy small amounts of raw or cooked vegetables, salads, or olives from the deli or salad bar at your store.  Stock your pantry so you always have certain foods on hand, such as olive oil, canned tuna, canned tomatoes, rice, pasta, and beans. Cooking  Cook foods with extra-virgin olive oil instead of using butter or other vegetable oils.  Have meat as a side dish, and have vegetables or grains as your main dish. This means having meat in small portions or adding small amounts of meat to foods like pasta or stew.  Use beans or vegetables instead of meat in common dishes like chili or lasagna.  Experiment with different cooking methods. Try roasting or  broiling vegetables instead of steaming or sauteing them.  Add frozen vegetables to soups, stews, pasta, or rice.  Add nuts or seeds for added healthy fat at each meal. You can add these to yogurt, salads, or vegetable dishes.  Marinate fish or vegetables using olive oil, lemon juice, garlic, and fresh herbs. Meal planning  Plan to eat 1 vegetarian meal one day each week. Try to work up to 2 vegetarian meals, if possible.  Eat seafood 2 or more times a week.  Have healthy snacks readily available, such as: ? Vegetable sticks with hummus. ? [place] yogurt. ? Fruit and nut trail mix.  Eat balanced meals throughout the week. This includes: ? Fruit: 2-3 servings a day ? Vegetables: 4-5 servings a day ? Low-fat dairy: 2 servings a  day ? Fish, poultry, or lean meat: 1 serving a day ? Beans and legumes: 2 or more servings a week ? Nuts and seeds: 1-2 servings a day ? Whole grains: 6-8 servings a day ? Extra-virgin olive oil: 3-4 servings a day  Limit red meat and sweets to only a few servings a month What are my food choices?  Mediterranean diet ? Recommended ? Grains: Whole-grain pasta. Brown rice. Bulgar wheat. Polenta. Couscous. Whole-wheat bread. Orpah Cobbatmeal. Quinoa. ? Vegetables: Artichokes. Beets. Broccoli. Cabbage. Carrots. Eggplant. Green beans. Chard. Kale. Spinach. Onions. Leeks. Peas. Squash. Tomatoes. Peppers. Radishes. ? Fruits: Apples. Apricots. Avocado. Berries. Bananas. Cherries. Dates. Figs. Grapes. Lemons. Melon. Oranges. Peaches. Plums. Pomegranate. ? Meats and other protein foods: Beans. Almonds. Sunflower seeds. Pine nuts. Peanuts. Cod. Salmon. Scallops. Shrimp. Tuna. Tilapia. Clams. Oysters. Eggs. ? Dairy: Low-fat milk. Cheese. Greek yogurt. ? Beverages: Water. Red wine. Herbal tea. ? Fats and oils: Extra virgin olive oil. Avocado oil. Grape seed oil. ? Sweets and desserts: [place] yogurt with honey. Baked apples. Poached pears. Trail mix. ? Seasoning and other foods: Basil. Cilantro. Coriander. Cumin. Mint. Parsley. Sage. Rosemary. Tarragon. Garlic. Oregano. Thyme. Pepper. Balsalmic vinegar. Tahini. Hummus. Tomato sauce. Olives. Mushrooms. ? Limit these ? Grains: Prepackaged pasta or rice dishes. Prepackaged cereal with added sugar. ? Vegetables: Deep fried potatoes (french fries). ? Fruits: Fruit canned in syrup. ? Meats and other protein foods: Beef. Pork. Lamb. Poultry with skin. Hot dogs. Tomasa BlaseBacon. ? Dairy: Ice cream. Sour cream. Whole milk. ? Beverages: Juice. Sugar-sweetened soft drinks. Beer. Liquor and spirits. ? Fats and oils: Butter. Canola oil. Vegetable oil. Beef fat (tallow). Lard. ? Sweets and desserts: Cookies. Cakes. Pies. Candy. ? Seasoning and other foods: Mayonnaise. Premade sauces  and marinades. ? The items listed may not be a complete list. Talk with your dietitian about what dietary choices are right for you. Summary  The Mediterranean diet includes both food and lifestyle choices.  Eat a variety of fresh fruits and vegetables, beans, nuts, seeds, and whole grains.  Limit the amount of red meat and sweets that you eat.  Talk with your health care provider about whether it is safe for you to drink red wine in moderation. This means 1 glass a day for nonpregnant women and 2 glasses a day for men. A glass of wine equals 5 oz (150 mL). This information is not intended to replace advice given to you by your health care provider. Make sure you discuss any questions you have with your health care provider. Document Released: 09/20/2015 Document Revised: 10/23/2015 Document Reviewed: 09/20/2015 Elsevier Interactive Patient Education  [employer]2018 Elsevier Inc.

## 2016-11-26 NOTE — Progress Notes (Addendum)
[facility]  [name] DOB: 1965-04-27   REASON FOR VISIT: Follow-up for history of stroke June 2017 HISTORY FROM: Patient and husband    HISTORY OF PRESENT ILLNESS:Gwendolyn Morales is a [age] year Caucasian lady seen today for the first office follow-up visit for in-hospital admission for stroke in June 2017. She is accompanied by husband. Gwendolyn Morales is an 51 y.o. female with a history of hypertension and noncompliance with treatment brought to the ED and code stroke status following acute onset of a physical fall at home and afterwards having difficulty with speech output as well as right-sided weakness. She has no previous history of stroke nor TIA. She has not been on antiplatelet therapy. Blood pressure was markedly elevated at 280/150, per EMS. Blood pressure remained elevated at 235/122 after 10 mg of labetalol 2. Cardene drip was started. CT scan of her head showed a 1.7 x 1.3 cm acute left parietal hemorrhage, as well as a 0.6 cm hemorrhage involving the left lateral pons. NIH stroke score was 10. ICH score 3. She was LKW at 8:15 PM on 07/12/2015. Patient was not administered IV t-PA secondary to ICH. She was admitted to the neuro ICU for further evaluation and treatment.She was intubated and admitted to the ICU. Elevated BP was treated with cardene, then changed to cleviprex. Remains elevated after addition of oral agents. Renal consult felt it was due to hemorrhage and long-standing HTN. 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 Treated with Vanc and Zosyn for PNA. Trach placed and weaned off vent. Discharged to CIR for ongoing therapy. MRI scan of the brain showed multifocal micro-bleeds microinfarcts raising question of hypertensive small vessel disease versus vasculitis. LP was considered but not done since patient was not stable. Transthoracic echo showed normal ejection fraction. Renal artery ultrasound showed small right kidney with lack of  flow in the right renal artery. 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 Patient did well in inpatient rehabilitation and has been discharged home. She has recently finished home physical and occupational therapy. She is able to walk with a walker now. She is careful and has had only one minor fall. 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 She had an episode 3 weeks ago when she had weakness on the left arm and leg. She did not seek medical help as she does not have health insurance. The symptoms resolved within a few days. Patient has applied for Medicaid but is not yet approved. She states her blood pressure much better control now and today it is 137/90. She has seen Dr. Illa LevelKersten's in the rehabilitation clinic and also has started seeing apractitioner in [place] for her primary care needs. A nurse practitioner in [place] for her primary care neeeds. 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 She was last seen in this office 10/08/2015 by Dr. [name]. All of her therapies have concluded. She was seeing Dr. Wynn BankerKirsteins but he has discharged her  She continues to do home exercise program. She walks in the home with her walker. She continues to have hip flexor weakness and residual left hemiparesis Husband checks blood pressure at home normal Leigh ranges between 135-140 systolic. She remains on aspirin for secondary stroke  prevention. She now has insurance and can obtain  MRI. 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 She remains on aspirin for secondary stroke prevention with minimal  bruising and no signs of bleeding. Blood pressure in the office today 135/86. She has regular follow-up with her primary care Dr. Shary Decamp. She continues to do her home exercise program. She is seated in a wheelchair today and ambulates with a walker at home .When last seen she had insurance so we can obtain an MRI 08/07/16 .Abnormal MRI brain (with and without) demonstrating: 1. Chronic left fronto-parietal intracerebral hemorrhage. 2. Multiple chronic cerebral microhemorrhages in the right basal ganglia, left pons and bilateral cerebellum. 3. Mild periventricular and subcortical and pontine chronic small vessel ischemic disease. 4. No acute findings. 5. Compared to MRI on 07/17/15, the left fronto-parietal chronic hemorrhage has reduced in size with expected evolution changes. Otherwise no new findings, she returns for reevaluation. She reports no interval medical issues except a low iron from heavy  menstrual cycles . REVIEW OF SYSTEMS: Full 14 system review of systems performed and notable only for those listed, all others are neg:  Constitutional: Chills  Cardiovascular: leg swelling Ear/Nose/Throat: neg  Skin: neg Eyes: neg Respiratory: neg Gastroitestinal: neg  Hematology/LymphatiLow iron Endocrine: neg Musculoskeletal:neg Allergy/Immunology: Environmental allergies Neurological: neg Psychiatric: neg Sleep : neg   ALLERGIES: No Known Allergies  HOME MEDICATIONS: Outpatient Medications Prior to Visit  Medication Sig Dispense Refill  . amLODipine (NORVASC) 10 MG tablet Take 1 tablet (10 mg total) by mouth daily. 30 tablet 0  . aspirin EC 81 MG tablet Take 81 mg by mouth daily.    . busPIRone (BUSPAR) 5 MG tablet Take 1.5 tablets (7.5 mg total) by mouth 2 (two) times daily. 60 tablet 0  . ferrous sulfate 325 (65 FE) MG tablet Take 1 tablet (325 mg total) by mouth daily with breakfast. 30 tablet 0  . guaiFENesin (MUCINEX) 600 MG 12 hr tablet Take by mouth 2 (two) times daily as  needed.     . hydrALAZINE (APRESOLINE) 50 MG tablet Take 1 tablet (50 mg total) by mouth every 8 (eight) hours. 90 tablet 0  . hydrochlorothiazide (MICROZIDE) 12.5 MG capsule Take 1 capsule (12.5 mg total) by mouth daily. 30 capsule 0  . metoprolol (LOPRESSOR) 100 MG tablet TAKE ONE TABLET BY MOUTH TWICE DAILY 60 tablet 0  . Multiple Vitamin (MULTIVITAMIN WITH MINERALS) TABS tablet Take 1 tablet by mouth daily.    . potassium chloride SA (K-DUR,KLOR-CON) 20 MEQ tablet Take 1 tablet (20 mEq total) by mouth daily. 30 tablet 0  . senna-docusate (SENOKOT-S) 8.6-50 MG tablet Take 2 tablets by mouth 2 (two) times daily. (Patient not taking: Reported on 11/26/2016) 100 tablet 0   No facility-administered medications prior to visit.     PAST MEDICAL HISTORY: Past Medical History:  Diagnosis Date  . Hypertension   . Stroke [facility]) 07/2015    PAST SURGICAL HISTORY: Past Surgical History:  Procedure Laterality Date  . CESAREAN SECTION     x 2    FAMILY HISTORY: Family History  Problem Relation Age of Onset  . Diabetes Mother   . Hypertension Sister     SOCIAL HISTORY: Social History   Social History  . Marital status: Married    Spouse name: [name]  . Number of children: 2  . Years of education: N/A   Occupational  History  . Not on file.   Social History Main Topics  . Smoking status: Never Smoker  . Smokeless tobacco: Never Used  . Alcohol use No  . Drug use: No  . Sexual activity: Not on file   Other Topics Concern  . Not on file   Social History Narrative   Lives with husband, child     PHYSICAL EXAM  Vitals:   11/26/16 1057  BP: 135/86  Pulse: 72  Weight: 238 lb 6.4 oz (108.1 kg)  Height: 5\' 8"  (1.727 m)   Body mass index is 36.25 kg/m.  Generalized: Well developed, Obese female in no acute distress  Head: normocephalic and atraumatic,. Oropharynx benign  Neck: Supple, no carotid bruits  Cardiac: Regular rate rhythm, no murmur  Musculoskeletal: No  deformity   Neurological examination   Mentation: Alert oriented to time, place, history taking. Attention span and concentration appropriate. Recent and remote memory intact.  Follows all commands speech and language fluent. Voice is soft Cranial nerve II-XII: .Pupils were equal round reactive to light extraocular movements were full, visual field were full on confrontational test. Facial sensation and strength were normal. hearing was intact to finger rubbing bilaterally. Uvula tongue midline. head turning and shoulder shrug were normal and symmetric.Tongue protrusion into cheek strength was normal. Motor: normal bulk and tone, full strength in the BUE, BLE, on the right, 4 out of 5 left lower extremity, hip flexor 4 out of 5 fine finger movements normal, no pronator drift.  Sensory: normal and symmetric to light touch, pinprick, and  Vibration, in the upper and lower extremities  Coordination: finger-nose-finger, heel-to-shin bilaterally, no dysmetria on the right and mild difficulty with heel-to-shin on the left Reflexes: 1+ upper lower and symmetric, plantar responses were flexor bilaterally. Gait and Station Ambulated 30 feet in the hall with a rolling walker slow steady gait no difficulty with turns DAGNOSTIC DATA (LABS, IMAGING, TESTING) - I reviewed patient records, labs, notes, testing and imaging myself where available.  Lab Results  Component Value Date   WBC 5.2 08/16/2015   HGB 9.4 (L) 08/16/2015   HCT 30.3 (L) 08/16/2015   MCV 76.3 (L) 08/16/2015   PLT 255 08/16/2015      Component Value Date/Time   NA 138 08/16/2015 0601   K 3.4 (L) 08/16/2015 0601   CL 104 08/16/2015 0601   CO2 27 08/16/2015 0601   GLUCOSE 96 08/16/2015 0601   BUN 14 08/16/2015 0601   CREATININE 1.23 (H) 08/16/2015 0601   CALCIUM 9.5 08/16/2015 0601   PROT 6.6 08/07/2015 0521   ALBUMIN 3.3 (L) 08/07/2015 0521   AST 22 08/07/2015 0521   ALT 38 08/07/2015 0521   ALKPHOS 134 (H) 08/07/2015 0521    BILITOT 0.9 08/07/2015 0521   GFRNONAA 51 (L) 08/16/2015 0601   GFRAA 59 (L) 08/16/2015 0601   Lab Results  Component Value Date   CHOL 187 07/13/2015   HDL 60 07/13/2015   LDLCALC 107 (H) 07/13/2015   TRIG 525 (H) 07/22/2015   CHOLHDL 3.1 07/13/2015   Lab Results  Component Value Date   HGBA1C 5.5 07/13/2015    ASSESSMENT AND PLAN 38 year Caucasian lady with left parietal and pontine intracerebral hemorrhages in June 2017 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 She continues with left-sided hemiparesis which has improved.MRI of the brain 08/06/16 with expected evolutional changes. Otherwise no new findings.  The patient is a current patient of Dr. [name]  who is out of the office today .  This note is sent to the work in doctor.      PLAN: Continue  Aspirin for secondary stroke  Prevention  continue to monitor blood pressure goal below 130/90, todays reading 135/86 continue B/P meds diabetes with hemoglobin A1c goal below 6.5% followed by PCP  lipids with LDL cholesterol goal below 70 mg/dL.Followed by PCP  eat a healthy diet with plenty of whole grains, cereals, fruits and vegetables, lean meats chicken and fish Home exercise program at least 30 min of exercise daily MRI of the brain with expected evolutional changes. Otherwise no new findings.  Continue to use walker for safe ambulation  No further stroke symptoms will discharge  I spent 25 minutes in total face to face time with the patient more than 50% of which was spent counseling and coordination of care, reviewing test results reviewing medications and discussing and reviewing the diagnosis of stroke and management of risk factors and also Mediterranean diet and food options. Given copy of both. Answered additional questions  Nilda Riggs, [facility], [facility], APRN  [facility] [address], [address] [place], [place] [address] [phone]   I reviewed the above note and documentation by the Nurse  Practitioner and agree with the history, physical exam, assessment and plan as outlined above. I was immediately available for face-to-face consultation. Huston Foley, MD, PhD [facility])

## 2018-05-27 IMAGING — MR MR HEAD WO/W CM
11 of 13 series · 33 of 48 positions shown · IV contrast (multihance)
Comparison: 07/14/2015 head CT.  No comparison brain MR.

ADDENDUM:
Cavernomas felt to be less likely consideration than that of
hemorrhage from hypertensive microvascular changes. This was
discussed with Dr. Nomasibulele.
CLINICAL DATA: 50-year-old hypertensive female noncompliant with
treatment presenting with difficulty with speech or right-sided
weakness. Blood pressure 280/150. On anti-platelet therapy.
Subsequent encounter.

EXAM:
MRI HEAD WITHOUT AND WITH CONTRAST
TECHNIQUE: Multiplanar, multiecho pulse sequences of the brain and surrounding
structures were obtained without and with intravenous contrast.
CONTRAST:  20 cc MultiHance

[Series 2: FLAIR · sagittal · 5.0mm · 0.47mm/px · 1 of 22 slices shown (1 of 3)]
[im 1/22]
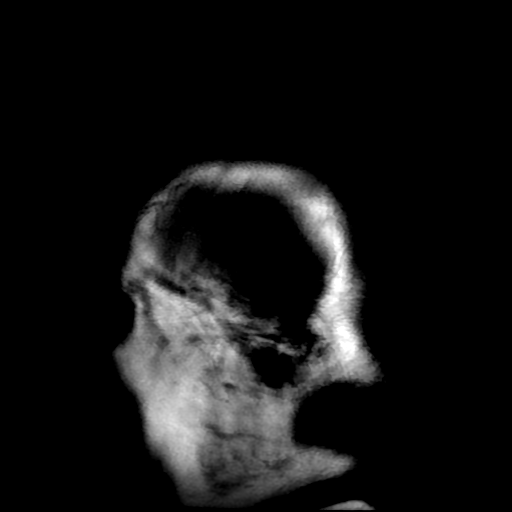

[Series 4: DWI · axial · 3.0mm · 0.94mm/px · z∈[-71,+74]mm · 8 of 100 slices shown (1 of 2)]
[im 1/100]
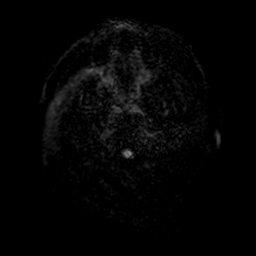
[im 15/100]
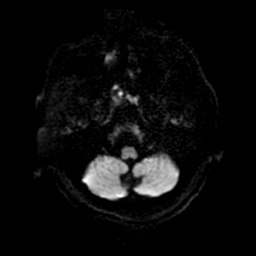
[im 29/100]
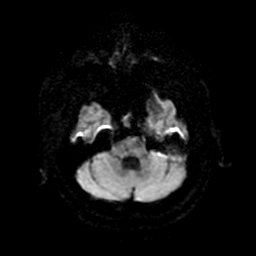
[im 43/100]
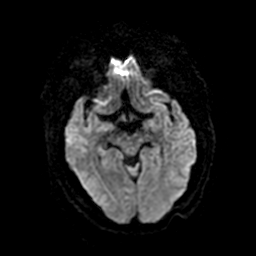
[im 57/100]
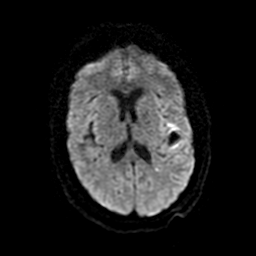
[im 71/100]
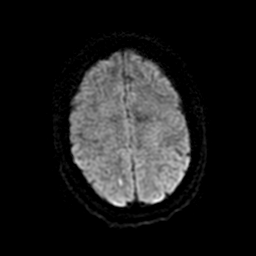
[im 85/100]
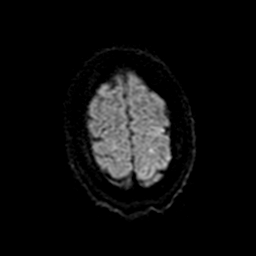
[im 100/100]
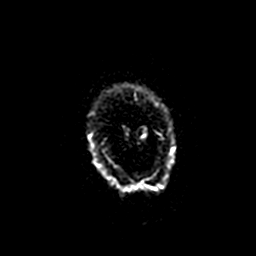

[Series 6: T2 · axial · 5.0mm · 0.43mm/px · z∈[-68,+75]mm · 2 of 25 slices shown (1 of 2)]
[im 1/25]
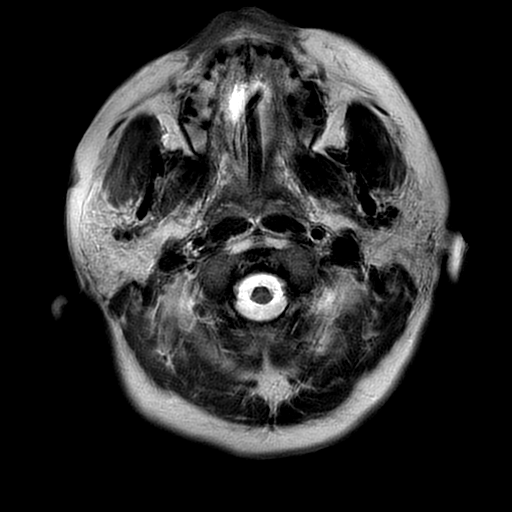
[im 25/25]
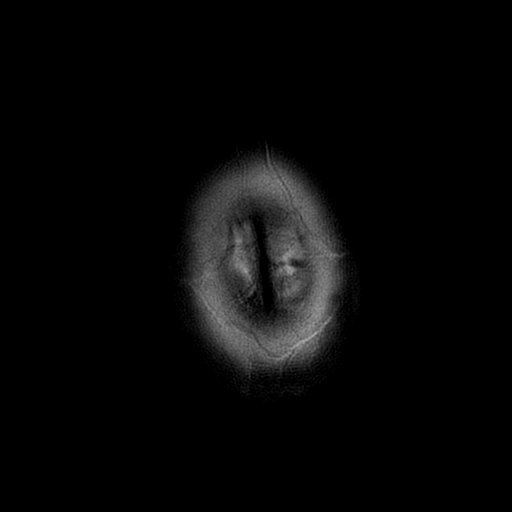

[Series 7: FLAIR · axial · 5.0mm · 0.86mm/px · z∈[-68,+75]mm · 2 of 25 slices shown (2 of 3)]
[im 1/25]
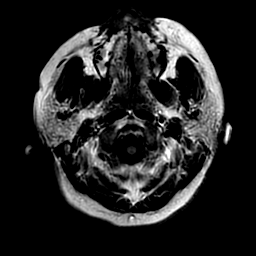
[im 25/25]
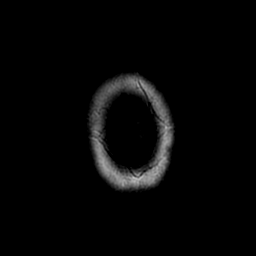

[Series 8: DWI · coronal · 4.0mm · 0.94mm/px · 6 of 70 slices shown (2 of 2)]
[im 1/70]
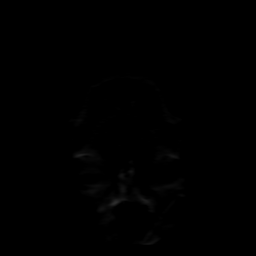
[im 14/70]
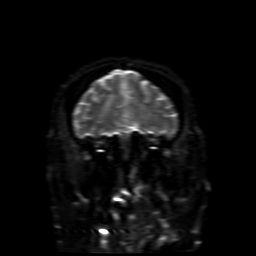
[im 28/70]
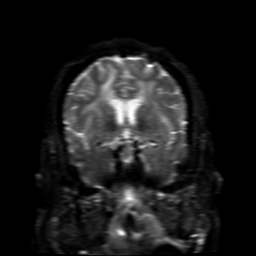
[im 42/70]
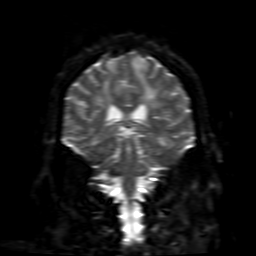
[im 56/70]
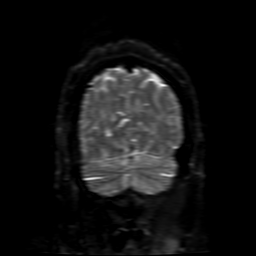
[im 70/70]
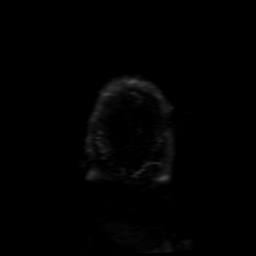

[Series 9: (person_name) · axial · 3.0mm · 0.47mm/px · 1 of 100 slices shown]
[im 1/100]
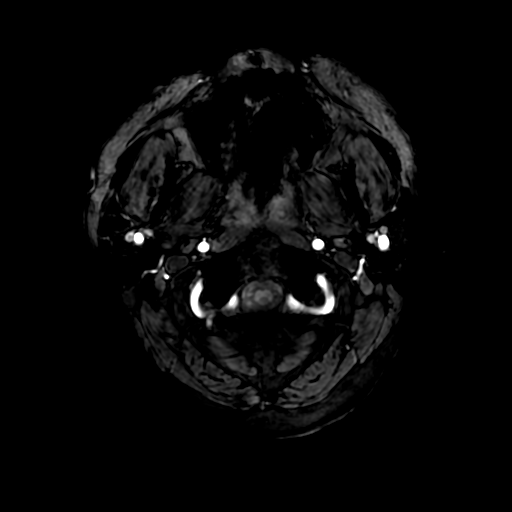

[Series 12: T2 · coronal · 5.0mm · 0.39mm/px · 2 of 29 slices shown (2 of 2)]
[im 1/29]
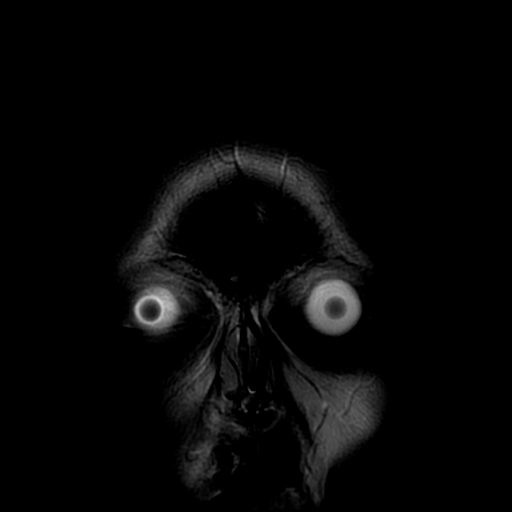
[im 29/29]
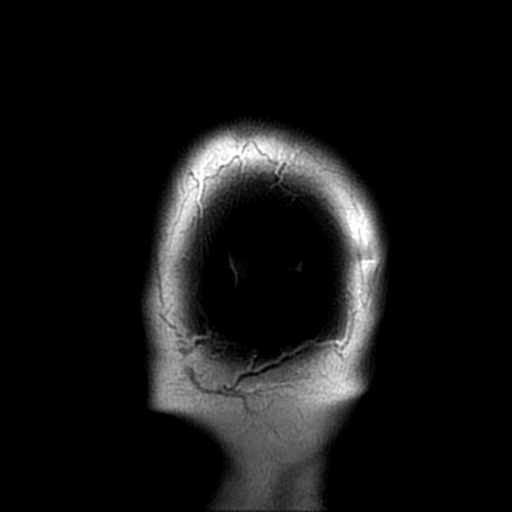

[Series 14: T1 · coronal · 5.0mm · 0.39mm/px · 2 of 29 slices shown]
[im 1/29]
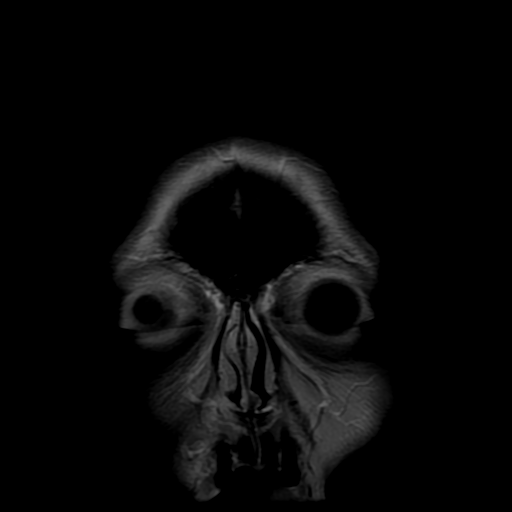
[im 29/29]
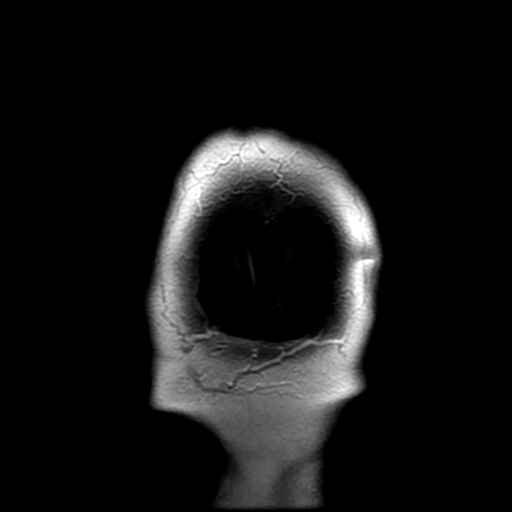

[Series 15: FLAIR · sagittal · 5.0mm · 0.47mm/px · 2 of 22 slices shown (3 of 3)]
[im 1/22]
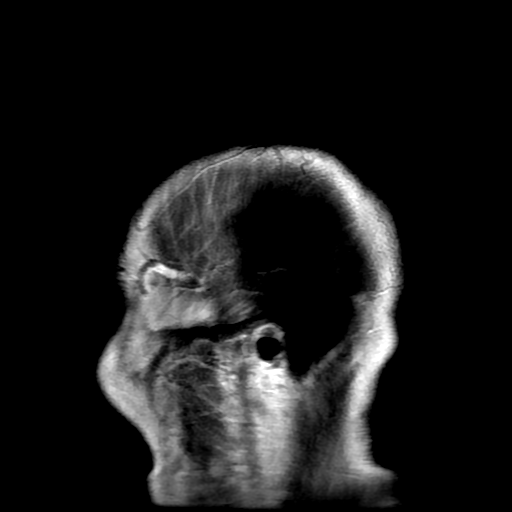
[im 22/22]
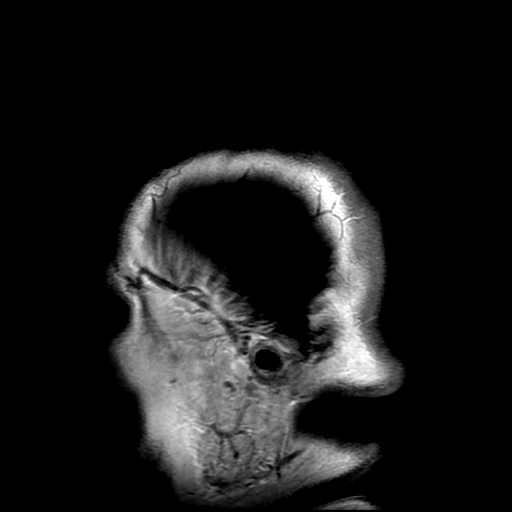

[Series 450: ADC · axial · 3.0mm · 0.94mm/px · z∈[-71,+74]mm · 4 of 50 slices shown (1 of 2)]
[im 1/50]
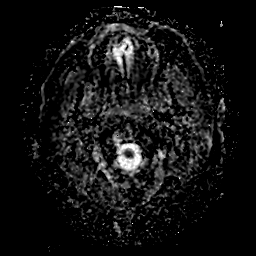
[im 17/50]
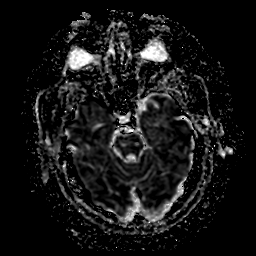
[im 33/50]
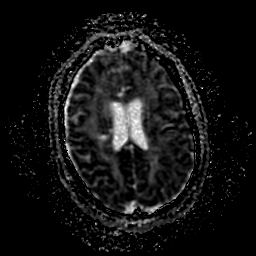
[im 50/50]
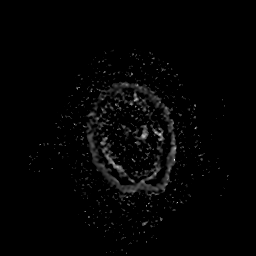

[Series 852: ADC · coronal · 4.0mm · 0.94mm/px · 3 of 35 slices shown (2 of 2)]
[im 1/35]
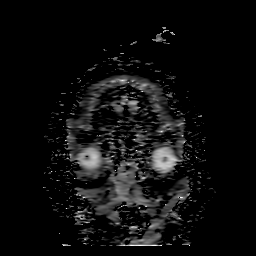
[im 18/35]
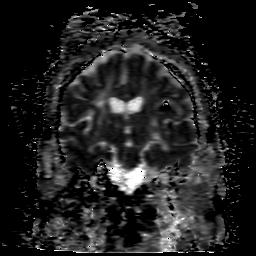
[im 35/35]
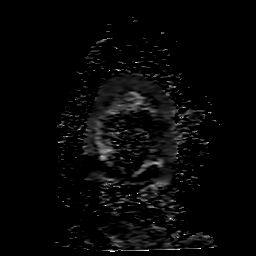

[33 of 48 positions shown; findings below may reference images not displayed]

FINDINGS: Exam is motion degraded.

Posterior left frontal opercular 2 cm hematoma. Left upper pons 9 mm
hematoma. These hemorrhage appear acute/ subacute. Scattered small
chronic blood breakdown products posterior limb right internal
capsule, right external capsule, posterior right opercular region,
anterior right frontal lobe, left temporal lobe, right occipital
lobe and cerebellum bilaterally. Intracranial hemorrhage most likely
related to hypertension given the present clinical setting and
surrounding findings. Recommend follow-up until complete clearance.
This would help exclude the much less likely consideration of
underlying lesion as cause for acute hemorrhage.

Small acute/subacute nonhemorrhagic infarcts frontal and parietal
lobes bilaterally.

Remote bilateral thalamic, bilateral centrum semiovale/ corona
radiata, bilateral basal ganglia and right pontine infarcts.

In addition to vasogenic edema surrounding the left frontal
opercular hematoma, there are marked confluent white matter changes
most consistent with result of chronic microvascular disease.

Linear dural enhancement of indeterminate etiology described in
several benign settings.

Opacification petrous apex and partial opacification left mastoid
air cells without obstructing lesion of the eustachian tube noted.
Pooling of secretions posterior pharynx. No definitive findings to
suggest petrous a bursitis. If the patient developed cranial nerve
symptoms then this possibly would need to be considered.

Major intracranial vascular structures are patent. Ectatic vertebral
arteries and basilar artery.

Mild exophthalmos.

Cervical medullary junction unremarkable.

Mild atrophy most notable involving the cerebellum. No
hydrocephalus.

Calcification of the falx. Additionally, plaque-like calcification
left convexity which may represent dural calcification although en
plaque meningioma not entirely excluded.
IMPRESSION: Exam is motion degraded.

Posterior left frontal opercular 2 cm hematoma. Left upper pons 9 mm
hematoma. These hemorrhage appear acute/ subacute. Mild vasogenic
edema surrounds left frontal lobe hematoma.

Scattered small chronic blood breakdown products posterior limb
right internal capsule, right external capsule, posterior right
opercular region, anterior right frontal lobe, left temporal lobe,
right occipital lobe and cerebellum bilaterally.

Intracranial hemorrhage most likely related to hypertension given
the present clinical setting and surrounding findings. Recommend
follow-up until complete clearance.

Small acute/subacute nonhemorrhagic infarcts frontal and parietal
lobes bilaterally.

Remote bilateral thalamic, bilateral centrum semiovale/ corona
radiata, bilateral basal ganglia and right pontine infarcts.

Marked confluent white matter changes most consistent with result of
chronic microvascular disease.

Linear dural enhancement of indeterminate etiology described in
several benign settings.

Opacification petrous apex and partial opacification left mastoid
air cells without obstructing lesion of the eustachian tube noted.
No definitive findings to suggest petrous a bursitis. If the patient
developed cranial nerve symptoms then this possibly would need to be
considered.

Please see above.

## 2018-05-29 IMAGING — CR DG CHEST 1V PORT
1 series · 1 of 1 positions shown · non-contrast
Comparison: 07/18/2015

CLINICAL DATA: Acute respiratory failure

EXAM:
PORTABLE CHEST 1 VIEW

[AP]
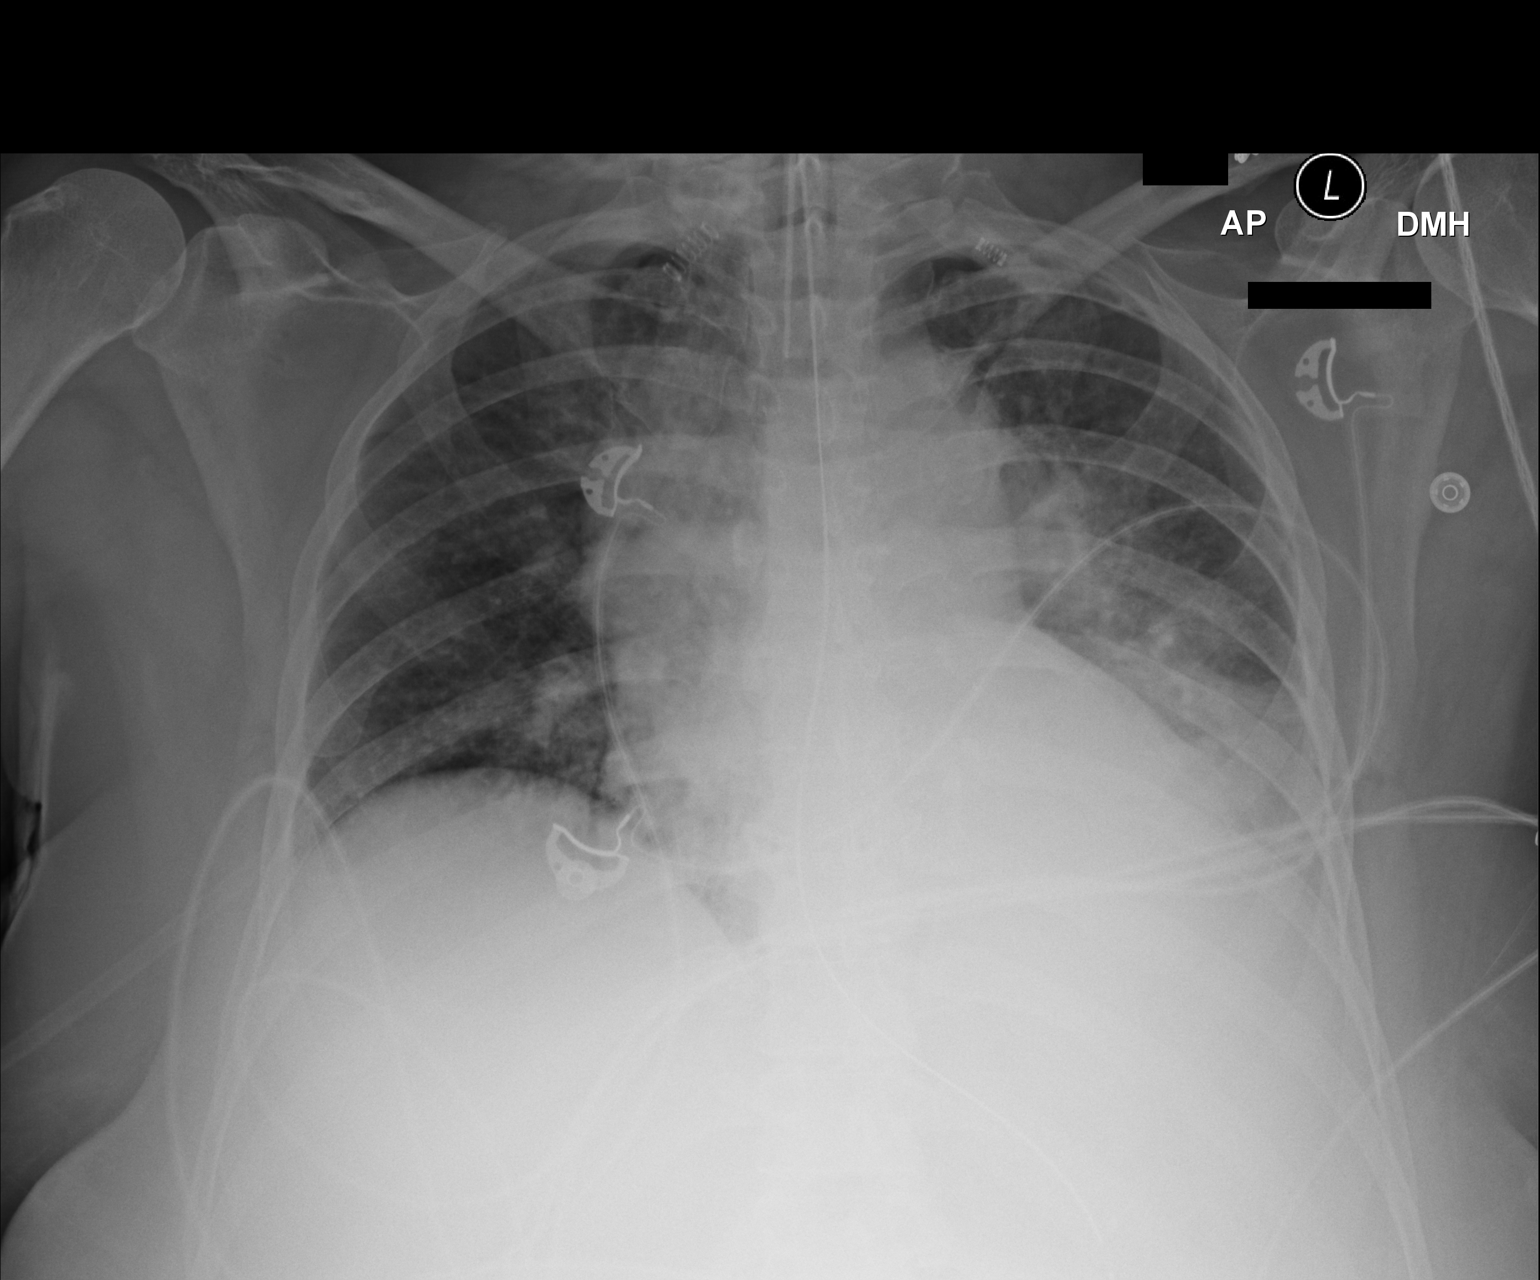

[1 of 1 positions shown; findings below may reference images not displayed]

FINDINGS: Tubular devices are stable. Left lower lobe consolidation is stable.
Right lung is under aerated and clear. No pneumothorax.
IMPRESSION: Stable left lower lobe pneumonia.

## 2021-05-21 ENCOUNTER — Emergency Department (HOSPITAL_COMMUNITY): Payer: Medicaid Other

## 2021-05-21 ENCOUNTER — Inpatient Hospital Stay (HOSPITAL_COMMUNITY)
Admission: EM | Admit: 2021-05-21 | Discharge: 2021-05-27 | DRG: 065 | Disposition: A | Discharge status: Rehabilitation Facility | Payer: Medicaid Other | Attending: Internal Medicine | Admitting: Internal Medicine

## 2021-05-21 ENCOUNTER — Other Ambulatory Visit: Payer: Self-pay

## 2021-05-21 ENCOUNTER — Encounter (HOSPITAL_COMMUNITY): Payer: Self-pay | Admitting: Pharmacy Technician

## 2021-05-21 ENCOUNTER — Inpatient Hospital Stay (HOSPITAL_COMMUNITY): Payer: Medicaid Other

## 2021-05-21 DIAGNOSIS — Z8249 Family history of ischemic heart disease and other diseases of the circulatory system: Secondary | ICD-10-CM

## 2021-05-21 DIAGNOSIS — I6930 Unspecified sequelae of cerebral infarction: Secondary | ICD-10-CM | POA: Diagnosis not present

## 2021-05-21 DIAGNOSIS — I700 Atherosclerosis of aorta: Secondary | ICD-10-CM | POA: Diagnosis present

## 2021-05-21 DIAGNOSIS — I69314 Frontal lobe and executive function deficit following cerebral infarction: Secondary | ICD-10-CM | POA: Diagnosis present

## 2021-05-21 DIAGNOSIS — Z7982 Long term (current) use of aspirin: Secondary | ICD-10-CM | POA: Diagnosis not present

## 2021-05-21 DIAGNOSIS — N1831 Chronic kidney disease, stage 3a: Secondary | ICD-10-CM | POA: Diagnosis present

## 2021-05-21 DIAGNOSIS — I69322 Dysarthria following cerebral infarction: Secondary | ICD-10-CM | POA: Diagnosis not present

## 2021-05-21 DIAGNOSIS — N179 Acute kidney failure, unspecified: Secondary | ICD-10-CM | POA: Diagnosis present

## 2021-05-21 DIAGNOSIS — I69398 Other sequelae of cerebral infarction: Secondary | ICD-10-CM

## 2021-05-21 DIAGNOSIS — I63531 Cerebral infarction due to unspecified occlusion or stenosis of right posterior cerebral artery: Principal | ICD-10-CM | POA: Diagnosis present

## 2021-05-21 DIAGNOSIS — M25561 Pain in right knee: Secondary | ICD-10-CM | POA: Diagnosis present

## 2021-05-21 DIAGNOSIS — Z833 Family history of diabetes mellitus: Secondary | ICD-10-CM | POA: Diagnosis not present

## 2021-05-21 DIAGNOSIS — I69251 Hemiplegia and hemiparesis following other nontraumatic intracranial hemorrhage affecting right dominant side: Secondary | ICD-10-CM | POA: Diagnosis not present

## 2021-05-21 DIAGNOSIS — R059 Cough, unspecified: Secondary | ICD-10-CM | POA: Diagnosis present

## 2021-05-21 DIAGNOSIS — M25569 Pain in unspecified knee: Secondary | ICD-10-CM | POA: Diagnosis not present

## 2021-05-21 DIAGNOSIS — R29708 NIHSS score 8: Secondary | ICD-10-CM | POA: Diagnosis present

## 2021-05-21 DIAGNOSIS — G8194 Hemiplegia, unspecified affecting left nondominant side: Secondary | ICD-10-CM | POA: Diagnosis present

## 2021-05-21 DIAGNOSIS — N183 Chronic kidney disease, stage 3 unspecified: Secondary | ICD-10-CM | POA: Diagnosis present

## 2021-05-21 DIAGNOSIS — B9620 Unspecified Escherichia coli [E. coli] as the cause of diseases classified elsewhere: Secondary | ICD-10-CM | POA: Diagnosis present

## 2021-05-21 DIAGNOSIS — G4730 Sleep apnea, unspecified: Secondary | ICD-10-CM | POA: Diagnosis present

## 2021-05-21 DIAGNOSIS — I6389 Other cerebral infarction: Secondary | ICD-10-CM | POA: Diagnosis not present

## 2021-05-21 DIAGNOSIS — R058 Other specified cough: Secondary | ICD-10-CM | POA: Diagnosis not present

## 2021-05-21 DIAGNOSIS — F4323 Adjustment disorder with mixed anxiety and depressed mood: Secondary | ICD-10-CM | POA: Diagnosis not present

## 2021-05-21 DIAGNOSIS — R269 Unspecified abnormalities of gait and mobility: Secondary | ICD-10-CM

## 2021-05-21 DIAGNOSIS — R053 Chronic cough: Secondary | ICD-10-CM | POA: Diagnosis present

## 2021-05-21 DIAGNOSIS — Z6833 Body mass index (BMI) 33.0-33.9, adult: Secondary | ICD-10-CM | POA: Diagnosis not present

## 2021-05-21 DIAGNOSIS — I340 Nonrheumatic mitral (valve) insufficiency: Secondary | ICD-10-CM | POA: Diagnosis present

## 2021-05-21 DIAGNOSIS — I63313 Cerebral infarction due to thrombosis of bilateral middle cerebral arteries: Secondary | ICD-10-CM | POA: Diagnosis not present

## 2021-05-21 DIAGNOSIS — M25512 Pain in left shoulder: Secondary | ICD-10-CM | POA: Diagnosis not present

## 2021-05-21 DIAGNOSIS — K592 Neurogenic bowel, not elsewhere classified: Secondary | ICD-10-CM | POA: Diagnosis not present

## 2021-05-21 DIAGNOSIS — I639 Cerebral infarction, unspecified: Secondary | ICD-10-CM | POA: Diagnosis not present

## 2021-05-21 DIAGNOSIS — Z79899 Other long term (current) drug therapy: Secondary | ICD-10-CM

## 2021-05-21 DIAGNOSIS — I69354 Hemiplegia and hemiparesis following cerebral infarction affecting left non-dominant side: Secondary | ICD-10-CM | POA: Diagnosis not present

## 2021-05-21 DIAGNOSIS — E876 Hypokalemia: Secondary | ICD-10-CM | POA: Diagnosis present

## 2021-05-21 DIAGNOSIS — I129 Hypertensive chronic kidney disease with stage 1 through stage 4 chronic kidney disease, or unspecified chronic kidney disease: Secondary | ICD-10-CM | POA: Diagnosis present

## 2021-05-21 DIAGNOSIS — R29810 Facial weakness: Secondary | ICD-10-CM | POA: Diagnosis present

## 2021-05-21 DIAGNOSIS — M25519 Pain in unspecified shoulder: Secondary | ICD-10-CM | POA: Diagnosis not present

## 2021-05-21 DIAGNOSIS — I69221 Dysphasia following other nontraumatic intracranial hemorrhage: Secondary | ICD-10-CM

## 2021-05-21 DIAGNOSIS — E669 Obesity, unspecified: Secondary | ICD-10-CM | POA: Diagnosis present

## 2021-05-21 DIAGNOSIS — K5900 Constipation, unspecified: Secondary | ICD-10-CM | POA: Diagnosis present

## 2021-05-21 DIAGNOSIS — I63431 Cerebral infarction due to embolism of right posterior cerebral artery: Secondary | ICD-10-CM

## 2021-05-21 DIAGNOSIS — R490 Dysphonia: Secondary | ICD-10-CM | POA: Diagnosis not present

## 2021-05-21 DIAGNOSIS — R001 Bradycardia, unspecified: Secondary | ICD-10-CM | POA: Diagnosis not present

## 2021-05-21 DIAGNOSIS — N390 Urinary tract infection, site not specified: Secondary | ICD-10-CM | POA: Diagnosis present

## 2021-05-21 DIAGNOSIS — M1711 Unilateral primary osteoarthritis, right knee: Secondary | ICD-10-CM | POA: Diagnosis present

## 2021-05-21 DIAGNOSIS — Z6837 Body mass index (BMI) 37.0-37.9, adult: Secondary | ICD-10-CM | POA: Diagnosis not present

## 2021-05-21 DIAGNOSIS — M25511 Pain in right shoulder: Secondary | ICD-10-CM | POA: Diagnosis not present

## 2021-05-21 DIAGNOSIS — R531 Weakness: Secondary | ICD-10-CM | POA: Diagnosis present

## 2021-05-21 DIAGNOSIS — I6931 Attention and concentration deficit following cerebral infarction: Secondary | ICD-10-CM | POA: Diagnosis not present

## 2021-05-21 DIAGNOSIS — N289 Disorder of kidney and ureter, unspecified: Secondary | ICD-10-CM | POA: Diagnosis not present

## 2021-05-21 DIAGNOSIS — I161 Hypertensive emergency: Secondary | ICD-10-CM | POA: Diagnosis present

## 2021-05-21 DIAGNOSIS — I3481 Nonrheumatic mitral (valve) annulus calcification: Secondary | ICD-10-CM | POA: Diagnosis not present

## 2021-05-21 DIAGNOSIS — I10 Essential (primary) hypertension: Secondary | ICD-10-CM | POA: Diagnosis not present

## 2021-05-21 DIAGNOSIS — I69311 Memory deficit following cerebral infarction: Secondary | ICD-10-CM | POA: Diagnosis not present

## 2021-05-21 LAB — URINALYSIS, COMPLETE (UACMP) WITH MICROSCOPIC
Bilirubin Urine: NEGATIVE
Glucose, UA: NEGATIVE mg/dL
Ketones, ur: NEGATIVE mg/dL
Nitrite: POSITIVE — AB
Protein, ur: 100 mg/dL — AB
Specific Gravity, Urine: 1.014 (ref 1.005–1.030)
WBC, UA: >50 WBC/hpf — ABNORMAL HIGH (ref 0–5)
pH: 6 (ref 5.0–8.0)

## 2021-05-21 LAB — I-STAT CHEM 8, ED
BUN: 11 mg/dL (ref 6–20)
Calcium, Ion: 1.15 mmol/L (ref 1.15–1.40)
Chloride: 109 mmol/L (ref 98–111)
Creatinine, Ser: 1.1 mg/dL — ABNORMAL HIGH (ref 0.44–1.00)
Glucose, Bld: 139 mg/dL — ABNORMAL HIGH (ref 70–99)
HCT: 46 % (ref 36.0–46.0)
Hemoglobin: 15.6 g/dL — ABNORMAL HIGH (ref 12.0–15.0)
Potassium: 3.4 mmol/L — ABNORMAL LOW (ref 3.5–5.1)
Sodium: 144 mmol/L (ref 135–145)
TCO2: 28 mmol/L (ref 22–32)

## 2021-05-21 LAB — COMPREHENSIVE METABOLIC PANEL
ALT: 38 U/L (ref 0–44)
AST: 31 U/L (ref 15–41)
Albumin: 3.8 g/dL (ref 3.5–5.0)
Alkaline Phosphatase: 129 U/L — ABNORMAL HIGH (ref 38–126)
Anion gap: 9 (ref 5–15)
BUN: 10 mg/dL (ref 6–20)
CO2: 24 mmol/L (ref 22–32)
Calcium: 10 mg/dL (ref 8.9–10.3)
Chloride: 109 mmol/L (ref 98–111)
Creatinine, Ser: 1.21 mg/dL — ABNORMAL HIGH (ref 0.44–1.00)
GFR, Estimated: 53 mL/min — ABNORMAL LOW (ref >60)
Glucose, Bld: 143 mg/dL — ABNORMAL HIGH (ref 70–99)
Potassium: 3.4 mmol/L — ABNORMAL LOW (ref 3.5–5.1)
Sodium: 142 mmol/L (ref 135–145)
Total Bilirubin: 0.5 mg/dL (ref 0.3–1.2)
Total Protein: 7.8 g/dL (ref 6.5–8.1)

## 2021-05-21 LAB — DIFFERENTIAL
Abs Immature Granulocytes: 0.03 10*3/uL (ref 0.00–0.07)
Basophils Absolute: 0 10*3/uL (ref 0.0–0.1)
Basophils Relative: 0 %
Eosinophils Absolute: 0.3 10*3/uL (ref 0.0–0.5)
Eosinophils Relative: 3 %
Immature Granulocytes: 0 %
Lymphocytes Relative: 16 %
Lymphs Abs: 1.5 10*3/uL (ref 0.7–4.0)
Monocytes Absolute: 0.6 10*3/uL (ref 0.1–1.0)
Monocytes Relative: 6 %
Neutro Abs: 6.8 10*3/uL (ref 1.7–7.7)
Neutrophils Relative %: 75 %

## 2021-05-21 LAB — ECHOCARDIOGRAM COMPLETE BUBBLE STUDY
Area-P 1/2: 2.17 cm2
MV VTI: 1.12 cm2
S' Lateral: 2.9 cm

## 2021-05-21 LAB — CBC
HCT: 50 % — ABNORMAL HIGH (ref 36.0–46.0)
Hemoglobin: 15.7 g/dL — ABNORMAL HIGH (ref 12.0–15.0)
MCH: 26.5 pg (ref 26.0–34.0)
MCHC: 31.4 g/dL (ref 30.0–36.0)
MCV: 84.3 fL (ref 80.0–100.0)
Platelets: 290 10*3/uL (ref 150–400)
RBC: 5.93 MIL/uL — ABNORMAL HIGH (ref 3.87–5.11)
RDW: 15.6 % — ABNORMAL HIGH (ref 11.5–15.5)
WBC: 9.2 10*3/uL (ref 4.0–10.5)
nRBC: 0 % (ref 0.0–0.2)

## 2021-05-21 LAB — RAPID URINE DRUG SCREEN, HOSP PERFORMED
Amphetamines: NOT DETECTED
Barbiturates: NOT DETECTED
Benzodiazepines: NOT DETECTED
Cocaine: NOT DETECTED
Opiates: NOT DETECTED
Tetrahydrocannabinol: NOT DETECTED

## 2021-05-21 LAB — I-STAT BETA HCG BLOOD, ED (MC, WL, AP ONLY): I-stat hCG, quantitative: <5 m[IU]/mL (ref <5)

## 2021-05-21 LAB — PROTIME-INR
INR: 1 (ref 0.8–1.2)
Prothrombin Time: 13 seconds (ref 11.4–15.2)

## 2021-05-21 LAB — CBG MONITORING, ED: Glucose-Capillary: 142 mg/dL — ABNORMAL HIGH (ref 70–99)

## 2021-05-21 LAB — APTT: aPTT: 31 seconds (ref 24–36)

## 2021-05-21 MED ORDER — PERFLUTREN LIPID MICROSPHERE
1.0000 mL | INTRAVENOUS | Status: AC | PRN
  Administered 2021-05-21: 2 mL via INTRAVENOUS
  Filled 2021-05-21: qty 10

## 2021-05-21 MED ORDER — ACETAMINOPHEN 325 MG PO TABS
650.0000 mg | ORAL_TABLET | ORAL | Status: DC | PRN
  Administered 2021-05-23 – 2021-05-26 (×5): 650 mg via ORAL
  Filled 2021-05-21 (×5): qty 2

## 2021-05-21 MED ORDER — ACETAMINOPHEN 650 MG RE SUPP: 650.0000 mg | RECTAL | Status: DC | PRN

## 2021-05-21 MED ORDER — GUAIFENESIN ER 600 MG PO TB12
600.0000 mg | ORAL_TABLET | Freq: Two times a day (BID) | ORAL | Status: DC
  Administered 2021-05-21 – 2021-05-22 (×2): 600 mg via ORAL
  Filled 2021-05-21 (×2): qty 1

## 2021-05-21 MED ORDER — ASPIRIN EC 81 MG PO TBEC
81.0000 mg | DELAYED_RELEASE_TABLET | Freq: Every day | ORAL | Status: DC
  Administered 2021-05-21 – 2021-05-27 (×7): 81 mg via ORAL
  Filled 2021-05-21 (×7): qty 1

## 2021-05-21 MED ORDER — SODIUM CHLORIDE 0.9 % IV SOLN: INTRAVENOUS | Status: DC

## 2021-05-21 MED ORDER — IOHEXOL 350 MG/ML SOLN
100.0000 mL | Freq: Once | INTRAVENOUS | Status: AC | PRN
  Administered 2021-05-21: 75 mL via INTRAVENOUS

## 2021-05-21 MED ORDER — POTASSIUM CHLORIDE CRYS ER 20 MEQ PO TBCR
20.0000 meq | EXTENDED_RELEASE_TABLET | ORAL | Status: DC
  Filled 2021-05-21: qty 1

## 2021-05-21 MED ORDER — SODIUM CHLORIDE 0.9% FLUSH: 3.0000 mL | Freq: Once | INTRAVENOUS | Status: DC

## 2021-05-21 MED ORDER — HYDRALAZINE HCL 20 MG/ML IJ SOLN
10.0000 mg | INTRAMUSCULAR | Status: DC | PRN
  Administered 2021-05-22 – 2021-05-26 (×11): 10 mg via INTRAVENOUS
  Filled 2021-05-21 (×11): qty 1

## 2021-05-21 MED ORDER — LABETALOL HCL 5 MG/ML IV SOLN
5.0000 mg | Freq: Once | INTRAVENOUS | Status: AC
  Administered 2021-05-21: 5 mg via INTRAVENOUS
  Filled 2021-05-21: qty 4

## 2021-05-21 MED ORDER — POTASSIUM CHLORIDE 20 MEQ PO PACK
20.0000 meq | PACK | Freq: Once | ORAL | Status: AC
  Administered 2021-05-21: 20 meq via ORAL
  Filled 2021-05-21: qty 1

## 2021-05-21 MED ORDER — ACETAMINOPHEN 160 MG/5ML PO SOLN
650.0000 mg | ORAL | Status: DC | PRN
Start: 2021-05-21 — End: 2021-05-27

## 2021-05-21 MED ORDER — STROKE: EARLY STAGES OF RECOVERY BOOK
Freq: Once | Status: AC
  Filled 2021-05-21: qty 1

## 2021-05-21 MED ORDER — SENNOSIDES-DOCUSATE SODIUM 8.6-50 MG PO TABS: 1.0000 | ORAL_TABLET | Freq: Every evening | ORAL | Status: DC | PRN

## 2021-05-21 MED ORDER — ENOXAPARIN SODIUM 40 MG/0.4ML IJ SOSY
40.0000 mg | PREFILLED_SYRINGE | INTRAMUSCULAR | Status: DC
  Administered 2021-05-21 – 2021-05-27 (×7): 40 mg via SUBCUTANEOUS
  Filled 2021-05-21 (×7): qty 0.4

## 2021-05-21 NOTE — Assessment & Plan Note (Addendum)
CKD stage IIIa.  ? Creatinine 1.21 with BUN 10.  Baseline creatinine previously had been around 0.9 back in 2019 per records on Care Everywhere.  This may constitute an acute kidney injury. ?-Urinalysis concerning for UTI with large leukocytes, positive nitrite, many bacteria, WBC > 50.  Protein of 100. ?-Renal function trended down with gentle hydration and stabilized at 1.00 on day of discharge.  ?-IV fluids discontinued.  ?-Outpatient follow-up with PCP. ?

## 2021-05-21 NOTE — Consult Note (Signed)
NEUROLOGY CONSULTATION NOTE  ? ?Date of service: May 21, 2021 ?Patient Name: Gwendolyn Morales ?MRN:  [id number] ?DOB:  November 09, 1965 ?Reason for consult: "L sided weakness" ?Requesting Provider: Teressa Lower, MD ?_ _ _   _ __   _ __ _ _  __ __   _ __   __ _ ? ?History of Present Illness  ?Rhyanne Krings is a 56 y.o. 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 She went to bed at 2130 on 05/20/21 and woke up in the morning with L sided weakness. EMS brought her in as a code stroke. Her BP was elevated to 200s over 130s. ? ?LKW: 2130 on 05/20/21 ?mRS: 0 ?tNKASE: not offered, due to history of prior ICH, subacute R PCA stroke on CTH. ?Thrombectomy: no LVO ?NIHSS components Score: Comment  ?1a Level of Conscious 0[x]  1[]  2[]  3[]      ?1b LOC Questions 0[x]  1[]  2[]       ?1c LOC Commands 0[x]  1[]  2[]       ?2 Best Gaze 0[x]  1[]  2[]       ?3 Visual 0[x]  1[]  2[]  3[]      ?4 Facial Palsy 0[]  1[x]  2[]  3[]      ?5a Motor Arm - left 0[]  1[]  2[x]  3[]  4[]  UN[]    ?5b Motor Arm - Right 0[x]  1[]  2[]  3[]  4[]  UN[]    ?6a Motor Leg - Left 0[]  1[]  2[]  3[x]  4[]  UN[]    ?6b Motor Leg - Right 0[]  1[x]  2[]  3[]  4[]  UN[]    ?7 Limb Ataxia 0[x]  1[]  2[]  3[]  UN[]     ?8 Sensory 0[x]  1[]  2[]  UN[]      ?9 Best Language 0[x]  1[]  2[]  3[]      ?10 Dysarthria 0[]  1[x]  2[]  UN[]      ?11 Extinct. and Inattention 0[x]  1[]  2[]       ?TOTAL: 8   ? ?  ?ROS  ? ?Constitutional Denies weight loss, fever and chills.   ?HEENT Denies changes in vision and hearing.   ?Respiratory Denies SOB and cough.   ?CV Denies palpitations and CP   ?GI Denies abdominal pain, nausea, vomiting and diarrhea.   ?GU Denies dysuria and urinary frequency.   ?MSK Denies myalgia and joint pain.   ?Skin Denies rash and pruritus.   ?Neurological Denies headache and syncope.   ?Psychiatric Denies recent changes in mood. Denies anxiety and depression.   ? ?Past History  ? ?Past Medical  History:  ?Diagnosis Date  ? Hypertension   ? Stroke [facility]) 07/2015  ? ?Past Surgical History:  ?Procedure Laterality Date  ? CESAREAN SECTION    ? x 2  ? ?Family History  ?Problem Relation Age of Onset  ? Diabetes Mother   ? Hypertension Sister   ? ?Social History  ? ?Socioeconomic History  ? Marital status: Married  ?  Spouse name: Herbie [place]  ? Number of children: 2  ? Years of education: Not on file  ? Highest education level: Not on file  ?Occupational History  ? Not on file  ?Tobacco Use  ? Smoking status: Never  ? Smokeless tobacco: Never  ?Substance and Sexual Activity  ? Alcohol use: No  ? Drug use: No  ? Sexual activity: Not on file  ?Other Topics Concern  ? Not on file  ?Social History Narrative  ? Lives with husband, child  ? ?Social Determinants of Health  ? ?Financial Resource Strain:  Not on file  ?Food Insecurity: Not on file  ?Transportation Needs: Not on file  ?Physical Activity: Not on file  ?Stress: Not on file  ?Social Connections: Not on file  ? ?No Known Allergies ? ?Medications  ?(Not in a hospital admission) ?  ? ?Vitals  ? ?Vitals:  ? 05/21/21 0800  ?Weight: 111.4 kg  ?  ? ?Body mass index is 37.34 kg/m?. ? ?Physical Exam  ? ?General: Laying comfortably in bed; in no acute distress.  ?HENT: Normal oropharynx and mucosa. Normal external appearance of ears and nose.  ?Neck: Supple, no pain or tenderness  ?CV: No JVD. No peripheral edema.  ?Pulmonary: Symmetric Chest rise. Normal respiratory effort.  ?Abdomen: Soft to touch, non-tender.  ?Ext: No cyanosis, edema, or deformity  ?Skin: No rash. Normal palpation of skin.   ?Musculoskeletal: Normal digits and nails by inspection. No clubbing.  ? ?Neurologic Examination  ?Mental status/Cognition: Alert, oriented to self, place, month and year, good attention.  ?Speech/language: dysarthric speech, fluent, comprehension intact, object naming intact, repetition intact. ?Cranial nerves:  ? CN II Pupils equal and reactive to light, no VF deficits   ? CN  III,IV,VI EOM intact, no gaze preference or deviation, no nystagmus   ? CN V normal sensation in V1, V2, and V3 segments bilaterally   ? CN VII Mild L facial droop  ? CN VIII normal hearing to speech  ? CN IX & X normal palatal elevation, no uvular deviation  ? CN XI 5/5 head turn and 5/5 shoulder shrug bilaterally  ? CN XII midline tongue protrusion  ? ?Motor:  ?Muscle bulk: normal, tone increased in LLE ?Mvmt Root Nerve  Muscle Right Left Comments  ?SA C5/6 Ax Deltoid 5 3   ?EF C5/6 Mc Biceps 5 4   ?EE C6/7/8 Rad Triceps 5 4   ?WF C6/7 Med FCR     ?WE C7/8 PIN ECU     ?F Ab C8/T1 U ADM/FDI 5 4   ?HF L1/2/3 Fem Illopsoas 4 2   ?KE L2/3/4 Fem Quad 4 3   ?DF L4/5 D Peron Tib Ant 5 1   ?PF S1/2 Tibial Grc/Sol 5 1   ? ?Sensation: ? Light touch Intact throughout  ? Pin prick   ? Temperature   ? Vibration   ?Proprioception   ? ?Coordination/Complex Motor:  ?- Finger to Nose intact on the Right, unable to do with LUE ?- Heel to shin unable to do 2/2 weakness. ?- Rapid alternating movement intact in RUE ?- Gait: Deferred for patient safety. ? ?Labs  ? ?CBC: No results for input(s): WBC, NEUTROABS, HGB, HCT, MCV, PLT in the last 168 hours. ? ?Basic Metabolic Panel:  ?Lab Results  ?Component Value Date  ? NA 138 08/16/2015  ? K 3.4 (L) 08/16/2015  ? CO2 27 08/16/2015  ? GLUCOSE 96 08/16/2015  ? BUN 14 08/16/2015  ? CREATININE 1.23 (H) 08/16/2015  ? CALCIUM 9.5 08/16/2015  ? GFRNONAA 51 (L) 08/16/2015  ? GFRAA 59 (L) 08/16/2015  ? ?Lipid Panel:  ?Lab Results  ?Component Value Date  ? LDLCALC 107 (H) 07/13/2015  ? ?HgbA1c:  ?Lab Results  ?Component Value Date  ? HGBA1C 5.5 07/13/2015  ? ?Urine Drug Screen:  ?   ?Component Value Date/Time  ? LABOPIA NONE DETECTED 07/16/2015 1414  ? COCAINSCRNUR NONE DETECTED 07/16/2015 1414  ? LABBENZ NONE DETECTED 07/16/2015 1414  ? AMPHETMU NONE DETECTED 07/16/2015 1414  ? THCU NONE DETECTED 07/16/2015 1414  ? LABBARB  NONE DETECTED 07/16/2015 1414  ?  ?Alcohol Level  ?   ?Component Value  Date/Time  ? ETH <5 07/12/2015 2317  ? ? ?CT Head without contrast(Personally reviewed): ?Subacute R PCA stroke, chronic R frontal stroke. No other acute intracranial abnormalities. ? ?CT angio Head and Neck with contrast(Personally reviewed): ?Multifocal multivessel stenosis but no LVO ? ?MRI Brain: ?pending ? ? ?Impression  ? ?Kruthi Bondoc is a 56 y.o. 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 Her neurologic examination is notable for LUE and LLE weakness without any sensory deficits. CTH with subacute R PCA stroke, chronic R frontal stroke. She was not a candidate for tNKASE 2/2 prior L Parietal ICH and subacute R PCA stroke. She is not a candidate for thrombectomy 2/2 no LVO on my review of the CT angio. ? ?Primary Diagnosis:  ?Other cerebral infarction due to occlusion of stenosis of small artery. ? ?Secondary Diagnosis: ?Hypertension Emergency (SBP > 180 or DBP > 120 & end organ damage) and Obesity ? ?Recommendations  ? ?- Frequent Neuro checks per stroke unit protocol ?- Recommend brain imaging with MRI Brain without contrast ?- Recommend obtaining TTE with bubble study ?- Recommend obtaining Lipid panel with LDL ?- Please start statin if LDL > 70 ?- Recommend HbA1c ?- Antithrombotic - Aspirin 81mg  daily. ?- Recommend DVT ppx ?- SBP goal - permissive hypertension first 24 h < 220/110. Held home meds.  ?- Recommend Telemetry monitoring for arrythmia ?- Recommend bedside swallow screen prior to PO intake. ?- Stroke education booklet ?- Recommend PT/OT/SLP consult ?- Recommend Urine Tox screen. ? ?Plan discussed with Dr. Matilde Sprang. ?________________________________________________________________ ? ?This patient is critically ill and at significant risk of neurological worsening, death and care requires constant monitoring of vital signs, hemodynamics,respiratory and cardiac monitoring,  neurological assessment, discussion with family, other specialists and medical decision making of high complexity. I spent 40 minutes of neurocritical care time  in the care of  this patient. This was time spent independent o

## 2021-05-21 NOTE — Assessment & Plan Note (Addendum)
Acute. ? - Repleted.  ?-Home regimen oral potassium supplementation were resumed during the hospitalization.  ?-Outpatient follow-up. ?

## 2021-05-21 NOTE — Assessment & Plan Note (Addendum)
Acute on chronic.  Patient has reportedly had this intermittent cough for which she takes Mucinex at home.  Lung sounds on exam appear to be clear. ?-Incentive spirometry ?-Patient was placed on Mucinex ?-Chest x-ray which noted minimal bibasilar atelectasis  ?-Incentive spirometry, flutter valve. ?-Status post 5-day course of antibiotics. ?

## 2021-05-21 NOTE — H&P (Addendum)
Patient History and Physical    [name] UJW:[id number] DOB: [birth year] DOA: 05/21/2021 DOS: the patient was seen and examined on 05/21/2021 PCP: Gordan Payment., MD  Patient coming from: Home (lives with husband) via EMS  Chief Complaint:  Chief Complaint  Patient presents with   Code Stroke   HPI: Gwendolyn Morales is a 56 y.o. 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  Patient was last noted to be normal last night 2130.  When she woke up this morning around 5 AM she reported being unable to move left side from her face all the way down to her leg.  Patient reported having slurred speech as well.  Normally patient ambulates with the use of a walker at baseline since her prior stroke.  She has had an intermittent cough.  Denies having any fever, chest pain, palpitations, headache, change in vision, nausea, vomiting, diarrhea, abdominal pain, or dysuria symptoms.  Does not smoke or drink alcohol.  Further talks with the patient and her husband he reports that she has been off of her blood pressure medications since possibly August of last year after having difficulty scheduling an appointment with her primary care provider.  She had continued to take aspirin, but been off of all of her other blood pressure medications.  Patient was brought to the emergency department as a code stroke by EMS.  Patient was noted to be afebrile with pulse 86-124, respirations 24, blood pressure elevated up to 204/148, and O2 saturation maintained.  CT scan of the brain with signs of a subacute right PCA stroke and chronic right frontal stroke.   Patient was not a candidate for thrombolytics due to prior history of intracranial hemorrhage. Labs significant for hemoglobin 15.7, potassium 3.4, BUN 10, creatinine 1.21, and alkaline phosphatase 124. CTA of the head neck  did not note any large vessel occlusion, but noted atherosclerosis of the both carotid bifurcations without stenosis.  MRI was significant for acute small vessel deep white matter infarcts adjacent to the posterior body of the right lateral ventricle, anterior body of the right lateral ventricle, the posterior body of the left lateral ventricle, and a late to old infarct of the right posterior medial temporal lobe and occipital lobe.  Review of Systems: As mentioned in the history of present illness. All other systems reviewed and are negative. Past Medical History:  Diagnosis Date   Hypertension    Stroke [facility]) 07/2015   Past Surgical History:  Procedure Laterality Date   CESAREAN SECTION     x 2   Social History:  reports that she has never smoked. She has never used smokeless tobacco. She reports that she does not drink alcohol and does not use drugs.  No Known Allergies  Family History  Problem Relation Age of Onset   Diabetes Mother    Hypertension Sister     Prior to Admission medications   Medication Sig Start Date End Date Taking? Authorizing Provider  amLODipine (NORVASC) 10 MG tablet Take 1 tablet (10 mg total) by mouth daily. 08/28/15   Kirsteins, Victorino Sparrow, MD  aspirin EC 81 MG tablet Take 81 mg by mouth daily.    Micki Riley, MD  busPIRone (BUSPAR) 5 MG tablet Take 1.5 tablets (7.5 mg total) by mouth 2 (two) times daily. 08/28/15   Kirsteins, Victorino Sparrow, MD  ferrous sulfate 325 (65 FE) MG  tablet Take 1 tablet (325 mg total) by mouth daily with breakfast. 08/17/15   Love, Evlyn Kanner, PA-C  guaiFENesin (MUCINEX) 600 MG 12 hr tablet Take by mouth 2 (two) times daily as needed.     [provider]  hydrALAZINE (APRESOLINE) 50 MG tablet Take 1 tablet (50 mg total) by mouth every 8 (eight) hours. 08/28/15   Kirsteins, Victorino Sparrow, MD  hydrochlorothiazide (MICROZIDE) 12.5 MG capsule Take 1 capsule (12.5 mg total) by mouth daily. 08/28/15   Kirsteins, Victorino Sparrow, MD  metoprolol  (LOPRESSOR) 100 MG tablet TAKE ONE TABLET BY MOUTH TWICE DAILY 09/10/15   Kirsteins, Victorino Sparrow, MD  Multiple Vitamin (MULTIVITAMIN WITH MINERALS) TABS tablet Take 1 tablet by mouth daily.    [provider]  potassium chloride SA (K-DUR,KLOR-CON) 20 MEQ tablet Take 1 tablet (20 mEq total) by mouth daily. 08/28/15   Erick Colace, MD    Physical Exam: Vitals:   05/21/21 0830 05/21/21 0835 05/21/21 0845 05/21/21 0900  BP: (!) 175/117  (!) 180/120   Pulse: 99  86 97  Resp: (!) 24     Temp:  98.9 F (37.2 C)    TempSrc:  Oral    SpO2: 98%  95% 96%  Weight:       Constitutional: Obese middle-age female currently in no acute distress Eyes: PERRL, lids and conjunctivae normal ENMT: Mucous membranes are moist.  Left-sided facial droop. Neck: normal, supple Respiratory: clear to auscultation bilaterally, no wheezing, no crackles. Normal respiratory effort. No accessory muscle use.  Cardiovascular: Regular rate and rhythm, no murmurs / rubs / gallops. No extremity edema.   Abdomen: no tenderness, no masses palpated. No hepatosplenomegaly. Bowel sounds positive.  Musculoskeletal: no clubbing / cyanosis.  Left arm is flexed with difficulty extending. Skin: no rashes, lesions, ulcers. No induration Neurologic: CN 2-12 grossly intact.  Strength 2-3/5 on the left upper and lower extremity.  Strength appears to be 5/5 on the right upper and lower extremity.  Speech is slurred. Psychiatric: Normal judgment and insight. Alert and oriented x 3. Normal mood.   Data Reviewed:   EKG revealed normal sinus rhythm at 77 bpm with signs of LVH.  Otherwise reviewed labs, imaging, and pertinent records as noted above in HPI.  Assessment and Plan: * CVA (cerebral vascular accident) [facility]) Patient present with complaints of left-sided weakness upon awakening this morning.  CT with signs of a subacute right PCA stroke with chronic right frontal stroke.  Patient was not a thrombolytic candidate due to  prior history of intracranial hemorrhage.  CTA of the head and neck did not note any large vessel occlusion. - Admit to telemetry bed - Stroke order set initiated -Neuro checks -Allowing for permissive hypertension 220/110 -Check Hemoglobin A1c and lipid panel -Check UDS -Check echocardiogram -limited study due to patient body habitus.  EF noted to be 60 to 65% with indeterminate diastolic parameters.  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  Message sent to Salley Hews in order to help arrange TEE. -PT/OT/Speech to eval and treat -ASA 81 mg -Social work consult  -Appreciate neurology consultative services, will follow-up any further recommendations  Hypertensive emergency On admission blood pressure was elevated up to 204/148.  Patient had not been taking any of her home blood pressure medications since possibly August 2022. Previous blood pressure regimen included amlodipine 10 mg  daily, hydralazine 50 mg 3 times daily, hydralazine 12.5 mg daily, and metoprolol 100 mg twice daily. -Allow for permissive hypertension given acute stroke -Hydralazine IV as needed for systolic blood pressures greater than 220 or diastolic blood pressure greater than 110  Hypokalemia Acute.  Initial potassium 3.4. -Give potassium chloride 20 mEq  CKD (chronic kidney disease) stage 3, GFR 30-59 ml/min (HCC)  Creatinine 1.21 with BUN 10.  Baseline creatinine previously had been around 0.9 back in 2019 per records on Care Everywhere.  This may constitute an acute kidney injury. -Check urinalysis -Continuous IV fluids at 75 mL/h -Recheck kidney function in a.m.  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  Obesity (BMI 30-39.9) BMI initially calculated at 37.3 kg/m. -Recheck height and weight  Productive cough Acute on chronic.  Patient has reportedly had this intermittent cough for which she takes Mucinex at home.  Lung sounds on exam appear to be clear. -Incentive spirometry -Mucinex -Check chest x-ray which noted minimal bibasilar atelectasis      Advance Care Planning:   Code Status: Full Code   Consults: Neurology  Family Communication: Husband updated at bedside  Severity of Illness: The appropriate patient status for this patient is INPATIENT. Inpatient status is judged to be reasonable and necessary in order to provide the required intensity of service to ensure the patient's safety. The patient's presenting symptoms, physical exam findings, and initial radiographic and laboratory data in the context of their chronic comorbidities is felt to place them at high risk for further clinical deterioration. Furthermore, it is not anticipated that the patient will be medically stable for discharge from the hospital within 2 midnights of admission.   * I certify that at the point of admission it is my clinical judgment that the patient will require inpatient hospital care spanning beyond 2 midnights from the point of admission due to high intensity of service, high risk for further deterioration and high frequency of surveillance required.*  Author: Clydie Braun, MD 05/21/2021 10:01 AM  For on call review [email or web address].

## 2021-05-21 NOTE — Assessment & Plan Note (Addendum)
Patient presented with complaints of left-sided weakness upon awakening the morning of admission.   ?-CT with signs of a subacute right PCA stroke with chronic right frontal stroke.  Patient was not a thrombolytic candidate due to prior history of intracranial hemorrhage.  ?- CTA of the head and neck did not note any large vessel occlusion. ?-Patient seen in consultation by neurology who recommended admission for stroke work-up. ?-MRI brain done with chronic small vessel disease, acute small vessel deep white matter infarctions adjacent to the posterior body of the right lateral ventricle, anterior body of the right lateral ventricle, posterior body of the left lateral ventricle, late subacute infarct of the right posterior medial temporal lobe and occipital lobe. ?- ?- echocardiogram -limited study due to patient body habitus.  EF noted to be 60 to 65% with indeterminate diastolic parameters.  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  Message sent to [name] in order to help arrange TEE which.  ?-TCD bubble study done 05/23/2021 noted to be positive per neurology and as such lower extremity Dopplers was ordered, which was negative for DVT. 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  Grade 2 atherosclerotic plaque in the thoracic and descending aorta.  No right-to-left shunt or PFO noted. ?-LDL at 118. ?-Hemoglobin A1c 5.4. ?-PT/OT/ST. ?-Permissive hypertension for initial 24 hours and slowly normalize blood pressure in the next 5 to 7 days. ?-Patient initially started on Norvasc, and Lopressor added and doses uptitrated for better blood pressure control. ?-Patient also maintained on statin. ?-Patient was seen and  followed by the stroke team who recommended aspirin and Plavix x3 weeks, and then plavix alone. ?-Per neurology concern for sleep apnea considering participation in sleep stroke prevention study which is being managed by neurology. ?-Neurology also recommended 30-day event monitor postdischarge.  Message sent to cardmaster to arrange event monitor post discharge. ?-Patient be discharged to CIR. ?-Outpatient follow-up with neurology. ? ?

## 2021-05-21 NOTE — Assessment & Plan Note (Addendum)
Patient had been getting around with use of a walker following prior stroke in 2017 which left patient with some residual right-sided weakness. ?-PT/OT assessed patient, saw patient during the hospitalization and recommended inpatient rehab services.Marland Kitchen ?

## 2021-05-21 NOTE — Assessment & Plan Note (Addendum)
-  On admission blood pressure was elevated up to 204/148.  Patient had not been taking any of her home blood pressure medications since possibly August 2022. ?- Previous blood pressure regimen included amlodipine 10 mg daily, hydralazine 50 mg 3 times daily, HCTZ 12.5 mg daily, and metoprolol 100 mg twice daily. ?-Patient noted to have been out of her antihypertensive medication since August 2022. ?-Allowed for permissive hypertension given acute stroke and slowly normalize blood pressure in the next 5 to 7 days secondary to acute CVA. ?-Patient subsequently started on Norvasc and Lopressor added to regimen and doses uptitrated for better blood pressure control. ?-Patient's home regimen HCTZ was subsequently resumed. ?-Hydralazine 25 mg p.o. 3 times daily was also started with improvement with blood pressure. ?-Hydralazine IV as needed for systolic blood pressures greater than 220 or diastolic blood pressure greater than 110. ?-We will need outpatient follow-up with PCP postdischarge from inpatient rehab. ?-Patient will need prescriptions for antihypertensive medications on discharge from inpatient rehab. ?

## 2021-05-21 NOTE — Code Documentation (Signed)
Stroke Response Nurse Documentation ?Code Documentation ? ?Gwendolyn Morales is a 56 y.o. female arriving to [facility]  via [facility] on 05/21/2021 with past medical hx of ICH. On No antithrombotic. Code stroke was activated by EMS.  ? ?Patient from home where she was LKW yesterday at 2130 and now complaining of left sided weakness and slurred speech. Patient with history of residual left sided weakness from left parietal ICH. This morning she woke up with worsening left sided weakness and slurred speech. ? ?Stroke team at the bedside on patient arrival. Labs drawn and patient cleared for CT by Dr. Matilde Sprang. Patient to CT with team. NIHSS 10, see documentation for details and code stroke times. Patient with left facial droop, left arm weakness, left leg weakness, and dysarthria  on exam.  ? ?The following imaging was completed:  CT Head and CTA.  ? ?Patient is not a candidate for IV Thrombolytic due to LKW yesterday at 2130 and history of ICH. Patient is not not a candidate for IR due to no evidence of LVO per Dr. Lorrin Goodell.  ? ?Care Plan: q2h NIHSS and VS. Permissive HTN.  BP goal: <220/110 ? ?Bedside handoff with ED RN Crystal.   ? ?Casimer Bilis  ?Rapid Response RN ? ? ?

## 2021-05-21 NOTE — Progress Notes (Signed)
2D echocardiogram with bubbles and Definity completed. ? ?05/21/2021 4:36 PM ?Eula Fried., MHA, RVT, RDCS, RDMS   ?

## 2021-05-21 NOTE — ED Notes (Signed)
ED TO INPATIENT HANDOFF REPORT ? ?ED Nurse Name and Phone #: Pattricia Bossnnie [phone] ? ?S ?Name/Age/Gender ?Gwendolyn Morales ?56 y.o. ?female ?Room/Bed: 042C/042C ? ?Code Status ?  Code Status: Full Code ? ?Home/SNF/Other ?Home ?Patient oriented to: self, place, time, and situation ?Is this baseline? Yes  ? ?Triage Complete: Triage complete  ?Chief Complaint ?CVA (cerebral vascular accident) [facility](HCC) [I63.9] ? ?Triage Note ?Pt bib ems as a code stroke. LKW 2130 yesterday. Pt with L sided weakness, with L sided facial droop and slurred speech when she woke up at 0500 today. Pt denies any falls, alert and oriented.  ?BP 206/120 ?HR 98 ?RR 18 ?98% RA  ? ?Allergies ?No Known Allergies ? ?Level of Care/Admitting Diagnosis ?ED Disposition   ? ? ED Disposition  ?Admit  ? Condition  ?--  ? Comment  ?[facility] [[id number]] ? Level of Care: Telemetry Medical [104] ? May admit patient to [facility] or [facility] if equivalent level of care is available:: No ? Covid Evaluation: Asymptomatic - no recent exposure (last 10 days) testing not required ? Diagnosis: CVA (cerebral vascular accident) [facility](HCC) [[id number]][[id number]] ? Admitting Physician: Clydie BraunSMITH, [name] [[id number]] ? Attending Physician: Clydie BraunSMITH, [name] [[id number]] ? Estimated length of stay: past midnight tomorrow ? Certification:: I certify this patient will need inpatient services for at least 2 midnights ?  ?  ? ?  ? ? ?B ?Medical/Surgery History ?Past Medical History:  ?Diagnosis Date  ? Hypertension   ? Stroke [facility](HCC) 07/2015  ? ?Past Surgical History:  ?Procedure Laterality Date  ? CESAREAN SECTION    ? x 2  ?  ? ?A ?IV Location/Drains/Wounds ?Patient Lines/Drains/Airways Status   ? ? Active Line/Drains/Airways   ? ? Name Placement date Placement time Site Days  ? Peripheral IV 05/21/21 20 G Anterior;Right Forearm 05/21/21  0907  Forearm  less than 1  ? Peripheral IV 05/21/21 20 G Anterior;Distal;Left Forearm 05/21/21  0907  Forearm  less than 1  ? ?  ?  ? ?   ? ? ?Intake/Output Last 24 hours ?No intake or output data in the 24 hours ending 05/21/21 1758 ? ?Labs/Imaging ?Results for orders placed or performed during the hospital encounter of 05/21/21 (from the past 48 hour(s))  ?CBG monitoring, ED     Status: Abnormal  ? Collection Time: 05/21/21  8:03 AM  ?Result Value Ref Range  ? Glucose-Capillary 142 (H) 70 - 99 mg/dL  ?  Comment: Glucose reference range applies only to samples taken after fasting for at least 8 hours.  ?Protime-INR     Status: None  ? Collection Time: 05/21/21  8:06 AM  ?Result Value Ref Range  ? Prothrombin Time 13.0 11.4 - 15.2 seconds  ? INR 1.0 0.8 - 1.2  ?  Comment: (NOTE) ?INR goal varies based on device and disease states. ?Performed at [facility], [address] [place], [place] [address] ?  ?APTT     Status: None  ? Collection Time: 05/21/21  8:06 AM  ?Result Value Ref Range  ? aPTT 31 24 - 36 seconds  ?  Comment: Performed at [facility], [address] [place], [place] [address]  ?CBC     Status: Abnormal  ? Collection Time: 05/21/21  8:06 AM  ?Result Value Ref Range  ? WBC 9.2 4.0 - 10.5 K/uL  ? RBC 5.93 (H) 3.87 - 5.11 MIL/uL  ? Hemoglobin 15.7 (H) 12.0 - 15.0 g/dL  ?  HCT 50.0 (H) 36.0 - 46.0 %  ? MCV 84.3 80.0 - 100.0 fL  ? MCH 26.5 26.0 - 34.0 pg  ? MCHC 31.4 30.0 - 36.0 g/dL  ? RDW 15.6 (H) 11.5 - 15.5 %  ? Platelets 290 150 - 400 K/uL  ? nRBC 0.0 0.0 - 0.2 %  ?  Comment: Performed at [facility], [address], [place], [place] [address]  ?Differential     Status: None  ? Collection Time: 05/21/21  8:06 AM  ?Result Value Ref Range  ? Neutrophils Relative % 75 %  ? Neutro Abs 6.8 1.7 - 7.7 K/uL  ? Lymphocytes Relative 16 %  ? Lymphs Abs 1.5 0.7 - 4.0 K/uL  ? Monocytes Relative 6 %  ? Monocytes Absolute 0.6 0.1 - 1.0 K/uL  ? Eosinophils Relative 3 %  ? Eosinophils Absolute 0.3 0.0 - 0.5 K/uL  ? Basophils Relative 0 %  ? Basophils Absolute 0.0 0.0 - 0.1 K/uL  ? Immature Granulocytes 0 %  ? Abs Immature  Granulocytes 0.03 0.00 - 0.07 K/uL  ?  Comment: Performed at [facility], [address], [place], [place] [address]  ?Comprehensive metabolic panel     Status: Abnormal  ? Collection Time: 05/21/21  8:06 AM  ?Result Value Ref Range  ? Sodium 142 135 - 145 mmol/L  ? Potassium 3.4 (L) 3.5 - 5.1 mmol/L  ? Chloride 109 98 - 111 mmol/L  ? CO2 24 22 - 32 mmol/L  ? Glucose, Bld 143 (H) 70 - 99 mg/dL  ?  Comment: Glucose reference range applies only to samples taken after fasting for at least 8 hours.  ? BUN 10 6 - 20 mg/dL  ? Creatinine, Ser 1.21 (H) 0.44 - 1.00 mg/dL  ? Calcium 10.0 8.9 - 10.3 mg/dL  ? Total Protein 7.8 6.5 - 8.1 g/dL  ? Albumin 3.8 3.5 - 5.0 g/dL  ? AST 31 15 - 41 U/L  ? ALT 38 0 - 44 U/L  ? Alkaline Phosphatase 129 (H) 38 - 126 U/L  ? Total Bilirubin 0.5 0.3 - 1.2 mg/dL  ? GFR, Estimated 53 (L) >60 mL/min  ?  Comment: (NOTE) ?Calculated using the CKD-EPI Creatinine Equation (2021) ?  ? Anion gap 9 5 - 15  ?  Comment: Performed at [facility], [address] [place], [place] [address]  ?I-Stat beta hCG blood, ED     Status: None  ? Collection Time: 05/21/21  8:10 AM  ?Result Value Ref Range  ? I-stat hCG, quantitative <5.0 <5 mIU/mL  ? Comment 3          ?  Comment:   GEST. AGE      CONC.  (mIU/mL) ?  <=1 WEEK        5 - 50 ?    2 WEEKS       50 - 500 ?    3 WEEKS       100 - 10,000 ?    4 WEEKS     1,000 - 30,000 ?       ?FEMALE AND NON-PREGNANT FEMALE: ?    LESS THAN 5 mIU/mL ?  ?I-stat chem 8, ED     Status: Abnormal  ? Collection Time: 05/21/21  8:11 AM  ?Result Value Ref Range  ? Sodium 144 135 - 145 mmol/L  ? Potassium 3.4 (L) 3.5 - 5.1 mmol/L  ? Chloride 109 98 - 111 mmol/L  ?  BUN 11 6 - 20 mg/dL  ? Creatinine, Ser 1.10 (H) 0.44 - 1.00 mg/dL  ? Glucose, Bld 139 (H) 70 - 99 mg/dL  ?  Comment: Glucose reference range applies only to samples taken after fasting for at least 8 hours.  ? Calcium, Ion 1.15 1.15 - 1.40 mmol/L  ? TCO2 28 22 - 32 mmol/L  ? Hemoglobin 15.6 (H) 12.0 - 15.0 g/dL   ? HCT 46.0 36.0 - 46.0 %  ? ?MR BRAIN WO CONTRAST ? ?Result Date: 05/21/2021 ?CLINICAL DATA:  Neuro deficit, acute, stroke suspected. Acute presentation with left-sided weakness. EXAM: MRI HEAD WITHOUT CONTRAST TECHNIQUE: Multiplanar, multiecho pulse sequences of the brain and surrounding structures were obtained without intravenous contrast. COMPARISON:  CT studies earlier same day.  MRI 08/06/2016. FINDINGS: Brain: Extensive old small vessel infarctions are seen affecting the pons. No focal cerebellar insult. There is an old infarction in the right PCA territory affecting the posteromedial temporal lobe and occipital lobe. This could be late subacute. Old small vessel infarctions are seen affecting the thalami I and throughout the cerebral hemispheric white matter. Foci of restricted diffusion are noted within the deep white matter adjacent to the posterior body of the right lateral ventricle, the anterior body of the right lateral ventricle in the posterior body of the left lateral ventricle. Few punctate foci of hemosiderin deposition present within the brain associated with some of the old infarctions. No mass, hydrocephalus or extra-axial collection. Vascular: Major vessels at the base of the brain show flow. Skull and upper cervical spine: Negative Sinuses/Orbits: Clear/normal Other: None IMPRESSION: Advanced chronic small-vessel ischemic changes throughout the brain. Acute small-vessel deep white matter infarctions adjacent to the posterior body of the right lateral ventricle, the anterior body of the right lateral ventricle in the posterior body of the left lateral ventricle. Old right frontal operculum stroke. Late subacute to old infarction of the right posteromedial temporal lobe and occipital lobe. Electronically Signed   By: Paulina Fusi M.D.   On: 05/21/2021 10:13  ? ?CT HEAD CODE STROKE WO CONTRAST ? ?Result Date: 05/21/2021 ?CLINICAL DATA:  Code stroke. Neuro deficit, acute, stroke suspected. EXAM:  CT HEAD WITHOUT CONTRAST TECHNIQUE: Contiguous axial images were obtained from the base of the skull through the vertex without intravenous contrast. RADIATION DOSE REDUCTION: This exam was performed according to t

## 2021-05-21 NOTE — ED Triage Notes (Signed)
Pt bib ems as a code stroke. LKW 2130 yesterday. Pt with L sided weakness, with L sided facial droop and slurred speech when she woke up at 0500 today. Pt denies any falls, alert and oriented.  ?BP 206/120 ?HR 98 ?RR 18 ?98% RA ?

## 2021-05-21 NOTE — Assessment & Plan Note (Addendum)
BMI initially calculated at 37.3 kg/m?. ?-Lifestyle modification. ?-Outpatient follow-up with PCP. ?

## 2021-05-21 NOTE — ED Provider Notes (Signed)
?[facility] EMERGENCY DEPARTMENT ?Provider Note ? ?CSN: [id number] ?Arrival date & time: 05/21/21 0759 ? ?Chief Complaint(s) ?No chief complaint on file. ? ?HPI ?Gwendolyn Morales is a 56 y.o. 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  Last known well at 2130 on 05/20/2021 and the patient awoke with left-sided weakness.  EMS found the patient with systolics greater than 200.  She arrives as a stroke alert for left-sided weakness.  Denies chest pain, shortness of breath, abdominal pain, nausea, vomiting or other systemic symptoms. ? ?HPI ? ?Past Medical History ?Past Medical History:  ?Diagnosis Date  ? Hypertension   ? Stroke [facility]) 07/2015  ? ?Patient Active Problem List  ? Diagnosis Date Noted  ? Gait disturbance, post-stroke 08/28/2015  ? Hemiparesis as late effect of cerebrovascular accident (CVA) (HCC) 08/28/2015  ? Benign essential HTN   ? Adjustment disorder with depressed mood   ? Cerebral parenchymal hemorrhage (HCC) 07/24/2015  ? Right-sided nontraumatic intracerebral hemorrhage of brainstem (HCC)   ? Elevated bilirubin   ? Adjustment disorder with mixed anxiety and depressed mood   ? Diastolic dysfunction   ? Morbid obesity due to excess calories (HCC)   ? H/O noncompliance with medical treatment, presenting hazards to health   ? Tachypnea   ? Acute blood loss anemia   ? Dysphagia, post-stroke   ? HTN (hypertension)   ? Productive cough   ? Tracheostomy status (HCC)   ? Nontraumatic cortical hemorrhage of left cerebral hemisphere [facility])   ? Hypokalemia   ? Azotemia   ? Acute respiratory failure (HCC)   ? Acute respiratory failure with hypoxemia (HCC)   ? Cytotoxic brain edema (HCC) 07/13/2015  ? Malignant hypertension 07/13/2015  ? ICH (intracerebral hemorrhage) (HCC) 07/12/2015  ? ?Home Medication(s) ?Prior to Admission medications   ?Medication Sig Start Date End Date  Taking? Authorizing Provider  ?amLODipine (NORVASC) 10 MG tablet Take 1 tablet (10 mg total) by mouth daily. 08/28/15   Kirsteins, Victorino Sparrow, MD  ?aspirin EC 81 MG tablet Take 81 mg by mouth daily.    Micki Riley, MD  ?busPIRone (BUSPAR) 5 MG tablet Take 1.5 tablets (7.5 mg total) by mouth 2 (two) times daily. 08/28/15   Kirsteins, Victorino Sparrow, MD  ?ferrous sulfate 325 (65 FE) MG tablet Take 1 tablet (325 mg total) by mouth daily with breakfast. 08/17/15   Love, Evlyn Kanner, PA-C  ?guaiFENesin (MUCINEX) 600 MG 12 hr tablet Take by mouth 2 (two) times daily as needed.     [provider]  ?hydrALAZINE (APRESOLINE) 50 MG tablet Take 1 tablet (50 mg total) by mouth every 8 (eight) hours. 08/28/15   Kirsteins, Victorino Sparrow, MD  ?hydrochlorothiazide (MICROZIDE) 12.5 MG capsule Take 1 capsule (12.5 mg total) by mouth daily. 08/28/15   Kirsteins, Victorino Sparrow, MD  ?metoprolol (LOPRESSOR) 100 MG tablet TAKE ONE TABLET BY MOUTH TWICE DAILY 09/10/15   Kirsteins, Victorino Sparrow, MD  ?Multiple Vitamin (MULTIVITAMIN WITH MINERALS) TABS tablet Take 1 tablet by mouth daily.    [provider]  ?potassium chloride SA (K-DUR,KLOR-CON) 20 MEQ tablet Take 1 tablet (20 mEq total) by mouth daily. 08/28/15   Kirsteins, Victorino Sparrow, MD  ?                                                                                                                                  ?  Past Surgical History ?Past Surgical History:  ?Procedure Laterality Date  ? CESAREAN SECTION    ? x 2  ? ?Family History ?Family History  ?Problem Relation Age of Onset  ? Diabetes Mother   ? Hypertension Sister   ? ? ?Social History ?Social History  ? ?Tobacco Use  ? Smoking status: Never  ? Smokeless tobacco: Never  ?Substance Use Topics  ? Alcohol use: No  ? Drug use: No  ? ?Allergies ?Patient has no known allergies. ? ?Review of Systems ?Review of Systems  ?Neurological:  Positive for weakness.  ? ?Physical Exam ?Vital Signs  ?I have reviewed the triage vital signs ?Wt 111.4 kg    BMI 37.34 kg/m?  ? ?Physical Exam ?Vitals and nursing note reviewed.  ?Constitutional:   ?   General: She is not in acute distress. ?   Appearance: She is well-developed.  ?HENT:  ?   Head: Normocephalic and atraumatic.  ?Eyes:  ?   Conjunctiva/sclera: Conjunctivae normal.  ?Cardiovascular:  ?   Rate and Rhythm: Normal rate and regular rhythm.  ?   Heart sounds: No murmur heard. ?Pulmonary:  ?   Effort: Pulmonary effort is normal. No respiratory distress.  ?   Breath sounds: Normal breath sounds.  ?Abdominal:  ?   Palpations: Abdomen is soft.  ?   Tenderness: There is no abdominal tenderness.  ?Musculoskeletal:     ?   General: No swelling.  ?   Cervical back: Neck supple.  ?Skin: ?   General: Skin is warm and dry.  ?   Capillary Refill: Capillary refill takes less than 2 seconds.  ?Neurological:  ?   Mental Status: She is alert.  ?   Cranial Nerves: No cranial nerve deficit.  ?   Sensory: No sensory deficit.  ?   Motor: Weakness present.  ?Psychiatric:     ?   Mood and Affect: Mood normal.  ? ? ?ED Results and Treatments ?Labs ?(all labs ordered are listed, but only abnormal results are displayed) ?Labs Reviewed  ?CBC - Abnormal; Notable for the following components:  ?    Result Value  ? RBC 5.93 (*)   ? Hemoglobin 15.7 (*)   ? HCT 50.0 (*)   ? RDW 15.6 (*)   ? All other components within normal limits  ?CBG MONITORING, ED - Abnormal; Notable for the following components:  ? Glucose-Capillary 142 (*)   ? All other components within normal limits  ?I-STAT CHEM 8, ED - Abnormal; Notable for the following components:  ? Potassium 3.4 (*)   ? Creatinine, Ser 1.10 (*)   ? Glucose, Bld 139 (*)   ? Hemoglobin 15.6 (*)   ? All other components within normal limits  ?PROTIME-INR  ?APTT  ?DIFFERENTIAL  ?COMPREHENSIVE METABOLIC PANEL  ?CBG MONITORING, ED  ?I-STAT BETA HCG BLOOD, ED ([facility], [facility], [facility] ONLY)  ?                                                                                                                        ? ?  Radiology ?No results found. ? ?Pertinent labs & imaging results that were available during my care of the patient were reviewed by me and considered in my medical decision making (see MDM for details). ? ?Medications Ordered in ED ?Medications  ?sodium chloride flush (NS) 0.9 % injection 3 mL (has no administration in time range)  ?iohexol (OMNIPAQUE) 350 MG/ML injection 100 mL (75 mLs Intravenous Contrast Given 05/21/21 0820)  ?                                                               ?                                                                    ?Procedures ?Marland KitchenCritical Care ?Performed by: Glendora Score, MD ?Authorized by: Glendora Score, MD  ? ?Critical care provider statement:  ?  Critical care time (minutes):  30 ?  Critical care was necessary to treat or prevent imminent or life-threatening deterioration of the following conditions: CVA, tPA considered. ?  Critical care was time spent personally by me on the following activities:  Development of treatment plan with patient or surrogate, discussions with consultants, evaluation of patient's response to treatment, examination of patient, ordering and review of laboratory studies, ordering and review of radiographic studies, ordering and performing treatments and interventions, pulse oximetry, re-evaluation of patient's condition and review of old charts ? ?(including critical care time) ? ?Medical Decision Making / ED Course ? ? ?This patient presents to the ED for concern of left-sided weakness, this involves an extensive number of treatment options, and is a complaint that carries with it a high risk of complications and morbidity.  The differential diagnosis includes CVA, TIA, Todd's paralysis, peripheral neuropathy ? ?MDM: ?Seen the emergency department for evaluation of left-sided weakness.  Patient arrives as a stroke alert and initial CT stroke imaging with a subacute new right occipital lobe stroke.  CTA with no LVO.  Laboratory  evaluation with mild hypokalemia at 3.4, creatinine 1.2, hemoglobin 15.7 but is otherwise unremarkable.  tPA considered, but given previous history of ICH she is not a candidate.  She is also not a candidate for tPA gi

## 2021-05-22 ENCOUNTER — Other Ambulatory Visit (HOSPITAL_COMMUNITY): Payer: Medicaid Other

## 2021-05-22 ENCOUNTER — Encounter (HOSPITAL_COMMUNITY): Payer: Self-pay | Admitting: Internal Medicine

## 2021-05-22 DIAGNOSIS — I639 Cerebral infarction, unspecified: Secondary | ICD-10-CM | POA: Diagnosis not present

## 2021-05-22 DIAGNOSIS — I161 Hypertensive emergency: Secondary | ICD-10-CM | POA: Diagnosis not present

## 2021-05-22 DIAGNOSIS — N1831 Chronic kidney disease, stage 3a: Secondary | ICD-10-CM | POA: Diagnosis not present

## 2021-05-22 DIAGNOSIS — I6930 Unspecified sequelae of cerebral infarction: Secondary | ICD-10-CM | POA: Diagnosis not present

## 2021-05-22 LAB — LIPID PANEL
Cholesterol: 184 mg/dL (ref 0–200)
HDL: 40 mg/dL — ABNORMAL LOW (ref >40)
LDL Cholesterol: 118 mg/dL — ABNORMAL HIGH (ref 0–99)
Total CHOL/HDL Ratio: 4.6 RATIO
Triglycerides: 129 mg/dL (ref <150)
VLDL: 26 mg/dL (ref 0–40)

## 2021-05-22 LAB — HIV ANTIBODY (ROUTINE TESTING W REFLEX): HIV Screen 4th Generation wRfx: NONREACTIVE

## 2021-05-22 LAB — C-REACTIVE PROTEIN: CRP: 5.1 mg/dL — ABNORMAL HIGH (ref <1.0)

## 2021-05-22 LAB — RPR: RPR Ser Ql: NONREACTIVE

## 2021-05-22 LAB — ANTITHROMBIN III: AntiThromb III Func: 104 % (ref 75–120)

## 2021-05-22 LAB — HEMOGLOBIN A1C
Hgb A1c MFr Bld: 5.4 % (ref 4.8–5.6)
Mean Plasma Glucose: 108.28 mg/dL

## 2021-05-22 LAB — SEDIMENTATION RATE: Sed Rate: 20 mm/hr (ref 0–22)

## 2021-05-22 MED ORDER — ATORVASTATIN CALCIUM 40 MG PO TABS
40.0000 mg | ORAL_TABLET | Freq: Every day | ORAL | Status: DC
  Administered 2021-05-22: 40 mg via ORAL
  Filled 2021-05-22: qty 1

## 2021-05-22 MED ORDER — GUAIFENESIN ER 600 MG PO TB12
1200.0000 mg | ORAL_TABLET | Freq: Two times a day (BID) | ORAL | Status: DC
  Administered 2021-05-22 – 2021-05-27 (×10): 1200 mg via ORAL
  Filled 2021-05-22 (×10): qty 2

## 2021-05-22 MED ORDER — AMLODIPINE BESYLATE 2.5 MG PO TABS
2.5000 mg | ORAL_TABLET | Freq: Every day | ORAL | Status: DC
  Administered 2021-05-22: 2.5 mg via ORAL
  Filled 2021-05-22: qty 1

## 2021-05-22 MED ORDER — ATORVASTATIN CALCIUM 80 MG PO TABS
80.0000 mg | ORAL_TABLET | Freq: Every day | ORAL | Status: DC
  Administered 2021-05-23 – 2021-05-27 (×5): 80 mg via ORAL
  Filled 2021-05-22 (×5): qty 1

## 2021-05-22 MED ORDER — SODIUM CHLORIDE 0.9 % IV SOLN
2.0000 g | INTRAVENOUS | Status: DC
  Administered 2021-05-22 – 2021-05-25 (×4): 2 g via INTRAVENOUS
  Filled 2021-05-22 (×4): qty 20

## 2021-05-22 MED ORDER — CLOPIDOGREL BISULFATE 75 MG PO TABS
75.0000 mg | ORAL_TABLET | Freq: Every day | ORAL | Status: DC
  Administered 2021-05-22 – 2021-05-27 (×6): 75 mg via ORAL
  Filled 2021-05-22 (×6): qty 1

## 2021-05-22 MED ORDER — AMLODIPINE BESYLATE 5 MG PO TABS
5.0000 mg | ORAL_TABLET | Freq: Every day | ORAL | Status: DC
Start: 2021-05-22 — End: 2021-05-22

## 2021-05-22 MED ORDER — ONDANSETRON HCL 4 MG/2ML IJ SOLN
INTRAMUSCULAR | Status: AC
  Filled 2021-05-22: qty 2

## 2021-05-22 MED ORDER — ONDANSETRON HCL 4 MG/2ML IJ SOLN
4.0000 mg | Freq: Four times a day (QID) | INTRAMUSCULAR | Status: DC | PRN
  Administered 2021-05-22: 4 mg via INTRAVENOUS

## 2021-05-22 NOTE — Hospital Course (Signed)
Patient 56 year old female history of hypertension, CVA with ICH in 2017 with residual right-sided weakness presented with complaints of left-sided weakness started on the morning of admission.  Patient also noted to have some dysarthric speech that had worsened.  Normally patient noted to ambulate with the use of a walker at baseline since her prior stroke.  Patient brought to the ED as a code stroke.  On presentation patient noted to have a blood pressure of 204/148 with normal O2.  CT head done with signs of subacute right PCA stroke and chronic right frontal stroke.  Patient noted to not be a candidate for thrombolytics due to prior history of intracranial hemorrhage.  CT angiogram head and neck done was negative for LVO.  MRI significant for acute small vessel deep white matter infarcts adjacent to the posterior body of the right lateral ventricle, anterior body of the right lateral ventricle, posterior body of the left lateral ventricle and related to old infarct of the right posterior medial temporal lobe and occipital lobe.  Urinalysis also concerning for UTI.  Patient admitted for stroke work-up.  Neurology consulted and following ?

## 2021-05-22 NOTE — Evaluation (Signed)
Speech Language Pathology Evaluation ?Patient Details ?Name: Gwendolyn Morales ?MRN: [id number] ?DOB: [birth year] ?Today's Date: 05/22/2021 ?Time: [id number] ?SLP Time Calculation (min) (ACUTE ONLY): 36 min ? ?Problem List:  ?Patient Active Problem List  ? Diagnosis Date Noted  ? CVA (cerebral vascular accident) ([place]) 05/21/2021  ? CKD (chronic kidney disease) stage 3, GFR 30-59 ml/min (HCC) 05/21/2021  ? History of CVA with residual deficit 05/21/2021  ? History of CVA with residual right-sided weakness gait disturbance, post-stroke 08/28/2015  ? Hemiparesis as late effect of cerebrovascular accident (CVA) ([place]) 08/28/2015  ? Benign essential HTN   ? Adjustment disorder with depressed mood   ? Cerebral parenchymal hemorrhage ([place]) 07/24/2015  ? Right-sided nontraumatic intracerebral hemorrhage of brainstem ([place])   ? Elevated bilirubin   ? Adjustment disorder with mixed anxiety and depressed mood   ? Diastolic dysfunction   ? Obesity (BMI 30-39.9)   ? H/O noncompliance with medical treatment, presenting hazards to health   ? Tachypnea   ? Acute blood loss anemia   ? Dysphagia, post-stroke   ? HTN (hypertension)   ? Productive cough   ? Tracheostomy status ([place])   ? Nontraumatic cortical hemorrhage of left cerebral hemisphere [facility])   ? Hypokalemia   ? Azotemia   ? Acute respiratory failure ([place])   ? Acute respiratory failure with hypoxemia (HCC)   ? Cytotoxic brain edema ([place]) 07/13/2015  ? Hypertensive emergency 07/13/2015  ? ICH (intracerebral hemorrhage) ([place]) 07/12/2015  ? ?Past Medical History:  ?Past Medical History:  ?Diagnosis Date  ? Hypertension   ? Stroke [facility]) 07/2015  ? ?Past Surgical History:  ?Past Surgical History:  ?Procedure Laterality Date  ? CESAREAN SECTION    ? x 2  ? ?HPI:  ?56 yo female adm to [facility] with left sided weakness- Pt has PMH + for prior CVAs in 2017 requiring trach/PEG due to dysphagia - received rehab on CIR.  She has deep white matter infarctions Old right frontal operculum stroke. Late  subacute to old infarction of the right posteromedial temporal  lobe and occipital lobe.  Per notes, pt is largely wheelchair bound.  She resides with her spouse and 43 yo son. States her son cooks for her but she manages bills, medications, appointments.  Speech evaluation ordered.  ? ?Assessment / Plan / Recommendation ?Clinical Impression ? Pt presents with moderately severe dysarthria c/b imprecise articulation and very rapid rate of hypophonic speech.  She has at least hypoglossal and facial nerve impairments with decreased labial closure on left, impaired lingual strength with lingual deviation to the right upon protrusion and inability to protrude lingual to left buccal region.  She benefits from max verbal cues to slow rate and pace speech to maximize intelligibilty to approx 75% at short phrase level.  Pt admits to frustration with "having to repeat".  She states her voice is weaker and articulation is more impaired. Pt also with headache during session. No family present to establish baseline. MOCA administered with pt scoring 26/30 - strengths included memory (recall 4/5 words Independently), narrative recall, functional math questions. Her largest deficit at this time is her speech- given she reports exacerbation of baseline CVA-sourced dysarthria- SLP recommends follow up to address dysarthria.  Of note, pt reports she manages bills, appointments, medications, etc at home - thus recommend she undergo further diagnostic/treatment of cognitive skills to assure she is able to manage these duties.  Pt agreeable to plan. Using teach back and return demonstration, slowing rate, overarticulation and pacing conducted as  well as environmental modifications to maximize listener comprehension.   RN prsent at end of session. ?   ?SLP Assessment ? SLP Recommendation/Assessment: Patient needs continued [place] [facility] ?SLP Visit Diagnosis: Dysarthria and anarthria (R47.1)  ?  ?Recommendations for  follow up therapy are one component of a multi-disciplinary discharge planning process, led by the attending physician.  Recommendations may be updated based on patient status, additional functional criteria and insurance authorization. ?   ?Follow Up Recommendations ? Home health SLP  ?  ?Assistance Recommended at Discharge ? PRN  ?Functional Status Assessment Patient has had a recent decline in their functional status and demonstrates the ability to make significant improvements in function in a reasonable and predictable amount of time.  ?Frequency and Duration min 1 x/week  ?1 week ?  ?   ?SLP Evaluation ?Cognition ? Overall Cognitive Status: Within Functional Limits for tasks assessed ?Orientation Level: Oriented X4 ?Year: 2023 ?Month: April ?Day of Week: Incorrect ?Memory: Impaired (recalled 4/5 items without cue, 1/5 with cue) ?Memory Impairment: Retrieval deficit ?Awareness: Appears intact ?Problem Solving: Impaired ?Problem Solving Impairment: Functional complex (pt needs cues to slow rate of speech and overarticulate) ?Safety/Judgment: Appears intact ?Comments: Pt states she does not want to repeat herself but needs max cues to use compensation strategies to improve intelligiblity.  ?  ?   ?Comprehension ? Auditory Comprehension ?Overall Auditory Comprehension: Appears within functional limits for tasks assessed ?Yes/No Questions: Not tested ?Commands: Within Functional Limits ?Conversation: Complex ?Visual Recognition/Discrimination ?Discrimination: Within Function Limits ?Reading Comprehension ?Reading Status: Not tested  ?  ?Expression Expression ?Primary Mode of Expression: Verbal ?Verbal Expression ?Overall Verbal Expression: Impaired ?Initiation: No impairment ?Level of Generative/Spontaneous Verbalization: Sentence (due to dysarthria) ?Repetition: No impairment ?Naming: Not tested ?Pragmatics: No impairment ?Interfering Components: Speech intelligibility ?Written Expression ?Dominant Hand:  Right ?Written Expression:  (pt drew very small clock - wrote name in cursive approx 1.5 in height)   ?Oral / Motor ? Oral Motor/Sensory Function ?Overall Oral Motor/Sensory Function: Severe impairment ?Facial ROM: Reduced left ?Facial Symmetry: Abnormal symmetry left ?Facial Strength: Reduced left ?Lingual ROM: Reduced left ?Lingual Symmetry: Abnormal symmetry left;Abnormal symmetry right;Suspected CN XII (hypoglossal) dysfunction ?Lingual Strength: Reduced;Suspected CN XII (hypoglossal) dysfunction ?[place]: Within Functional Limits ?Motor Speech ?Overall Motor Speech: Impaired ?Respiration: Impaired ?Level of Impairment: Phrase ?Resonance: Within functional limits ?Articulation: Impaired ?Level of Impairment: Word ?Intelligibility: Intelligibility reduced ?Word: 75-100% accurate ?Phrase: 50-74% accurate ?Sentence: 25-49% accurate ?Conversation: Not tested ?Motor Planning: Witnin functional limits ?Interfering Components: Premorbid status ?Effective Techniques: Pacing;Over-articulate;Increased vocal intensity   ?        ?Kathleen Lime, MS CCC SLP ?Acute Rehab Services ?Office [phone] ?Pager [phone] ? ?Macario Golds ?05/22/2021, 10:26 AM ? ?

## 2021-05-22 NOTE — Plan of Care (Signed)
?  Problem: Education: ?Goal: Knowledge of secondary prevention will improve (SELECT ALL) ?Outcome: Progressing ?Goal: Knowledge of patient specific risk factors will improve (INDIVIDUALIZE FOR PATIENT) ?Outcome: Progressing ?Goal: Individualized Educational Video(s) ?Outcome: Progressing ?  ?Problem: Coping: ?Goal: Will verbalize positive feelings about self ?Outcome: Progressing ?Goal: Will identify appropriate support needs ?Outcome: Progressing ?  ?Problem: Self-Care: ?Goal: Verbalization of feelings and concerns over difficulty with self-care will improve ?Outcome: Progressing ?Goal: Ability to communicate needs accurately will improve ?Outcome: Progressing ?  ?Problem: Nutrition: ?Goal: Risk of aspiration will decrease ?Outcome: Progressing ?  ?Problem: Education: ?Goal: Knowledge of General Education information will improve ?Description: Including pain rating scale, medication(s)/side effects and non-pharmacologic comfort measures ?Outcome: Progressing ?  ?Problem: Health Behavior/Discharge Planning: ?Goal: Ability to manage health-related needs will improve ?Outcome: Progressing ?  ?Problem: Clinical Measurements: ?Goal: Ability to maintain clinical measurements within normal limits will improve ?Outcome: Progressing ?Goal: Will remain free from infection ?Outcome: Progressing ?Goal: Diagnostic test results will improve ?Outcome: Progressing ?Goal: Respiratory complications will improve ?Outcome: Progressing ?Goal: Cardiovascular complication will be avoided ?Outcome: Progressing ?  ?Problem: Activity: ?Goal: Risk for activity intolerance will decrease ?Outcome: Progressing ?  ?Problem: Coping: ?Goal: Level of anxiety will decrease ?Outcome: Progressing ?  ?Problem: Elimination: ?Goal: Will not experience complications related to bowel motility ?Outcome: Progressing ?Goal: Will not experience complications related to urinary retention ?Outcome: Progressing ?  ?Problem: Safety: ?Goal: Ability to remain free  from injury will improve ?Outcome: Progressing ?  ?Problem: Skin Integrity: ?Goal: Risk for impaired skin integrity will decrease ?Outcome: Progressing ?  ?

## 2021-05-22 NOTE — Progress Notes (Addendum)
STROKE TEAM PROGRESS NOTE  ? ?INTERVAL HISTORY ?Her husband is at the bedside.  Has not been on her blood pressure medication since approximately August 2022 due to difficulty scheduling an appointment with PCP.  She has been taking her aspirin.  She has history of stroke in 2017 with residual deficits.  She had extensive work-up at that time with question of vasculitis but all testing was negativeDoes not stay by herself.  She either has her husband or son home with her, husband states that yesterday her left side wasn't working correctly. EMS called. Walking with a walker on Monday for short distances. Increased weakness on left side, left facial droop. Voice is soft ?TCD bubble study today and TEE scheduled for Friday at 0800 ?Ischemic Hypercoagulable & Vasculitic Workup pending ?MRI scan of the brain shows multiple small deep white matter infarcts in the periventricular region on the right greater than left.  There is an old right frontal opercular stroke and late subacute old infarction of the right posterior medial temporal and occipital lobe. ? ?Vitals:  ? 05/22/21 0024 05/22/21 0238 05/22/21 0501 05/22/21 [id number]  ?BP: (!) 217/116 (!) 186/113 (!) 183/128 (!) 196/112  ?Pulse: 94 96 97 100  ?Resp: 20 20 20 18   ?Temp: 98.6 ?F (37 ?C) 98.9 ?F (37.2 ?C) 98.5 ?F (36.9 ?C) 98.2 ?F (36.8 ?C)  ?TempSrc: Oral Oral Oral   ?SpO2: 95% 97% 95% 96%  ?Weight:      ? ?CBC:  ?Recent Labs  ?Lab 05/21/21 ?0806 05/21/21 ?[id number]  ?WBC 9.2  --   ?NEUTROABS 6.8  --   ?HGB 15.7* 15.6*  ?HCT 50.0* 46.0  ?MCV 84.3  --   ?PLT 290  --   ? ?Basic Metabolic Panel:  ?Recent Labs  ?Lab 05/21/21 ?0806 05/21/21 ?[id number]  ?NA 142 144  ?K 3.4* 3.4*  ?CL 109 109  ?CO2 24  --   ?GLUCOSE 143* 139*  ?BUN 10 11  ?CREATININE 1.21* 1.10*  ?CALCIUM 10.0  --   ? ?Lipid Panel:  ?Recent Labs  ?Lab 05/22/21 ?0413  ?CHOL 184  ?TRIG 129  ?HDL 40*  ?CHOLHDL 4.6  ?VLDL 26  ?LDLCALC 118*  ? ?HgbA1c:  ?Recent Labs  ?Lab 05/22/21 ?0413  ?HGBA1C 5.4  ? ?Urine Drug Screen:   ?Recent Labs  ?Lab 05/21/21 ?2146  ?LABOPIA NONE DETECTED  ?COCAINSCRNUR NONE DETECTED  ?LABBENZ NONE DETECTED  ?AMPHETMU NONE DETECTED  ?THCU NONE DETECTED  ?LABBARB NONE DETECTED  ?  ?Alcohol Level No results for input(s): ETH in the last 168 hours. ? ?IMAGING past 24 hours ?MR BRAIN WO CONTRAST ? ?Result Date: 05/21/2021 ?CLINICAL DATA:  Neuro deficit, acute, stroke suspected. Acute presentation with left-sided weakness. EXAM: MRI HEAD WITHOUT CONTRAST TECHNIQUE: Multiplanar, multiecho pulse sequences of the brain and surrounding structures were obtained without intravenous contrast. COMPARISON:  CT studies earlier same day.  MRI 08/06/2016. FINDINGS: Brain: Extensive old small vessel infarctions are seen affecting the pons. No focal cerebellar insult. There is an old infarction in the right PCA territory affecting the posteromedial temporal lobe and occipital lobe. This could be late subacute. Old small vessel infarctions are seen affecting the thalami I and throughout the cerebral hemispheric white matter. Foci of restricted diffusion are noted within the deep white matter adjacent to the posterior body of the right lateral ventricle, the anterior body of the right lateral ventricle in the posterior body of the left lateral ventricle. Few punctate foci of hemosiderin deposition present within the brain associated with  some of the old infarctions. No mass, hydrocephalus or extra-axial collection. Vascular: Major vessels at the base of the brain show flow. Skull and upper cervical spine: Negative Sinuses/Orbits: Clear/normal Other: None IMPRESSION: Advanced chronic small-vessel ischemic changes throughout the brain. Acute small-vessel deep white matter infarctions adjacent to the posterior body of the right lateral ventricle, the anterior body of the right lateral ventricle in the posterior body of the left lateral ventricle. Old right frontal operculum stroke. Late subacute to old infarction of the right  posteromedial temporal lobe and occipital lobe. Electronically Signed   By: Paulina Fusi M.D.   On: 05/21/2021 10:13  ? ?DG CHEST PORT 1 VIEW ? ?Result Date: 05/21/2021 ?CLINICAL DATA:  CVA. EXAM: PORTABLE CHEST 1 VIEW COMPARISON:  07/23/2015 FINDINGS: The cardiac silhouette, mediastinal and hilar contours are within normal limits given the AP projection and portable technique. No acute pulmonary findings. No pulmonary lesions or pneumothorax. The bony thorax is intact. IMPRESSION: No acute cardiopulmonary findings.  He Electronically Signed   By: Rudie Meyer M.D.   On: 05/21/2021 18:46  ? ?ECHOCARDIOGRAM COMPLETE BUBBLE STUDY ? ?Result Date: 05/21/2021 ?   ECHOCARDIOGRAM REPORT   Patient Name:   Gwendolyn Morales Date of Exam: 05/21/2021 Medical Rec #:  [id number]       Height:       68.0 in Accession #:    [id number]      Weight:       245.6 lb Date of Birth:  [birth year]       BSA:          2.230 m? Patient Age:    56 years        BP:           182/97 mmHg Patient Gender: F               HR:           95 bpm. Exam Location:  Inpatient Procedure: 2D Echo, Cardiac Doppler, Color Doppler, Saline Contrast Bubble Study            and Intracardiac Opacification Agent                               MODIFIED REPORT: This report was modified by Thomasene Ripple DO on 05/21/2021 due to update report.  Indications:     CVA  History:         Patient has prior history of Echocardiogram examinations, most                  recent 07/14/2015. Risk Factors:Hypertension.  Sonographer:     Gertie Fey MHA, RDMS, RVT, RDCS Referring Phys:  [id number] [name] [name] Diagnosing Phys: Lavona Mound Tobb DO  Sonographer Comments: Technically challenging study due to limited acoustic windows, Technically difficult study due to poor echo windows and patient is morbidly obese. Image acquisition challenging due to patient body habitus and restricted mobility. IMPRESSIONS  1. Left ventricular ejection fraction, by estimation, is 60 to 65%. The left ventricle  has normal function. The left ventricle has no regional wall motion abnormalities. There is mild concentric left ventricular hypertrophy. Left ventricular diastolic parameters are indeterminate.  2. Right ventricular systolic function is normal. The right ventricular size is normal. Tricuspid regurgitation signal is inadequate for assessing PA pressure.  3. There is a calcified/fibrinous mobile mass on the posterior leaflet of the mitral valve, which could be due to degenerative  mitral annular calcification. Recommend further testing with TEE for clarification if clinically appropriate. No evidence of mitral valve regurgitation. Mild mitral stenosis. The mean mitral valve gradient is 4.0 mmHg.  4. The aortic valve is normal in structure. Aortic valve regurgitation is not visualized. No aortic stenosis is present.  5. The inferior vena cava is normal in size with greater than 50% respiratory variability, suggesting right atrial pressure of 3 mmHg.  6. Poor quality agitated saline study unable to exclude PFO. FINDINGS  Left Ventricle: Left ventricular ejection fraction, by estimation, is 60 to 65%. The left ventricle has normal function. The left ventricle has no regional wall motion abnormalities. Definity contrast agent was given IV to delineate the left ventricular  endocardial borders. The left ventricular internal cavity size was normal in size. There is mild concentric left ventricular hypertrophy. Left ventricular diastolic parameters are indeterminate. Right Ventricle: The right ventricular size is normal. No increase in right ventricular wall thickness. Right ventricular systolic function is normal. Tricuspid regurgitation signal is inadequate for assessing PA pressure. Left Atrium: Left atrial size was normal in size. Right Atrium: Right atrial size was normal in size. Pericardium: There is no evidence of pericardial effusion. Mitral Valve: There is a calcified/fibrinous mobile mass on the posterior leaflet  of the mitral valve, which could be due to degenerative mitral annular calcification. Recommend further testing with TEE for clarification if clinically appropriate. No evidence of mitral valve regurgitati

## 2021-05-22 NOTE — Progress Notes (Signed)
Inpatient Rehabilitation Admissions Coordinator  ? ?I met with patient and her spouse at bedside. Patient previously a CIR patient in 2017. W discussed goals and expectations for a possible Cir admit and they are in agreement. I will begin Auth with her Medicaid for a possible admit. ? ?Danne Baxter, RN, MSN ?Rehab Admissions Coordinator ?[phone] ?05/22/2021 2:30 PM ? ?

## 2021-05-22 NOTE — Progress Notes (Signed)
Inpatient Rehab Admissions Coordinator:  ? ?Per therapy recommendations,  patient was screened for CIR candidacy by Darwin Guastella, MS, CCC-SLP. At this time, Pt. Appears to be a a potential candidate for CIR. I will place   order for rehab consult per protocol for full assessment. Please contact me any with questions. ? ?Leviathan Macera, MS, CCC-SLP ?Rehab Admissions Coordinator  ?[phone] (celll) ?[phone] (office) ? ?

## 2021-05-22 NOTE — Progress Notes (Signed)
Physical Therapy Evaluation ?Patient Details ?Name: Gwendolyn SimonSherry Fath ?MRN: [id number] ?DOB: 05-03-1965 ?Today's Date: 05/22/2021 ? ?History of Present Illness ? 56 y.o. female presents with complaints of left-sided weakness. MRI + Advanced chronic small-vessel ischemic changes; acute small-vessel deep white matter infarctions adjacent to the posterior body of the right lateral ventricle, the anterior body of the right lateral ventricle in the posterior body of the left  lateral ventricle; late subacutte to old infarct of the R posteromedial temporal and occipital lobes. Medical history significant of hypertension and CVA with ICH in 2017 with residual right-sided weakness  ?Clinical Impression ? Pt admitted with/for L sided weakness.  See MRI above.  Pt needing mod to max assist of 2 persons.  Pt currently limited functionally due to the problems listed. ( See problems list.)   Pt will benefit from PT to maximize function and safety in order to get ready for next venue listed below. ?   ?   ? ?Recommendations for follow up therapy are one component of a multi-disciplinary discharge planning process, led by the attending physician.  Recommendations may be updated based on patient status, additional functional criteria and insurance authorization. ? ?Follow Up Recommendations Acute inpatient rehab (3hours/day) ? ?  ?Assistance Recommended at Discharge Frequent or constant Supervision/Assistance  ?Patient can return home with the following ? A little help with walking and/or transfers;A little help with bathing/dressing/bathroom;Assistance with cooking/housework;Assist for transportation;Help with stairs or ramp for entrance ? ?  ?Equipment Recommendations Other (comment) (TBD)  ?Recommendations for Other Services ? Rehab consult  ?  ?Functional Status Assessment Patient has had a recent decline in their functional status and demonstrates the ability to make significant improvements in function in a reasonable and  predictable amount of time.  ? ?  ?Precautions / Restrictions    ? ?  ? ?Mobility ? Bed Mobility ?  ?  ?  ?  ?  ?  ?  ?  ?  ? ?Transfers ?Overall transfer level: Needs assistance ?  ?Transfers: Sit to/from Stand, Bed to chair/wheelchair/BSC ?Sit to Stand: Mod assist, Max assist, +2 physical assistance ?  ?  ?Squat pivot transfers: +2 physical assistance, Mod assist ?  ?  ?  ?  ? ?Ambulation/Gait ?  ?  ?  ?  ?  ?  ?  ?General Gait Details: NT ? ?Stairs ?  ?  ?  ?  ?  ? ?Wheelchair Mobility ?  ? ?Modified Rankin (Stroke Patients Only) ?Modified Rankin (Stroke Patients Only) ?Pre-Morbid Rankin Score: Moderate disability ?Modified Rankin: Severe disability ? ?  ? ?Balance Overall balance assessment: Needs assistance ?Sitting-balance support: No upper extremity supported, Feet supported ?Sitting balance-Leahy Scale: Fair ?Sitting balance - Comments: pt with weak w/shift, but able to balance well within BOS ?  ?Standing balance support: During functional activity, Bilateral upper extremity supported ?Standing balance-Leahy Scale: Poor ?Standing balance comment: posterior bias once up on her feet.  Bil LE blocked, bed pad used to impose more stability and help pt to attain a more upright posture. ?  ?  ?  ?  ?  ?  ?  ?  ?  ?  ?  ?   ? ? ? ?Pertinent Vitals/Pain Pain Assessment ?Pain Assessment: Faces ?Faces Pain Scale: Hurts a little bit ?Pain Location: head ?Pain Descriptors / Indicators: Aching ?Pain Intervention(s): Monitored during session  ? ? ?Home Living Family/patient expects to be discharged to:: Private residence ?Living Arrangements: Spouse/significant other;Children ?Available Help at Discharge: Family ?Type  of Home: House ?Home Access: Level entry;Ramped entrance ?  ?  ?  ?Home Layout: One level ?Home Equipment: Rolling Walker (2 wheels);BSC/3in1;Shower seat;Grab bars - toilet;Grab bars - tub/shower;Hand held shower head;Wheelchair - manual ?   ?  ?Prior Function Prior Level of Function : Needs assist ?  ?  ?   ?Physical Assist : ADLs (physical) ?  ?  ?Mobility Comments: uses RW ?ADLs Comments: family helps with IADL as needed ?  ? ? ?Hand Dominance  ? Dominant Hand: Right ? ?  ?Extremity/Trunk Assessment  ? Upper Extremity Assessment ?Upper Extremity Assessment: Defer to OT evaluation ?  ? ?Lower Extremity Assessment ?Lower Extremity Assessment: LLE deficits/detail;RLE deficits/detail ?RLE Deficits / Details: mildly weak, sesory intact. ?RLE Coordination: decreased fine motor ?LLE Deficits / Details: weak,unable to move against gravity, gross extention 3-/5, sensation intact. ?LLE Coordination: decreased fine motor ?  ? ?Cervical / Trunk Assessment ?Cervical / Trunk Assessment: Kyphotic  ?Communication  ? Communication: Expressive difficulties  ?Cognition Arousal/Alertness: Awake/alert ?Behavior During Therapy: [facility] for tasks assessed/performed ?Overall Cognitive Status: Within Functional Limits for tasks assessed ?  ?  ?  ?  ?  ?  ?  ?  ?  ?  ?  ?  ?  ?  ?  ?  ?  ?  ?  ? ?  ?General Comments General comments (skin integrity, edema, etc.): VSS on RA.  Pt's husband present throughout session and provided answers to environmental/ PLOF questions. ? ?  ?Exercises    ? ?Assessment/Plan  ?  ?PT Assessment Patient needs continued PT services  ?PT Problem List Decreased activity tolerance;Decreased range of motion;Decreased balance;Decreased mobility;Decreased coordination;Decreased strength ? ?   ?  ?PT Treatment Interventions Functional mobility training;Therapeutic activities;DME instruction;Balance training;Neuromuscular re-education;Patient/family education   ? ?PT Goals (Current goals can be found in the Care Plan section)  ?Acute Rehab PT Goals ?Patient Stated Goal: not stated ?PT Goal Formulation: With patient ?Time For Goal Achievement: 06/05/21 ?Potential to Achieve Goals: Fair ? ?  ?Frequency Min 4X/week ?  ? ? ?Co-evaluation PT/OT/SLP Co-Evaluation/Treatment: Yes ?Reason for Co-Treatment: For patient/therapist  safety ?PT goals addressed during session: Mobility/safety with mobility ?  ?  ? ? ?  ?AM-PAC PT "6 Clicks" Mobility  ?Outcome Measure Help needed turning from your back to your side while in a flat bed without using bedrails?: A Lot ?Help needed moving from lying on your back to sitting on the side of a flat bed without using bedrails?: A Lot ?Help needed moving to and from a bed to a chair (including a wheelchair)?: Total ?Help needed standing up from a chair using your arms (e.g., wheelchair or bedside chair)?: Total ?Help needed to walk in hospital room?: Total ?Help needed climbing 3-5 steps with a railing? : Total ?6 Click Score: 8 ? ?  ?End of Session Equipment Utilized During Treatment: Gait belt ?Activity Tolerance: Patient tolerated treatment well ?Patient left: in chair;with call bell/phone within reach;with chair alarm set;Other (comment) (pt left on a maximove lift pad.) ?  ?PT Visit Diagnosis: Muscle weakness (generalized) (M62.81);Hemiplegia and hemiparesis;Other symptoms and signs involving the nervous system (R29.898) ?Hemiplegia - Right/Left: Left (right residual) ?Hemiplegia - dominant/non-dominant: Non-dominant ?Hemiplegia - caused by: Nontraumatic intracerebral hemorrhage ?  ? ?Time: 9811-9147 ?PT Time Calculation (min) (ACUTE ONLY): 29 min ? ? ?Charges:   PT Evaluation ?$PT Eval Moderate Complexity: 1 Mod ?  ?  ?   ? ? ?05/22/2021 ? ?Jacinto Halim., PT ?Acute Rehabilitation Services ?[phone]  (  pager) ?[phone]  (office) ? ?Eliseo Gum Ryka Beighley ?05/22/2021, 12:51 PM ?

## 2021-05-22 NOTE — Progress Notes (Signed)
?PROGRESS NOTE ? ? ? ?Gwendolyn Morales  [id number] DOB: 1965-08-10 DOA: 05/21/2021 ?PCP: [name], MD  ? ? ?Chief Complaint  ?Patient presents with  ? Code Stroke  ? ? 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  Patient also noted to have some dysarthric speech that had worsened.  Normally patient noted to ambulate with the use of a walker at baseline since her prior stroke.  Patient brought to the ED as a code stroke.  On presentation patient noted to have a blood pressure of 204/148 with normal O2.  CT head done with signs of subacute right PCA stroke and chronic right frontal stroke.  Patient noted to not be a candidate for thrombolytics due to prior history of intracranial hemorrhage.  CT angiogram head and neck done was negative for LVO.  MRI significant for acute small vessel deep white matter infarcts adjacent to the posterior body of the right lateral ventricle, anterior body of the right lateral ventricle, posterior body of the left lateral ventricle and related to old infarct of the right posterior medial temporal lobe and occipital lobe.  Urinalysis also concerning for UTI.  Patient admitted for stroke work-up.  Neurology consulted and following  ? ? ?Assessment & Plan: ? Principal Problem: ?  CVA (cerebral vascular accident) ([place]) ?Active Problems: ?  Hypertensive emergency ?  Hypokalemia ?  Productive cough ?  Obesity (BMI 30-39.9) ?  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  CKD (chronic kidney disease) stage 3, GFR 30-59 ml/min (HCC) ?  History of CVA with residual deficit ? ? ? ?Assessment and Plan: ?* CVA (cerebral vascular accident) ([place]) ?Patient present with complaints of left-sided weakness upon awakening the morning of admission.   ?-CT with signs of a subacute right PCA stroke  with chronic right frontal stroke.  Patient was not a thrombolytic candidate due to prior history of intracranial hemorrhage.  ?- CTA of the head and neck did not note any large vessel occlusion. ?-Patient seen in consultation by neurology who recommended admission for stroke work-up. ?-MRI brain done with chronic small vessel disease, acute small vessel deep white matter infarctions adjacent to the posterior body of the right lateral ventricle, anterior body of the right lateral ventricle, posterior body of the left lateral ventricle, late subacute infarct of the right posterior medial temporal lobe and occipital lobe. ?- ?- echocardiogram -limited study due to patient body habitus.  EF noted to be 60 to 65% with indeterminate diastolic parameters.  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  Message sent to [name] in order to help arrange TEE which.  Stroke team will be done on Friday. ?-TCD bubble study pending ?-LDL at 118. ?-Hemoglobin A1c 5.4. ?-PT/OT/ST. ?-Permissive hypertension for initial 24 hours. ?-Start low-dose Norvasc 2.5 mg daily. ?-Patient to be started on a statin. ? ?-Patient being followed by the stroke team who recommended aspirin and Plavix x3 weeks, and then aspirin alone. ?-Per neurology concern for sleep apnea considering participation in sleep smog stroke prevention study which is being managed by neurology. ?-Appreciate neurology consultative services, will follow-up any further recommendations ? ?Hypertensive emergency ?-On admission blood pressure was elevated up to 204/148.  Patient had not been  taking any of her home blood pressure medications since possibly August 2022. ?- Previous blood pressure regimen included amlodipine 10 mg daily, hydralazine 50 mg 3 times daily, hydralazine 12.5 mg daily, and metoprolol 100 mg  twice daily. ?-Allow for permissive hypertension given acute stroke. ?-We will start low-dose Norvasc 2.5 mg daily. ?-Hydralazine IV as needed for systolic blood pressures greater than 220 or diastolic blood pressure greater than 110 ? ?Hypokalemia ?Acute.  Initial potassium 3.4. ?-Repeat labs in the morning. ? ?CKD (chronic kidney disease) stage 3, GFR 30-59 ml/min (HCC) ? Creatinine 1.21 with BUN 10.  Baseline creatinine previously had been around 0.9 back in 2019 per records on Care Everywhere.  This may constitute an acute kidney injury. ?-Urinalysis concerning for UTI with large leukocytes, positive nitrite, many bacteria, WBC > 50.  Protein of 100. ?-Renal function trending down with gentle hydration ?-Saline lock IV fluids. ? 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 ?-PT/OT. ? ?Obesity (BMI 30-39.9) ?BMI initially calculated at 37.3 kg/m?. ?-Lifestyle modification. ?-Outpatient follow-up with PCP. ? ?Productive cough ?Acute on chronic.  Patient has reportedly had this intermittent cough for which she takes Mucinex at home.  Lung sounds on exam appear to be clear. ?-Incentive spirometry ?-Mucinex ?-Check chest x-ray which noted minimal bibasilar atelectasis  ?-Incentive spirometry, flutter valve. ? ? ? ? ?  ? ? ?DVT prophylaxis: Lovenox ?Code Status: Full ?Family Communication: Updated patient and husband at bedside. ?Disposition: CIR hopefully ? ?Status is: Inpatient ?Remains inpatient appropriate because: Severity of illness. ?  ?Consultants:  ?Neurology: Dr.Khaliqdina 05/21/2021 ? ?Procedures:  ?CT head 05/21/2021 ?CT angiogram head and neck 05/21/2021 ?2D echo with bubble study 05/21/2021 ?MRI brain 05/21/2021 ? ? ?Antimicrobials:  ?IV Rocephin 05/22/2021>>>> ? ? ?Subjective: ?Patient sitting up in chair.  No significant change  with left-sided weakness.  No chest pain.  No shortness of breath.  Husband at bedside. ? ?Objective: ?Vitals:  ? 05/22/21 1023 05/22/21 1130 05/22/21 1400 05/22/21 1620  ?BP: (!) 171/109 (!) 157/126 (!) 172/104 (!) 168/103  ?Pulse: (!) 103 (!) 102  99  ?Resp:  18  20  ?Temp:  97.8 ?F (36.6 ?C)  97.6 ?F (36.4 ?C)  ?TempSrc:  Oral  Oral  ?SpO2: 96% 97%  96%  ?Weight:      ? ? ?Intake/Output Summary (Last 24 hours) at 05/22/2021 1921 ?Last data filed at 05/22/2021 1842 ?Gross per 24 hour  ?Intake 460 ml  ?Output 1500 ml  ?Net -1040 ml  ? ?Filed Weights  ? 05/21/21 0800  ?Weight: 111.4 kg  ? ? ?Examination: ? ?General exam: Appears calm and comfortable  ?Respiratory system: Clear to auscultation anterior lung fields.  No wheezes, no crackles, no rhonchi.  Fair air movement. Respiratory effort normal. ?Cardiovascular system: S1 & S2 heard, RRR. No JVD, murmurs, rubs, gallops or clicks. No pedal edema. ?Gastrointestinal system: Abdomen is nondistended, soft and nontender. No organomegaly or masses felt. Normal bowel sounds heard. ?Central nervous system: Alert and oriented.  Dysarthric speech.  5/5 right upper extremity strength.  3-4/5 right lower extremity strength.  Left hemiparesis with 2-3/5 left upper extremity strength.  2/5 left lower extremity strength.  Gait not tested secondary to safety.  Sensation intact.  Finger-to-nose intact on the right.  Unable to complete finger-to-nose on the left due to left hemiparesis. ?Extremities: Symmetric 5 x 5  power. ?Skin: No rashes, lesions or ulcers ?Psychiatry: Judgement and insight appear normal. Mood & affect appropriate.  ? ? ? ?Data Reviewed:  ? ?CBC: ?Recent Labs  ?Lab 05/21/21 ?0806 05/21/21 ?0811  ?WBC 9.2  --   ?NEUTROABS 6.8  --   ?HGB 15.7* 15.6*  ?HCT 50.0* 46.0  ?MCV 84.3  --   ?PLT 290  --   ? ? ?Basic Metabolic Panel: ?Recent Labs  ?Lab 05/21/21 ?0806 05/21/21 ?0811  ?NA 142 144  ?K 3.4* 3.4*  ?CL 109 109  ?CO2 24  --   ?GLUCOSE 143* 139*  ?BUN 10 11   ?CREATININE 1.21* 1.10*  ?CALCIUM 10.0  --   ? ? ?GFR: ?CrCl cannot be calculated (Unknown ideal weight.). ? ?Liver Function Tests: ?Recent Labs  ?Lab 05/21/21 ?0806  ?AST 31  ?ALT 38  ?ALKPHOS 129*  ?BILITOT 0.5

## 2021-05-22 NOTE — Evaluation (Signed)
Occupational Therapy Evaluation Patient Details Name: Gwendolyn Morales MRN: [id number] DOB: 1965-09-03 Today's Date: 05/22/2021   History of Present Illness 56 y.o. female presents with complaints of left-sided weakness. MRI + Advanced chronic small-vessel ischemic changes; acute small-vessel deep white matter infarctions adjacent to the posterior body of the right lateral ventricle, the anterior body of the right lateral ventricle in the posterior body of the left  lateral ventricle; late subacutte to old infarct of the R posteromedial temporal and occipital lobes. 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 Family assists with some IADL tasks (cooking), however pt usually manages her own meds, finances and enjoys spending time on the computer.  Pt presents with a significant functional change, requiring Max A +2 for mobility (squat pivot transfer) and Max to total A for ADL tasks. Feel pt would be an excellent candidate for CIR with goal to DC home with 24/7 S @ wc level most likely. Acute OT to follow.      Recommendations for follow up therapy are one component of a multi-disciplinary discharge planning process, led by the attending physician.  Recommendations may be updated based on patient status, additional functional criteria and insurance authorization.   Follow Up Recommendations  Acute inpatient rehab (3hours/day)    Assistance Recommended at Discharge Frequent or constant Supervision/Assistance  Patient can return home with the following Two people to help with walking and/or transfers;Two people to help with bathing/dressing/bathroom;Assistance with cooking/housework;Assistance with feeding;Direct supervision/assist for medications management;Direct supervision/assist for financial  management;Assist for transportation;Help with stairs or ramp for entrance    Functional Status Assessment  Patient has had a recent decline in their functional status and demonstrates the ability to make significant improvements in function in a reasonable and predictable amount of time.  Equipment Recommendations  Hospital bed    Recommendations for Other Services Rehab consult     Precautions / Restrictions Precautions Precautions: Fall      Mobility Bed Mobility Overal bed mobility: Needs Assistance Bed Mobility: Rolling, Sidelying to Sit Rolling: Mod assist Sidelying to sit: Max assist            Transfers Overall transfer level: Needs assistance   Transfers: Sit to/from Stand, Bed to chair/wheelchair/BSC Sit to Stand: Max assist, +2 physical assistance   Squat pivot transfers: +2 physical assistance, Max assist              Balance Overall balance assessment: Needs assistance Sitting-balance support: No upper extremity supported, Feet supported Sitting balance-Leahy Scale: Fair Sitting balance - Comments: pt with weak w/shift, but able to balance well within BOS   Standing balance support: During functional activity, Bilateral upper extremity supported Standing balance-Leahy Scale: Poor Standing balance comment: posterior bias once up on her feet.  Bil LE blocked, bed pad used to impose more stability and help pt to attain a more upright posture.; significant fear of falling                           ADL either performed or assessed with clinical judgement   ADL Overall ADL's : Needs assistance/impaired Eating/Feeding: Minimal assistance   Grooming: Moderate assistance   Upper Body Bathing: Moderate assistance   Lower Body Bathing: Maximal assistance;Bed level   Upper Body Dressing : Maximal assistance  Lower Body Dressing: Total assistance       Toileting- Clothing Manipulation and Hygiene: Total assistance       Functional  mobility during ADLs: +2 for physical assistance;Maximal assistance       Vision Patient Visual Report: No change from baseline Vision Assessment?: Yes Eye Alignment: Within Functional Limits Ocular Range of Motion: Within Functional Limits Alignment/Gaze Preference: Within Defined Limits Tracking/Visual Pursuits: Decreased smoothness of horizontal tracking;Decreased smoothness of vertical tracking Saccades: Additional head turns occurred during testing;Additional eye shifts occurred during testing Convergence: Within functional limits Visual Fields: Left visual field deficit Additional Comments: missign targets at times in L field; poor visual attention     Perception  Impaired; L inattention; missing targets in L field during double simultaneous stimulation   Praxis      Pertinent Vitals/Pain Pain Assessment Pain Assessment: Faces Faces Pain Scale: Hurts a little bit Pain Location: head Pain Descriptors / Indicators: Aching Pain Intervention(s): Limited activity within patient's tolerance     Hand Dominance Right   Extremity/Trunk Assessment Upper Extremity Assessment Upper Extremity Assessment: RUE deficits/detail;LUE deficits/detail RUE Deficits / Details: generalized weakness but functional LUE Deficits / Details: PROM overall WFL, increased spasticity; moves in flexor pattern however ablet o move out of synergy; not functional at this time LUE Sensation: decreased light touch;decreased proprioception LUE Coordination: decreased fine motor;decreased gross motor   Lower Extremity Assessment Lower Extremity Assessment: Defer to PT evaluation RLE Deficits / Details: mildly weak, sesory intact. RLE Coordination: decreased fine motor LLE Deficits / Details: weak,unable to move against gravity, gross extention 3-/5, sensation intact. LLE Coordination: decreased fine motor   Cervical / Trunk Assessment Cervical / Trunk Assessment: Kyphotic   Communication  Communication Communication: Expressive difficulties; hypophonic   Cognition Arousal/Alertness: Awake/alert Behavior During Therapy: WFL for tasks assessed/performed Overall Cognitive Status: Impaired/Different from baseline Area of Impairment: Attention, Safety/judgement, Awareness, Problem solving                   Current Attention Level: Sustained     Safety/Judgement: Decreased awareness of safety Awareness: Emergent Problem Solving: Slow processing General Comments: will continue to assess     General Comments  VSS    Exercises Exercises: Other exercises Other Exercises Other Exercises: L hand elevated; encouraged PROM in to elbow extension/wrist extension   Shoulder Instructions      Home Living Family/patient expects to be discharged to:: Private residence Living Arrangements: Spouse/significant other;Children Available Help at Discharge: Family Type of Home: House Home Access: Level entry;Ramped entrance     Home Layout: One level     Bathroom Shower/Tub: Walk-in shower;Door   [employer] Toilet: Handicapped height Bathroom Accessibility: Yes How Accessible: Accessible via walker Home Equipment: Rolling Walker (2 wheels);BSC/3in1;Shower seat;Grab bars - toilet;Grab bars - tub/shower;Hand held shower head;Wheelchair - manual          Prior Functioning/Environment Prior Level of Function : Needs assist       Physical Assist : ADLs (physical) (PRN)     Mobility Comments: uses RW ADLs Comments: family helps with IADL as needed        OT Problem List: Decreased strength;Decreased range of motion;Decreased activity tolerance;Impaired balance (sitting and/or standing);Impaired vision/perception;Decreased coordination;Decreased cognition;Decreased safety awareness;Decreased knowledge of use of DME or AE;Decreased knowledge of precautions;Cardiopulmonary status limiting activity;Impaired sensation;Impaired tone;Obesity;Impaired UE functional  use;Pain;Increased edema      OT Treatment/Interventions: Self-care/ADL training;Therapeutic exercise;Neuromuscular education;Energy conservation;DME and/or AE instruction;Splinting;Therapeutic activities;Cognitive remediation/compensation;Visual/perceptual remediation/compensation;Patient/family education;Balance training    OT Goals(Current goals can  be found in the care plan section) Acute Rehab OT Goals Patient Stated Goal: to get better OT Goal Formulation: With patient/family Time For Goal Achievement: 06/05/21 Potential to Achieve Goals: Good  OT Frequency: Min 2X/week    Co-evaluation PT/OT/SLP Co-Evaluation/Treatment: Yes Reason for Co-Treatment: For patient/therapist safety;To address functional/ADL transfers PT goals addressed during session: Mobility/safety with mobility OT goals addressed during session: ADL's and self-care      AM-PAC OT "6 Clicks" Daily Activity     Outcome Measure Help from another person eating meals?: A Little Help from another person taking care of personal grooming?: A Lot Help from another person toileting, which includes using toliet, bedpan, or urinal?: Total Help from another person bathing (including washing, rinsing, drying)?: A Lot Help from another person to put on and taking off regular upper body clothing?: A Lot Help from another person to put on and taking off regular lower body clothing?: Total 6 Click Score: 11   End of Session Equipment Utilized During Treatment: Gait belt Nurse Communication: Mobility status;Need for lift equipment  Activity Tolerance: Patient tolerated treatment well Patient left: in chair;with call bell/phone within reach;with chair alarm set;with family/visitor present  OT Visit Diagnosis: Unsteadiness on feet (R26.81);Other abnormalities of gait and mobility (R26.89);Muscle weakness (generalized) (M62.81);History of falling (Z91.81);Other symptoms and signs involving cognitive function;Other symptoms and  signs involving the nervous system (R29.898);Hemiplegia and hemiparesis Hemiplegia - Right/Left: Left Hemiplegia - dominant/non-dominant: Non-Dominant Hemiplegia - caused by: Cerebral infarction                Time: 1610-9604 OT Time Calculation (min): 38 min Charges:  OT General Charges $OT Visit: 1 Visit OT Evaluation $OT Eval Moderate Complexity: 1 Mod OT Treatments $Self Care/Home Management : 8-22 mins  Luisa Dago, OT/L   Acute OT Clinical Specialist Acute Rehabilitation Services Pager [phone] Office [phone]   [facility] 05/22/2021, 1:24 PM

## 2021-05-23 ENCOUNTER — Inpatient Hospital Stay (HOSPITAL_COMMUNITY): Payer: Medicaid Other

## 2021-05-23 ENCOUNTER — Encounter (HOSPITAL_COMMUNITY): Payer: Self-pay | Admitting: Internal Medicine

## 2021-05-23 DIAGNOSIS — M25519 Pain in unspecified shoulder: Secondary | ICD-10-CM

## 2021-05-23 DIAGNOSIS — I639 Cerebral infarction, unspecified: Secondary | ICD-10-CM

## 2021-05-23 DIAGNOSIS — I6930 Unspecified sequelae of cerebral infarction: Secondary | ICD-10-CM | POA: Diagnosis not present

## 2021-05-23 DIAGNOSIS — I161 Hypertensive emergency: Secondary | ICD-10-CM | POA: Diagnosis not present

## 2021-05-23 DIAGNOSIS — N1831 Chronic kidney disease, stage 3a: Secondary | ICD-10-CM | POA: Diagnosis not present

## 2021-05-23 DIAGNOSIS — B9620 Unspecified Escherichia coli [E. coli] as the cause of diseases classified elsewhere: Secondary | ICD-10-CM | POA: Diagnosis present

## 2021-05-23 DIAGNOSIS — M25569 Pain in unspecified knee: Secondary | ICD-10-CM

## 2021-05-23 LAB — BASIC METABOLIC PANEL
Anion gap: 6 (ref 5–15)
BUN: 11 mg/dL (ref 6–20)
CO2: 24 mmol/L (ref 22–32)
Calcium: 9.9 mg/dL (ref 8.9–10.3)
Chloride: 109 mmol/L (ref 98–111)
Creatinine, Ser: 1.08 mg/dL — ABNORMAL HIGH (ref 0.44–1.00)
GFR, Estimated: >60 mL/min (ref >60)
Glucose, Bld: 112 mg/dL — ABNORMAL HIGH (ref 70–99)
Potassium: 3.6 mmol/L (ref 3.5–5.1)
Sodium: 139 mmol/L (ref 135–145)

## 2021-05-23 LAB — CBC
HCT: 45.7 % (ref 36.0–46.0)
Hemoglobin: 14.4 g/dL (ref 12.0–15.0)
MCH: 25.9 pg — ABNORMAL LOW (ref 26.0–34.0)
MCHC: 31.5 g/dL (ref 30.0–36.0)
MCV: 82.3 fL (ref 80.0–100.0)
Platelets: 341 10*3/uL (ref 150–400)
RBC: 5.55 MIL/uL — ABNORMAL HIGH (ref 3.87–5.11)
RDW: 16.1 % — ABNORMAL HIGH (ref 11.5–15.5)
WBC: 10.8 10*3/uL — ABNORMAL HIGH (ref 4.0–10.5)
nRBC: 0 % (ref 0.0–0.2)

## 2021-05-23 LAB — PROTEIN C, TOTAL: Protein C, Total: 128 % (ref 60–150)

## 2021-05-23 LAB — MAGNESIUM: Magnesium: 2 mg/dL (ref 1.7–2.4)

## 2021-05-23 LAB — HOMOCYSTEINE: Homocysteine: 10.3 umol/L (ref 0.0–14.5)

## 2021-05-23 MED ORDER — AMLODIPINE BESYLATE 5 MG PO TABS
5.0000 mg | ORAL_TABLET | Freq: Every day | ORAL | Status: DC
  Administered 2021-05-23 – 2021-05-24 (×2): 5 mg via ORAL
  Filled 2021-05-23 (×2): qty 1

## 2021-05-23 MED ORDER — METOPROLOL TARTRATE 25 MG PO TABS
25.0000 mg | ORAL_TABLET | Freq: Two times a day (BID) | ORAL | Status: DC
  Administered 2021-05-23 – 2021-05-24 (×3): 25 mg via ORAL
  Filled 2021-05-23 (×3): qty 1

## 2021-05-23 MED ORDER — METOPROLOL TARTRATE 12.5 MG HALF TABLET
12.5000 mg | ORAL_TABLET | Freq: Two times a day (BID) | ORAL | Status: DC
  Administered 2021-05-23: 12.5 mg via ORAL
  Filled 2021-05-23: qty 1

## 2021-05-23 MED ORDER — POTASSIUM CHLORIDE CRYS ER 10 MEQ PO TBCR
40.0000 meq | EXTENDED_RELEASE_TABLET | Freq: Once | ORAL | Status: AC
  Administered 2021-05-23: 40 meq via ORAL
  Filled 2021-05-23: qty 4

## 2021-05-23 MED ORDER — HYDROXYZINE HCL 25 MG PO TABS
25.0000 mg | ORAL_TABLET | Freq: Three times a day (TID) | ORAL | Status: DC | PRN
  Administered 2021-05-23 – 2021-05-25 (×4): 25 mg via ORAL
  Filled 2021-05-23 (×4): qty 1

## 2021-05-23 NOTE — Progress Notes (Addendum)
? ? ?  [facility] has been requested to perform a transesophageal echocardiogram on this patient for stroke and possible mitral valve mass.  After careful review of history and examination, the risks and benefits of transesophageal echocardiogram have been explained including risks of esophageal damage, perforation (1:10,000 risk), bleeding, pharyngeal hematoma as well as other potential complications associated with anesthesia including aspiration, arrhythmia, respiratory failure and death. Alternatives to treatment were discussed, questions were answered. Patient is willing to proceed. She has mildly dysarthric speech with soft voice but is A+Ox3 and consentable. Son at bedside as well. This is scheduled for 8am tomorrow with Dr. Mayford Knife, orders written including NPO after midnight except sips with meds. ? ?Laurann Montana, PA-C ?05/23/2021 1:50 PM  ? ?

## 2021-05-23 NOTE — Progress Notes (Signed)
Lower extremity venous bilateral and TCD w/ bubble study completed.   Please see CV Proc for preliminary results.   Mykenzi Vanzile, RDMS, RVT  

## 2021-05-23 NOTE — Progress Notes (Signed)
STROKE TEAM PROGRESS NOTE  ? ?INTERVAL HISTORY ?Patient is working with physical therapist.  She continues to have significant left-sided weakness which has not improved.  He she has been recommended to go to inpatient rehab.  2D echo showed suspected mitral valve and TEE scheduled for tomorrow. ? ?Vitals:  ? 05/22/21 2346 05/23/21 0356 05/23/21 0800 05/23/21 1109  ?BP: (!) 154/81 (!) 169/98 (!) 182/137 (!) 184/116  ?Pulse: (!) 108 (!) 105 (!) 108 (!) 106  ?Resp: 20 20 18 18   ?Temp: 98.7 ?F (37.1 ?C) 98.1 ?F (36.7 ?C) 98.2 ?F (36.8 ?C) 98.2 ?F (36.8 ?C)  ?TempSrc: Oral Oral Oral   ?SpO2: 97% 97% 95% 96%  ?Weight:      ? ?CBC:  ?Recent Labs  ?Lab 05/21/21 ?0806 05/21/21 ?[id number] 05/23/21 ?0326  ?WBC 9.2  --  10.8*  ?NEUTROABS 6.8  --   --   ?HGB 15.7* 15.6* 14.4  ?HCT 50.0* 46.0 45.7  ?MCV 84.3  --  82.3  ?PLT 290  --  341  ? ?Basic Metabolic Panel:  ?Recent Labs  ?Lab 05/21/21 ?0806 05/21/21 ?[id number] 05/23/21 ?0326  ?NA 142 144 139  ?K 3.4* 3.4* 3.6  ?CL 109 109 109  ?CO2 24  --  24  ?GLUCOSE 143* 139* 112*  ?BUN 10 11 11   ?CREATININE 1.21* 1.10* 1.08*  ?CALCIUM 10.0  --  9.9  ?MG  --   --  2.0  ? ?Lipid Panel:  ?Recent Labs  ?Lab 05/22/21 ?0413  ?CHOL 184  ?TRIG 129  ?HDL 40*  ?CHOLHDL 4.6  ?VLDL 26  ?LDLCALC 118*  ? ?HgbA1c:  ?Recent Labs  ?Lab 05/22/21 ?0413  ?HGBA1C 5.4  ? ?Urine Drug Screen:  ?Recent Labs  ?Lab 05/21/21 ?2146  ?LABOPIA NONE DETECTED  ?COCAINSCRNUR NONE DETECTED  ?LABBENZ NONE DETECTED  ?AMPHETMU NONE DETECTED  ?THCU NONE DETECTED  ?LABBARB NONE DETECTED  ?  ?Alcohol Level No results for input(s): ETH in the last 168 hours. ? ?IMAGING past 24 hours ?VAS [place] TRANSCRANIAL DOPPLER W BUBBLES ? ?Result Date: 05/23/2021 ? Transcranial Doppler with Bubble Patient Name:  Gwendolyn Morales  Date of Exam:   05/23/2021 Medical Rec #: [id number]        Accession #:    [id number] Date of Birth: 02/13/1965        Patient Gender: F Patient Age:   59 years Exam Location:  [facility] Procedure:      VAS [place]  TRANSCRANIAL DOPPLER W BUBBLES Referring Phys: Janine Ores --------------------------------------------------------------------------------  Indications: Stroke. Performing Technologist: Darlin Coco RDMS, RVT  Examination Guidelines: A complete evaluation includes B-mode imaging, spectral Doppler, color Doppler, and power Doppler as needed of all accessible portions of each vessel. Bilateral testing is considered an integral part of a complete examination. Limited examinations for reoccurring indications may be performed as noted.  Summary:  A vascular evaluation was performed. The left middle cerebral artery was studied. An IV was inserted into the patient's left forearm. Verbal informed consent was obtained.  Between 16 and 50 high intensity transient signals (HITS) were observed at rest and with valsalva, indicating a Spencer grade 3 (moderate) patent foramen ovale (PFO). *See table(s) above for TCD measurements and observations.    Preliminary    ? ?PHYSICAL EXAM ? ?Physical Exam  ?Constitutional: Appears well-developed and well-nourished.  Middle-aged Caucasian lady.  She appears emotional and upset ?Cardiovascular: Normal rate and regular rhythm.  ?Respiratory: Effort normal, non-labored breathing ? ? ?Neuro: ?Mental Status: ?Patient is awake,  alert, oriented to person, place, month, year, and situation. ?Voice is soft.  No obvious cognitive deficit. ?No signs of aphasia or neglect ?Cranial Nerves: ?II: Visual Fields are full. Pupils are equal, round, and reactive to light.   ?III,IV, VI: EOMI without ptosis or diploplia.  ?V: Facial sensation is symmetric to temperature ?VII: Facial movement is symmetric resting but slight left nasolabial fold asymmetry while smiling ?VIII: Hearing is intact to voice ?X: voice is hypophonic ?XI: Shoulder shrug is symmetric. ?XII: Tongue protrudes midline without atrophy or fasciculations.  ?Motor: ?Tone is increased in the left. Bulk is normal.  ?RUE 5/5 left hemiparesis  with drift LUE 3/5 ?RLE 4/5 LLE 2/5 ?Sensory: ?Sensation is symmetric to light touch and temperature in the arms and legs. No extinction to DSS present.  ?Cerebellar: ?FNF intact on the right, unable to complete on the left ? ?NIHSS 7 .Premorbid modified Rankin score 3 ? ?ASSESSMENT/PLAN ?[name] is a 56 y.o. 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 She went to bed at 2130 on 05/20/21 and woke up in the morning with L sided weakness. EMS brought her in as a code stroke. Her BP was elevated to 200s over 130s.  MRI shows multiple acute small deep vessel white matter infarctions adjacent to the right and left lateral ventricles and a late subacute infarct of the right posterior medial temporal lobe and occipital lobe.  Plan for TCD with bubble study, TEE scheduled for Friday at 8 AM.  Consider sleep smart study work-up. ? 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  Subacute infarct in the right occipital lobe.  Old right frontal stroke.  Extensive chronic SVD ASPECTS 10.    ?CTA head & neck 50% stenosis of the right vertebral artery, 30% stenosis in both vertebral artery V4 segments, right PCA shows flow ?MRI chronic SVD, acute small vessel deep white matter infarctions adjacent to the posterior body of the right lateral ventricle, the anterior body of the right lateral ventricle, and the posterior body of the left lateral ventricle.  Late subacute infarct of the right posterior medial temporal lobe and occipital lobe. ?2D Echo EF 60-65% ?TCD with bubble study-positive for moderate size right to left shunt TEE Pending Friday ?LDL 118 ?HgbA1c 5.4 ?VTE prophylaxis -Lovenox ?   ?Diet   ? Diet Heart Room service appropriate? Yes; Fluid consistency: Thin  ? Diet NPO time specified Except for: Sips with Meds  ? ?aspirin 81 mg daily prior to admission, now on aspirin 81 mg daily and clopidogrel 75 mg daily. X 3 weeks and then aspirin alone ?Therapy recommendations: Pending ?Disposition: Pending ? ?Hypertension ?Home meds: Amlodipine 2.5 mg, metoprolol 100 mg, hydralazine 50 mg, hydrochlorothiazide 12.5 mg- not taking consistently ?Permissive hypertension (OK if < 220/120) but gradually normalize in 5-7 days ?Long-term BP goal normotensive ? ?Hyperlipidemia ?Home meds: Atorvastatin 40 mg, resumed in hospital ?LDL 118, goal < 70 ?Increase to atorvastatin 80 mg ?Continue statin at discharge ? ?Other Stroke Risk Factors ?Obesity, Body mass index is 37.34 kg/m?., BMI >/= 30 associated with increased stroke risk, recommend weight loss, diet and exercise as appropriate  ?Hx stroke/TIA ?Multifocal infarcts with hemorrhagic infarcts  ? ?Other Active Problems ?CKD  stage 3 ?Cr 1.21 -> 1.10 ?Hypokalemia ?K 3.4 ?Productive cough- chronic ?Home meds- mucinex ?CXR- bibasilar atelectasis ? ?Hospital day # 2 ? ? Patient `s neurological exam remains unchanged.  TCD bubble study performed at the bedside is positive for medium size right to left shunt.  Recommend lower extremity venous Dopplers.  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  Recommend continue   aspirin and Plavix for 3 weeks followed by aspirin alone.  Patient also appears to be at risk for sleep apnea and may benefit with consideration for participation in the sleep smart stroke prevention study.  I have given the information to review and decide.  Physical occupational and speech therapy consult and she will likely need inpatient rehab.  Long discussion patient and answered questions.  Discussed with Dr. Grandville Silos.  Greater than 50% time during this 35-minute visit was spent on counseling and coordination of care about multiple strokes and  discussion about stroke evaluation, prevention and treatment and answering questions ? ?Antony Contras, MD ?Medical Director ?[facility] ?Pager: [phone] ?05/23/2021 2:36 PM ? ? ?To contact Stroke

## 2021-05-23 NOTE — H&P (Incomplete)
? ? ?Physical Medicine and Rehabilitation Admission H&P ? ?  ?Chief Complaint  ?Patient presents with  ? Stroke with functional deficits  ? ? ?HPI: Gwendolyn Morales is a 56 year old RH-female with history of malignant HTN, left parietal ICH 2017 and received inpatient rehab services 07/24/2015 - 08/17/2015, mild left sided weakness.  Per chart review patient lives with spouse and family.  1 level home with ramped entrance.  Family did assist with some ADLs.  Presented 06/01/21 after awaking with left sided weakness of acute onset. BP elevated at admission and CTA head/neck was negative for LVO and showed 50% stenosis of L-VA and 30$ stenosis B-VA V4 segment. MRI brain showed advanced small vessel disease with acute small vessel deep white matter infarcts in anterior and posterior right lateral ventricle and posterior body of left lateral ventricle. TCD showed moderate size right to left shunt. TEE showed normal LVH, was negative for thrombus and showed severe mitral valve calcification with trivial AV sclerosis and mild grade 2 atherosclerotic plaque in thoracic and ascending aorta. BLE dopplers were negative for DVT.  Maintained on Lovenox for DVT prophylaxis. ? ?Dr. 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 Patient continues to be limited by left sided weakness with flexor tone and sensory deficits, generalized right sided weakness, dysarthria and ,working on pre-gait activity. CIR recommended due to functional decline.    ? ? ?Review of Systems  ?Constitutional:  Negative for chills and fever.  ?Respiratory:  Negative for cough and shortness of breath.   ?Cardiovascular:  Negative for chest pain and palpitations.  ?Gastrointestinal:  Positive for constipation and heartburn.  ?Musculoskeletal:  Positive for joint pain (left shoulder) and myalgias.  ?Neurological:  Positive for sensory change, speech change and focal weakness.   ?Psychiatric/Behavioral:  The patient does not have insomnia.   ? ? ?Past Medical History:  ?Diagnosis Date  ? Hypertension   ? Stroke [facility]) 07/2015  ? ICH with residual hemiparesis  ? ? ?Past Surgical History:  ?Procedure Laterality Date  ? CESAREAN SECTION    ? x 2  ? ? ?Family History  ?Problem Relation Age of Onset  ? Diabetes Mother   ? Hypertension Sister   ? ? ?Social History: Married. Husband works days. Lives in living room and goes from lift chair to recline chair. Was able to use walker to walk few steps --uses potty chair. She  reports that she has never smoked. She has never used smokeless tobacco. She reports that she does not drink alcohol and does not use drugs. ? ? ?Allergies: No Known Allergies ? ? ?Medications Prior to Admission  ?Medication Sig Dispense Refill  ? aspirin EC 81 MG tablet Take 81 mg by mouth daily.    ? diphenhydrAMINE (BENADRYL) 25 mg capsule Take 25 mg by mouth at bedtime.    ? ferrous sulfate 325 (65 FE) MG tablet Take 1 tablet (325 mg total) by mouth daily with breakfast. 30 tablet 0  ? guaiFENesin (MUCINEX) 600 MG 12 hr tablet Take 600 mg by mouth daily.    ? Potassium 99 MG TABS Take 99 mg by mouth daily.    ? amLODipine (NORVASC) 10 MG tablet Take 1 tablet (10 mg total) by mouth daily. (Patient not taking: Reported on 05/21/2021) 30 tablet 0  ? busPIRone (BUSPAR) 5 MG tablet Take 1.5 tablets (7.5 mg total) by mouth 2 (two) times daily. (Patient not  taking: Reported on 05/21/2021) 60 tablet 0  ? hydrALAZINE (APRESOLINE) 50 MG tablet Take 1 tablet (50 mg total) by mouth every 8 (eight) hours. (Patient not taking: Reported on 05/21/2021) 90 tablet 0  ? hydrochlorothiazide (MICROZIDE) 12.5 MG capsule Take 1 capsule (12.5 mg total) by mouth daily. (Patient not taking: Reported on 05/21/2021) 30 capsule 0  ? metoprolol (LOPRESSOR) 100 MG tablet TAKE ONE TABLET BY MOUTH TWICE DAILY (Patient not taking: Reported on 05/21/2021) 60 tablet 0  ? potassium chloride SA (K-DUR,KLOR-CON) 20 MEQ  tablet Take 1 tablet (20 mEq total) by mouth daily. (Patient not taking: Reported on 05/21/2021) 30 tablet 0  ? ? ? ? ?Home: ?Home Living ?Family/patient expects to be discharged to:: Private residence ?Living Arrangements: Spouse/significant other, Children (adult son) ?Available Help at Discharge: Family, Available 24 hours/day (son and spouse can provide 24/7) ?Type of Home: House ?Home Access: Level entry, Ramped entrance ?Home Layout: One level ?Bathroom Shower/Tub: Gaffer, Door ?Bathroom Toilet: Handicapped height ?Bathroom Accessibility: Yes ?Home Equipment: Conservation officer, nature (2 wheels), BSC/3in1, Shower seat, Grab bars - toilet, Grab bars - tub/shower, Hand held shower head, Wheelchair - manual ? Lives With: Spouse, Son ?  ?Functional History: ?Prior Function ?Prior Level of Function : Needs assist ?Physical Assist : ADLs (physical) ?Mobility Comments: uses RW ?ADLs Comments: family helps with IADL as needed ? ?Functional Status:  ?Mobility: ?Bed Mobility ?Overal bed mobility: Needs Assistance ?Bed Mobility: Rolling, Sidelying to Sit ?Rolling: Mod assist ?Sidelying to sit: Mod assist, +2 for physical assistance ?General bed mobility comments: required assistance to scoot to EOB ?Transfers ?Overall transfer level: Needs assistance ?Equipment used: Ambulation equipment used ?Transfers: Bed to chair/wheelchair/BSC, Sit to/from Stand ?Sit to Stand: Max assist, +2 physical assistance ?Bed to/from chair/wheelchair/BSC transfer type:: Via Lift equipment ?Squat pivot transfers: +2 physical assistance, Max assist ?Transfer via Lift Equipment: Stedy ?General transfer comment: mod to max assist +2 to stand from EOB and into stedy with patient demonstrating pushing with RUE ?Ambulation/Gait ?General Gait Details: NT ?  ? ?ADL: ?ADL ?Overall ADL's : Needs assistance/impaired ?Eating/Feeding: Minimal assistance ?Grooming: Moderate assistance ?Upper Body Bathing: Moderate assistance ?Lower Body Bathing: Maximal  assistance, Sit to/from stand ?Lower Body Bathing Details (indicate cue type and reason): peri area cleaning performe while standing with max assist +2 for standing ?Upper Body Dressing : Maximal assistance ?Lower Body Dressing: Total assistance ?Toileting- Clothing Manipulation and Hygiene: Total assistance ?Functional mobility during ADLs: +2 for physical assistance, Maximal assistance ?General ADL Comments: focused on sitting and standing balance ? ?Cognition: ?Cognition ?Overall Cognitive Status: Impaired/Different from baseline ?Orientation Level: Oriented X4 ?Year: 2023 ?Month: April ?Day of Week: Incorrect ?Memory: Impaired (recalled 4/5 items without cue, 1/5 with cue) ?Memory Impairment: Retrieval deficit ?Awareness: Appears intact ?Problem Solving: Impaired ?Problem Solving Impairment: Functional complex (pt needs cues to slow rate of speech and overarticulate) ?Safety/Judgment: Appears intact ?Comments: Pt states she does not want to repeat herself but needs max cues to use compensation strategies to improve intelligiblity. ?Cognition ?Arousal/Alertness: Awake/alert ?Behavior During Therapy: [facility] for tasks assessed/performed ?Overall Cognitive Status: Impaired/Different from baseline ?Area of Impairment: Attention, Safety/judgement, Awareness, Problem solving ?Current Attention Level: Sustained ?Safety/Judgement: Decreased awareness of safety ?Awareness: Emergent ?Problem Solving: Slow processing ?General Comments: pleasant and eager to participate with therapy ? ?Blood pressure (!) 160/97, pulse 96, temperature 98.3 ?F (36.8 ?C), resp. rate 18, height 5\' 10"  (1.778 m), weight 111.4 kg, SpO2 97 %. ?Physical Exam ?Vitals and nursing note reviewed.  ?Constitutional:   ?  Appearance: Normal appearance.  ?Neurological:  ?   Mental Status: She is alert.  ?   Sensory: Sensory deficit present.  ?   Motor: Weakness (mild left sided weakness after therapy in 2017--worse now) present.  ?   Comments: Left facial  paresis with severe dysarthria with low voice volume--tends to keep mouth open. 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     ? ? ?Results for orders placed or performed during the hospital encounter of 0

## 2021-05-23 NOTE — Progress Notes (Signed)
Patients diastolic consistently running out of parameters. PRN medication given twice. Patient expressed concern and anxiety related to high blood pressure. MD notified. Awaiting orders.  ?

## 2021-05-23 NOTE — TOC Initial Note (Signed)
Transition of Care (TOC) - Initial/Assessment Note  ? ? ?Patient Details  ?Name: Gwendolyn Morales ?MRN: [id number] ?Date of Birth: November 28, 1965 ? ?Transition of Care (TOC) CM/SW Contact:    ?Pollie Friar, RN ?Phone Number: ?05/23/2021, 2:09 PM ? ?Clinical Narrative:                 ?Patient is from home with spouse.  Pt to have TEE tomorrow. Recommendations are for CIR who has started insurance auth.  ?TOC following. ? ?Expected Discharge Plan: [place] ?Barriers to Discharge: Continued Medical Work up ? ? ?Patient Goals and CMS Choice ?  ?CMS Medicare.gov Compare Post Acute Care list provided to:: Patient ?Choice offered to / list presented to : Patient ? ?Expected Discharge Plan and Services ?Expected Discharge Plan: [place] ?  ?Discharge Planning Services: CM Consult ?Post Acute Care Choice: IP Rehab ?Living arrangements for the past 2 months: [place] ?                ?  ?  ?  ?  ?  ?  ?  ?  ?  ?  ? ?Prior Living Arrangements/Services ?Living arrangements for the past 2 months: [place] ?Lives with:: Spouse ?Patient language and need for interpreter reviewed:: Yes ?       ?Need for Family Participation in Patient Care: Yes (Comment) ?  ?  ?Criminal Activity/Legal Involvement Pertinent to Current Situation/Hospitalization: No - Comment as needed ? ?Activities of Daily Living ?Home Assistive Devices/Equipment: Gilford Rile (specify type), Wheelchair, Cane (specify quad or straight), Shower chair without back, Grab bars in shower, Raised toilet seat with rails ?ADL Screening (condition at time of admission) ?Patient's cognitive ability adequate to safely complete daily activities?: Yes ?Is the patient deaf or have difficulty hearing?: No ?Does the patient have difficulty seeing, even when wearing glasses/contacts?: No ?Does the patient have difficulty concentrating, remembering, or making decisions?: No ?Patient able to express need for assistance with ADLs?: Yes (voice is very soft.   Keeps cellphone to get help if needed) ?Does the patient have difficulty dressing or bathing?: Yes ?Independently performs ADLs?: No ?Communication: Independent ?Dressing (OT): Needs assistance ?Is this a change from baseline?: Pre-admission baseline ?Grooming: Needs assistance ?Is this a change from baseline?: Pre-admission baseline ?Feeding: Independent ?Bathing: Needs assistance ?Is this a change from baseline?: Pre-admission baseline ?Toileting: Needs assistance ?Is this a change from baseline?: Pre-admission baseline ?In/Out Bed: Needs assistance ?Is this a change from baseline?: Pre-admission baseline ?Walks in Home: Needs assistance ?Is this a change from baseline?: Pre-admission baseline ?Does the patient have difficulty walking or climbing stairs?: No ?Weakness of Legs: Left ?Weakness of Arms/Hands: Left ? ?Permission Sought/Granted ?  ?  ?   ?   ?   ?   ? ?Emotional Assessment ?  ?  ?  ?  ?  ?Psych Involvement: No (comment) ? ?Admission diagnosis:  CVA (cerebral vascular accident) ([place]) [I63.9] ?Patient Active Problem List  ? Diagnosis Date Noted  ? E-coli UTI 05/23/2021  ? CVA (cerebral vascular accident) ([place]) 05/21/2021  ? CKD (chronic kidney disease) stage 3, GFR 30-59 ml/min (HCC) 05/21/2021  ? History of CVA with residual deficit 05/21/2021  ? History of CVA with residual right-sided weakness gait disturbance, post-stroke 08/28/2015  ? Hemiparesis as late effect of cerebrovascular accident (CVA) ([place]) 08/28/2015  ? Benign essential HTN   ? Adjustment disorder with depressed mood   ? Cerebral parenchymal hemorrhage ([place]) 07/24/2015  ? Right-sided nontraumatic  intracerebral hemorrhage of brainstem ([place])   ? Elevated bilirubin   ? Adjustment disorder with mixed anxiety and depressed mood   ? Diastolic dysfunction   ? Obesity (BMI 30-39.9)   ? H/O noncompliance with medical treatment, presenting hazards to health   ? Tachypnea   ? Acute blood loss anemia   ? Dysphagia, post-stroke   ? HTN (hypertension)    ? Productive cough   ? Tracheostomy status ([place])   ? Nontraumatic cortical hemorrhage of left cerebral hemisphere [facility])   ? Hypokalemia   ? Azotemia   ? Acute respiratory failure ([place])   ? Acute respiratory failure with hypoxemia (HCC)   ? Cytotoxic brain edema ([place]) 07/13/2015  ? Hypertensive emergency 07/13/2015  ? ICH (intracerebral hemorrhage) ([place]) 07/12/2015  ? ?PCP:  [name], MD ?Pharmacy:   ?[place], [name] ?[facility] [place] [address] ?Phone: [phone] Fax: [phone] ? ? ? ? ?Social Determinants of Health (SDOH) Interventions ?  ? ?Readmission Risk Interventions ?   ? View : No data to display.  ?  ?  ?  ? ? ? ?

## 2021-05-23 NOTE — Plan of Care (Signed)
Pt is alert oriented x 4. Slurred speech but able to fully communicate needs. Pt has weakness to left side but pt is able to move it against gravity. Pt denies pain. Pt did have BP diastolic greater than 110, prn hydralazine given per order, effective. Pt turned in bed. Pt has purewick in place for urine output. Pt resting no distress noted.  ? ? ?Problem: Education: ?Goal: Knowledge of secondary prevention will improve (SELECT ALL) ?Outcome: Progressing ?Goal: Knowledge of patient specific risk factors will improve (INDIVIDUALIZE FOR PATIENT) ?Outcome: Progressing ?Goal: Individualized Educational Video(s) ?Outcome: Progressing ?  ?Problem: Coping: ?Goal: Will verbalize positive feelings about self ?Outcome: Progressing ?Goal: Will identify appropriate support needs ?Outcome: Progressing ?  ?Problem: Self-Care: ?Goal: Verbalization of feelings and concerns over difficulty with self-care will improve ?Outcome: Progressing ?Goal: Ability to communicate needs accurately will improve ?Outcome: Progressing ?  ?Problem: Nutrition: ?Goal: Risk of aspiration will decrease ?Outcome: Progressing ?  ?Problem: Education: ?Goal: Knowledge of General Education information will improve ?Description: Including pain rating scale, medication(s)/side effects and non-pharmacologic comfort measures ?Outcome: Progressing ?  ?Problem: Health Behavior/Discharge Planning: ?Goal: Ability to manage health-related needs will improve ?Outcome: Progressing ?  ?Problem: Clinical Measurements: ?Goal: Ability to maintain clinical measurements within normal limits will improve ?Outcome: Progressing ?Goal: Will remain free from infection ?Outcome: Progressing ?Goal: Diagnostic test results will improve ?Outcome: Progressing ?Goal: Respiratory complications will improve ?Outcome: Progressing ?Goal: Cardiovascular complication will be avoided ?Outcome: Progressing ?  ?Problem: Activity: ?Goal: Risk for activity intolerance will decrease ?Outcome:  Progressing ?  ?Problem: Elimination: ?Goal: Will not experience complications related to bowel motility ?Outcome: Progressing ?Goal: Will not experience complications related to urinary retention ?Outcome: Progressing ?  ?Problem: Safety: ?Goal: Ability to remain free from injury will improve ?Outcome: Progressing ?  ?Problem: Skin Integrity: ?Goal: Risk for impaired skin integrity will decrease ?Outcome: Progressing ?  ?

## 2021-05-23 NOTE — Progress Notes (Signed)
?PROGRESS NOTE ? ? ? ?Gwendolyn Morales  [id number] DOB: June 04, 1965 DOA: 05/21/2021 ?PCP: [name], MD  ? ? ?Chief Complaint  ?Patient presents with  ? Code Stroke  ? ? 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  Patient also noted to have some dysarthric speech that had worsened.  Normally patient noted to ambulate with the use of a walker at baseline since her prior stroke.  Patient brought to the ED as a code stroke.  On presentation patient noted to have a blood pressure of 204/148 with normal O2.  CT head done with signs of subacute right PCA stroke and chronic right frontal stroke.  Patient noted to not be a candidate for thrombolytics due to prior history of intracranial hemorrhage.  CT angiogram head and neck done was negative for LVO.  MRI significant for acute small vessel deep white matter infarcts adjacent to the posterior body of the right lateral ventricle, anterior body of the right lateral ventricle, posterior body of the left lateral ventricle and related to old infarct of the right posterior medial temporal lobe and occipital lobe.  Urinalysis also concerning for UTI.  Patient admitted for stroke work-up.  Neurology consulted and following  ? ? ?Assessment & Plan: ? Principal Problem: ?  CVA (cerebral vascular accident) ([place]) ?Active Problems: ?  Hypertensive emergency ?  Hypokalemia ?  Productive cough ?  Obesity (BMI 30-39.9) ?  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  CKD (chronic kidney disease) stage 3, GFR 30-59 ml/min (HCC) ?  History of CVA with residual deficit ?  E-coli UTI ? ? ? ?Assessment and Plan: ?* CVA (cerebral vascular accident) ([place]) ?Patient present with complaints of left-sided weakness upon awakening the morning of admission.   ?-CT with signs of a subacute  right PCA stroke with chronic right frontal stroke.  Patient was not a thrombolytic candidate due to prior history of intracranial hemorrhage.  ?- CTA of the head and neck did not note any large vessel occlusion. ?-Patient seen in consultation by neurology who recommended admission for stroke work-up. ?-MRI brain done with chronic small vessel disease, acute small vessel deep white matter infarctions adjacent to the posterior body of the right lateral ventricle, anterior body of the right lateral ventricle, posterior body of the left lateral ventricle, late subacute infarct of the right posterior medial temporal lobe and occipital lobe. ?- ?- echocardiogram -limited study due to patient body habitus.  EF noted to be 60 to 65% with indeterminate diastolic parameters.  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  Message sent to [name] in order to help arrange TEE which.  Has been arranged for tomorrow 05/24/2021. ?-TCD bubble study done this morning noted to be positive per neurology and as such lower extremity Dopplers was ordered, which was negative for DVT. ?-LDL at 118. ?-Hemoglobin A1c 5.4. ?-PT/OT/ST. ?-Permissive hypertension for initial 24 hours and slowly normalize blood pressure in the next 5 to 7 days. ?-Increase Norvasc to 5 mg daily, start Lopressor 25 mg twice daily. ?-Continue statin. ?-Patient being followed by the stroke team who recommended aspirin and Plavix x3 weeks, and then aspirin alone. ?-Per neurology concern for sleep apnea considering participation in sleep  smog stroke prevention study which is being managed by neurology. ?-Appreciate neurology consultative services, will follow-up any further recommendations ? ?Hypertensive emergency ?-On admission blood pressure was elevated up to 204/148.  Patient had not been taking any of  her home blood pressure medications since possibly August 2022. ?- Previous blood pressure regimen included amlodipine 10 mg daily, hydralazine 50 mg 3 times daily, hydralazine 12.5 mg daily, and metoprolol 100 mg twice daily. ?-Allow for permissive hypertension given acute stroke and slowly normalize blood pressure in the next 5 to 7 days secondary to acute CVA. ?-Increase Norvasc to 5 mg daily, start Lopressor 25 mg twice daily. ?-Hydralazine IV as needed for systolic blood pressures greater than [id number] or diastolic blood pressure greater than 110 ? ?Hypokalemia ?Acute. ?  Initial potassium 3.4. ?-Repleted, potassium at 3.6.   ?-Repeat labs in the morning.  ? ?E-coli UTI ?- Urine cultures with > 100,000 colonies of E. Coli.  Sensitivities pending. ?-Patient started on IV Rocephin 05/22/2021 which we will continue pending sensitivities and finalization of culture results. ? ?CKD (chronic kidney disease) stage 3, GFR 30-59 ml/min (HCC) ? Creatinine 1.21 with BUN 10.  Baseline creatinine previously had been around 0.9 back in 2019 per records on Care Everywhere.  This may constitute an acute kidney injury. ?-Urinalysis concerning for UTI with large leukocytes, positive nitrite, many bacteria, WBC > 50.  Protein of 100. ?-Renal function trending down with gentle hydration and currently at 1.08. ?-IV fluids have been discontinued. ? 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 ?-PT/OT. ? ?Obesity (BMI 30-39.9) ?BMI initially calculated at 37.3 kg/m?. ?-Lifestyle modification. ?-Outpatient follow-up with PCP. ? ?Productive cough ?Acute on chronic.  Patient has reportedly had this intermittent cough for which she takes Mucinex at home.  Lung sounds on exam appear to be clear. ?-Incentive spirometry ?-Continue Mucinex ?-Chest x-ray which noted  minimal bibasilar atelectasis  ?-Incentive spirometry, flutter valve. ?-On IV antibiotics secondary to UTI. ? ? ? ? ?  ? ? ?DVT prophylaxis: Lovenox ?Code Status: Full ?Family Communication: Updated patient and son at bedside. ?Disposition: CIR hopefully ? ?Status is: Inpatient ?Remains inpatient appropriate because: Severity of illness. ?  ?Consultants:  ?Neurology: Dr.Khaliqdina 05/21/2021 ? ?Procedures:  ?CT head 05/21/2021 ?CT angiogram head and neck 05/21/2021 ?2D echo with bubble study 05/21/2021 ?MRI brain 05/21/2021 ?2D echo with bubble study 05/23/2021 ?TEE scheduled for 05/24/2021 ?Lower extremity Dopplers 05/23/2021 pending ? ? ?Antimicrobials:  ?IV Rocephin 05/22/2021>>>> ? ? ?Subjective: ?Patient sitting up in chair, getting ready to go for 2D echo with bubble study.  No chest pain.  No shortness of breath.  Son at bedside.  Patient with some complaints of dysuria. ? ?Objective: ?Vitals:  ? 05/23/21 0356 05/23/21 0800 05/23/21 1109 05/23/21 1557  ?BP: (!) 169/98 (!) 182/137 (!) 184/116 (!) 194/139  ?Pulse: (!) 105 (!) 108 (!) 106 (!) 105  ?Resp: 20 18 18 18   ?Temp: 98.1 ?F (36.7 ?C) 98.2 ?F (36.8 ?C) 98.2 ?F (36.8 ?C) 98.2 ?F (36.8 ?C)  ?TempSrc: Oral Oral    ?SpO2: 97% 95% 96% 96%  ?Weight:      ? ? ?Intake/Output Summary (Last 24 hours) at 05/23/2021 1648 ?Last data filed at 05/22/2021 2154 ?Gross per 24 hour  ?Intake 240 ml  ?Output --  ?Net 240 ml  ? ?Filed Weights  ? 05/21/21 0800  ?  Weight: 111.4 kg  ? ? ?Examination: ? ?General exam: NAD. ?Respiratory system: CTA B.  No wheezes, no crackles, no rhonchi.  Fair air movement.  Normal respiratory effort.   ?Cardiovascular system: Regular rate rhythm no murmurs rubs or gallops.  No JVD.  No lower extremity edema. ?Gastrointestinal system: Abdomen is soft, nontender, nondistended, positive bowel sounds.  No rebound.  No guarding.  ?Central nervous system: Alert and oriented.  Dysarthric speech.  5/5 right upper extremity strength.  3-4/5 right lower extremity  strength.  Left hemiparesis with 2-3/5 left upper extremity strength.  2/5 left lower extremity strength.  Gait not tested secondary to safety.  Sensation intact.  Finger-to-nose intact on the right.  Unable to

## 2021-05-23 NOTE — Assessment & Plan Note (Addendum)
-   Urine cultures with > 100,000 colonies of E. Coli which was pansensitive. ?-Patient initially placed on IV Rocephin pending urine cultures and once cultures has resulted patient was subsequently transition to oral cephalexin and completed a 5-day course of antibiotic treatment.   ?

## 2021-05-23 NOTE — PMR Pre-admission (Signed)
PMR Admission Coordinator Pre-Admission Assessment ? ?[name] is an 56 y.o., female ?MRN: [id number] ?DOB: 1965/12/22 ?Height: 5\' 10"  (177.8 cm) ?Weight: 111.4 kg ? ?Insurance Information ?HMO:     PPO:      PCP:      IPA:      80/20:      OTHER:  ?PRIMARY: [place] Medicaid [employer]      Policy#: [id number]      Subscriber: pt ?CM Name: Mickel Baas customer service rep  at [facility] 4/17    Phone#: [phone]      Fax#: [phone] ?Pre-Cert#: [id number]      Employer:  ?Benefits:  Phone #: [phone]   approved on 4/15 for 7 days  Name: 4/12 ?Eff. Date: 08/11/2019     Deduct: none      Out of Pocket Max: none      Life Max: none ?CIR: 100% per medicaid guidelines      SNF: per medicaid ?Outpatient: per medicaid     Co-Pay:  ?Home Health: per medicaid      Co-Pay:  ?DME: per medicaid     Co-Pay:  ?Providers: in network ? ?SECONDARY: none ? ?Financial Counselor:       Phone#:  ? ?The ?Data Collection Information Summary? for patients in Inpatient Rehabilitation Facilities with attached ?Privacy Act [place] Records? was provided and verbally reviewed with: N/A ? ?Emergency Contact Information ?Contact Information   ? ? Name Relation Home Work Mobile  ? Cotta,Robert Spouse   [phone]  ? [place], Eldred Son   [phone]  ? ?  ? ?Current Medical History  ?Patient Admitting Diagnosis: CVA ? ?History of Present Illness: 56 year old female with history of HTN , CVA and ICH in 2017 with residual right sided weakness who presented on 05/21/2021 with left sided weakness. Does report off her BP meds since August of last year after having difficulty scheduling an appointment with her PCP. She continued to take her Diona Fanti. On presentation her BP was 204/148.  ? ?CT head done with signs of subacute right PCA stroke and chronic right frontal stroke.  Patient noted to not be a candidate for thrombolytics due to prior history of intracranial hemorrhage.  CT angiogram head and neck done was negative for  LVO.  MRI significant for acute small vessel deep white matter infarcts adjacent to the posterior body of the right lateral ventricle, anterior body of the right lateral ventricle, posterior body of the left lateral ventricle and related to old infarct of the right posterior medial temporal lobe and occipital lobe.  Urinalysis also concerning for UTI. Placed on IV Rocephin pending final culture results. ? ?Neurology consulted. Echo limited due to body habitus. ED 60 to 65%. . Normal interatrial septum with no evidence of shunt by color flow doppler or agitated saline contrast. No atrial stenosis.  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 TCD bubble study positive per Neurology , LE dopplers negative for DVTs.  ? ?Started Norvasc and statin. Plan for [place] and Plavix for 3 weeks and then [place] alone. Lovenox for DVT prophylaxis.  ? ?Complete NIHSS TOTAL: 9 ? ?Patient[facility] from [facility] has been reviewed by the rehabilitation admission coordinator and physician. ? ?Past Medical History  ?Past Medical History:  ?Diagnosis Date  ? Hypertension   ? Stroke [facility]) 07/2015  ? ICH with residual hemiparesis  ? ?Has the patient had major surgery during 100 days prior to admission? No ? ?Family History   ?  family history includes Diabetes in her mother; Hypertension in her sister. ? ?Current Medications ? ?Current Facility-Administered Medications:  ?  acetaminophen (TYLENOL) tablet 650 mg, 650 mg, Oral, Q4H PRN, 650 mg at 05/26/21 1325 **OR** acetaminophen (TYLENOL) 160 MG/5ML solution 650 mg, 650 mg, Per Tube, Q4H PRN **OR** acetaminophen (TYLENOL) suppository 650 mg, 650 mg, Rectal, Q4H PRN, [name], [name], MD ?  amLODipine (NORVASC) tablet 10 mg, 10 mg, Oral, Daily, [name], MD, 10 mg at 05/27/21 [id number] ?  aspirin EC tablet 81 mg, 81 mg, Oral, Daily, [name], [name], MD, 81 mg at 05/27/21 0806 ?  atorvastatin (LIPITOR) tablet 80 mg, 80 mg, Oral, Daily, Shafer, Devon, NP, 80  mg at 05/27/21 0806 ?  clopidogrel (PLAVIX) tablet 75 mg, 75 mg, Oral, Daily, Shafer, Devon, NP, 75 mg at 05/27/21 0806 ?  enoxaparin (LOVENOX) injection 40 mg, 40 mg, Subcutaneous, Q24H, [name], [name], MD, 40 mg at 05/26/21 0917 ?  guaiFENesin (MUCINEX) 12 hr tablet 1,200 mg, 1,200 mg, Oral, BID, [name], MD, 1,200 mg at 05/27/21 [id number] ?  hydrALAZINE (APRESOLINE) injection 10 mg, 10 mg, Intravenous, Q4H PRN, Fuller Plan A, MD, 10 mg at 05/26/21 0804 ?  hydrALAZINE (APRESOLINE) tablet 25 mg, 25 mg, Oral, Q8H, [name], MD, 25 mg at 05/27/21 [id number] ?  hydrochlorothiazide (HYDRODIURIL) tablet 12.5 mg, 12.5 mg, Oral, Daily, [name], MD, 12.5 mg at 05/27/21 [id number] ?  hydrOXYzine (ATARAX) tablet 25 mg, 25 mg, Oral, TID PRN, [name], MD, 25 mg at 05/25/21 2100 ?  metoprolol tartrate (LOPRESSOR) tablet 100 mg, 100 mg, Oral, BID, [name], MD, 100 mg at 05/27/21 [id number] ?  ondansetron (ZOFRAN) injection 4 mg, 4 mg, Intravenous, Q6H PRN, [name], MD, 4 mg at 05/22/21 1155 ?  phenol (CHLORASEPTIC) mouth spray 1 spray, 1 spray, Mouth/Throat, PRN, [name], MD, 1 spray at 05/26/21 1326 ?  potassium chloride SA (KLOR-CON M) CR tablet 20 mEq, 20 mEq, Oral, Daily, [name], MD, 20 mEq at 05/27/21 [id number] ?  senna-docusate (Senokot-S) tablet 1 tablet, 1 tablet, Oral, QHS PRN, [name], [name], MD ?  sodium chloride flush (NS) 0.9 % injection 3 mL, 3 mL, Intravenous, Once, Kommor, Madison, MD ? ?Patients Current Diet:  ?Diet Order   ? ?       ?  Diet Heart Room service appropriate? Yes; Fluid consistency: Thin  Diet effective now       ?  ? ?  ?  ? ?  ? ?Precautions / Restrictions ?Precautions ?Precautions: Fall ?Restrictions ?Weight Bearing Restrictions: No  ? ?Has the patient had 2 or more falls or a fall with injury in the past year? No ? ?Prior Activity Level ?Limited Community (1-2x/wk): Mod I with RW ? ?Prior Functional Level ?Self Care: Did the  patient need help bathing, dressing, using the toilet or eating? Needed some help ? ?Indoor Mobility: Did the patient need assistance with walking from room to room (with or without device)? Independent ? ?Stairs: Did the patient need assistance with internal or external stairs (with or without device)? Needed some help ? ?Functional Cognition: Did the patient need help planning regular tasks such as shopping or remembering to take medications? Needed some help ? ?Patient Information ?Are you of Hispanic, Latino/a,or Spanish origin?: A. No, not of Hispanic, Latino/a, or Spanish origin ?What is your race?: A. White ?Do you need or want an interpreter to communicate with a doctor or  health care staff?: 0. No ? ?Patient's Response To:  ?Health Literacy and Transportation ?Is the patient able to respond to health literacy and transportation needs?: Yes ?Health Literacy - How often do you need to have someone help you when you read instructions, pamphlets, or other written material from your doctor or pharmacy?: Never ?In the past 12 months, has lack of transportation kept you from medical appointments or from getting medications?: No ?In the past 12 months, has lack of transportation kept you from meetings, work, or from getting things needed for daily living?: No ? ?Home Assistive Devices / Equipment ?Home Assistive Devices/Equipment: Gilford Rile (specify type), Wheelchair, Cane (specify quad or straight), Shower chair without back, Grab bars in shower, Raised toilet seat with rails ?Home Equipment: Conservation officer, nature (2 wheels), BSC/3in1, Shower seat, Grab bars - toilet, Grab bars - tub/shower, Hand held shower head, Wheelchair - manual ? ?Prior Device Use: Indicate devices/aids used by the patient prior to current illness, exacerbation or injury? Walker ? ?Current Functional Level ?Cognition ? Overall Cognitive Status: Impaired/Different from baseline ?Current Attention Level: Sustained ?Orientation Level: Oriented  X4 ?Safety/Judgement: Decreased awareness of safety ?General Comments: pleasant and eager to participate with therapy ?Memory: Impaired (recalled 4/5 items without cue, 1/5 with cue) ?Memory Impairment: Retrieval de

## 2021-05-23 NOTE — Plan of Care (Signed)
?  Problem: Education: ?Goal: Knowledge of secondary prevention will improve (SELECT ALL) ?Outcome: Progressing ?Goal: Knowledge of patient specific risk factors will improve (INDIVIDUALIZE FOR PATIENT) ?Outcome: Progressing ?Goal: Individualized Educational Video(s) ?Outcome: Progressing ?  ?Problem: Coping: ?Goal: Will verbalize positive feelings about self ?Outcome: Progressing ?Goal: Will identify appropriate support needs ?Outcome: Progressing ?  ?Problem: Self-Care: ?Goal: Verbalization of feelings and concerns over difficulty with self-care will improve ?Outcome: Progressing ?Goal: Ability to communicate needs accurately will improve ?Outcome: Progressing ?  ?Problem: Nutrition: ?Goal: Risk of aspiration will decrease ?Outcome: Progressing ?  ?Problem: Education: ?Goal: Knowledge of General Education information will improve ?Description: Including pain rating scale, medication(s)/side effects and non-pharmacologic comfort measures ?Outcome: Progressing ?  ?Problem: Health Behavior/Discharge Planning: ?Goal: Ability to manage health-related needs will improve ?Outcome: Progressing ?  ?Problem: Clinical Measurements: ?Goal: Ability to maintain clinical measurements within normal limits will improve ?Outcome: Progressing ?Goal: Will remain free from infection ?Outcome: Progressing ?Goal: Diagnostic test results will improve ?Outcome: Progressing ?Goal: Respiratory complications will improve ?Outcome: Progressing ?Goal: Cardiovascular complication will be avoided ?Outcome: Progressing ?  ?Problem: Activity: ?Goal: Risk for activity intolerance will decrease ?Outcome: Progressing ?  ?Problem: Coping: ?Goal: Level of anxiety will decrease ?Outcome: Progressing ?  ?Problem: Elimination: ?Goal: Will not experience complications related to bowel motility ?Outcome: Progressing ?Goal: Will not experience complications related to urinary retention ?Outcome: Progressing ?  ?Problem: Safety: ?Goal: Ability to remain free  from injury will improve ?Outcome: Progressing ?  ?Problem: Skin Integrity: ?Goal: Risk for impaired skin integrity will decrease ?Outcome: Progressing ?  ?

## 2021-05-23 NOTE — Progress Notes (Signed)
Physical Therapy Treatment ?Patient Details ?Name: Gwendolyn Morales ?MRN: [id number] ?DOB: Sep 10, 1965 ?Today's Date: 05/23/2021 ? ? ?History of Present Illness 56 y.o. female presents with complaints of left-sided weakness. MRI + Advanced chronic small-vessel ischemic changes; acute small-vessel deep white matter infarctions adjacent to the posterior body of the right lateral ventricle, the anterior body of the right lateral ventricle in the posterior body of the left  lateral ventricle; late subacutte to old infarct of the R posteromedial temporal and occipital lobes. 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 ?  ?PT Comments  ? ? Pt was agreeable to participating and getting up OOB.  Emphasis on transitions with rolling and coming up via left side, work on scooting to EOB, activities to build confidence with balance at EOB, sit to stands x2 at EOB and transfer to the recliner. ?   ?Recommendations for follow up therapy are one component of a multi-disciplinary discharge planning process, led by the attending physician.  Recommendations may be updated based on patient status, additional functional criteria and insurance authorization. ? ?Follow Up Recommendations ? Acute inpatient rehab (3hours/day) ?  ?  ?Assistance Recommended at Discharge Frequent or constant Supervision/Assistance  ?Patient can return home with the following A little help with walking and/or transfers;A little help with bathing/dressing/bathroom;Assistance with cooking/housework;Assist for transportation;Help with stairs or ramp for entrance ?  ?Equipment Recommendations ? Other (comment) (TBD)  ?  ?Recommendations for Other Services Rehab consult ? ? ?  ?Precautions / Restrictions Precautions ?Precautions: Fall  ?  ? ?Mobility ? Bed Mobility ?Overal bed mobility: Needs Assistance ?Bed Mobility: Rolling, Sidelying to Sit ?Rolling: Mod assist ?Sidelying to sit: Mod assist, +2 for  physical assistance ?  ?  ?  ?General bed mobility comments: max to scoot to EOB, pt is anxious about falling in genral, so she doesn't give her all rocking forward to build momentum. ?  ? ?Transfers ?Overall transfer level: Needs assistance ?Equipment used:  Lysbeth Galas) ?Transfers: Sit to/from Stand ?Sit to Stand: Max assist, +2 physical assistance ?  ?  ?Squat pivot transfers: +2 physical assistance, Max assist ?  ?  ?General transfer comment: padding used to allow for more leverage to assist forward and up to a submaximal standing posture x 2 trials. ?  ? ?Ambulation/Gait ?  ?  ?  ?  ?  ?  ?  ?General Gait Details: NT ? ? ?Stairs ?  ?  ?  ?  ?  ? ? ?Wheelchair Mobility ?  ? ?Modified Rankin (Stroke Patients Only) ?Modified Rankin (Stroke Patients Only) ?Pre-Morbid Rankin Score: Moderate disability ?Modified Rankin: Severe disability ? ? ?  ?Balance Overall balance assessment: Needs assistance ?  ?Sitting balance-Leahy Scale: Fair ?Sitting balance - Comments: pt with weak w/shift, but able to balance well within BOS ?  ?Standing balance support: During functional activity, Bilateral upper extremity supported ?Standing balance-Leahy Scale: Poor ?Standing balance comment: posterior bias once up on her feet.  Bil LE blocked, bed pad used to impose more stability and help pt to attain a more upright posture. ?  ?  ?  ?  ?  ?  ?  ?  ?  ?  ?  ?  ? ?  ?Cognition Arousal/Alertness: Awake/alert ?Behavior During Therapy: [facility] for tasks assessed/performed ?Overall Cognitive Status: Impaired/Different from baseline ?Area of Impairment: Attention, Safety/judgement, Awareness, Problem solving ?  ?  ?  ?  ?  ?  ?  ?  ?  ?  Current Attention Level: Sustained ?  ?  ?  ?Awareness: Emergent ?Problem Solving: Slow processing ?  ?  ?  ? ?  ?Exercises Other Exercises ?Other Exercises: hip/knee flexion/ext bil with graded assist/resistance x10 reps. ?Other Exercises: bicep/tricep presses x10 with resistance to $ UE,  P/AA ROM to L hand and  elbow. ? ?  ?General Comments   ?  ?  ? ?Pertinent Vitals/Pain Pain Assessment ?Pain Assessment: Faces ?Faces Pain Scale: Hurts a little bit ?Pain Location: L arm , shoulder, traps. ?Pain Descriptors / Indicators: Sore ?Pain Intervention(s): Monitored during session  ? ? ?Home Living   ?  ?  ?  ?  ?  ?  ?  ?  ?  ?   ?  ?Prior Function    ?  ?  ?   ? ?PT Goals (current goals can now be found in the care plan section) Acute Rehab PT Goals ?PT Goal Formulation: With patient ?Time For Goal Achievement: 06/05/21 ?Potential to Achieve Goals: Fair ?Progress towards PT goals: Progressing toward goals ? ?  ?Frequency ? ? ? Min 4X/week ? ? ? ?  ?PT Plan Current plan remains appropriate  ? ? ?Co-evaluation   ?  ?  ?  ?  ? ?  ?AM-PAC PT "6 Clicks" Mobility   ?Outcome Measure ? Help needed turning from your back to your side while in a flat bed without using bedrails?: A Lot ?Help needed moving from lying on your back to sitting on the side of a flat bed without using bedrails?: A Lot ?Help needed moving to and from a bed to a chair (including a wheelchair)?: Total ?Help needed standing up from a chair using your arms (e.g., wheelchair or bedside chair)?: Total ?Help needed to walk in hospital room?: Total ?Help needed climbing 3-5 steps with a railing? : Total ?6 Click Score: 8 ? ?  ?End of Session Equipment Utilized During Treatment: Gait belt ?Activity Tolerance: Patient tolerated treatment well ?Patient left: in chair;with call bell/phone within reach;with chair alarm set;Other (comment) ?Nurse Communication: Mobility status ?PT Visit Diagnosis: Muscle weakness (generalized) (M62.81);Hemiplegia and hemiparesis;Other symptoms and signs involving the nervous system (R29.898) ?Hemiplegia - Right/Left: Left ?Hemiplegia - dominant/non-dominant: Non-dominant ?Hemiplegia - caused by: Nontraumatic intracerebral hemorrhage ?  ? ? ?Time: 4142-3953 ?PT Time Calculation (min) (ACUTE ONLY): 35 min ? ?Charges:  $Therapeutic Activity: 8-22  mins ?$Neuromuscular Re-education: 8-22 mins          ?          ? ?05/23/2021 ? ?Jacinto Halim., PT ?Acute Rehabilitation Services ?[phone]  (pager) ?[phone]  (office) ? ? ?Gwendolyn Morales ?05/23/2021, 12:48 PM ? ?

## 2021-05-24 ENCOUNTER — Inpatient Hospital Stay (HOSPITAL_COMMUNITY): Payer: Medicaid Other | Admitting: Anesthesiology

## 2021-05-24 ENCOUNTER — Encounter (HOSPITAL_COMMUNITY)
Admission: EM | Disposition: A | Discharge status: Rehabilitation Facility | Payer: Self-pay | Source: Home / Self Care | Attending: Internal Medicine

## 2021-05-24 ENCOUNTER — Inpatient Hospital Stay (HOSPITAL_COMMUNITY): Payer: Medicaid Other

## 2021-05-24 ENCOUNTER — Encounter (HOSPITAL_COMMUNITY): Payer: Self-pay | Admitting: Internal Medicine

## 2021-05-24 DIAGNOSIS — I10 Essential (primary) hypertension: Secondary | ICD-10-CM

## 2021-05-24 DIAGNOSIS — I639 Cerebral infarction, unspecified: Secondary | ICD-10-CM | POA: Diagnosis not present

## 2021-05-24 DIAGNOSIS — N289 Disorder of kidney and ureter, unspecified: Secondary | ICD-10-CM

## 2021-05-24 DIAGNOSIS — I700 Atherosclerosis of aorta: Secondary | ICD-10-CM

## 2021-05-24 DIAGNOSIS — I3481 Nonrheumatic mitral (valve) annulus calcification: Secondary | ICD-10-CM

## 2021-05-24 DIAGNOSIS — I340 Nonrheumatic mitral (valve) insufficiency: Secondary | ICD-10-CM

## 2021-05-24 HISTORY — PX: BUBBLE STUDY: SHX6837

## 2021-05-24 HISTORY — PX: TEE WITHOUT CARDIOVERSION: SHX5443

## 2021-05-24 LAB — BASIC METABOLIC PANEL
Anion gap: 5 (ref 5–15)
BUN: 11 mg/dL (ref 6–20)
CO2: 25 mmol/L (ref 22–32)
Calcium: 9.6 mg/dL (ref 8.9–10.3)
Chloride: 109 mmol/L (ref 98–111)
Creatinine, Ser: 1.07 mg/dL — ABNORMAL HIGH (ref 0.44–1.00)
GFR, Estimated: >60 mL/min (ref >60)
Glucose, Bld: 103 mg/dL — ABNORMAL HIGH (ref 70–99)
Potassium: 3.7 mmol/L (ref 3.5–5.1)
Sodium: 139 mmol/L (ref 135–145)

## 2021-05-24 LAB — CBC
HCT: 44.7 % (ref 36.0–46.0)
Hemoglobin: 14.7 g/dL (ref 12.0–15.0)
MCH: 26.8 pg (ref 26.0–34.0)
MCHC: 32.9 g/dL (ref 30.0–36.0)
MCV: 81.4 fL (ref 80.0–100.0)
Platelets: 340 10*3/uL (ref 150–400)
RBC: 5.49 MIL/uL — ABNORMAL HIGH (ref 3.87–5.11)
RDW: 16.3 % — ABNORMAL HIGH (ref 11.5–15.5)
WBC: 9.7 10*3/uL (ref 4.0–10.5)
nRBC: 0 % (ref 0.0–0.2)

## 2021-05-24 LAB — PROTEIN S ACTIVITY: Protein S Activity: 47 % — ABNORMAL LOW (ref 63–140)

## 2021-05-24 LAB — HEXAGONAL PHASE PHOSPHOLIPID: Hexagonal Phase Phospholipid: 5 s (ref 0–11)

## 2021-05-24 LAB — PTT-LA MIX: PTT-LA Mix: 52.2 s — ABNORMAL HIGH (ref 0.0–40.5)

## 2021-05-24 LAB — PROTEIN S, TOTAL: Protein S Ag, Total: 143 % (ref 60–150)

## 2021-05-24 LAB — PROTEIN C ACTIVITY: Protein C Activity: 137 % (ref 73–180)

## 2021-05-24 LAB — CARDIOLIPIN ANTIBODIES, IGG, IGM, IGA
Anticardiolipin IgA: <9 APL U/mL (ref 0–11)
Anticardiolipin IgG: <9 GPL U/mL (ref 0–14)
Anticardiolipin IgM: 51 MPL U/mL — ABNORMAL HIGH (ref 0–12)

## 2021-05-24 LAB — MAGNESIUM: Magnesium: 2.1 mg/dL (ref 1.7–2.4)

## 2021-05-24 LAB — LUPUS ANTICOAGULANT PANEL
DRVVT: 46.5 s (ref 0.0–47.0)
PTT Lupus Anticoagulant: 59.4 s — ABNORMAL HIGH (ref 0.0–43.5)

## 2021-05-24 LAB — BETA-2-GLYCOPROTEIN I ABS, IGG/M/A
Beta-2 Glyco I IgG: <9 GPI IgG units (ref 0–20)
Beta-2-Glycoprotein I IgA: <9 GPI IgA units (ref 0–25)
Beta-2-Glycoprotein I IgM: 10 GPI IgM units (ref 0–32)

## 2021-05-24 SURGERY — ECHOCARDIOGRAM, TRANSESOPHAGEAL
Anesthesia: Monitor Anesthesia Care

## 2021-05-24 MED ORDER — PHENOL 1.4 % MT LIQD
1.0000 | OROMUCOSAL | Status: DC | PRN
  Administered 2021-05-25 – 2021-05-26 (×3): 1 via OROMUCOSAL
  Filled 2021-05-24: qty 177

## 2021-05-24 MED ORDER — SODIUM CHLORIDE 0.9 % IV SOLN
INTRAVENOUS | Status: DC
Start: 2021-05-24 — End: 2021-05-24

## 2021-05-24 MED ORDER — PHENYLEPHRINE HCL-NACL 20-0.9 MG/250ML-% IV SOLN
INTRAVENOUS | Status: DC | PRN
Start: 2021-05-24 — End: 2021-05-24
  Administered 2021-05-24: 15 ug/min via INTRAVENOUS

## 2021-05-24 MED ORDER — PROPOFOL 500 MG/50ML IV EMUL
INTRAVENOUS | Status: DC | PRN
Start: 2021-05-24 — End: 2021-05-24
  Administered 2021-05-24: 100 ug/kg/min via INTRAVENOUS

## 2021-05-24 MED ORDER — SODIUM CHLORIDE 0.9 % IV SOLN: INTRAVENOUS | Status: DC | PRN

## 2021-05-24 MED ORDER — LIDOCAINE 2% (20 MG/ML) 5 ML SYRINGE
INTRAMUSCULAR | Status: DC | PRN
  Administered 2021-05-24: 40 mg via INTRAVENOUS

## 2021-05-24 NOTE — Anesthesia Procedure Notes (Signed)
Procedure Name: [place] ?Date/Time: 05/24/2021 8:15 AM ?Performed by: Leonor Liv, CRNA ?Pre-anesthesia Checklist: Patient identified, Emergency Drugs available, Suction available, Patient being monitored and Timeout performed ?Patient Re-evaluated:Patient Re-evaluated prior to induction ?Oxygen Delivery Method: Nasal cannula ?Airway Equipment and Method: Bite block ?Placement Confirmation: positive ETCO2 ?Dental Injury: Teeth and Oropharynx as per pre-operative assessment  ? ? ? ? ?

## 2021-05-24 NOTE — Anesthesia Preprocedure Evaluation (Addendum)
Anesthesia Evaluation  ?Patient identified by MRN, date of birth, ID band ?Patient awake ? ? ? ?Reviewed: ?Allergy & Precautions, NPO status , Patient's Chart, lab work & pertinent test results, reviewed documented beta blocker date and time  ? ?Airway ?Mallampati: IV ? ?TM Distance: >3 FB ?Neck ROM: Full ? ? ? Dental ?no notable dental hx. ? ?  ?Pulmonary ?neg pulmonary ROS,  ?  ?Pulmonary exam normal ?breath sounds clear to auscultation ? ? ? ? ? ? Cardiovascular ?hypertension (poorly controlled, 167/111 in preop), Pt. on medications and Pt. on home beta blockers ?Normal cardiovascular exam+ Valvular Problems/Murmurs (mild MS, concern for mitral mass )  ?Rhythm:Regular Rate:Normal ? ?TTE 05/21/21: ??1. Left ventricular ejection fraction, by estimation, is 60 to 65%. The  ?left ventricle has normal function. The left ventricle has no regional  ?wall motion abnormalities. There is mild concentric left ventricular  ?hypertrophy. Left ventricular diastolic  ?parameters are indeterminate.  ??2. Right ventricular systolic function is normal. The right ventricular  ?size is normal. Tricuspid regurgitation signal is inadequate for assessing  ?PA pressure.  ??3. There is a calcified/fibrinous mobile mass on the posterior leaflet of  ?the mitral valve, which could be due to degenerative mitral annular  ?calcification. Recommend further testing with TEE for clarification if  ?clinically appropriate. No evidence of  ?mitral valve regurgitation. Mild mitral stenosis. The mean mitral valve  ?gradient is 4.0 mmHg.  ??4. The aortic valve is normal in structure. Aortic valve regurgitation is  ?not visualized. No aortic stenosis is present.  ??5. The inferior vena cava is normal in size with greater than 50%  ?respiratory variability, suggesting right atrial pressure of 3 mmHg.  ??6. Poor quality agitated saline study unable to exclude PFO.  ? ? ?"Permissive hypertension (OK if < 220/120) but  gradually normalize in 5-7 days" per neurology  ?  ?Neuro/Psych ?Residual L hemiparesis, dysphasia  ?CVA (2017), Residual Symptoms negative psych ROS  ? GI/Hepatic ?negative GI ROS, Neg liver ROS,   ?Endo/Other  ?negative endocrine ROS ? Renal/GU ?Renal InsufficiencyRenal diseaseCr 1.07  ?negative genitourinary ?  ?Musculoskeletal ?negative musculoskeletal ROS ?(+)  ? Abdominal ?  ?Peds ?negative pediatric ROS ?(+)  Hematology ?negative hematology ROS ?(+)   ?Anesthesia Other Findings ? ? Reproductive/Obstetrics ?negative OB ROS ? ?  ? ? ? ? ? ? ? ? ? ? ? ? ? ?  ?  ? ? ? ? ? ? ? ?Anesthesia Physical ?Anesthesia Plan ? ?ASA: 4 ? ?Anesthesia Plan: MAC  ? ?Post-op Pain Management:   ? ?Induction:  ? ?PONV Risk Score and Plan: 2 and Propofol infusion and TIVA ? ?Airway Management Planned: Natural Airway and Simple Face Mask ? ?Additional Equipment: None ? ?Intra-op Plan:  ? ?Post-operative Plan:  ? ?Informed Consent: I have reviewed the patients History and Physical, chart, labs and discussed the procedure including the risks, benefits and alternatives for the proposed anesthesia with the patient or authorized representative who has indicated his/her understanding and acceptance.  ? ? ? ? ? ?Plan Discussed with: CRNA ? ?Anesthesia Plan Comments: (Labeled difficult airway 2017 (time of CVA)- per critical care intubation "Rapid declines in minutes ?Stridor ?Cords swollen moderate but narrowing fast, mild secretions ?Could not pass 7.5,  ?Able to pass 7.0 easily")  ? ? ? ? ? ?Anesthesia Quick Evaluation ? ?

## 2021-05-24 NOTE — Progress Notes (Signed)
?PROGRESS NOTE ? ? ? ?Gwendolyn Morales  [id number] DOB: [birth year] DOA: 05/21/2021 ?PCP: [name], MD  ? ? ?Chief Complaint  ?Patient presents with  ? Code Stroke  ? ? 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  Patient also noted to have some dysarthric speech that had worsened.  Normally patient noted to ambulate with the use of a walker at baseline since her prior stroke.  Patient brought to the ED as a code stroke.  On presentation patient noted to have a blood pressure of 204/148 with normal O2.  CT head done with signs of subacute right PCA stroke and chronic right frontal stroke.  Patient noted to not be a candidate for thrombolytics due to prior history of intracranial hemorrhage.  CT angiogram head and neck done was negative for LVO.  MRI significant for acute small vessel deep white matter infarcts adjacent to the posterior body of the right lateral ventricle, anterior body of the right lateral ventricle, posterior body of the left lateral ventricle and related to old infarct of the right posterior medial temporal lobe and occipital lobe.  Urinalysis also concerning for UTI.  Patient admitted for stroke work-up.  Neurology consulted and following  ? ? ?Assessment & Plan: ? Principal Problem: ?  CVA (cerebral vascular accident) ([place]) ?Active Problems: ?  Hypertensive emergency ?  Hypokalemia ?  Productive cough ?  Obesity (BMI 30-39.9) ?  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  CKD (chronic kidney disease) stage 3, GFR 30-59 ml/min (HCC) ?  History of CVA with residual deficit ?  E-coli UTI ? ? ? ?Assessment and Plan: ?* CVA (cerebral vascular accident) ([place]) ?Patient presented with complaints of left-sided weakness upon awakening the morning of admission.   ?-CT with signs of a subacute  right PCA stroke with chronic right frontal stroke.  Patient was not a thrombolytic candidate due to prior history of intracranial hemorrhage.  ?- CTA of the head and neck did not note any large vessel occlusion. ?-Patient seen in consultation by neurology who recommended admission for stroke work-up. ?-MRI brain done with chronic small vessel disease, acute small vessel deep white matter infarctions adjacent to the posterior body of the right lateral ventricle, anterior body of the right lateral ventricle, posterior body of the left lateral ventricle, late subacute infarct of the right posterior medial temporal lobe and occipital lobe. ?- ?- echocardiogram -limited study due to patient body habitus.  EF noted to be 60 to 65% with indeterminate diastolic parameters.  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  Message sent to [name] in order to help arrange TEE which.  ?-TCD bubble study done 05/23/2021 noted to be positive per neurology and as such lower extremity Dopplers was ordered, which was negative for DVT. 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  Grade 2 atherosclerotic plaque in the thoracic and descending aorta.  No right-to-left shunt or PFO noted. ?-LDL at 118. ?-Hemoglobin A1c 5.4. ?-PT/OT/ST. ?-Permissive hypertension for initial 24 hours and slowly normalize blood pressure in the next 5 to 7 days. ?-Continue Norvasc to 5 mg daily,  Lopressor 25 mg twice daily. ?-Continue statin. ?-Patient being followed by the stroke team who recommended aspirin and Plavix x3 weeks, and then aspirin alone. ?-Per neurology concern for sleep apnea considering participation in sleep smog stroke prevention study which is  being managed by neurology. ?-Appreciate neurology consultative services, will follow-up any further recommendations ? ?Hypertensive emergency ?-On admission blood pressure was elevated up to 204/148.  Patient had not been taking any of her home blood pressure medications since possibly August 2022. ?- Previous blood pressure regimen included amlodipine 10 mg daily, hydralazine 50 mg 3 times daily, hydralazine 12.5 mg daily, and metoprolol 100 mg twice daily. ?-Allow for permissive hypertension given acute stroke and slowly normalize blood pressure in the next 5 to 7 days secondary to acute CVA. ?-Continue Norvasc to 5 mg daily, Lopressor 25 mg twice daily. ?-Hydralazine IV as needed for systolic blood pressures greater than [id number] or diastolic blood pressure greater than 110 ? ?Hypokalemia ?Acute. ?  Initial potassium 3.4. ?-Repleted, potassium at 3.7.   ?-Repeat labs in the morning.  ? ?E-coli UTI ?- Urine cultures with > 100,000 colonies of E. Coli.  Sensitivities pending. ?-Patient started on IV Rocephin 05/22/2021 which we will continue pending sensitivities and finalization of culture results. ? ?CKD (chronic kidney disease) stage 3, GFR 30-59 ml/min (HCC) ? Creatinine 1.21 with BUN 10.  Baseline creatinine previously had been around 0.9 back in 2019 per records on Care Everywhere.  This may constitute an acute kidney injury. ?-Urinalysis concerning for UTI with large leukocytes, positive nitrite, many bacteria, WBC > 50.  Protein of 100. ?-Renal function trending down with gentle hydration and currently at 1.07. ?-IV fluids have been discontinued. ? 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 ?-PT/OT. ? ?Obesity (BMI 30-39.9) ?BMI initially calculated at 37.3 kg/m?. ?-Lifestyle modification. ?-Outpatient follow-up with  PCP. ? ?Productive cough ?Acute on chronic.  Patient has reportedly had this intermittent cough for which she takes Mucinex at home.  Lung sounds on exam appear to be clear. ?-Incentive spirometry ?-Continue Mucinex ?-Chest x-ray which noted minimal bibasilar atelectasis  ?-Incentive spirometry, flutter valve. ?-On IV antibiotics secondary to UTI. ? ? ? ? ?  ? ? ?DVT prophylaxis: Lovenox ?Code Status: Full ?Family Communication: Updated patient and husband at bedside. ?Disposition: CIR when bed available. ? ?Status is: Inpatient ?Remains inpatient appropriate because: Severity of illness. ?  ?Consultants:  ?Neurology: Dr.Khaliqdina 05/21/2021 ? ?Procedures:  ?CT head 05/21/2021 ?CT angiogram head and neck 05/21/2021 ?2D echo with bubble study 05/21/2021 ?MRI brain 05/21/2021 ?2D echo with bubble study 05/23/2021 ?TEE scheduled for 05/24/2021 ?Lower extremity Dopplers 05/23/2021  ?TEE per Dr. Radford Pax cardiology 05/24/2021 ? ? ?Antimicrobials:  ?IV Rocephin 05/22/2021>>>> ? ? ?Subjective: ?Sitting up in bed, alert, some dysarthric speech.  Still with left-sided weakness.  Underwent TEE earlier on this morning.  Husband at bedside.  ? ?Objective: ?Vitals:  ? 05/24/21 0850 05/24/21 0900 05/24/21 0910 05/24/21 1110  ?BP: 133/88 (!) 172/103 (!) 169/99 (!) 160/97  ?Pulse: 98 97 96 96  ?Resp: (!) 22 19 (!) 21 18  ?Temp: (!) 97.2 ?F (36.2 ?C)   98.3 ?F (36.8 ?C)  ?TempSrc: Tympanic     ?SpO2: 96% 95% 95% 97%  ?Weight:      ?Height:      ? ? ?Intake/Output Summary (Last 24 hours) at 05/24/2021 1847 ?  Last data filed at 05/24/2021 1800 ?Gross per 24 hour  ?Intake 600 ml  ?Output 850 ml  ?Net -250 ml  ? ?Filed Weights  ? 05/21/21 0800 05/24/21 0735  ?Weight: 111.4 kg 111.4 kg  ? ? ?Examination: ? ?General exam: NAD. ?Respiratory system: Lungs clear to auscultation bilaterally.  No wheezes, no crackles, no rhonchi.  Fair air movement.  Normal respiratory effort. ?Cardiovascular system: RRR no murmurs rubs or gallops.  No JVD.  No lower  extremity edema.  ?Gastrointestinal system: Abdomen is soft, nontender, nondistended, positive bowel sounds.  No rebound.  No guarding.   ?Central nervous system: Alert and oriented.  Dysarthric speech.  5/5 right upper ext

## 2021-05-24 NOTE — Progress Notes (Signed)
Speech Language Pathology Treatment:    ?Patient Details ?Name: Kamillah Didonato ?MRN: [id number] ?DOB: 1965-06-30 ?Today's Date: 05/24/2021 ?Time:  -  ?  ? ?Pt being seen for cognition and dysarthria. She is currently in a procedure. Will continue efforts  ? ?   ?    ?   ? ? ? ?   ? ? ? ? ?  ?  ? ? ?Royce Macadamia ? ?05/24/2021, 8:14 AM ?

## 2021-05-24 NOTE — Progress Notes (Signed)
Inpatient Rehabilitation Admissions Coordinator  ? ?I await insurance approval and medical workup completion for possible CIR admit. I met at bedside with patient and her spouse. ? ?Danne Baxter, RN, MSN ?Rehab Admissions Coordinator ?[phone] ?05/24/2021 11:34 AM ? ?

## 2021-05-24 NOTE — Anesthesia Postprocedure Evaluation (Signed)
Anesthesia Post Note ? ?[name] ? ?Procedure(s) Performed: TRANSESOPHAGEAL ECHOCARDIOGRAM (TEE) ?BUBBLE STUDY ? ?  ? ?Patient location during evaluation: PACU ?Anesthesia Type: MAC ?Level of consciousness: awake and alert ?Pain management: pain level controlled ?Vital Signs Assessment: post-procedure vital signs reviewed and stable ?Respiratory status: spontaneous breathing, nonlabored ventilation and respiratory function stable ?Cardiovascular status: blood pressure returned to baseline and stable ?Postop Assessment: no apparent nausea or vomiting ?Anesthetic complications: no ? ? ?No notable events documented. ? ?Last Vitals:  ?Vitals:  ? 05/24/21 0910 05/24/21 1110  ?BP: (!) 169/99 (!) 160/97  ?Pulse: 96 96  ?Resp: (!) 21 18  ?Temp:  36.8 ?C  ?SpO2: 95% 97%  ?  ?Last Pain:  ?Vitals:  ? 05/24/21 0910  ?TempSrc:   ?PainSc: 0-No pain  ? ? ?  ?  ?  ?  ?  ?  ? ?Jarome Matin Shaunta Oncale ? ? ? ? ?

## 2021-05-24 NOTE — Progress Notes (Signed)
Physical Therapy Treatment ?Patient Details ?Name: Gwendolyn Morales ?MRN: [id number] ?DOB: [birth year] ?Today's Date: 05/24/2021 ? ? ?History of Present Illness 56 y.o. female presents with complaints of left-sided weakness. MRI + Advanced chronic small-vessel ischemic changes; acute small-vessel deep white matter infarctions adjacent to the posterior body of the right lateral ventricle, the anterior body of the right lateral ventricle in the posterior body of the left  lateral ventricle; late subacutte to old infarct of the R posteromedial temporal and occipital lobes. 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 ?  ?PT Comments  ? ? Pt is making steady progress toward goals, limited by significant L sided weakness along with residual R sided weakness.  Emphasis on warm up, rolling, transition side to sit, scooting, sitting balance at EOB, sit to stand at [facility], into the RW and in the STEDY before use of the STEDY to transfer to the recliner. ?   ?Recommendations for follow up therapy are one component of a multi-disciplinary discharge planning process, led by the attending physician.  Recommendations may be updated based on patient status, additional functional criteria and insurance authorization. ? ?Follow Up Recommendations ? Acute inpatient rehab (3hours/day) ?  ?  ?Assistance Recommended at Discharge Frequent or constant Supervision/Assistance  ?Patient can return home with the following A little help with walking and/or transfers;A little help with bathing/dressing/bathroom;Assistance with cooking/housework;Assist for transportation;Help with stairs or ramp for entrance ?  ?Equipment Recommendations ? Other (comment)  ?  ?Recommendations for Other Services Rehab consult ? ? ?  ?Precautions / Restrictions Precautions ?Precautions: Fall  ?  ? ?Mobility ? Bed Mobility ?Overal bed mobility: Needs Assistance ?Bed Mobility: Rolling, Sidelying to  Sit ?Rolling: Mod assist ?Sidelying to sit: Mod assist, +2 for physical assistance ?  ?  ?  ?General bed mobility comments: required assistance to scoot to EOB ?  ? ?Transfers ?Overall transfer level: Needs assistance ?Equipment used: Ambulation equipment used ?Transfers: Bed to chair/wheelchair/BSC, Sit to/from Stand ?Sit to Stand: Max assist, +2 physical assistance (x5) ?  ?  ?Squat pivot transfers: +2 physical assistance, Max assist ?  ?  ?General transfer comment: mod to max assist +2 to stand from EOB and into stedy with patient demonstrating pushing with RUE ?Transfer via Lift Equipment: Stedy ? ?Ambulation/Gait ?  ?  ?  ?  ?  ?  ?  ?  ? ? ?Stairs ?  ?  ?  ?  ?  ? ? ?Wheelchair Mobility ?  ? ?Modified Rankin (Stroke Patients Only) ?Modified Rankin (Stroke Patients Only) ?Modified Rankin: Severe disability ? ? ?  ?Balance Overall balance assessment: Needs assistance ?Sitting-balance support: No upper extremity supported, Feet supported ?Sitting balance-Leahy Scale: Fair ?Sitting balance - Comments: occasional min guard assist for balance sitting on EOB ?  ?Standing balance support: During functional activity, Bilateral upper extremity supported ?Standing balance-Leahy Scale: Poor ?Standing balance comment: bed pads used to power up into standing with 2 person hand held assist and with standing with Stedy ?  ?  ?  ?  ?  ?  ?  ?  ?  ?  ?  ?  ? ?  ?Cognition Arousal/Alertness: Awake/alert ?Behavior During Therapy: [facility] for tasks assessed/performed ?Overall Cognitive Status: Impaired/Different from baseline ?Area of Impairment: Attention, Safety/judgement, Awareness, Problem solving ?  ?  ?  ?  ?  ?  ?  ?  ?  ?Current Attention Level: Sustained ?  ?  ?  ?  Awareness: Emergent ?Problem Solving: Slow processing ?General Comments: pleasant and eager to participate with therapy ?  ?  ? ?  ?Exercises Other Exercises ?Other Exercises: hip/knee flexion/ext bil with graded assist/resistance x10 reps. ?Other Exercises:  bicep/tricep presses x10 with resistance to $ UE,  P/AA ROM to L hand and elbow. ? ?  ?General Comments General comments (skin integrity, edema, etc.): husband withnessed the session and participated in the session. ?  ?  ? ?Pertinent Vitals/Pain Pain Assessment ?Pain Assessment: Faces ?Faces Pain Scale: Hurts a little bit ?Pain Location: L arm , shoulder, traps. ?Pain Descriptors / Indicators: Sore ?Pain Intervention(s): Monitored during session  ? ? ?Home Living   ?  ?  ?  ?  ?  ?  ?  ?  ?  ?   ?  ?Prior Function    ?  ?  ?   ? ?PT Goals (current goals can now be found in the care plan section) Acute Rehab PT Goals ?PT Goal Formulation: With patient ?Time For Goal Achievement: 06/05/21 ?Potential to Achieve Goals: Fair ?Progress towards PT goals: Progressing toward goals ? ?  ?Frequency ? ? ? Min 4X/week ? ? ? ?  ?PT Plan Current plan remains appropriate  ? ? ?Co-evaluation PT/OT/SLP Co-Evaluation/Treatment: Yes ?Reason for Co-Treatment: For patient/therapist safety ?PT goals addressed during session: Mobility/safety with mobility ?OT goals addressed during session: ADL's and self-care ?  ? ?  ?AM-PAC PT "6 Clicks" Mobility   ?Outcome Measure ? Help needed turning from your back to your side while in a flat bed without using bedrails?: A Lot ?Help needed moving from lying on your back to sitting on the side of a flat bed without using bedrails?: A Lot ?Help needed moving to and from a bed to a chair (including a wheelchair)?: Total ?Help needed standing up from a chair using your arms (e.g., wheelchair or bedside chair)?: Total ?Help needed to walk in hospital room?: Total ?Help needed climbing 3-5 steps with a railing? : Total ?6 Click Score: 8 ? ?  ?End of Session   ?Activity Tolerance: Patient tolerated treatment well ?Patient left: in chair;with call bell/phone within reach;with chair alarm set ?Nurse Communication: Mobility status ?PT Visit Diagnosis: Muscle weakness (generalized) (M62.81);Hemiplegia and  hemiparesis;Other symptoms and signs involving the nervous system (R29.898) ?Hemiplegia - Right/Left: Left ?Hemiplegia - dominant/non-dominant: Non-dominant ?Hemiplegia - caused by: Nontraumatic intracerebral hemorrhage ?  ? ? ?Time: 1322-1401 ?PT Time Calculation (min) (ACUTE ONLY): 39 min ? ?Charges:  $Neuromuscular Re-education: 8-22 mins          ?          ? ?05/24/2021 ? ?Jacinto Halim., PT ?Acute Rehabilitation Services ?[phone]  (pager) ?[phone]  (office) ? ? ?Eliseo Gum Nirel Babler ?05/24/2021, 3:22 PM ? ?

## 2021-05-24 NOTE — Progress Notes (Signed)
Occupational Therapy Treatment ?Patient Details ?Name: Gwendolyn Morales ?MRN: [id number] ?DOB: Jan 18, 1966 ?Today's Date: 05/24/2021 ? ? ?History of present illness 56 y.o. female presents with complaints of left-sided weakness. MRI + Advanced chronic small-vessel ischemic changes; acute small-vessel deep white matter infarctions adjacent to the posterior body of the right lateral ventricle, the anterior body of the right lateral ventricle in the posterior body of the left  lateral ventricle; late subacutte to old infarct of the R posteromedial temporal and occipital lobes. 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  ?OT comments ? Patient received in bed and agreeable to OT/PT session. Patient instructed on side lying to sitting on EOB with mod assist +2.  2 person hand held assist sit to stands performed from EOB with use of bed pads to power up with left lateral leaning. Patient stood into stedy with improved standing but continued to demo left lateral leaning but was able to correct with verbal cues. Patient assist to recliner with stedy. Patient is good candidate for AIR for continued rehab.   ? ?Recommendations for follow up therapy are one component of a multi-disciplinary discharge planning process, led by the attending physician.  Recommendations may be updated based on patient status, additional functional criteria and insurance authorization. ?   ?Follow Up Recommendations ? Acute inpatient rehab (3hours/day)  ?  ?Assistance Recommended at Discharge Frequent or constant Supervision/Assistance  ?Patient can return home with the following ? Two people to help with walking and/or transfers;Two people to help with bathing/dressing/bathroom;Assistance with cooking/housework;Assistance with feeding;Direct supervision/assist for medications management;Direct supervision/assist for financial management;Assist for transportation;Help with stairs or ramp for  entrance ?  ?Equipment Recommendations ? Hospital bed  ?  ?Recommendations for Other Services   ? ?  ?Precautions / Restrictions Precautions ?Precautions: Fall  ? ? ?  ? ?Mobility Bed Mobility ?Overal bed mobility: Needs Assistance ?Bed Mobility: Rolling, Sidelying to Sit ?Rolling: Mod assist ?Sidelying to sit: Mod assist, +2 for physical assistance ?  ?  ?  ?General bed mobility comments: required assistance to scoot to EOB ?  ? ?Transfers ?Overall transfer level: Needs assistance ?Equipment used: Ambulation equipment used ?Transfers: Bed to chair/wheelchair/BSC, Sit to/from Stand ?Sit to Stand: Max assist, +2 physical assistance ?  ?  ?  ?  ?  ?General transfer comment: mod to max assist +2 to stand from EOB and into stedy with patient demonstrating pushing with RUE ?Transfer via Lift Equipment: Stedy ?  ?Balance Overall balance assessment: Needs assistance ?Sitting-balance support: No upper extremity supported, Feet supported ?Sitting balance-Leahy Scale: Fair ?Sitting balance - Comments: occasional min guard assist for balance sitting on EOB ?  ?Standing balance support: During functional activity, Bilateral upper extremity supported ?Standing balance-Leahy Scale: Poor ?Standing balance comment: bed pads used to power up into standing with 2 person hand held assist and with standing with Stedy ?  ?  ?  ?  ?  ?  ?  ?  ?  ?  ?  ?   ? ?ADL either performed or assessed with clinical judgement  ? ?ADL Overall ADL's : Needs assistance/impaired ?  ?  ?  ?  ?  ?  ?Lower Body Bathing: Maximal assistance;Sit to/from stand ?Lower Body Bathing Details (indicate cue type and reason): peri area cleaning performe while standing with max assist +2 for standing ?  ?  ?  ?  ?  ?  ?  ?  ?  ?  ?  ?  General ADL Comments: focused on sitting and standing balance ?  ? ?Extremity/Trunk Assessment Upper Extremity Assessment ?RUE Deficits / Details: generalized weakness but functional ?LUE Deficits / Details: PROM overall WFL, increased  spasticity; moves in flexor pattern however ablet o move out of synergy; not functional at this time ?LUE Sensation: decreased light touch;decreased proprioception ?LUE Coordination: decreased fine motor;decreased gross motor ?  ?  ?  ?  ?  ? ?Vision   ?  ?  ?Perception   ?  ?Praxis   ?  ? ?Cognition Arousal/Alertness: Awake/alert ?Behavior During Therapy: [facility] for tasks assessed/performed ?Overall Cognitive Status: Impaired/Different from baseline ?Area of Impairment: Attention, Safety/judgement, Awareness, Problem solving ?  ?  ?  ?  ?  ?  ?  ?  ?  ?Current Attention Level: Sustained ?  ?  ?  ?Awareness: Emergent ?Problem Solving: Slow processing ?General Comments: pleasant and eager to participate with therapy ?  ?  ?   ?Exercises   ? ?  ?Shoulder Instructions   ? ? ?  ?General Comments    ? ? ?Pertinent Vitals/ Pain       Pain Assessment ?Pain Assessment: Faces ?Faces Pain Scale: Hurts a little bit ?Pain Location: L arm , shoulder, traps. ?Pain Descriptors / Indicators: Sore ?Pain Intervention(s): Monitored during session, Repositioned ? ?Home Living   ?  ?  ?  ?  ?  ?  ?  ?  ?  ?  ?  ?  ?  ?  ?  ?  ?  ?  ? ?  ?Prior Functioning/Environment    ?  ?  ?  ?   ? ?Frequency ? Min 2X/week  ? ? ? ? ?  ?Progress Toward Goals ? ?OT Goals(current goals can now be found in the care plan section) ? Progress towards OT goals: Progressing toward goals ? ?Acute Rehab OT Goals ?Patient Stated Goal: go to rehab ?OT Goal Formulation: With patient/family ?Time For Goal Achievement: 06/05/21 ?Potential to Achieve Goals: Good ?ADL Goals ?Pt Will Perform Grooming: with supervision;with set-up;sitting ?Pt Will Perform Upper Body Bathing: with supervision;with set-up;sitting ?Pt Will Perform Lower Body Bathing: with mod assist;bed level ?Pt Will Transfer to Toilet: with mod assist;squat pivot transfer ?Additional ADL Goal #1: Pt will maintain midlien postural control in sitting during dynamic activity to increased independence with ADL  and mobility  ?Plan Discharge plan remains appropriate   ? ?Co-evaluation ? ? ? PT/OT/SLP Co-Evaluation/Treatment: Yes ?Reason for Co-Treatment: For patient/therapist safety;To address functional/ADL transfers ?  ?OT goals addressed during session: ADL's and self-care ?  ? ?  ?AM-PAC OT "6 Clicks" Daily Activity     ?Outcome Measure ? ? Help from another person eating meals?: A Little ?Help from another person taking care of personal grooming?: A Lot ?Help from another person toileting, which includes using toliet, bedpan, or urinal?: Total ?Help from another person bathing (including washing, rinsing, drying)?: A Lot ?Help from another person to put on and taking off regular upper body clothing?: A Lot ?Help from another person to put on and taking off regular lower body clothing?: Total ?6 Click Score: 11 ? ?  ?End of Session Equipment Utilized During Treatment: Gait belt;Other (comment) Charlaine Dalton) ? ?OT Visit Diagnosis: Unsteadiness on feet (R26.81);Other abnormalities of gait and mobility (R26.89);Muscle weakness (generalized) (M62.81);History of falling (Z91.81);Other symptoms and signs involving cognitive function;Other symptoms and signs involving the nervous system (R29.898);Hemiplegia and hemiparesis ?Hemiplegia - Right/Left: Left ?Hemiplegia - dominant/non-dominant: Non-Dominant ?Hemiplegia -  caused by: Cerebral infarction ?  ?Activity Tolerance Patient tolerated treatment well ?  ?Patient Left in chair;with call bell/phone within reach;with chair alarm set;with family/visitor present ?  ?Nurse Communication Mobility status;Need for lift equipment ?  ? ?   ? ?Time: 1322-1401 ?OT Time Calculation (min): 39 min ? ?Charges: OT General Charges ?$OT Visit: 1 Visit ?OT Treatments ?$Therapeutic Activity: 23-37 mins ? ?Lodema Hong, OTA ?Acute Rehabilitation Services  ?Pager [phone] ?Office [phone] ? ? ?[place] ?05/24/2021, 2:35 PM ?

## 2021-05-24 NOTE — Plan of Care (Signed)
Pt is alert oriented x 4, pt has slurred speech. Pt is RA. Pt requires assistance with turning in the bed. Pt BP elevated required hydralazine IV x 2. Pt has purewick in place for urine, amber colored and cloudy. Pt has moments of anxiety, received PRN hydroxyzine effective for anxiety. Pt also states a cold cloth on her head helps her with her anxiety.  ? ? ?Problem: Education: ?Goal: Knowledge of secondary prevention will improve (SELECT ALL) ?Outcome: Progressing ?Goal: Knowledge of patient specific risk factors will improve (INDIVIDUALIZE FOR PATIENT) ?Outcome: Progressing ?Goal: Individualized Educational Video(s) ?Outcome: Progressing ?  ?Problem: Coping: ?Goal: Will verbalize positive feelings about self ?Outcome: Progressing ?Goal: Will identify appropriate support needs ?Outcome: Progressing ?  ?Problem: Self-Care: ?Goal: Verbalization of feelings and concerns over difficulty with self-care will improve ?Outcome: Progressing ?Goal: Ability to communicate needs accurately will improve ?Outcome: Progressing ?  ?Problem: Nutrition: ?Goal: Risk of aspiration will decrease ?Outcome: Progressing ?  ?Problem: Education: ?Goal: Knowledge of General Education information will improve ?Description: Including pain rating scale, medication(s)/side effects and non-pharmacologic comfort measures ?Outcome: Progressing ?  ?Problem: Health Behavior/Discharge Planning: ?Goal: Ability to manage health-related needs will improve ?Outcome: Progressing ?  ?Problem: Clinical Measurements: ?Goal: Ability to maintain clinical measurements within normal limits will improve ?Outcome: Progressing ?Goal: Will remain free from infection ?Outcome: Progressing ?Goal: Diagnostic test results will improve ?Outcome: Progressing ?Goal: Respiratory complications will improve ?Outcome: Progressing ?Goal: Cardiovascular complication will be avoided ?Outcome: Progressing ?  ?Problem: Activity: ?Goal: Risk for activity intolerance will  decrease ?Outcome: Progressing ?  ?Problem: Coping: ?Goal: Level of anxiety will decrease ?Outcome: Progressing ?  ?Problem: Elimination: ?Goal: Will not experience complications related to bowel motility ?Outcome: Progressing ?Goal: Will not experience complications related to urinary retention ?Outcome: Progressing ?  ?Problem: Safety: ?Goal: Ability to remain free from injury will improve ?Outcome: Progressing ?  ?Problem: Skin Integrity: ?Goal: Risk for impaired skin integrity will decrease ?Outcome: Progressing ?  ?

## 2021-05-24 NOTE — Progress Notes (Signed)
?  Echocardiogram ?Echocardiogram Transesophageal has been performed. ? ?Gwendolyn Morales ?05/24/2021, 9:01 AM ?

## 2021-05-24 NOTE — Progress Notes (Signed)
STROKE TEAM PROGRESS NOTE  ? ?INTERVAL HISTORY ?Patient just returned from TEE which showed no cardiac source of embolism.  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  Grade 2 atherosclerotic plaque in the thoracic and ascending aorta.  No right-to-left shunt or PFO.  Neurological exam is unchanged.  Vital signs are stable.  TCD bubble study however yesterday was positive for medium size shunt and lower extremity venous Doppler was negative for DVT ?Vitals:  ? 05/24/21 0850 05/24/21 0900 05/24/21 0910 05/24/21 1110  ?BP: 133/88 (!) 172/103 (!) 169/99 (!) 160/97  ?Pulse: 98 97 96 96  ?Resp: (!) 22 19 (!) 21 18  ?Temp: (!) 97.2 ?F (36.2 ?C)   98.3 ?F (36.8 ?C)  ?TempSrc: Tympanic     ?SpO2: 96% 95% 95% 97%  ?Weight:      ?Height:      ? ?CBC:  ?Recent Labs  ?Lab 05/21/21 ?0806 05/21/21 ?[id number] 05/23/21 ?0326 05/24/21 ?0333  ?WBC 9.2  --  10.8* 9.7  ?NEUTROABS 6.8  --   --   --   ?HGB 15.7*   < > 14.4 14.7  ?HCT 50.0*   < > 45.7 44.7  ?MCV 84.3  --  82.3 81.4  ?PLT 290  --  341 340  ? < > = values in this interval not displayed.  ? ?Basic Metabolic Panel:  ?Recent Labs  ?Lab 05/23/21 ?0326 05/24/21 ?0333  ?NA 139 139  ?K 3.6 3.7  ?CL 109 109  ?CO2 24 25  ?GLUCOSE 112* 103*  ?BUN 11 11  ?CREATININE 1.08* 1.07*  ?CALCIUM 9.9 9.6  ?MG 2.0 2.1  ? ?Lipid Panel:  ?Recent Labs  ?Lab 05/22/21 ?0413  ?CHOL 184  ?TRIG 129  ?HDL 40*  ?CHOLHDL 4.6  ?VLDL 26  ?LDLCALC 118*  ? ?HgbA1c:  ?Recent Labs  ?Lab 05/22/21 ?0413  ?HGBA1C 5.4  ? ?Urine Drug Screen:  ?Recent Labs  ?Lab 05/21/21 ?2146  ?LABOPIA NONE DETECTED  ?COCAINSCRNUR NONE DETECTED  ?LABBENZ NONE DETECTED  ?AMPHETMU NONE DETECTED  ?THCU NONE DETECTED  ?LABBARB NONE DETECTED  ?  ?Alcohol Level No results for input(s): ETH in the last 168 hours. ? ?IMAGING past 24 hours ?No results found. ? ?PHYSICAL EXAM ? ?Physical Exam  ?Constitutional: Appears mildly obese middle-aged Caucasian lady.  She  appears emotional and upset ?Cardiovascular: Normal rate and regular rhythm.  ?Respiratory: Effort normal, non-labored breathing ? ? ?Neuro: ?Mental Status: ?Patient is awake, alert, oriented to person, place, month, year, and situation. ?Voice is soft.  No obvious cognitive deficit. ?No signs of aphasia or neglect ?Cranial Nerves: ?II: Visual Fields are full. Pupils are equal, round, and reactive to light.   ?III,IV, VI: EOMI without ptosis or diploplia.  ?V: Facial sensation is symmetric to temperature ?VII: Facial movement is symmetric resting but slight left nasolabial fold asymmetry while smiling ?VIII: Hearing is intact to voice ?X: voice is hypophonic ?XI: Shoulder shrug is symmetric. ?XII: Tongue protrudes midline without atrophy or fasciculations.  ?Motor: ?Tone is increased in the left. Bulk is normal.  ?RUE 5/5 left hemiparesis with drift LUE 3/5 ?RLE 4/5 LLE 2/5 ?Sensory: ?Sensation is symmetric to light touch and temperature in the arms and legs. No extinction to DSS present.  ?Cerebellar: ?FNF intact on the right, unable to complete on the left ? ?NIHSS 7 .Premorbid modified Rankin score 3 ? ?ASSESSMENT/PLAN ?[name] is a 56 y.o. female with history of significant  for malignant HTN, prior hx of L parietal lobe ICH in 2017 with ischemic infarcts with mild residual R sided weakness who presents with L sided weakness. She went to bed at 2130 on 05/20/21 and woke up in the morning with L sided weakness. EMS brought her in as a code stroke. Her BP was elevated to 200s over 130s.  MRI shows multiple acute small deep vessel white matter infarctions adjacent to the right and left lateral ventricles and a late subacute infarct of the right posterior medial temporal lobe and occipital lobe.  Plan for TCD with bubble study, TEE scheduled for Friday at 8 AM.  Consider sleep smart study work-up. ? ?Stroke: Multiple acute small deep vessel white matter infarcts adjacent to the right lateral ventricle and  left lateral ventricle likely secondary cryptogenic versus embolism from mitral annulus calcification   ?Code Stroke CT head No acute abnormality.  Subacute infarct in the right occipital lobe.  Old right frontal stroke.  Extensive chronic SVD ASPECTS 10.    ?CTA head & neck 50% stenosis of the right vertebral artery, 30% stenosis in both vertebral artery V4 segments, right PCA shows flow ?MRI chronic SVD, acute small vessel deep white matter infarctions adjacent to the posterior body of the right lateral ventricle, the anterior body of the right lateral ventricle, and the posterior body of the left lateral ventricle.  Late subacute infarct of the right posterior medial temporal lobe and occipital lobe. ?2D Echo EF 60-65% ?TCD with bubble study-positive for moderate size right to left shunt TEE Pending Friday ?LDL 118 ?HgbA1c 5.4 ?VTE prophylaxis -Lovenox ?   ?Diet  ? Diet Heart Room service appropriate? Yes; Fluid consistency: Thin  ? ?aspirin 81 mg daily prior to admission, now on aspirin 81 mg daily and clopidogrel 75 mg daily. X 3 weeks and then aspirin alone ?Therapy recommendations: Pending ?Disposition: Pending ? ?Hypertension ?Home meds: Amlodipine 2.5 mg, metoprolol 100 mg, hydralazine 50 mg, hydrochlorothiazide 12.5 mg- not taking consistently ?Permissive hypertension (OK if < 220/120) but gradually normalize in 5-7 days ?Long-term BP goal normotensive ? ?Hyperlipidemia ?Home meds: Atorvastatin 40 mg, resumed in hospital ?LDL 118, goal < 70 ?Increase to atorvastatin 80 mg ?Continue statin at discharge ? ?Other Stroke Risk Factors ?Obesity, Body mass index is 35.24 kg/m?., BMI >/= 30 associated with increased stroke risk, recommend weight loss, diet and exercise as appropriate  ?Hx stroke/TIA ?Multifocal infarcts with hemorrhagic infarcts  ? ?Other Active Problems ?CKD stage 3 ?Cr 1.21 -> 1.10 ?Hypokalemia ?K 3.4 ?Productive cough- chronic ?Home meds- mucinex ?CXR- bibasilar atelectasis ? ?Hospital day #  3 ? ? Patient `s neurological exam remains unchanged.  TCD bubble study performed at the bedside is positive for medium size right to left shunt.  However TEE does not show significant right-to-left shunt hence she is not a candidate for endovascular PFO closure.     Recommend continue   aspirin and Plavix for 3 weeks followed by aspirin alone.  Patient also appears to be at risk for sleep apnea and may benefit with consideration for participation in the sleep smart stroke prevention study.  I have given the information to review and decide.  Physical occupational and speech therapy consult and she will likely need inpatient rehab.  Long discussion patient and answered questions.  Discussed with patient, husband and Dr. Grandville Silos.  Greater than 50% time during this 35-minute visit was spent on counseling and coordination of care about multiple strokes and discussion about stroke evaluation, prevention and  treatment and answering questions ? ?Antony Contras, MD ?Medical Director ?[facility] ?Pager: [phone] ?05/24/2021 2:54 PM ? ? ?To contact Stroke Continuity provider, please refer to [email or web address] ?After hours, contact General Neurology ? ?

## 2021-05-24 NOTE — Interval H&P Note (Signed)
History and Physical Interval Note: ? ?05/24/2021 ?8:00 AM ? ?Gwendolyn Morales  has presented today for surgery, with the diagnosis of MITRAL MASS.  The various methods of treatment have been discussed with the patient and family. After consideration of risks, benefits and other options for treatment, the patient has consented to  Procedure(s): ?TRANSESOPHAGEAL ECHOCARDIOGRAM (TEE) (N/A) as a surgical intervention.  The patient's history has been reviewed, patient examined, no change in status, stable for surgery.  I have reviewed the patient's chart and labs.  Questions were answered to the patient's satisfaction.   ? ? ?Giannis Corpuz ? ? ?

## 2021-05-24 NOTE — CV Procedure (Addendum)
? ? ?  PROCEDURE NOTE: ? ?Procedure:  Transesophageal echocardiogram ?Operator:  Fransico Him, MD ?Indications:  CVA/Abnormal MV ?Complications: None ? ?During this procedure the patient is administered a total of Propofol 245 mg and 40mg  Lidocaine to achieve and maintain moderate conscious sedation.  The patient's heart rate, blood pressure, and oxygen saturation are monitored continuously during the procedure by anesthesia.  ? ?Results: ?Normal LV size and hyperdynamic function ?Normal RV size and function ?Normal RA ?Normal LA with mild spontaneous echo contrast.  No thrombus in LA or LAA ?Normal TV ?Normal PV with trivial PR ?Degenerative mitral valve with severe mitral annular calcification and calcified posterior leaflet that is fixed and immobile.  Trivial MR.  ?Trileaflet AV with AV sclerosis and no stenosis. ?Normal interatrial septum with no evidence of shunt by colorflow dopper or agitated saline contrast ?Mild Grade 2 atherosclerotic plaque in the thoracic and ascending aorta ? ?The patient tolerated the procedure well and was transferred back to their room in stable condition. ? ?Patient had severe snoring with episodes of apnea during sleep.  Recommend in lab split night sleep study.  ? ?Signed: ?Fransico Him, MD ?[facility]  ?

## 2021-05-24 NOTE — Transfer of Care (Signed)
Immediate Anesthesia Transfer of Care Note ? ?[name] ? ?Procedure(s) Performed: TRANSESOPHAGEAL ECHOCARDIOGRAM (TEE) ?BUBBLE STUDY ? ?Patient Location: Endoscopy Unit ? ?Anesthesia Type:MAC ? ?Level of Consciousness: drowsy ? ?Airway & Oxygen Therapy: Patient Spontanous Breathing and Patient connected to nasal cannula oxygen ? ?Post-op Assessment: Report given to RN, Post -op Vital signs reviewed and stable and moving R side greater than L, baseline in preop (stroke hx) ? ?Post vital signs: Reviewed and stable ? ?Last Vitals:  ?Vitals Value Taken Time  ?BP 133/88 05/24/21 0850  ?Temp 36.2 ?C 05/24/21 0850  ?Pulse 98 05/24/21 0850  ?Resp 20 05/24/21 0850  ?SpO2 96 % 05/24/21 0850  ?Vitals shown include unvalidated device data. ? ?Last Pain:  ?Vitals:  ? 05/24/21 0850  ?TempSrc: Tympanic  ?PainSc: Asleep  ?   ? ?  ? ?Complications: No notable events documented. ?

## 2021-05-25 DIAGNOSIS — I63313 Cerebral infarction due to thrombosis of bilateral middle cerebral arteries: Secondary | ICD-10-CM

## 2021-05-25 LAB — URINE CULTURE: Culture: >=100000 — AB

## 2021-05-25 LAB — BASIC METABOLIC PANEL
Anion gap: 8 (ref 5–15)
BUN: 17 mg/dL (ref 6–20)
CO2: 23 mmol/L (ref 22–32)
Calcium: 9.6 mg/dL (ref 8.9–10.3)
Chloride: 112 mmol/L — ABNORMAL HIGH (ref 98–111)
Creatinine, Ser: 1.14 mg/dL — ABNORMAL HIGH (ref 0.44–1.00)
GFR, Estimated: 57 mL/min — ABNORMAL LOW (ref >60)
Glucose, Bld: 92 mg/dL (ref 70–99)
Potassium: 3.3 mmol/L — ABNORMAL LOW (ref 3.5–5.1)
Sodium: 143 mmol/L (ref 135–145)

## 2021-05-25 MED ORDER — CEPHALEXIN 500 MG PO CAPS
500.0000 mg | ORAL_CAPSULE | Freq: Four times a day (QID) | ORAL | Status: AC
  Administered 2021-05-26 (×4): 500 mg via ORAL
  Filled 2021-05-25 (×4): qty 1

## 2021-05-25 MED ORDER — POTASSIUM CHLORIDE CRYS ER 20 MEQ PO TBCR
40.0000 meq | EXTENDED_RELEASE_TABLET | Freq: Once | ORAL | Status: AC
  Administered 2021-05-25: 40 meq via ORAL
  Filled 2021-05-25: qty 2

## 2021-05-25 MED ORDER — METOPROLOL TARTRATE 50 MG PO TABS
50.0000 mg | ORAL_TABLET | Freq: Two times a day (BID) | ORAL | Status: DC
  Administered 2021-05-25 (×2): 50 mg via ORAL
  Filled 2021-05-25 (×2): qty 1

## 2021-05-25 MED ORDER — AMLODIPINE BESYLATE 10 MG PO TABS
10.0000 mg | ORAL_TABLET | Freq: Every day | ORAL | Status: DC
  Administered 2021-05-25 – 2021-05-27 (×3): 10 mg via ORAL
  Filled 2021-05-25 (×3): qty 1

## 2021-05-25 NOTE — Progress Notes (Signed)
STROKE TEAM PROGRESS NOTE  ? ?INTERVAL HISTORY ?Patient just returned from TEE which showed no cardiac source of embolism.  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  Grade 2 atherosclerotic plaque in the thoracic and ascending aorta.  No right-to-left shunt or PFO.  Neurological exam is unchanged.  Vital signs are stable.  TCD bubble study however yesterday was positive for medium size shunt and lower extremity venous Doppler was negative for DVT ? ?Vitals:  ? 05/25/21 0329 05/25/21 0749 05/25/21 1211 05/25/21 1214  ?BP: (!) 149/104 (!) 197/106 (!) 163/103 (!) 157/106  ?Pulse: 90 100 81   ?Resp: 18 20 14    ?Temp: 98.2 ?F (36.8 ?C) 98.4 ?F (36.9 ?C) 98.2 ?F (36.8 ?C)   ?TempSrc: Oral Oral Oral   ?SpO2: 94% 97% 95%   ?Weight:      ?Height:      ? ?CBC:  ?Recent Labs  ?Lab 05/21/21 ?0806 05/21/21 ?[id number] 05/23/21 ?0326 05/24/21 ?0333  ?WBC 9.2  --  10.8* 9.7  ?NEUTROABS 6.8  --   --   --   ?HGB 15.7*   < > 14.4 14.7  ?HCT 50.0*   < > 45.7 44.7  ?MCV 84.3  --  82.3 81.4  ?PLT 290  --  341 340  ? < > = values in this interval not displayed.  ? ?Basic Metabolic Panel:  ?Recent Labs  ?Lab 05/23/21 ?0326 05/24/21 ?[id number] 05/25/21 ?0206  ?NA 139 139 143  ?K 3.6 3.7 3.3*  ?CL 109 109 112*  ?CO2 24 25 23   ?GLUCOSE 112* 103* 92  ?BUN 11 11 17   ?CREATININE 1.08* 1.07* 1.14*  ?CALCIUM 9.9 9.6 9.6  ?MG 2.0 2.1  --   ? ?Lipid Panel:  ?Recent Labs  ?Lab 05/22/21 ?0413  ?CHOL 184  ?TRIG 129  ?HDL 40*  ?CHOLHDL 4.6  ?VLDL 26  ?LDLCALC 118*  ? ?HgbA1c:  ?Recent Labs  ?Lab 05/22/21 ?0413  ?HGBA1C 5.4  ? ?Urine Drug Screen:  ?Recent Labs  ?Lab 05/21/21 ?2146  ?LABOPIA NONE DETECTED  ?COCAINSCRNUR NONE DETECTED  ?LABBENZ NONE DETECTED  ?AMPHETMU NONE DETECTED  ?THCU NONE DETECTED  ?LABBARB NONE DETECTED  ?  ?Alcohol Level No results for input(s): ETH in the last 168 hours. ? ?IMAGING past 24 hours ?No results found. ? ?PHYSICAL EXAM ? ?Physical Exam   ?Constitutional: Appears mildly obese middle-aged Caucasian lady.  She appears emotional and upset ?Cardiovascular: Normal rate and regular rhythm.  ?Respiratory: Effort normal, non-labored breathing ? ?Neuro: ?Mental Status: ?Patient is awake, alert, oriented to person, place, month, year, and situation. ?Voice is soft with moderate to severe dysarthria.  No obvious cognitive deficit. ?No signs of aphasia or neglect ?Cranial Nerves: ?II: Visual Fields are full. Pupils are equal, round, and reactive to light.   ?III,IV, VI: EOMI without ptosis or diploplia.  ?V: Facial sensation is symmetric to temperature ?VII: mild left facial droop ?VIII: Hearing is intact to voice ?X: voice is hypophonic ?XI: Shoulder shrug is symmetric. ?XII: Tongue protrudes mildly to the left ?Motor: ?Tone is increased in the left. Bulk is normal.  ?RUE 5/5 left hemiparesis with drift LUE proximal 0/5 and distal 3/5 ?RLE 4/5 LLE 2/5 proximal and distally ?Sensory: ?Sensation is symmetric to light touch and temperature in the arms and legs. No extinction to DSS present.  ?Cerebellar: ?FNF intact on the right, unable to complete on the left ? ?NIHSS 7 .Premorbid modified Rankin score  3 ? ?ASSESSMENT/PLAN ?[name] is a 56 y.o. 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 She went to bed at 2130 on 05/20/21 and woke up in the morning with L sided weakness. EMS brought her in as a code stroke. Her BP was elevated to 200s over 130s.  MRI shows multiple acute small deep vessel white matter infarctions adjacent to the right and left lateral ventricles and a late subacute infarct of the right posterior medial temporal lobe and occipital lobe.  Plan for TCD with bubble study, TEE scheduled for Friday at 8 AM.  Consider sleep smart study work-up. ? ?Stroke: b/l CR small infarcts, R>L, likely small  vessel disease ?Code Stroke CT head No acute abnormality.  Subacute infarct in the right occipital lobe.  Old right frontal stroke.  Extensive chronic SVD ASPECTS 10.    ?CTA head & neck 50% stenosis of the right vertebral artery, 30% stenosis in both vertebral artery V4 segments.  No mention of any concern for CNS vasculitis at this time ?MRI chronic SVD, acute small vessel deep white matter infarctions adjacent to the posterior body of the right lateral ventricle, the anterior body of the right lateral ventricle, and the posterior body of the left lateral ventricle.  Late subacute infarct of the right posterior medial temporal lobe and occipital lobe. ?2D Echo EF 60-65% ?TCD with bubble study-positive for moderate size right to left shunt ?However, TEE showed no PFO ?LDL 118 ?HgbA1c 5.4 ?VTE prophylaxis -Lovenox ?aspirin 81 mg daily prior to admission, now on aspirin 81 mg daily and clopidogrel 75 mg daily x 3 weeks and then plavix alone ?Therapy recommendations: Pending ?Disposition: Pending ? ?Hx of ICH ?In 07/2015 patient admitted for multifocal ICH involving left parietal, left pontine with elevated BP.  CTA unremarkable but ? 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  MRI brain showed multifocal microbleeds and microinfarcts.  EF 60 to 65%, status post tracheostomy and discharged to CIR.  At baseline patient still has mild right-sided weakness and slurred speech. ? ?Hypertension ?Home meds: Amlodipine 2.5 mg, metoprolol 100 mg, hydralazine 50 mg, hydrochlorothiazide 12.5 mg- not taking consistently ?BP stable on the high end ?Recommend to resume partial home BP meds for BP control ?Long-term BP goal normotensive ? ?Hyperlipidemia ?Home meds: Atorvastatin 40 mg, resumed in hospital ?LDL 118, goal < 70 ?Increase to atorvastatin 80 mg ?Continue statin at discharge ? ?Other Stroke Risk Factors ?Obesity, Body mass index is 35.24 kg/m?., BMI >/= 30 associated with increased stroke risk,  recommend weight loss, diet and exercise as appropriate  ? ?Other Active Problems ?CKD stage 3 ?Cr 1.21 -> 1.10 ?Hypokalemia ?K 3.4 ?Productive cough- chronic ?Home meds- mucinex ?CXR- bibasilar atelectasis ? ?Hospital day # 4 ? ?Neurology will sign off. Please call with questions. Pt will follow up with stroke clinic Dr. Leonie Man at [facility] in about 4 weeks. Thanks for the consult. ? ?Rosalin Hawking, MD PhD ?Stroke Neurology ?05/26/2021 ?12:27 AM ? ? ?To contact Stroke Continuity provider, please refer to [email or web address] ?After hours, contact General Neurology ? ?

## 2021-05-25 NOTE — Progress Notes (Signed)
?PROGRESS NOTE ? ? ? ?Gwendolyn Morales  [id number] DOB: 1965/10/16 DOA: 05/21/2021 ?PCP: [name], MD  ? ? ?Chief Complaint  ?Patient presents with  ? Code Stroke  ? ? 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  Patient also noted to have some dysarthric speech that had worsened.  Normally patient noted to ambulate with the use of a walker at baseline since her prior stroke.  Patient brought to the ED as a code stroke.  On presentation patient noted to have a blood pressure of 204/148 with normal O2.  CT head done with signs of subacute right PCA stroke and chronic right frontal stroke.  Patient noted to not be a candidate for thrombolytics due to prior history of intracranial hemorrhage.  CT angiogram head and neck done was negative for LVO.  MRI significant for acute small vessel deep white matter infarcts adjacent to the posterior body of the right lateral ventricle, anterior body of the right lateral ventricle, posterior body of the left lateral ventricle and related to old infarct of the right posterior medial temporal lobe and occipital lobe.  Urinalysis also concerning for UTI.  Patient admitted for stroke work-up.  Neurology consulted and following  ? ? ?Assessment & Plan: ? Principal Problem: ?  CVA (cerebral vascular accident) ([name]) ?Active Problems: ?  Hypertensive emergency ?  Hypokalemia ?  Productive cough ?  Obesity (BMI 30-39.9) ?  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  Stage 3a chronic kidney disease (CKD) ([name]) ?  History of CVA with residual deficit ?  E-coli UTI ? ? ? ?Assessment and Plan: ?* CVA (cerebral vascular accident) ([name]) ?Patient presented with complaints of left-sided weakness upon awakening the morning of admission.   ?-CT with signs of a subacute right PCA stroke  with chronic right frontal stroke.  Patient was not a thrombolytic candidate due to prior history of intracranial hemorrhage.  ?- CTA of the head and neck did not note any large vessel occlusion. ?-Patient seen in consultation by neurology who recommended admission for stroke work-up. ?-MRI brain done with chronic small vessel disease, acute small vessel deep white matter infarctions adjacent to the posterior body of the right lateral ventricle, anterior body of the right lateral ventricle, posterior body of the left lateral ventricle, late subacute infarct of the right posterior medial temporal lobe and occipital lobe. ?- ?- echocardiogram -limited study due to patient body habitus.  EF noted to be 60 to 65% with indeterminate diastolic parameters.  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  Message sent to [name] in order to help arrange TEE which.  ?-TCD bubble study done 05/23/2021 noted to be positive per neurology and as such lower extremity Dopplers was ordered, which was negative for DVT. 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  Grade 2 atherosclerotic plaque in the thoracic and descending aorta.  No right-to-left shunt or PFO noted. ?-LDL at 118. ?-Hemoglobin A1c 5.4. ?-PT/OT/ST. ?-Permissive hypertension for initial 24 hours and slowly normalize blood pressure in the next 5 to 7 days. ?-Increase Norvasc to 10 mg daily.  Increase Lopressor  to 50 mg twice daily.   ?-Continue statin. ?-Patient being followed by the stroke team who recommended aspirin and Plavix x3 weeks, and then plavix alone. ?-Per neurology concern for sleep apnea considering participation in sleep smog stroke prevention study which is  being managed by neurology. ?-Neurology also recommending 30-day event monitor postdischarge. ?-Appreciate neurology consultative services, will follow-up any further recommendations. ?-Awaiting CIR placement. ? ? ?Hypertensive emergency ?-On admission blood pressure was elevated up to 204/148.  Patient had not been taking any of her home blood pressure medications since possibly August 2022. ?- Previous blood pressure regimen included amlodipine 10 mg daily, hydralazine 50 mg 3 times daily, HCTZ 12.5 mg daily, and metoprolol 100 mg twice daily. ?-Allow for permissive hypertension given acute stroke and slowly normalize blood pressure in the next 5 to 7 days secondary to acute CVA. ?-Increase Norvasc to 10 mg daily, increase Lopressor to 50 mg twice daily.  ?-Hydralazine IV as needed for systolic blood pressures greater than [id number] or diastolic blood pressure greater than 110 ? ?Hypokalemia ?Acute. ?  Initial potassium 3.4. ?-Potassium at 3.3 today. ?-Replete. ?-Repeat labs in the morning.  ? ?E-coli UTI ?- Urine cultures with > 100,000 colonies of E. Coli which is pansensitive. ?-Transition from IV Rocephin to oral cephalexin to complete a 5-day course of treatment.   ? ?Stage 3a chronic kidney disease (CKD) ([name]) ?CKD stage IIIa.  ? Creatinine 1.21 with BUN 10.  Baseline creatinine previously had been around 0.9 back in 2019 per records on Care Everywhere.  This may constitute an acute kidney injury. ?-Urinalysis concerning for UTI with large leukocytes, positive nitrite, many bacteria, WBC > 50.  Protein of 100. ?-Renal function trending down with gentle hydration and currently at 1.07. ?-IV fluids have been discontinued. ? 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 ?-PT/OT. ? ?Obesity (BMI 30-39.9) ?BMI initially calculated at 37.3  kg/m?. ?-Lifestyle modification. ?-Outpatient follow-up with PCP. ? ?Productive cough ?Acute on chronic.  Patient has reportedly had this intermittent cough for which she takes Mucinex at home.  Lung sounds on exam appear to be clear. ?-Incentive spirometry ?-Continue Mucinex ?-Chest x-ray which noted minimal bibasilar atelectasis  ?-Incentive spirometry, flutter valve. ?-On antibiotics secondary to UTI. ? ? ? ? ?  ? ? ?DVT prophylaxis: Lovenox ?Code Status: Full ?Family Communication: Updated patient.  No family at bedside.   ?Disposition: CIR when bed available. ? ?Status is: Inpatient ?Remains inpatient appropriate because: Severity of illness. ?  ?Consultants:  ?Neurology: Dr.Khaliqdina 05/21/2021 ? ?Procedures:  ?CT head 05/21/2021 ?CT angiogram head and neck 05/21/2021 ?2D echo with bubble study 05/21/2021 ?MRI brain 05/21/2021 ?2D echo with bubble study 05/23/2021 ?TEE scheduled for 05/24/2021 ?Lower extremity Dopplers 05/23/2021  ?TEE per Dr. Radford Pax cardiology 05/24/2021 ? ? ?Antimicrobials:  ?IV Rocephin 05/22/2021>>>> 05/25/2021 ?Keflex 05/26/2021>> ? ? ?Subjective: ?Patient sitting up in bed.  Still with dysarthric speech.  Still with significant left lower extremity weakness.  Slight improvement with left upper extremity weakness.  No chest pain.  No shortness of breath.  Tolerating current diet.   ? ?Objective: ?Vitals:  ? 05/25/21 0749 05/25/21 1211 05/25/21 1214 05/25/21 1646  ?BP: (!) 197/106 (!) 163/103 (!) 157/106 (!) 174/99  ?Pulse: 100 81  93  ?Resp: 20 14  18   ?Temp: 98.4 ?F (36.9 ?C) 98.2 ?F (36.8 ?C)  98.3 ?F (  36.8 ?C)  ?TempSrc: Oral Oral  Oral  ?SpO2: 97% 95%  95%  ?Weight:      ?Height:      ? ? ?Intake/Output Summary (Last 24 hours) at 05/25/2021 1822 ?Last data filed at 05/25/2021 1700 ?Gross per 24 hour  ?Intake 480 ml  ?Output 1400 ml  ?Net -920 ml  ? ?Filed Weights  ? 05/21/21 0800 05/24/21 0735  ?Weight: 111.4 kg 111.4 kg  ? ? ?Examination: ? ?General exam: NAD. ?Respiratory system: CTA B.  No  wheezes, no crackles, no rhonchi.  Fair air movement.  Normal respiratory effort.  ?Cardiovascular system: Regular rate rhythm no murmurs rubs or gallops.  No JVD.  No lower extremity edema.  ?Gastrointestinal s

## 2021-05-25 NOTE — Plan of Care (Signed)
?  Problem: Education: ?Goal: Knowledge of secondary prevention will improve (SELECT ALL) ?Outcome: Progressing ?Goal: Knowledge of patient specific risk factors will improve (INDIVIDUALIZE FOR PATIENT) ?Outcome: Progressing ?Goal: Individualized Educational Video(s) ?Outcome: Progressing ?  ?Problem: Coping: ?Goal: Will verbalize positive feelings about self ?Outcome: Progressing ?Goal: Will identify appropriate support needs ?Outcome: Progressing ?  ?Problem: Self-Care: ?Goal: Verbalization of feelings and concerns over difficulty with self-care will improve ?Outcome: Progressing ?Goal: Ability to communicate needs accurately will improve ?Outcome: Progressing ?  ?Problem: Nutrition: ?Goal: Risk of aspiration will decrease ?Outcome: Progressing ?  ?Problem: Education: ?Goal: Knowledge of General Education information will improve ?Description: Including pain rating scale, medication(s)/side effects and non-pharmacologic comfort measures ?Outcome: Progressing ?  ?Problem: Health Behavior/Discharge Planning: ?Goal: Ability to manage health-related needs will improve ?Outcome: Progressing ?  ?Problem: Clinical Measurements: ?Goal: Ability to maintain clinical measurements within normal limits will improve ?Outcome: Progressing ?Goal: Will remain free from infection ?Outcome: Progressing ?Goal: Diagnostic test results will improve ?Outcome: Progressing ?Goal: Respiratory complications will improve ?Outcome: Progressing ?Goal: Cardiovascular complication will be avoided ?Outcome: Progressing ?  ?Problem: Activity: ?Goal: Risk for activity intolerance will decrease ?Outcome: Progressing ?  ?Problem: Coping: ?Goal: Level of anxiety will decrease ?Outcome: Progressing ?  ?Problem: Elimination: ?Goal: Will not experience complications related to bowel motility ?Outcome: Progressing ?Goal: Will not experience complications related to urinary retention ?Outcome: Progressing ?  ?Problem: Safety: ?Goal: Ability to remain free  from injury will improve ?Outcome: Progressing ?  ?Problem: Skin Integrity: ?Goal: Risk for impaired skin integrity will decrease ?Outcome: Progressing ?  ?

## 2021-05-25 NOTE — Plan of Care (Signed)
Pt is alert oriented x 4, no distress noted. Head of the bed elevated. Pt has slurred speech. Pt c/o throat pain, prn chloraseptic spray given. Pt has been turned and repositioned. Prn anxiety medication given prior to sleep, effective results. Call button within reach.  ? ? ? ?Problem: Education: ?Goal: Knowledge of secondary prevention will improve (SELECT ALL) ?Outcome: Progressing ?Goal: Knowledge of patient specific risk factors will improve (INDIVIDUALIZE FOR PATIENT) ?Outcome: Progressing ?Goal: Individualized Educational Video(s) ?Outcome: Progressing ?  ?Problem: Coping: ?Goal: Will verbalize positive feelings about self ?Outcome: Progressing ?Goal: Will identify appropriate support needs ?Outcome: Progressing ?  ?Problem: Self-Care: ?Goal: Verbalization of feelings and concerns over difficulty with self-care will improve ?Outcome: Progressing ?Goal: Ability to communicate needs accurately will improve ?Outcome: Progressing ?  ?Problem: Nutrition: ?Goal: Risk of aspiration will decrease ?Outcome: Progressing ?  ?Problem: Education: ?Goal: Knowledge of General Education information will improve ?Description: Including pain rating scale, medication(s)/side effects and non-pharmacologic comfort measures ?Outcome: Progressing ?  ?Problem: Health Behavior/Discharge Planning: ?Goal: Ability to manage health-related needs will improve ?Outcome: Progressing ?  ?Problem: Clinical Measurements: ?Goal: Ability to maintain clinical measurements within normal limits will improve ?Outcome: Progressing ?Goal: Will remain free from infection ?Outcome: Progressing ?Goal: Diagnostic test results will improve ?Outcome: Progressing ?Goal: Respiratory complications will improve ?Outcome: Progressing ?Goal: Cardiovascular complication will be avoided ?Outcome: Progressing ?  ?Problem: Activity: ?Goal: Risk for activity intolerance will decrease ?Outcome: Progressing ?  ?Problem: Coping: ?Goal: Level of anxiety will  decrease ?Outcome: Progressing ?  ?Problem: Elimination: ?Goal: Will not experience complications related to bowel motility ?Outcome: Progressing ?Goal: Will not experience complications related to urinary retention ?Outcome: Progressing ?  ?Problem: Safety: ?Goal: Ability to remain free from injury will improve ?Outcome: Progressing ?  ?Problem: Skin Integrity: ?Goal: Risk for impaired skin integrity will decrease ?Outcome: Progressing ?  ?

## 2021-05-26 DIAGNOSIS — N390 Urinary tract infection, site not specified: Secondary | ICD-10-CM

## 2021-05-26 DIAGNOSIS — B9620 Unspecified Escherichia coli [E. coli] as the cause of diseases classified elsewhere: Secondary | ICD-10-CM

## 2021-05-26 LAB — BASIC METABOLIC PANEL
Anion gap: 7 (ref 5–15)
BUN: 15 mg/dL (ref 6–20)
CO2: 24 mmol/L (ref 22–32)
Calcium: 9.4 mg/dL (ref 8.9–10.3)
Chloride: 111 mmol/L (ref 98–111)
Creatinine, Ser: 1.06 mg/dL — ABNORMAL HIGH (ref 0.44–1.00)
GFR, Estimated: >60 mL/min (ref >60)
Glucose, Bld: 91 mg/dL (ref 70–99)
Potassium: 3.5 mmol/L (ref 3.5–5.1)
Sodium: 142 mmol/L (ref 135–145)

## 2021-05-26 MED ORDER — HYDROCHLOROTHIAZIDE 12.5 MG PO TABS
12.5000 mg | ORAL_TABLET | Freq: Every day | ORAL | Status: DC
  Administered 2021-05-26 – 2021-05-27 (×2): 12.5 mg via ORAL
  Filled 2021-05-26 (×2): qty 1

## 2021-05-26 MED ORDER — METOPROLOL TARTRATE 50 MG PO TABS
100.0000 mg | ORAL_TABLET | Freq: Two times a day (BID) | ORAL | Status: DC
  Administered 2021-05-26 – 2021-05-27 (×3): 100 mg via ORAL
  Filled 2021-05-26 (×3): qty 2

## 2021-05-26 MED ORDER — POTASSIUM CHLORIDE CRYS ER 20 MEQ PO TBCR
20.0000 meq | EXTENDED_RELEASE_TABLET | Freq: Every day | ORAL | Status: DC
  Administered 2021-05-26 – 2021-05-27 (×2): 20 meq via ORAL
  Filled 2021-05-26 (×2): qty 1

## 2021-05-26 MED ORDER — HYDRALAZINE HCL 25 MG PO TABS
25.0000 mg | ORAL_TABLET | Freq: Three times a day (TID) | ORAL | Status: DC
  Administered 2021-05-26 – 2021-05-27 (×5): 25 mg via ORAL
  Filled 2021-05-26 (×5): qty 1

## 2021-05-26 NOTE — Plan of Care (Signed)
Pt is alert oriented x 4. Pt soft spoken, slurred speech. Pt c/o generalized pain, arm after taking BP, and throat pain. PRN tylenol given and PRN chloraseptic spray given.  Pt has been turned and repositioned, perineal area cleansed and new purewick applied. Pt currently has sleepy study device on. Pt resting no distress noted.  ? ? ?Problem: Education: ?Goal: Knowledge of secondary prevention will improve (SELECT ALL) ?Outcome: Progressing ?Goal: Knowledge of patient specific risk factors will improve (INDIVIDUALIZE FOR PATIENT) ?Outcome: Progressing ?Goal: Individualized Educational Video(s) ?Outcome: Progressing ?  ?Problem: Coping: ?Goal: Will verbalize positive feelings about self ?Outcome: Progressing ?Goal: Will identify appropriate support needs ?Outcome: Progressing ?  ?Problem: Self-Care: ?Goal: Verbalization of feelings and concerns over difficulty with self-care will improve ?Outcome: Progressing ?Goal: Ability to communicate needs accurately will improve ?Outcome: Progressing ?  ?Problem: Nutrition: ?Goal: Risk of aspiration will decrease ?Outcome: Progressing ?  ?Problem: Education: ?Goal: Knowledge of General Education information will improve ?Description: Including pain rating scale, medication(s)/side effects and non-pharmacologic comfort measures ?Outcome: Progressing ?  ?Problem: Health Behavior/Discharge Planning: ?Goal: Ability to manage health-related needs will improve ?Outcome: Progressing ?  ?Problem: Clinical Measurements: ?Goal: Ability to maintain clinical measurements within normal limits will improve ?Outcome: Progressing ?Goal: Will remain free from infection ?Outcome: Progressing ?Goal: Diagnostic test results will improve ?Outcome: Progressing ?Goal: Respiratory complications will improve ?Outcome: Progressing ?Goal: Cardiovascular complication will be avoided ?Outcome: Progressing ?  ?Problem: Activity: ?Goal: Risk for activity intolerance will decrease ?Outcome: Progressing ?   ?Problem: Coping: ?Goal: Level of anxiety will decrease ?Outcome: Progressing ?  ?Problem: Elimination: ?Goal: Will not experience complications related to bowel motility ?Outcome: Progressing ?Goal: Will not experience complications related to urinary retention ?Outcome: Progressing ?  ?Problem: Safety: ?Goal: Ability to remain free from injury will improve ?Outcome: Progressing ?  ?Problem: Skin Integrity: ?Goal: Risk for impaired skin integrity will decrease ?Outcome: Progressing ?  ?

## 2021-05-26 NOTE — Progress Notes (Signed)
?PROGRESS NOTE ? ? ? ?Gwendolyn Morales  [id number] DOB: 06/28/1965 DOA: 05/21/2021 ?PCP: [name], MD  ? ? ?Chief Complaint  ?Patient presents with  ? Code Stroke  ? ? 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  Patient also noted to have some dysarthric speech that had worsened.  Normally patient noted to ambulate with the use of a walker at baseline since her prior stroke.  Patient brought to the ED as a code stroke.  On presentation patient noted to have a blood pressure of 204/148 with normal O2.  CT head done with signs of subacute right PCA stroke and chronic right frontal stroke.  Patient noted to not be a candidate for thrombolytics due to prior history of intracranial hemorrhage.  CT angiogram head and neck done was negative for LVO.  MRI significant for acute small vessel deep white matter infarcts adjacent to the posterior body of the right lateral ventricle, anterior body of the right lateral ventricle, posterior body of the left lateral ventricle and related to old infarct of the right posterior medial temporal lobe and occipital lobe.  Urinalysis also concerning for UTI.  Patient admitted for stroke work-up.  Neurology consulted and following  ? ? ?Assessment & Plan: ? Principal Problem: ?  CVA (cerebral vascular accident) ([place]) ?Active Problems: ?  Hypertensive emergency ?  Hypokalemia ?  Productive cough ?  Obesity (BMI 30-39.9) ?  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  Stage 3a chronic kidney disease (CKD) ([place]) ?  History of CVA with residual deficit ?  E-coli UTI ? ? ? ?Assessment and Plan: ?* CVA (cerebral vascular accident) ([place]) ?Patient presented with complaints of left-sided weakness upon awakening the morning of admission.   ?-CT with signs of a subacute right PCA stroke  with chronic right frontal stroke.  Patient was not a thrombolytic candidate due to prior history of intracranial hemorrhage.  ?- CTA of the head and neck did not note any large vessel occlusion. ?-Patient seen in consultation by neurology who recommended admission for stroke work-up. ?-MRI brain done with chronic small vessel disease, acute small vessel deep white matter infarctions adjacent to the posterior body of the right lateral ventricle, anterior body of the right lateral ventricle, posterior body of the left lateral ventricle, late subacute infarct of the right posterior medial temporal lobe and occipital lobe. ?- ?- echocardiogram -limited study due to patient body habitus.  EF noted to be 60 to 65% with indeterminate diastolic parameters.  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  Message sent to [name] in order to help arrange TEE which.  ?-TCD bubble study done 05/23/2021 noted to be positive per neurology and as such lower extremity Dopplers was ordered, which was negative for DVT. 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  Grade 2 atherosclerotic plaque in the thoracic and descending aorta.  No right-to-left shunt or PFO noted. ?-LDL at 118. ?-Hemoglobin A1c 5.4. ?-PT/OT/ST. ?-Permissive hypertension for initial 24 hours and slowly normalize blood pressure in the next 5 to 7 days. ?-Increase Norvasc to 10 mg daily.  Increase Lopressor  to 50 mg twice daily.   ?-Continue statin. ?-Patient being followed by the stroke team who recommended aspirin and Plavix x3 weeks, and then plavix alone. ?-Per neurology concern for sleep apnea considering participation in sleep stroke prevention study which is being  managed by neurology. ?-Neurology also recommending 30-day event monitor postdischarge. ?-Appreciate neurology consultative services, will follow-up any further recommendations. ?-Awaiting CIR placement. ? ? ?Hypertensive emergency ?-On admission blood pressure was elevated up to 204/148.  Patient had not been taking any of her home blood pressure medications since possibly August 2022. ?- Previous blood pressure regimen included amlodipine 10 mg daily, hydralazine 50 mg 3 times daily, HCTZ 12.5 mg daily, and metoprolol 100 mg twice daily. ?-Allow for permissive hypertension given acute stroke and slowly normalize blood pressure in the next 5 to 7 days secondary to acute CVA. ?-Increased Norvasc to 10 mg daily, increase Lopressor to 100 mg twice daily.  ?-Resume home regimen HCTZ 12.5 mg daily. ?-Start hydralazine 25 mg p.o. 3 times daily. ?-Hydralazine IV as needed for systolic blood pressures greater than [id number] or diastolic blood pressure greater than 110 ? ?Hypokalemia ?Acute. ?  Initial potassium 3.4. ?-Potassium at 3.5 today. ?-Resume home regimen oral potassium supplementation. ? ?E-coli UTI ?- Urine cultures with > 100,000 colonies of E. Coli which is pansensitive. ?-Transitioned from IV Rocephin to oral cephalexin to complete a 5-day course of treatment.   ? ?Stage 3a chronic kidney disease (CKD) ([place]) ?CKD stage IIIa.  ? Creatinine 1.21 with BUN 10.  Baseline creatinine previously had been around 0.9 back in 2019 per records on Care Everywhere.  This may constitute an acute kidney injury. ?-Urinalysis concerning for UTI with large leukocytes, positive nitrite, many bacteria, WBC > 50.  Protein of 100. ?-Renal function trending down with gentle hydration and currently at 1.06. ?-IV fluids have been discontinued. ? 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 ?-PT/OT. ? ?Obesity (BMI 30-39.9) ?BMI initially calculated at 37.3 kg/m?. ?-Lifestyle modification. ?-Outpatient follow-up with PCP. ? ?Productive cough ?Acute on chronic.  Patient has reportedly had this intermittent cough for which she takes Mucinex at home.  Lung sounds on exam appear to be clear. ?-Incentive spirometry ?-Continue Mucinex ?-Chest x-ray which noted minimal bibasilar atelectasis  ?-Incentive spirometry, flutter valve. ?-Completed 5-day course of antibiotics for UTI today.  ? ? ? ? ?  ? ? ?DVT prophylaxis: Lovenox ?Code Status: Full ?Family Communication: Updated patient.  No family at bedside.   ?Disposition: CIR when bed available. ? ?Status is: Inpatient ?Remains inpatient appropriate because: Severity of illness. ?  ?Consultants:  ?Neurology: Dr.Khaliqdina 05/21/2021 ? ?Procedures:  ?CT head 05/21/2021 ?CT angiogram head and neck 05/21/2021 ?2D echo with bubble study 05/21/2021 ?MRI brain 05/21/2021 ?2D echo with bubble study 05/23/2021 ?TEE scheduled for 05/24/2021 ?Lower extremity Dopplers 05/23/2021  ?TEE per Dr. Radford Pax cardiology 05/24/2021 ? ? ?Antimicrobials:  ?IV Rocephin 05/22/2021>>>> 05/25/2021 ?Keflex 05/26/2021>> 05/27/2021 ? ? ?Subjective: ?Sitting up in bed.  Still with dysarthric speech.  Some improvement with left upper extremity weakness.  Still with significant left lower extremity weakness.  No chest pain.  No shortness of breath.  Overall feeling better.   ? ?Objective: ?Vitals:  ? 05/26/21 0729 05/26/21 0914 05/26/21 1107 05/26/21 1514  ?BP: (!) 246/101 (!) 174/92 (!) 157/100 (!) 143/86  ?Pulse: 88  87 80  ?Resp:  18  18 17   ?Temp: (!) 97.5 ?F (36.4 ?C)  97.8 ?F (36.6 ?C) 97.7 ?F (36.5 ?C)  ?TempSrc: Oral  Oral Oral  ?SpO2: 98%  97% 98%  ?Weight:      ?Height:      ? ? ?Intake/Output Summary (Last 24 hours) at 05/26/2021 1822 ?Last data filed at 05/26/2021 1807 ?Gross per 24 hour  ?Intake --  ?Output 1400 ml  ?Net -1400 ml  ? ?Filed Weights  ? 05/21/21 0800  05/24/21 0735  ?Weight: 111.4 kg 111.4 kg  ? ? ?Examination: ? ?General exam: NAD. ?Respiratory system: Lungs clear to auscultation bilaterally.  No wheezes, no crackles, no rhonchi.  Fair air movement.  Spea

## 2021-05-27 ENCOUNTER — Other Ambulatory Visit: Payer: Self-pay

## 2021-05-27 ENCOUNTER — Encounter (HOSPITAL_COMMUNITY): Payer: Self-pay | Admitting: Cardiology

## 2021-05-27 ENCOUNTER — Inpatient Hospital Stay (HOSPITAL_COMMUNITY)
Admission: RE | Admit: 2021-05-27 | Discharge: 2021-06-18 | DRG: 057 | Disposition: A | Discharge status: Home / Self Care | Payer: Medicaid Other | Source: Intra-hospital | Attending: Physical Medicine and Rehabilitation | Admitting: Physical Medicine and Rehabilitation

## 2021-05-27 DIAGNOSIS — I69311 Memory deficit following cerebral infarction: Secondary | ICD-10-CM

## 2021-05-27 DIAGNOSIS — Z79899 Other long term (current) drug therapy: Secondary | ICD-10-CM | POA: Diagnosis not present

## 2021-05-27 DIAGNOSIS — Z6833 Body mass index (BMI) 33.0-33.9, adult: Secondary | ICD-10-CM | POA: Diagnosis not present

## 2021-05-27 DIAGNOSIS — R531 Weakness: Secondary | ICD-10-CM | POA: Diagnosis present

## 2021-05-27 DIAGNOSIS — I129 Hypertensive chronic kidney disease with stage 1 through stage 4 chronic kidney disease, or unspecified chronic kidney disease: Secondary | ICD-10-CM | POA: Diagnosis present

## 2021-05-27 DIAGNOSIS — M25512 Pain in left shoulder: Secondary | ICD-10-CM | POA: Diagnosis present

## 2021-05-27 DIAGNOSIS — I69322 Dysarthria following cerebral infarction: Secondary | ICD-10-CM | POA: Diagnosis not present

## 2021-05-27 DIAGNOSIS — T502X5A Adverse effect of carbonic-anhydrase inhibitors, benzothiadiazides and other diuretics, initial encounter: Secondary | ICD-10-CM | POA: Diagnosis not present

## 2021-05-27 DIAGNOSIS — Z7982 Long term (current) use of aspirin: Secondary | ICD-10-CM | POA: Diagnosis not present

## 2021-05-27 DIAGNOSIS — F4323 Adjustment disorder with mixed anxiety and depressed mood: Secondary | ICD-10-CM | POA: Diagnosis not present

## 2021-05-27 DIAGNOSIS — R001 Bradycardia, unspecified: Secondary | ICD-10-CM | POA: Diagnosis not present

## 2021-05-27 DIAGNOSIS — I69354 Hemiplegia and hemiparesis following cerebral infarction affecting left non-dominant side: Principal | ICD-10-CM

## 2021-05-27 DIAGNOSIS — Z833 Family history of diabetes mellitus: Secondary | ICD-10-CM | POA: Diagnosis not present

## 2021-05-27 DIAGNOSIS — K592 Neurogenic bowel, not elsewhere classified: Secondary | ICD-10-CM | POA: Diagnosis not present

## 2021-05-27 DIAGNOSIS — R053 Chronic cough: Secondary | ICD-10-CM | POA: Diagnosis present

## 2021-05-27 DIAGNOSIS — I6931 Attention and concentration deficit following cerebral infarction: Secondary | ICD-10-CM

## 2021-05-27 DIAGNOSIS — N390 Urinary tract infection, site not specified: Secondary | ICD-10-CM | POA: Diagnosis present

## 2021-05-27 DIAGNOSIS — Z8249 Family history of ischemic heart disease and other diseases of the circulatory system: Secondary | ICD-10-CM

## 2021-05-27 DIAGNOSIS — N183 Chronic kidney disease, stage 3 unspecified: Secondary | ICD-10-CM | POA: Diagnosis present

## 2021-05-27 DIAGNOSIS — M1711 Unilateral primary osteoarthritis, right knee: Secondary | ICD-10-CM | POA: Diagnosis present

## 2021-05-27 DIAGNOSIS — N1831 Chronic kidney disease, stage 3a: Secondary | ICD-10-CM | POA: Diagnosis not present

## 2021-05-27 DIAGNOSIS — F419 Anxiety disorder, unspecified: Secondary | ICD-10-CM | POA: Diagnosis present

## 2021-05-27 DIAGNOSIS — I3481 Nonrheumatic mitral (valve) annulus calcification: Secondary | ICD-10-CM | POA: Diagnosis present

## 2021-05-27 DIAGNOSIS — I639 Cerebral infarction, unspecified: Secondary | ICD-10-CM | POA: Diagnosis present

## 2021-05-27 DIAGNOSIS — M25511 Pain in right shoulder: Secondary | ICD-10-CM | POA: Diagnosis not present

## 2021-05-27 DIAGNOSIS — G510 Bell's palsy: Secondary | ICD-10-CM | POA: Diagnosis present

## 2021-05-27 DIAGNOSIS — E669 Obesity, unspecified: Secondary | ICD-10-CM | POA: Diagnosis present

## 2021-05-27 DIAGNOSIS — M25561 Pain in right knee: Secondary | ICD-10-CM | POA: Diagnosis present

## 2021-05-27 DIAGNOSIS — I69314 Frontal lobe and executive function deficit following cerebral infarction: Secondary | ICD-10-CM | POA: Diagnosis present

## 2021-05-27 DIAGNOSIS — E785 Hyperlipidemia, unspecified: Secondary | ICD-10-CM | POA: Diagnosis present

## 2021-05-27 DIAGNOSIS — E876 Hypokalemia: Secondary | ICD-10-CM | POA: Diagnosis present

## 2021-05-27 DIAGNOSIS — R490 Dysphonia: Secondary | ICD-10-CM | POA: Diagnosis not present

## 2021-05-27 DIAGNOSIS — K5900 Constipation, unspecified: Secondary | ICD-10-CM | POA: Diagnosis present

## 2021-05-27 DIAGNOSIS — I10 Essential (primary) hypertension: Secondary | ICD-10-CM | POA: Diagnosis not present

## 2021-05-27 DIAGNOSIS — B9620 Unspecified Escherichia coli [E. coli] as the cause of diseases classified elsewhere: Secondary | ICD-10-CM | POA: Diagnosis present

## 2021-05-27 DIAGNOSIS — M545 Low back pain, unspecified: Secondary | ICD-10-CM | POA: Diagnosis present

## 2021-05-27 LAB — CBC
HCT: 48.8 % — ABNORMAL HIGH (ref 36.0–46.0)
Hemoglobin: 15.7 g/dL — ABNORMAL HIGH (ref 12.0–15.0)
MCH: 26.4 pg (ref 26.0–34.0)
MCHC: 32.2 g/dL (ref 30.0–36.0)
MCV: 82 fL (ref 80.0–100.0)
Platelets: 372 10*3/uL (ref 150–400)
RBC: 5.95 MIL/uL — ABNORMAL HIGH (ref 3.87–5.11)
RDW: 16.2 % — ABNORMAL HIGH (ref 11.5–15.5)
WBC: 9.1 10*3/uL (ref 4.0–10.5)
nRBC: 0 % (ref 0.0–0.2)

## 2021-05-27 LAB — BASIC METABOLIC PANEL
Anion gap: 6 (ref 5–15)
Anion gap: 8 (ref 5–15)
BUN: 15 mg/dL (ref 6–20)
BUN: 19 mg/dL (ref 6–20)
CO2: 23 mmol/L (ref 22–32)
CO2: 25 mmol/L (ref 22–32)
Calcium: 9.8 mg/dL (ref 8.9–10.3)
Calcium: 9.9 mg/dL (ref 8.9–10.3)
Chloride: 112 mmol/L — ABNORMAL HIGH (ref 98–111)
Chloride: 108 mmol/L (ref 98–111)
Creatinine, Ser: 1 mg/dL (ref 0.44–1.00)
Creatinine, Ser: 1.03 mg/dL — ABNORMAL HIGH (ref 0.44–1.00)
GFR, Estimated: >60 mL/min (ref >60)
GFR, Estimated: >60 mL/min (ref >60)
Glucose, Bld: 94 mg/dL (ref 70–99)
Glucose, Bld: 104 mg/dL — ABNORMAL HIGH (ref 70–99)
Potassium: 3.6 mmol/L (ref 3.5–5.1)
Potassium: 3.6 mmol/L (ref 3.5–5.1)
Sodium: 141 mmol/L (ref 135–145)
Sodium: 141 mmol/L (ref 135–145)

## 2021-05-27 LAB — PROTHROMBIN GENE MUTATION

## 2021-05-27 LAB — MAGNESIUM: Magnesium: 2 mg/dL (ref 1.7–2.4)

## 2021-05-27 LAB — FACTOR 5 LEIDEN

## 2021-05-27 MED ORDER — CLOPIDOGREL BISULFATE 75 MG PO TABS
75.0000 mg | ORAL_TABLET | Freq: Every day | ORAL | Status: AC
  Administered 2021-05-28 – 2021-06-11 (×15): 75 mg via ORAL
  Filled 2021-05-27 (×15): qty 1

## 2021-05-27 MED ORDER — FLEET ENEMA 7-19 GM/118ML RE ENEM: 1.0000 | ENEMA | Freq: Once | RECTAL | Status: DC | PRN

## 2021-05-27 MED ORDER — TRAZODONE HCL 50 MG PO TABS
25.0000 mg | ORAL_TABLET | Freq: Every evening | ORAL | Status: DC | PRN
  Administered 2021-06-15: 50 mg via ORAL
  Filled 2021-05-27: qty 1

## 2021-05-27 MED ORDER — AMLODIPINE BESYLATE 10 MG PO TABS
10.0000 mg | ORAL_TABLET | Freq: Every day | ORAL | Status: DC
  Administered 2021-05-28 – 2021-06-18 (×22): 10 mg via ORAL
  Filled 2021-05-27 (×22): qty 1

## 2021-05-27 MED ORDER — ALUM & MAG HYDROXIDE-SIMETH 200-200-20 MG/5ML PO SUSP: 30.0000 mL | ORAL | Status: DC | PRN

## 2021-05-27 MED ORDER — ASPIRIN EC 81 MG PO TBEC
81.0000 mg | DELAYED_RELEASE_TABLET | Freq: Every day | ORAL | Status: DC
  Administered 2021-05-28 – 2021-06-18 (×22): 81 mg via ORAL
  Filled 2021-05-27 (×22): qty 1

## 2021-05-27 MED ORDER — POTASSIUM CHLORIDE CRYS ER 20 MEQ PO TBCR
20.0000 meq | EXTENDED_RELEASE_TABLET | Freq: Every day | ORAL | Status: DC
  Administered 2021-05-28 – 2021-06-18 (×22): 20 meq via ORAL
  Filled 2021-05-27 (×23): qty 1

## 2021-05-27 MED ORDER — HYDRALAZINE HCL 25 MG PO TABS
25.0000 mg | ORAL_TABLET | Freq: Three times a day (TID) | ORAL | Status: DC
  Administered 2021-05-27 – 2021-06-18 (×67): 25 mg via ORAL
  Filled 2021-05-27 (×68): qty 1

## 2021-05-27 MED ORDER — METOPROLOL TARTRATE 50 MG PO TABS
100.0000 mg | ORAL_TABLET | Freq: Two times a day (BID) | ORAL | Status: DC
  Administered 2021-05-27 – 2021-06-03 (×14): 100 mg via ORAL
  Filled 2021-05-27 (×14): qty 2

## 2021-05-27 MED ORDER — GUAIFENESIN-DM 100-10 MG/5ML PO SYRP
5.0000 mL | ORAL_SOLUTION | Freq: Four times a day (QID) | ORAL | Status: DC | PRN
  Administered 2021-06-16: 10 mL via ORAL
  Filled 2021-05-27: qty 10

## 2021-05-27 MED ORDER — PROCHLORPERAZINE 25 MG RE SUPP: 12.5000 mg | Freq: Four times a day (QID) | RECTAL | Status: DC | PRN

## 2021-05-27 MED ORDER — ENOXAPARIN SODIUM 40 MG/0.4ML IJ SOSY
40.0000 mg | PREFILLED_SYRINGE | INTRAMUSCULAR | Status: DC
  Administered 2021-05-28 – 2021-06-18 (×22): 40 mg via SUBCUTANEOUS
  Filled 2021-05-27 (×22): qty 0.4

## 2021-05-27 MED ORDER — HYDROCHLOROTHIAZIDE 12.5 MG PO TABS
12.5000 mg | ORAL_TABLET | Freq: Every day | ORAL | Status: DC
  Administered 2021-05-28 – 2021-05-31 (×4): 12.5 mg via ORAL
  Filled 2021-05-27 (×4): qty 1

## 2021-05-27 MED ORDER — POLYETHYLENE GLYCOL 3350 17 G PO PACK
17.0000 g | PACK | Freq: Two times a day (BID) | ORAL | Status: DC
  Administered 2021-05-28 – 2021-06-15 (×17): 17 g via ORAL
  Filled 2021-05-27 (×39): qty 1

## 2021-05-27 MED ORDER — ATORVASTATIN CALCIUM 80 MG PO TABS
80.0000 mg | ORAL_TABLET | Freq: Every day | ORAL | Status: DC
  Administered 2021-05-28 – 2021-06-18 (×22): 80 mg via ORAL
  Filled 2021-05-27 (×22): qty 1

## 2021-05-27 MED ORDER — ACETAMINOPHEN 325 MG PO TABS: 325.0000 mg | ORAL_TABLET | ORAL | Status: DC | PRN

## 2021-05-27 MED ORDER — EXERCISE FOR HEART AND HEALTH BOOK
Freq: Once | Status: AC
  Filled 2021-05-27: qty 1

## 2021-05-27 MED ORDER — HYDROXYZINE HCL 25 MG PO TABS
25.0000 mg | ORAL_TABLET | Freq: Three times a day (TID) | ORAL | Status: DC | PRN
  Administered 2021-05-28 – 2021-05-30 (×2): 25 mg via ORAL
  Filled 2021-05-27 (×2): qty 1

## 2021-05-27 MED ORDER — DIPHENHYDRAMINE HCL 12.5 MG/5ML PO ELIX: 12.5000 mg | ORAL_SOLUTION | Freq: Four times a day (QID) | ORAL | Status: DC | PRN

## 2021-05-27 MED ORDER — BLOOD PRESSURE CONTROL BOOK
Freq: Once | Status: AC
  Filled 2021-05-27: qty 1

## 2021-05-27 MED ORDER — GUAIFENESIN ER 600 MG PO TB12
1200.0000 mg | ORAL_TABLET | Freq: Two times a day (BID) | ORAL | Status: DC
  Administered 2021-05-27 – 2021-06-03 (×14): 1200 mg via ORAL
  Filled 2021-05-27 (×15): qty 2

## 2021-05-27 MED ORDER — PROCHLORPERAZINE EDISYLATE 10 MG/2ML IJ SOLN: 5.0000 mg | Freq: Four times a day (QID) | INTRAMUSCULAR | Status: DC | PRN

## 2021-05-27 MED ORDER — BISACODYL 10 MG RE SUPP: 10.0000 mg | Freq: Every day | RECTAL | Status: DC | PRN

## 2021-05-27 MED ORDER — PROCHLORPERAZINE MALEATE 5 MG PO TABS: 5.0000 mg | ORAL_TABLET | Freq: Four times a day (QID) | ORAL | Status: DC | PRN

## 2021-05-27 NOTE — Progress Notes (Signed)
Physical Therapy Treatment ?Patient Details ?Name: Gwendolyn Morales ?MRN: [id number] ?DOB: 05/14/1965 ?Today's Date: 05/27/2021 ? ? ?History of Present Illness 56 y.o. female presents with complaints of left-sided weakness. MRI + Advanced chronic small-vessel ischemic changes; acute small-vessel deep white matter infarctions adjacent to the posterior body of the right lateral ventricle, the anterior body of the right lateral ventricle in the posterior body of the left  lateral ventricle; late subacutte to old infarct of the R posteromedial temporal and occipital lobes. 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 ?  ?PT Comments  ? ? Progressing slowly toward goals.  Emphasis of transition to EOB and sit to stands into the STEDY x4 with ultimate transfer to the chair with STEDY   ?Recommendations for follow up therapy are one component of a multi-disciplinary discharge planning process, led by the attending physician.  Recommendations may be updated based on patient status, additional functional criteria and insurance authorization. ? ?Follow Up Recommendations ? Acute inpatient rehab (3hours/day) ?  ?  ?Assistance Recommended at Discharge Frequent or constant Supervision/Assistance  ?Patient can return home with the following A lot of help with walking and/or transfers;A little help with bathing/dressing/bathroom;Assistance with cooking/housework;Direct supervision/assist for medications management;Direct supervision/assist for financial management;Assist for transportation;Help with stairs or ramp for entrance ?  ?Equipment Recommendations ? Other (comment) (TBD after AIR)  ?  ?Recommendations for Other Services Rehab consult ? ? ?  ?Precautions / Restrictions Precautions ?Precautions: Fall ?Restrictions ?Weight Bearing Restrictions: No  ?  ? ?Mobility ? Bed Mobility ?Overal bed mobility: Needs Assistance ?Bed Mobility: Supine to Sit ?  ?  ?Supine to sit:  Mod assist, +2 for physical assistance ?  ?  ?General bed mobility comments: scooting to EOB with mod/max, assisting through the padding ?  ? ?Transfers ?Overall transfer level: Needs assistance ?Equipment used: Ambulation equipment used ?  ?Sit to Stand: Max assist, +2 physical assistance ?  ?  ?  ?  ?  ?General transfer comment: sit to stand x4 into STEDY, transfer to the chair with STEDY ?Transfer via Lift Equipment: Stedy ? ?Ambulation/Gait ?  ?  ?  ?  ?  ?  ?  ?  ? ? ?Stairs ?  ?  ?  ?  ?  ? ? ?Wheelchair Mobility ?  ? ?Modified Rankin (Stroke Patients Only) ?Modified Rankin (Stroke Patients Only) ?Pre-Morbid Rankin Score: Moderate disability ?Modified Rankin: Severe disability ? ? ?  ?Balance Overall balance assessment: Needs assistance ?Sitting-balance support: No upper extremity supported, Feet supported ?Sitting balance-Leahy Scale: Fair ?Sitting balance - Comments: but using R>L UE consistently in the STEDY due to fears of falling (and fear of height coming into the picture in the STEDY) ?  ?Standing balance support: During functional activity, Bilateral upper extremity supported ?Standing balance-Leahy Scale: Poor ?Standing balance comment: bed pads used to power up into standing with 2 person hand held assist and with standing with Stedy ?  ?  ?  ?  ?  ?  ?  ?  ?  ?  ?  ?  ? ?  ?Cognition Arousal/Alertness: Awake/alert ?Behavior During Therapy: [facility] for tasks assessed/performed ?Overall Cognitive Status: Within Functional Limits for tasks assessed (NT formally) ?  ?  ?  ?  ?  ?  ?  ?  ?  ?  ?  ?  ?  ?  ?Awareness: Emergent ?Problem Solving: Slow processing ?  ?  ?  ? ?  ?  Exercises Other Exercises ?Other Exercises: hip/knee flexion/ext bil with graded assist/resistance x10 reps. ? ?  ?General Comments   ?  ?  ? ?Pertinent Vitals/Pain Pain Assessment ?Pain Assessment: Faces ?Faces Pain Scale: Hurts a little bit ?Pain Location: L arm , shoulder ?Pain Descriptors / Indicators: Sore ?Pain Intervention(s):  Monitored during session  ? ? ?Home Living   ?  ?  ?  ?  ?  ?  ?  ?  ?  ?   ?  ?Prior Function    ?  ?  ?   ? ?PT Goals (current goals can now be found in the care plan section) Acute Rehab PT Goals ?Patient Stated Goal: not stated ?PT Goal Formulation: With patient ?Time For Goal Achievement: 06/05/21 ?Potential to Achieve Goals: Fair ?Progress towards PT goals: Progressing toward goals ? ?  ?Frequency ? ? ? Min 4X/week ? ? ? ?  ?PT Plan Current plan remains appropriate  ? ? ?Co-evaluation   ?  ?  ?  ?  ? ?  ?AM-PAC PT "6 Clicks" Mobility   ?Outcome Measure ? Help needed turning from your back to your side while in a flat bed without using bedrails?: A Lot ?Help needed moving from lying on your back to sitting on the side of a flat bed without using bedrails?: Total ?Help needed moving to and from a bed to a chair (including a wheelchair)?: Total ?Help needed standing up from a chair using your arms (e.g., wheelchair or bedside chair)?: Total ?Help needed to walk in hospital room?: Total ?Help needed climbing 3-5 steps with a railing? : Total ?6 Click Score: 7 ? ?  ?End of Session   ?Activity Tolerance: Patient tolerated treatment well ?Patient left: in chair;with call bell/phone within reach;with chair alarm set ?Nurse Communication: Mobility status ?PT Visit Diagnosis: Muscle weakness (generalized) (M62.81);Hemiplegia and hemiparesis;Other symptoms and signs involving the nervous system (R29.898) ?Hemiplegia - Right/Left: Left ?Hemiplegia - dominant/non-dominant: Non-dominant ?Hemiplegia - caused by: Nontraumatic intracerebral hemorrhage ?  ? ? ?Time: 7989-2119 ?PT Time Calculation (min) (ACUTE ONLY): 27 min ? ?Charges:  $Therapeutic Exercise: 8-22 mins          ?          ? ?05/27/2021 ? ?Gwendolyn Morales., PT ?Acute Rehabilitation Services ?[phone]  (pager) ?[phone]  (office) ? ? ?Gwendolyn Morales ?05/27/2021, 11:19 AM ? ?

## 2021-05-27 NOTE — Progress Notes (Signed)
Report given to receiving nurse Morrie Sheldon, RN. ?

## 2021-05-27 NOTE — Progress Notes (Signed)
Exercise and blood pressure education booklet given to and explained to pt and family. Questions answered appropriately. ?Sheela Stack, LPN  ?

## 2021-05-27 NOTE — TOC Transition Note (Signed)
Transition of Care (TOC) - CM/SW Discharge Note ? ? ?Patient Details  ?Name: Gwendolyn Morales ?MRN: [id number] ?Date of Birth: 10-30-1965 ? ?Transition of Care (TOC) CM/SW Contact:  ?Kermit Balo, RN ?Phone Number: ?05/27/2021, 11:20 AM ? ? ?Clinical Narrative:    ?Patient discharging to CIR today. CM signing off.  ? ? ?Final next level of care: IP Rehab Facility ?Barriers to Discharge: No Barriers Identified ? ? ?Patient Goals and CMS Choice ?  ?CMS Medicare.gov Compare Post Acute Care list provided to:: Patient ?Choice offered to / list presented to : Patient ? ?Discharge Placement ?  ?           ?  ?  ?  ?  ? ?Discharge Plan and Services ?  ?Discharge Planning Services: CM Consult ?Post Acute Care Choice: IP Rehab          ?  ?  ?  ?  ?  ?  ?  ?  ?  ?  ? ?Social Determinants of Health (SDOH) Interventions ?  ? ? ?Readmission Risk Interventions ?   ? View : No data to display.  ?  ?  ?  ? ? ? ? ? ?

## 2021-05-27 NOTE — Progress Notes (Signed)
?  Inpatient Rehabilitation Admissions Coordinator  ? ?I have insurance approval to admit her to Cir today. I contacted pt's spouse by phone and he is in agreement. I have alerted acute team and TOC and will make the arrangements to admit today. ? ?Ottie Glazier, RN, MSN ?Rehab Admissions Coordinator ?[phone] ?05/27/2021 8:45 AM ? ?

## 2021-05-27 NOTE — Progress Notes (Signed)
Patient ID: Gwendolyn Morales, female   DOB: 23-Aug-1965, 56 y.o.   MRN: [id number] ? ?Pt arrived to 4M06. Pt oriented to rehab and policies reviewed. Pt in agreement. Assessment complete vitals obtained. Pt call light in reach.  ?Sheela Stack, LPN  ?

## 2021-05-27 NOTE — Progress Notes (Signed)
Kirsteins, Gwendolyn Salk, MD  ?Physician ?Physical Medicine and Rehabilitation ?PMR Pre-admission     ?Signed ?Date of Service:  05/23/2021  9:00 AM ? Related encounter: ED to Hosp-Admission (Current) from 05/21/2021 in [place] ?  ?Signed    ?  ?Show:Clear all ?[x] Written[x] Templated[x] Copied ? ?Added by: ?[x] [name], RN[x] Kirsteins, Gwendolyn Salk, MD ? ?[] Hover for details ?   ?   ?   ?   ?   ?   ?   ?   ?   ?   ?   ?   ?   ?   ?   ?   ?   ?   ?   ?   ?   ?   ?   ?   ?   ?   ?   ?   ?   ?   ?   ?   ?   ?   ?   ?   ?   ?   ?   ?   ?   ?   ?   ?   ?   ?   ?   ?   ?   ?   ?   ?   ?   ?   ?   ?   ?   ?   ?   ?   ?   ?   ?   ?   ?   ?   ?   ?   ?   ?   ?   ?   ?   ?   ?   ?   ?   ?   ?   ?   ?   ?   ?   ?   ?   ?   ?   ?   ?   ?   ?   ?   ?   ?   ?   ?   ?   ?   ?   ?   ?   ?   ?   ?   ?   ?   ?   ?   ?   ?   ?   ?   ?   ?   ?   ?   ?   ?   ?   ?   ?   ?   ?   ?   ?   ?   ?   ?   ?   ?   ?   ?   ?   ?   ?   ?   ?   ?   ?   ?   ?   ?   ?   ?   ?   ?   ?   ?   ?   ?   ?   ?   ?   ?   ?   ?   ?   ?   ?   ?   ?PMR Admission Coordinator Pre-Admission Assessment ?  ?[name] is an 56 y.o., female ?MRN: [id number] ?DOB: 01/12/1966 ?Height: 5\' 10"  (177.8 cm) ?Weight: 111.4 kg ?  ?Insurance Information ?HMO:     PPO:      PCP:      IPA:      80/20:  OTHER:  ?PRIMARY: [place] Medicaid [employer]      Policy#: [id number]      Subscriber: pt ?CM Name: Gwendolyn Morales customer service rep  at [facility] 4/17    Phone#: [phone]      Fax#: [phone] ?Pre-Cert#: [id number]      Employer:  ?Benefits:  Phone #: [phone]   approved on 4/15 for 7 days  Name: 4/12 ?Eff. Date: 08/11/2019     Deduct: none      Out of Pocket Max: none      Life Max: none ?CIR: 100% per medicaid guidelines      SNF: per medicaid ?Outpatient: per medicaid     Co-Pay:  ?Home Health: per medicaid      Co-Pay:  ?DME: per medicaid     Co-Pay:  ?Providers: in network ? ?SECONDARY: none ?  ?Financial Counselor:       Phone#:  ?  ?The  ?Data Collection Information Summary? for patients in Inpatient Rehabilitation Facilities with attached ?Privacy Act [place] Records? was provided and verbally reviewed with: N/A ?  ?Emergency Contact Information ?Contact Information   ?  ?  Name Relation Home Work Mobile  ?  Morales,Robert Spouse     [phone]  ?  Morales, Gwendolyn Son     [phone]  ?  ?   ?  ?Current Medical History  ?Patient Admitting Diagnosis: CVA ?  ?History of Present Illness: 56 year old female with history of HTN , CVA and ICH in 2017 with residual right sided weakness who presented on 05/21/2021 with left sided weakness. Does report off her BP meds since August of last year after having difficulty scheduling an appointment with her PCP. She continued to take her Diona Fanti. On presentation her BP was 204/148.  ?  ?CT head done with signs of subacute right PCA stroke and chronic right frontal stroke.  Patient noted to not be a candidate for thrombolytics due to prior history of intracranial hemorrhage.  CT angiogram head and neck done was negative for LVO.  MRI significant for acute small vessel deep white matter infarcts adjacent to the posterior body of the right lateral ventricle, anterior body of the right lateral ventricle, posterior body of the left lateral ventricle and related to old infarct of the right posterior medial temporal lobe and occipital lobe.  Urinalysis also concerning for UTI. Placed on IV Rocephin pending final culture results. ?  ?Neurology consulted. Echo limited due to body habitus. ED 60 to 65%. . Normal interatrial septum with no evidence of shunt by color flow doppler or agitated saline contrast. No atrial stenosis.  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 TCD bubble study positive per Neurology , LE dopplers negative for DVTs.  ?  ?Started Norvasc and statin. Plan for [place] and Plavix for 3 weeks and then [place] alone. Lovenox for DVT prophylaxis.  ?  ?Complete NIHSS TOTAL: 9 ?   ?Patient's medical record from [facility] has been reviewed by the rehabilitation admission coordinator and physician. ?  ?Past Medical History  ?    ?Past Medical History:  ?Diagnosis Date  ? Hypertension    ? Stroke [facility]) 07/2015  ?  ICH with residual hemiparesis  ?  ?Has the patient had major surgery during 100 days prior to admission? No ?  ?Family History   ?family history includes Diabetes in her mother; Hypertension in her sister. ?  ?Current Medications ?  ?Current Facility-Administered Medications:  ?  acetaminophen (TYLENOL) tablet 650  mg, 650 mg, Oral, Q4H PRN, 650 mg at 05/26/21 1325 **OR** acetaminophen (TYLENOL) 160 MG/5ML solution 650 mg, 650 mg, Per Tube, Q4H PRN **OR** acetaminophen (TYLENOL) suppository 650 mg, 650 mg, Rectal, Q4H PRN, [name], [name], MD ?  amLODipine (NORVASC) tablet 10 mg, 10 mg, Oral, Daily, [name], MD, 10 mg at 05/27/21 [id number] ?  aspirin EC tablet 81 mg, 81 mg, Oral, Daily, [name], [name], MD, 81 mg at 05/27/21 0806 ?  atorvastatin (LIPITOR) tablet 80 mg, 80 mg, Oral, Daily, Shafer, Devon, NP, 80 mg at 05/27/21 0806 ?  clopidogrel (PLAVIX) tablet 75 mg, 75 mg, Oral, Daily, Shafer, Devon, NP, 75 mg at 05/27/21 0806 ?  enoxaparin (LOVENOX) injection 40 mg, 40 mg, Subcutaneous, Q24H, [name], [name], MD, 40 mg at 05/26/21 0917 ?  guaiFENesin (MUCINEX) 12 hr tablet 1,200 mg, 1,200 mg, Oral, BID, [name], MD, 1,200 mg at 05/27/21 [id number] ?  hydrALAZINE (APRESOLINE) injection 10 mg, 10 mg, Intravenous, Q4H PRN, Fuller Plan A, MD, 10 mg at 05/26/21 0804 ?  hydrALAZINE (APRESOLINE) tablet 25 mg, 25 mg, Oral, Q8H, [name], MD, 25 mg at 05/27/21 [id number] ?  hydrochlorothiazide (HYDRODIURIL) tablet 12.5 mg, 12.5 mg, Oral, Daily, [name], MD, 12.5 mg at 05/27/21 [id number] ?  hydrOXYzine (ATARAX) tablet 25 mg, 25 mg, Oral, TID PRN, [name], MD, 25 mg at 05/25/21 2100 ?  metoprolol tartrate (LOPRESSOR) tablet 100 mg, 100 mg, Oral,  BID, [name], MD, 100 mg at 05/27/21 [id number] ?  ondansetron (ZOFRAN) injection 4 mg, 4 mg, Intravenous, Q6H PRN, [name], MD, 4 mg at 05/22/21 1155 ?  phenol (CHLORASEPTIC) mouth spray 1 spray, 1 spray, Mouth/Throat, PRN, [name], MD, 1 spray at 05/26/21 1326 ?  potassium chloride SA (KLOR-CON M) CR tablet 20 mEq, 20 mEq, Oral, Daily, [name], MD, 20 mEq at 05/27/21 [id number] ?  senna-docusate (Senokot-S) tablet 1 tablet, 1 tablet, Oral, QHS PRN, [name], [name], MD ?  sodium chloride flush (NS) 0.9 % injection 3 mL, 3 mL, Intravenous, Once, Kommor, Madison, MD ?  ?Patients Current Diet:  ?Diet Order   ?  ?         ?    Diet Heart Room service appropriate? Yes; Fluid consistency: Thin  Diet effective now       ?  ?  ?   ?  ?  ?   ?  ?Precautions / Restrictions ?Precautions ?Precautions: Fall ?Restrictions ?Weight Bearing Restrictions: No  ?  ?Has the patient had 2 or more falls or a fall with injury in the past year? No ?  ?Prior Activity Level ?Limited Community (1-2x/wk): Mod I with RW ?  ?Prior Functional Level ?Self Care: Did the patient need help bathing, dressing, using the toilet or eating? Needed some help ?  ?Indoor Mobility: Did the patient need assistance with walking from room to room (with or without device)? Independent ?  ?Stairs: Did the patient need assistance with internal or external stairs (with or without device)? Needed some help ?  ?Functional Cognition: Did the patient need help planning regular tasks such as shopping or remembering to take medications? Needed some help ?  ?Patient Information ?Are you of Hispanic, Latino/a,or Spanish origin?: A. No, not of Hispanic, Latino/a, or Spanish origin ?What is your race?: A. White ?Do you need or want an interpreter to communicate with a doctor or health care staff?: 0. No ?  ?Patient's  Response To:  ?Health Literacy and Transportation ?Is the patient able to respond to health literacy and transportation needs?:  Yes ?Health Literacy - How often do you need to have someone help you when you read instructions, pamphlets, or other written material from your doctor or pharmacy?: Never ?In the past 12 months, has lack

## 2021-05-27 NOTE — Discharge Summary (Signed)
Physician Discharge Summary  ?Tabitha Speiser [id number] DOB: July 15, 1965 DOA: 05/21/2021 ? ?PCP: [name], MD ? ?Admit date: 05/21/2021 ?Discharge date: 05/27/2021 ? ?Time spent: 55 minutes ? ?Recommendations for Outpatient Follow-up:  ?Patient will be discharged to inpatient rehabilitation.  Follow-up with MD at inpatient rehab. ?Follow-up with neurology postdischarge from inpatient rehab. ?Patient to be set up with event monitor postdischarge. ? ? ?Discharge Diagnoses:  ?Principal Problem: ?  CVA (cerebral vascular accident) ([place]) ?Active Problems: ?  Hypertensive emergency ?  Hypokalemia ?  Productive cough ?  Obesity (BMI 30-39.9) ?  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  Stage 3a chronic kidney disease (CKD) ([place]) ?  History of CVA with residual deficit ?  E-coli UTI ? ? ?Discharge Condition: Stable and improved. ? ?Diet recommendation: Heart healthy ? ?Filed Weights  ? 05/21/21 0800 05/24/21 0735  ?Weight: 111.4 kg 111.4 kg  ? ? ?History of present illness:  ?HPI per Dr. [name] ?Gwendolyn Morales is a 56 y.o. 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  Patient was last noted to be normal last night 2130.  When she woke up this morning around 5 AM she reported being unable to move left side from her face all the way down to her leg.  Patient reported having slurred speech as well.  Normally patient ambulates with the use of a walker at baseline since her prior stroke.  She has had an intermittent cough.  Denies having any fever, chest pain, palpitations, headache, change in vision, nausea, vomiting, diarrhea, abdominal pain, or dysuria symptoms.  Does not smoke or drink alcohol.  Further talks with the patient and her husband he reports that she has been off of her blood pressure medications since possibly August of last  year after having difficulty scheduling an appointment with her primary care provider.  She had continued to take aspirin, but been off of all of her other blood pressure medications. ?  ?Patient was brought to the emergency department as a code stroke by EMS.  Patient was noted to be afebrile with pulse 86-124, respirations 24, blood pressure elevated up to 204/148, and O2 saturation maintained.  CT scan of the brain with signs of a subacute right PCA stroke and chronic right frontal stroke.   Patient was not a candidate for thrombolytics due to prior history of intracranial hemorrhage. Labs significant for hemoglobin 15.7, potassium 3.4, BUN 10, creatinine 1.21, and alkaline phosphatase 124. CTA of the head neck did not note any large vessel occlusion, but noted atherosclerosis of the both carotid bifurcations without stenosis.  MRI was significant for acute small vessel deep white matter infarcts adjacent to the posterior body of the right lateral ventricle, anterior body of the right lateral ventricle, the posterior body of the left lateral ventricle, and a late to old infarct of the right posterior medial temporal lobe and occipital lobe. ?Hospital Course:  ? ?Assessment and Plan: ?* CVA (cerebral vascular accident) ([place]) ?Patient presented with complaints of left-sided weakness upon awakening the morning of admission.   ?-CT with signs of a subacute right PCA stroke with chronic right frontal stroke.  Patient was not a thrombolytic candidate due to prior history of intracranial hemorrhage.  ?- CTA of the head and neck did not note any large vessel occlusion. ?-Patient seen in consultation by neurology who recommended admission for stroke work-up. ?-  MRI brain done with chronic small vessel disease, acute small vessel deep white matter infarctions adjacent to the posterior body of the right lateral ventricle, anterior body of the right lateral ventricle, posterior body of the left lateral ventricle, late  subacute infarct of the right posterior medial temporal lobe and occipital lobe. ?- ?- echocardiogram -limited study due to patient body habitus.  EF noted to be 60 to 65% with indeterminate diastolic parameters.  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  Message sent to [name] in order to help arrange TEE which.  ?-TCD bubble study done 05/23/2021 noted to be positive per neurology and as such lower extremity Dopplers was ordered, which was negative for DVT. 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  Grade 2 atherosclerotic plaque in the thoracic and descending aorta.  No right-to-left shunt or PFO noted. ?-LDL at 118. ?-Hemoglobin A1c 5.4. ?-PT/OT/ST. ?-Permissive hypertension for initial 24 hours and slowly normalize blood pressure in the next 5 to 7 days. ?-Patient initially started on Norvasc, and Lopressor added and doses uptitrated for better blood pressure control. ?-Patient also maintained on statin. ?-Patient was seen and followed by the stroke team who recommended aspirin and Plavix x3 weeks, and then plavix alone. ?-Per neurology concern for sleep apnea considering participation in sleep stroke prevention study which is being managed by neurology. ?-Neurology also recommended 30-day event monitor postdischarge.  Message sent to cardmaster to arrange event monitor post discharge. ?-Patient be discharged to CIR. ?-Outpatient follow-up with neurology. ? ? ?Hypertensive emergency ?-On admission blood pressure was elevated up to 204/148.  Patient had not been taking any of her home blood pressure medications since possibly August 2022. ?- Previous blood pressure regimen included amlodipine 10 mg daily,  hydralazine 50 mg 3 times daily, HCTZ 12.5 mg daily, and metoprolol 100 mg twice daily. ?-Patient noted to have been out of her antihypertensive medication since August 2022. ?-Allowed for permissive hypertension given acute stroke and slowly normalize blood pressure in the next 5 to 7 days secondary to acute CVA. ?-Patient subsequently started on Norvasc and Lopressor added to regimen and doses uptitrated for better blood pressure control. ?-Patient's home regimen HCTZ was subsequently resumed. ?-Hydralazine 25 mg p.o. 3 times daily was also started with improvement with blood pressure. ?-Hydralazine IV as needed for systolic blood pressures greater than [id number] or diastolic blood pressure greater than 110. ?-We will need outpatient follow-up with PCP postdischarge from inpatient rehab. ?-Patient will need prescriptions for antihypertensive medications on discharge from inpatient rehab. ? ?Hypokalemia ?Acute. ? - Repleted.  ?-Home regimen oral potassium supplementation were resumed during the hospitalization.  ?-Outpatient follow-up. ? ?E-coli UTI ?- Urine cultures with > 100,000 colonies of E. Coli which was pansensitive. ?-Patient initially placed on IV Rocephin pending urine cultures and once cultures has resulted patient was subsequently transition to oral cephalexin and completed a 5-day course of antibiotic treatment.   ? ?Stage 3a chronic kidney disease (CKD) ([place]) ?CKD stage IIIa.  ? Creatinine 1.21 with BUN 10.  Baseline creatinine previously had been around 0.9 back in 2019 per records on Care Everywhere.  This may constitute an acute kidney injury. ?-Urinalysis concerning for UTI with large leukocytes, positive nitrite, many bacteria, WBC > 50.  Protein of 100. ?-  Renal function trended down with gentle hydration and stabilized at 1.00 on day of discharge.  ?-IV fluids discontinued.  ?-Outpatient follow-up with PCP. ? 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 ?-PT/OT assessed patient, saw patient during the hospitalization and recommended

## 2021-05-27 NOTE — H&P (Signed)
Physical Medicine and Rehabilitation Admission H&P ?  ?  ?   ?Chief Complaint  ?Patient presents with  ? Stroke with functional deficits  ?  ?  ?HPI: Gwendolyn Morales is a 56 year old RH-female with history of malignant HTN, left parietal ICH 2017 and received inpatient rehab services 07/24/2015 - 08/17/2015, mild left sided weakness.  Per chart review patient lives with spouse and family.  1 level home with ramped entrance.  Family did assist with some ADLs.  Presented 06/01/21 after awaking with left sided weakness of acute onset. BP elevated at admission and CTA head/neck was negative for LVO and showed 50% stenosis of L-VA and 30$ stenosis B-VA V4 segment. MRI brain showed advanced small vessel disease with acute small vessel deep white matter infarcts in anterior and posterior right lateral ventricle and posterior body of left lateral ventricle. TCD showed moderate size right to left shunt. TEE showed normal LVH, was negative for thrombus and showed severe mitral valve calcification with trivial AV sclerosis and mild grade 2 atherosclerotic plaque in thoracic and ascending aorta. BLE dopplers were negative for DVT.  Maintained on Lovenox for DVT prophylaxis. ?  ?Dr. 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 Patient continues to be limited by left sided weakness with flexor tone and sensory deficits, generalized right sided weakness, dysarthria and ,working on pre-gait activity. CIR recommended due to functional decline.    ?  ?  ?Review of Systems  ?Constitutional:  Negative for chills and fever.  ?Respiratory:  Negative for cough and shortness of breath.   ?Cardiovascular:  Negative for chest pain and palpitations.  ?Gastrointestinal:  Positive for constipation and heartburn.  ?Musculoskeletal:  Positive for joint pain (left shoulder) and myalgias.  ?Neurological:  Positive for sensory change, speech change and focal  weakness.  ?Psychiatric/Behavioral:  The patient does not have insomnia.   ?  ?  ?    ?Past Medical History:  ?Diagnosis Date  ? Hypertension    ? Stroke [facility]) 07/2015  ?  ICH with residual hemiparesis  ?  ?  ?     ?Past Surgical History:  ?Procedure Laterality Date  ? CESAREAN SECTION      ?  x 2  ?  ?  ?     ?Family History  ?Problem Relation Age of Onset  ? Diabetes Mother    ? Hypertension Sister    ?  ?  ?Social History: Married. Husband works days. Lives in living room and goes from lift chair to recline chair. Was able to use walker to walk few steps --uses potty chair. She  reports that she has never smoked. She has never used smokeless tobacco. She reports that she does not drink alcohol and does not use drugs. ?  ?  ?Allergies: No Known Allergies ?  ?  ?      ?Medications Prior to Admission  ?Medication Sig Dispense Refill  ? aspirin EC 81 MG tablet Take 81 mg by mouth daily.      ? diphenhydrAMINE (BENADRYL) 25 mg capsule Take 25 mg by mouth at bedtime.      ? ferrous sulfate 325 (65 FE) MG tablet Take 1 tablet (325 mg total) by mouth daily with breakfast. 30 tablet 0  ? guaiFENesin (MUCINEX) 600 MG 12 hr tablet Take 600 mg by mouth daily.      ? Potassium 99 MG TABS Take 99  mg by mouth daily.      ? amLODipine (NORVASC) 10 MG tablet Take 1 tablet (10 mg total) by mouth daily. (Patient not taking: Reported on 05/21/2021) 30 tablet 0  ? busPIRone (BUSPAR) 5 MG tablet Take 1.5 tablets (7.5 mg total) by mouth 2 (two) times daily. (Patient not taking: Reported on 05/21/2021) 60 tablet 0  ? hydrALAZINE (APRESOLINE) 50 MG tablet Take 1 tablet (50 mg total) by mouth every 8 (eight) hours. (Patient not taking: Reported on 05/21/2021) 90 tablet 0  ? hydrochlorothiazide (MICROZIDE) 12.5 MG capsule Take 1 capsule (12.5 mg total) by mouth daily. (Patient not taking: Reported on 05/21/2021) 30 capsule 0  ? metoprolol (LOPRESSOR) 100 MG tablet TAKE ONE TABLET BY MOUTH TWICE DAILY (Patient not taking: Reported on 05/21/2021)  60 tablet 0  ? potassium chloride SA (K-DUR,KLOR-CON) 20 MEQ tablet Take 1 tablet (20 mEq total) by mouth daily. (Patient not taking: Reported on 05/21/2021) 30 tablet 0  ?  ?  ?  ?  ?Home: ?Home Living ?Family/patient expects to be discharged to:: Private residence ?Living Arrangements: Spouse/significant other, Children (adult son) ?Available Help at Discharge: Family, Available 24 hours/day (son and spouse can provide 24/7) ?Type of Home: House ?Home Access: Level entry, Ramped entrance ?Home Layout: One level ?Bathroom Shower/Tub: Gaffer, Door ?Bathroom Toilet: Handicapped height ?Bathroom Accessibility: Yes ?Home Equipment: Conservation officer, nature (2 wheels), BSC/3in1, Shower seat, Grab bars - toilet, Grab bars - tub/shower, Hand held shower head, Wheelchair - manual ? Lives With: Spouse, Son ?  ?Functional History: ?Prior Function ?Prior Level of Function : Needs assist ?Physical Assist : ADLs (physical) ?Mobility Comments: uses RW ?ADLs Comments: family helps with IADL as needed ?  ?Functional Status:  ?Mobility: ?Bed Mobility ?Overal bed mobility: Needs Assistance ?Bed Mobility: Rolling, Sidelying to Sit ?Rolling: Mod assist ?Sidelying to sit: Mod assist, +2 for physical assistance ?General bed mobility comments: required assistance to scoot to EOB ?Transfers ?Overall transfer level: Needs assistance ?Equipment used: Ambulation equipment used ?Transfers: Bed to chair/wheelchair/BSC, Sit to/from Stand ?Sit to Stand: Max assist, +2 physical assistance ?Bed to/from chair/wheelchair/BSC transfer type:: Via Lift equipment ?Squat pivot transfers: +2 physical assistance, Max assist ?Transfer via Lift Equipment: Stedy ?General transfer comment: mod to max assist +2 to stand from EOB and into stedy with patient demonstrating pushing with RUE ?Ambulation/Gait ?General Gait Details: NT ?  ?ADL: ?ADL ?Overall ADL's : Needs assistance/impaired ?Eating/Feeding: Minimal assistance ?Grooming: Moderate assistance ?Upper Body  Bathing: Moderate assistance ?Lower Body Bathing: Maximal assistance, Sit to/from stand ?Lower Body Bathing Details (indicate cue type and reason): peri area cleaning performe while standing with max assist +2 for standing ?Upper Body Dressing : Maximal assistance ?Lower Body Dressing: Total assistance ?Toileting- Clothing Manipulation and Hygiene: Total assistance ?Functional mobility during ADLs: +2 for physical assistance, Maximal assistance ?General ADL Comments: focused on sitting and standing balance ?  ?Cognition: ?Cognition ?Overall Cognitive Status: Impaired/Different from baseline ?Orientation Level: Oriented X4 ?Year: 2023 ?Month: April ?Day of Week: Incorrect ?Memory: Impaired (recalled 4/5 items without cue, 1/5 with cue) ?Memory Impairment: Retrieval deficit ?Awareness: Appears intact ?Problem Solving: Impaired ?Problem Solving Impairment: Functional complex (pt needs cues to slow rate of speech and overarticulate) ?Safety/Judgment: Appears intact ?Comments: Pt states she does not want to repeat herself but needs max cues to use compensation strategies to improve intelligiblity. ?Cognition ?Arousal/Alertness: Awake/alert ?Behavior During Therapy: [facility] for tasks assessed/performed ?Overall Cognitive Status: Impaired/Different from baseline ?Area of Impairment: Attention, Safety/judgement, Awareness, Problem solving ?Current Attention Level:  Sustained ?Safety/Judgement: Decreased awareness of safety ?Awareness: Emergent ?Problem Solving: Slow processing ?General Comments: pleasant and eager to participate with therapy ?  ?Blood pressure (!) 160/97, pulse 96, temperature 98.3 ?F (36.8 ?C), resp. rate 18, height 5\' 10"  (1.778 m), weight 111.4 kg, SpO2 97 %. ?Physical Exam ?Vitals and nursing note reviewed.  ?Constitutional:   ?   Appearance: Normal appearance.  ?Neurological:  ?   Mental Status: She is alert.  ?   Sensory: Sensory deficit present.  ?   Motor: Weakness (mild left sided weakness after therapy  in 2017--worse now) present.  ?   Comments: Left facial paresis with severe dysarthria with low voice volume--tends to keep mouth open. 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     ?  ? ?General: No

## 2021-05-27 NOTE — Progress Notes (Signed)
Occupational Therapy Treatment ?Patient Details ?Name: Gwendolyn Morales ?MRN: [id number] ?DOB: January 10, 1966 ?Today's Date: 05/27/2021 ? ? ?History of present illness 56 y.o. female presents with complaints of left-sided weakness. MRI + Advanced chronic small-vessel ischemic changes; acute small-vessel deep white matter infarctions adjacent to the posterior body of the right lateral ventricle, the anterior body of the right lateral ventricle in the posterior body of the left  lateral ventricle; late subacutte to old infarct of the R posteromedial temporal and occipital lobes. 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  ?OT comments ? Patient seen to address bed mobility, EOB sitting balance, standing in [place], and transfer to chair withStedy. Patient performed 4 stands from EOB into the [employer] with max assist +2.  Patient able to maintain grip with LUE while in stedy. Patient expected to discharge to AIR.   ? ?Recommendations for follow up therapy are one component of a multi-disciplinary discharge planning process, led by the attending physician.  Recommendations may be updated based on patient status, additional functional criteria and insurance authorization. ?   ?Follow Up Recommendations ? Acute inpatient rehab (3hours/day)  ?  ?Assistance Recommended at Discharge Frequent or constant Supervision/Assistance  ?Patient can return home with the following ? Two people to help with walking and/or transfers;Two people to help with bathing/dressing/bathroom;Assistance with cooking/housework;Assistance with feeding;Direct supervision/assist for medications management;Direct supervision/assist for financial management;Assist for transportation;Help with stairs or ramp for entrance ?  ?Equipment Recommendations ? Hospital bed  ?  ?Recommendations for Other Services   ? ?  ?Precautions / Restrictions Precautions ?Precautions: Fall  ? ? ?  ? ?Mobility Bed Mobility ?  ?   ?  ?  ?  ?  ?  ?  ?  ? ?Transfers ?  ?  ?  ?  ?  ?  ?  ?  ?  ?  ?  ?  ?Balance   ?  ?  ?  ?  ?  ?  ?  ?  ?  ?  ?  ?  ?  ?  ?  ?  ?  ?  ?   ? ?ADL either performed or assessed with clinical judgement  ? ?ADL Overall ADL's : Needs assistance/impaired ?  ?  ?  ?  ?  ?  ?  ?  ?Upper Body Dressing : Maximal assistance;Bed level ?Upper Body Dressing Details (indicate cue type and reason): changed gowns at bed level ?  ?  ?  ?  ?  ?  ?  ?  ?  ?General ADL Comments: focused on sitting and standing balance ?  ? ?Extremity/Trunk Assessment Upper Extremity Assessment ?RUE Deficits / Details: generalized weakness but functional ?LUE Deficits / Details: PROM overall WFL, increased spasticity; moves in flexor pattern however ablet o move out of synergy; not functional at this time ?LUE Sensation: decreased light touch;decreased proprioception ?LUE Coordination: decreased fine motor;decreased gross motor ?  ?  ?  ?  ?  ? ?Vision   ?Eye Alignment: Within Functional Limits ?Ocular Range of Motion: Within Functional Limits ?  ?Perception   ?  ?Praxis   ?  ? ?Cognition   ?  ?  ?  ?  ?  ?  ?  ?  ?  ?  ?  ?  ?  ?  ?  ?  ?  ?  ?General Comments: indicated she had a fear of heights when trying to stand ?  ?  ?   ?  Exercises   ? ?  ?Shoulder Instructions   ? ? ?  ?General Comments    ? ? ?Pertinent Vitals/ Pain         ? ?Home Living   ?  ?  ?  ?  ?  ?  ?  ?  ?  ?  ?  ?  ?  ?  ?  ?  ?  ?  ? ?  ?Prior Functioning/Environment    ?  ?  ?  ?   ? ?Frequency ? Min 2X/week  ? ? ? ? ?  ?Progress Toward Goals ? ?OT Goals(current goals can now be found in the care plan section) ? Progress towards OT goals: Progressing toward goals ? ?Acute Rehab OT Goals ?Patient Stated Goal: go to rehab ?OT Goal Formulation: With patient ?Time For Goal Achievement: 06/05/21 ?Potential to Achieve Goals: Good ?ADL Goals ?Pt Will Perform Grooming: with supervision;with set-up;sitting ?Pt Will Perform Upper Body Bathing: with supervision;with set-up;sitting ?Pt Will  Perform Lower Body Bathing: with mod assist;bed level ?Pt Will Transfer to Toilet: with mod assist;squat pivot transfer ?Additional ADL Goal #1: Pt will maintain midlien postural control in sitting during dynamic activity to increased independence with ADL and mobility  ?Plan Discharge plan remains appropriate   ? ?Co-evaluation ? ? ? PT/OT/SLP Co-Evaluation/Treatment: Yes ?Reason for Co-Treatment: For patient/therapist safety;To address functional/ADL transfers ?  ?OT goals addressed during session: ADL's and self-care ?  ? ?  ?AM-PAC OT "6 Clicks" Daily Activity     ?Outcome Measure ? ? Help from another person eating meals?: A Little ?Help from another person taking care of personal grooming?: A Lot ?Help from another person toileting, which includes using toliet, bedpan, or urinal?: Total ?Help from another person bathing (including washing, rinsing, drying)?: A Lot ?Help from another person to put on and taking off regular upper body clothing?: A Lot ?Help from another person to put on and taking off regular lower body clothing?: Total ?6 Click Score: 11 ? ?  ?End of Session Equipment Utilized During Treatment: Other (comment) Antony Salmon) ? ?OT Visit Diagnosis: Unsteadiness on feet (R26.81);Other abnormalities of gait and mobility (R26.89);Muscle weakness (generalized) (M62.81);History of falling (Z91.81);Other symptoms and signs involving cognitive function;Other symptoms and signs involving the nervous system (R29.898);Hemiplegia and hemiparesis ?Hemiplegia - Right/Left: Left ?Hemiplegia - dominant/non-dominant: Non-Dominant ?Hemiplegia - caused by: Cerebral infarction ?  ?Activity Tolerance Patient tolerated treatment well ?  ?Patient Left in chair;with call bell/phone within reach;with chair alarm set;Other (comment) (lift pad placed in chair) ?  ?Nurse Communication Mobility status;Need for lift equipment ?  ? ?   ? ?Time: 7616-0737 ?OT Time Calculation (min): 27 min ? ?Charges: OT General Charges ?$OT Visit:  1 Visit ?OT Treatments ?$Therapeutic Activity: 8-22 mins ? ?Alfonse Flavors, OTA ?Acute Rehabilitation Services  ?Pager [phone] ?Office [phone] ? ? ?Gwendolyn Morales ?05/27/2021, 11:48 AM ?

## 2021-05-28 LAB — COMPREHENSIVE METABOLIC PANEL
ALT: 55 U/L — ABNORMAL HIGH (ref 0–44)
AST: 47 U/L — ABNORMAL HIGH (ref 15–41)
Albumin: 3.6 g/dL (ref 3.5–5.0)
Alkaline Phosphatase: 120 U/L (ref 38–126)
Anion gap: 11 (ref 5–15)
BUN: 15 mg/dL (ref 6–20)
CO2: 24 mmol/L (ref 22–32)
Calcium: 9.8 mg/dL (ref 8.9–10.3)
Chloride: 105 mmol/L (ref 98–111)
Creatinine, Ser: 1.08 mg/dL — ABNORMAL HIGH (ref 0.44–1.00)
GFR, Estimated: >60 mL/min (ref >60)
Glucose, Bld: 94 mg/dL (ref 70–99)
Potassium: 3.2 mmol/L — ABNORMAL LOW (ref 3.5–5.1)
Sodium: 140 mmol/L (ref 135–145)
Total Bilirubin: 0.9 mg/dL (ref 0.3–1.2)
Total Protein: 7 g/dL (ref 6.5–8.1)

## 2021-05-28 LAB — CBC WITH DIFFERENTIAL/PLATELET
Abs Immature Granulocytes: 0.02 10*3/uL (ref 0.00–0.07)
Basophils Absolute: 0 10*3/uL (ref 0.0–0.1)
Basophils Relative: 0 %
Eosinophils Absolute: 0.6 10*3/uL — ABNORMAL HIGH (ref 0.0–0.5)
Eosinophils Relative: 7 %
HCT: 46.5 % — ABNORMAL HIGH (ref 36.0–46.0)
Hemoglobin: 14.8 g/dL (ref 12.0–15.0)
Immature Granulocytes: 0 %
Lymphocytes Relative: 17 %
Lymphs Abs: 1.4 10*3/uL (ref 0.7–4.0)
MCH: 26 pg (ref 26.0–34.0)
MCHC: 31.8 g/dL (ref 30.0–36.0)
MCV: 81.6 fL (ref 80.0–100.0)
Monocytes Absolute: 0.6 10*3/uL (ref 0.1–1.0)
Monocytes Relative: 7 %
Neutro Abs: 5.4 10*3/uL (ref 1.7–7.7)
Neutrophils Relative %: 69 %
Platelets: 330 10*3/uL (ref 150–400)
RBC: 5.7 MIL/uL — ABNORMAL HIGH (ref 3.87–5.11)
RDW: 16 % — ABNORMAL HIGH (ref 11.5–15.5)
WBC: 8 10*3/uL (ref 4.0–10.5)
nRBC: 0 % (ref 0.0–0.2)

## 2021-05-28 MED ORDER — DICLOFENAC SODIUM 1 % EX GEL
2.0000 g | Freq: Four times a day (QID) | CUTANEOUS | Status: DC | PRN
  Administered 2021-05-29: 2 g via TOPICAL
  Filled 2021-05-28 (×2): qty 100

## 2021-05-28 NOTE — Evaluation (Signed)
Occupational Therapy Assessment and Plan ? ?Patient Details  ?Name: Gwendolyn Morales ?MRN: [id number] ?Date of Birth: 1965-03-15 ? ?OT Diagnosis: abnormal posture, apraxia, cognitive deficits, hemiplegia affecting non-dominant side, and muscle weakness (generalized) ?Rehab Potential: Rehab Potential (ACUTE ONLY): Good ?ELOS: 3 weeks  ? ?Today's Date: 05/28/2021 ?OT Individual Time: [id number] ?OT Individual Time Calculation (min): 75 min    ? ?Hospital Problem: Principal Problem: ?  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  Stroke (cerebrum) ([place]) ? ? ?Past Medical History:  ?Past Medical History:  ?Diagnosis Date  ? Hypertension   ? Stroke [facility]) 07/2015  ? ICH with residual hemiparesis  ? ?Past Surgical History:  ?Past Surgical History:  ?Procedure Laterality Date  ? BUBBLE STUDY  05/24/2021  ? Procedure: BUBBLE STUDY;  Surgeon: Sueanne Margarita, MD;  Location: [place];  Service: Cardiovascular;;  ? CESAREAN SECTION    ? x 2  ? TEE WITHOUT CARDIOVERSION N/A 05/24/2021  ? Procedure: TRANSESOPHAGEAL ECHOCARDIOGRAM (TEE);  Surgeon: Sueanne Margarita, MD;  Location: [place];  Service: Cardiovascular;  Laterality: N/A;  ? ? ?Assessment & Plan ?Clinical Impression: Gwendolyn Morales is a 56 year old RH-female with history of malignant HTN, left parietal ICH 2017 and received inpatient rehab services 07/24/2015 - 08/17/2015, mild left sided weakness.  Per chart review patient lives with spouse and family.  1 level home with ramped entrance.  Family did assist with some ADLs.  Presented 06/01/21 after awaking with left sided weakness of acute onset. BP elevated at admission and CTA head/neck was negative for LVO and showed 50% stenosis of L-VA and 30$ stenosis B-VA V4 segment. MRI brain showed advanced small vessel disease with acute small vessel deep white matter infarcts in anterior and posterior right lateral ventricle and posterior body of left lateral ventricle. TCD showed moderate size right to left shunt. TEE  showed normal LVH, was negative for thrombus and showed severe mitral valve calcification with trivial AV sclerosis and mild grade 2 atherosclerotic plaque in thoracic and ascending aorta. BLE dopplers were negative for DVT.  Maintained on Lovenox for DVT prophylaxis. ?  ?Dr. 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 Patient continues to be limited by left sided weakness with flexor tone and sensory deficits, generalized right sided weakness, dysarthria and ,working on pre-gait activity. .  Patient transferred to CIR on 05/27/2021 .   ? ?Patient currently requires max with basic self-care skills secondary to muscle weakness, decreased cardiorespiratoy endurance, motor apraxia, decreased coordination, and decreased motor planning, decreased visual perceptual skills, left side neglect, decreased initiation, decreased problem solving, decreased memory, and delayed processing, and decreased sitting balance, decreased standing balance, decreased postural control, hemiplegia, and decreased balance strategies.  Prior to hospitalization, patient could complete ADLs with independent . ? ?Patient will benefit from skilled intervention to increase independence with basic self-care skills prior to discharge home with care partner.  Anticipate patient will require 24 hour supervision and minimal physical assistance and follow up home health. ? ?OT - End of Session ?Activity Tolerance: Tolerates 30+ min activity with multiple rests ?Endurance Deficit: Yes ?Endurance Deficit Description: limited standing due to fear and anxiety; fatigue reported during seated self care ?OT Assessment ?Rehab Potential (ACUTE ONLY): Good ?OT Barriers to Discharge: Incontinence ?OT Patient demonstrates impairments in the following area(s): Balance;Perception;Safety;Cognition;Edema;Endurance;Motor;Pain;Vision;Skin Integrity ?OT Basic ADL's Functional Problem(s):  Grooming;Bathing;Dressing;Toileting ?OT Transfers Functional Problem(s): Toilet;Tub/Shower ?OT Additional Impairment(s): Fuctional Use of Upper  Extremity ?OT Plan ?OT Intensity: Minimum of 1-2 x/day, 45 to 90 minutes ?OT Frequency: 5 out of 7 days ?OT Duration/Estimated Length of Stay: 3 weeks ?OT Treatment/Interventions: Balance/vestibular training;Discharge planning;Functional electrical stimulation;Pain management;Self Care/advanced ADL retraining;Therapeutic Activities;UE/LE Coordination activities;Visual/perceptual remediation/compensation;Skin care/wound managment;Patient/family education;Functional mobility training;Disease mangement/prevention;Cognitive remediation/compensation;Therapeutic Exercise;Community reintegration;DME/adaptive equipment instruction;Neuromuscular re-education;Psychosocial support;Splinting/orthotics;Wheelchair propulsion/positioning;UE/LE Strength taining/ROM ?OT Basic Self-Care Anticipated Outcome(s): CGA-min assist ?OT Toileting Anticipated Outcome(s): min assist ?OT Bathroom Transfers Anticipated Outcome(s): CGA ?OT Recommendation ?Patient destination: Home ?Follow Up Recommendations: Home health OT ?Equipment Recommended: To be determined ? ? ?OT Evaluation ?Precautions/Restrictions  ?Precautions ?Precautions: Fall ?Precaution Comments: left hemi ?Restrictions ?Weight Bearing Restrictions: No ?General ?Chart Reviewed: Yes ?Pain ?Pain Assessment ?Pain Scale: 0-10 ?Pain Score: 0-No pain ?Home Living/Prior Functioning ?Home Living ?Family/patient expects to be discharged to:: Private residence ?Living Arrangements: Spouse/significant other, Children ?Available Help at Discharge: Family, Available 24 hours/day ?Type of Home: House ?Home Access: Level entry, Ramped entrance ?Home Layout: One level ?Bathroom Shower/Tub: Gaffer, Door ?Bathroom Accessibility: Yes ? Lives With: Spouse, Son ?IADL History ?Education: HS, used to work at [employer] ?Prior Function ?Level of  Independence: Independent with basic ADLs, Independent with gait, Independent with transfers, Independent with homemaking with ambulation ?Vocation: On disability ?Leisure: Hobbies-yes (Comment) (loves dogs, has 4, cooking, traveling) ?Vision ?Baseline Vision/History: 0 No visual deficits ?Ability to See in Adequate Light: 0 Adequate ?Patient Visual Report: No change from baseline ?Vision Assessment?: Yes ?Eye Alignment: Within Functional Limits ?Ocular Range of Motion: Within Functional Limits ?Alignment/Gaze Preference: Within Defined Limits ?Tracking/Visual Pursuits: Decreased smoothness of horizontal tracking;Decreased smoothness of vertical tracking;Requires cues, head turns, or add eye shifts to track ?Saccades: Additional head turns occurred during testing;Additional eye shifts occurred during testing ?Convergence: Within functional limits ?Perception  ?Perception: Impaired ?Inattention/Neglect: Does not attend to left side of body (mild) ?Praxis ?Praxis: Intact ?Cognition ?Cognition ?Overall Cognitive Status: Impaired/Different from baseline ?Arousal/Alertness: Awake/alert ?Orientation Level: Person;Place;Situation ?Person: Oriented ?Place: Oriented ?Situation: Oriented ?Memory: Impaired ?Memory Impairment: Retrieval deficit ?Attention: Selective ?Selective Attention: Impaired ?Selective Attention Impairment: Functional basic;Verbal basic ?Awareness: Appears intact ?Problem Solving: Impaired ?Problem Solving Impairment: Functional complex ?Executive Function: Decision Making;Self Correcting ?Decision Making: Impaired ?Decision Making Impairment: Functional basic ?Self Correcting: Impaired ?Self Correcting Impairment: Functional basic ?Safety/Judgment: Appears intact ?Brief Interview for Mental Status (BIMS) ?Repetition of Three Words (First Attempt): 3 ?Temporal Orientation: Year: Correct ?Temporal Orientation: Month: Accurate within 5 days ?Temporal Orientation: Day: Correct ?Recall: "Sock": Yes, no cue  required ?Recall: "Blue": Yes, no cue required ?Recall: "Bed": Yes, no cue required ?BIMS Summary Score: 15 ?Sensation ?Sensation ?Light Touch: Appears Intact ?Hot/Cold: Appears Intact ?Proprioception: Impaired Detai

## 2021-05-28 NOTE — Discharge Instructions (Addendum)
Inpatient Rehab Discharge Instructions ? ?Gwendolyn Morales ?Discharge date and time:   ? ?Activities/Precautions/ Functional Status: ?Activity: activity as tolerated ?Diet: cardiac diet ?Wound Care: Routine skin checks ? ?Functional status:  ?___ No restrictions     ___ Walk up steps independently ?_X__ 24/7 supervision/assistance   ___ Walk up steps with assistance ?___ Intermittent supervision/assistance  ___ Bathe/dress independently ?___ Walk with walker     _X__ Bathe/dress with assistance ?___ Walk Independently    ___ Shower independently ?___ Walk with assistance    ___ Shower with assistance ?_X__ No alcohol     ___ Return to work/school ________ ? ?Special Instructions: ?No driving smoking or alcohol ? ? ?COMMUNITY REFERRALS UPON DISCHARGE:   ? ?Home Health:   PT OT RN AIDE  ?               Agency:[facility] [phone]  ? ?Medical Equipment/Items Ordered:HOSPITAL BED AND HOYER LIFT ?                                                Agency/Supplier:[facility]  [phone] ? ?HUSBAND TO CONTACT SOCIAL SECURITY TO SEE IF ELIGIBLE FOR MEDICARE. ?INFORMATION GIVEN FOR PCS AND CAP PROGRAM ?[facility] [phone] ? ?My questions have been answered and I understand these instructions. I will adhere to these goals and the provided educational materials after my discharge from the hospital. ? ?Patient/Caregiver Signature _______________________________ Date __________ ? ?Clinician Signature _______________________________________ Date __________ ? ?Please bring this form and your medication list with you to all your follow-up doctor's appointments.  STROKE/TIA DISCHARGE INSTRUCTIONS ?SMOKING Cigarette smoking nearly doubles your risk of having a stroke & is the single most alterable risk factor  ?If you smoke or have smoked in the last 12 months, you are advised to quit smoking for your health. Most of the excess cardiovascular risk related to smoking disappears within a year of  stopping. ?Ask you doctor about anti-smoking medications ?[name] Quit Line: 1-800-QUIT NOW ?Free Smoking Cessation Classes [phone]  ?CHOLESTEROL Know your levels; limit fat & cholesterol in your diet  ?Lipid Panel  ?   ?Component Value Date/Time  ? CHOL 184 05/22/2021 0413  ? TRIG 129 05/22/2021 0413  ? HDL 40 (L) 05/22/2021 0413  ? CHOLHDL 4.6 05/22/2021 0413  ? VLDL 26 05/22/2021 0413  ? LDLCALC 118 (H) 05/22/2021 0413  ? ? ? Many patients benefit from treatment even if their cholesterol is at goal. ?Goal: Total Cholesterol (CHOL) less than 160 ?Goal:  Triglycerides (TRIG) less than 150 ?Goal:  HDL greater than 40 ?Goal:  LDL (LDLCALC) less than 100 ?  ?BLOOD PRESSURE American Stroke Association blood pressure target is less that 120/80 mm/Hg  ?Your discharge blood pressure is:  BP: (!) 146/106 Monitor your blood pressure ?Limit your salt and alcohol intake ?Many individuals will require more than one medication for high blood pressure  ?DIABETES (A1c is a blood sugar average for last 3 months) Goal HGBA1c is under 7% (HBGA1c is blood sugar average for last 3 months)  ?Diabetes: ?No known diagnosis of diabetes   ? ?Lab Results  ?Component Value Date  ? HGBA1C 5.4 05/22/2021  ? ? Your HGBA1c can be lowered with medications, healthy diet, and exercise. ?Check your blood sugar as directed by your physician ?Call your physician if you experience unexplained or low blood sugars.  ?  PHYSICAL ACTIVITY/REHABILITATION Goal is 30 minutes at least 4 days per week  ?Activity: Increase activity slowly, ?Therapies: Physical Therapy: Home Health ?Return to work:  Activity decreases your risk of heart attack and stroke and makes your heart stronger.  It helps control your weight and blood pressure; helps you relax and can improve your mood. ?Participate in a regular exercise program. ?Talk with your doctor about the best form of exercise for you (dancing, walking, swimming, cycling).  ?DIET/WEIGHT Goal is to maintain a healthy  weight  ?Your discharge diet is:  ?Diet Order   ? ?       ?  Diet Heart Room service appropriate? Yes; Fluid consistency: Thin  Diet effective now       ?  ? ?  ?  ? ?  ?  liquids ?Your height is:  Height: 5\' 10"  (177.8 cm) ?Your current weight is: Weight: 106.9 kg ?Your Body Mass Index (BMI) is:  BMI (Calculated): 33.82 Following the type of diet specifically designed for you will help prevent another stroke. ?Your goal weight range is:   ?Your goal Body Mass Index (BMI) is 19-24. ?Healthy food habits can help reduce 3 risk factors for stroke:  High cholesterol, hypertension, and excess weight.  ?RESOURCES Stroke/Support Group:  Call [phone] ?  ?STROKE EDUCATION PROVIDED/REVIEWED AND GIVEN TO PATIENT Stroke warning signs and symptoms ?How to activate emergency medical system (call 911). ?Medications prescribed at discharge. ?Need for follow-up after discharge. ?Personal risk factors for stroke. ?Pneumonia vaccine given: No ?Flu vaccine given: No ?My questions have been answered, the writing is legible, and I understand these instructions.  I will adhere to these goals & educational materials that have been provided to me after my discharge from the hospital.  ? ?  ?

## 2021-05-28 NOTE — Evaluation (Signed)
Physical Therapy Assessment and Plan ? ?Patient Details  ?Name: Gwendolyn Morales ?MRN: [id number] ?Date of Birth: 09-24-1965 ? ?PT Diagnosis: Abnormal posture, Abnormality of gait, Cognitive deficits, Difficulty walking, Hemiparesis non-dominant, Impaired cognition, and Muscle weakness ?Rehab Potential: Fair ?ELOS: 3 weeks  ? ?Today's Date: 05/28/2021 ?PT Individual Time: [id number] ?PT Individual Time Calculation (min): 75 min   ? ?Hospital Problem: Principal Problem: ?  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  Stroke (cerebrum) ([place]) ? ? ?Past Medical History:  ?Past Medical History:  ?Diagnosis Date  ? Hypertension   ? Stroke [facility]) 07/2015  ? ICH with residual hemiparesis  ? ?Past Surgical History:  ?Past Surgical History:  ?Procedure Laterality Date  ? BUBBLE STUDY  05/24/2021  ? Procedure: BUBBLE STUDY;  Surgeon: Sueanne Margarita, MD;  Location: [place];  Service: Cardiovascular;;  ? CESAREAN SECTION    ? x 2  ? TEE WITHOUT CARDIOVERSION N/A 05/24/2021  ? Procedure: TRANSESOPHAGEAL ECHOCARDIOGRAM (TEE);  Surgeon: Sueanne Margarita, MD;  Location: [place];  Service: Cardiovascular;  Laterality: N/A;  ? ? ?Assessment & Plan ?Clinical Impression: Patient is a 56 year old RH-female with history of malignant HTN, left parietal ICH 2017 and received inpatient rehab services 07/24/2015 - 08/17/2015, mild left sided weakness.  Per chart review patient lives with spouse and family.  1 level home with ramped entrance.  Family did assist with some ADLs.  Presented 06/01/21 after awaking with left sided weakness of acute onset. BP elevated at admission and CTA head/neck was negative for LVO and showed 50% stenosis of L-VA and 30$ stenosis B-VA V4 segment. MRI brain showed advanced small vessel disease with acute small vessel deep white matter infarcts in anterior and posterior right lateral ventricle and posterior body of left lateral ventricle. TCD showed moderate size right to left shunt. TEE showed normal LVH, was  negative for thrombus and showed severe mitral valve calcification with trivial AV sclerosis and mild grade 2 atherosclerotic plaque in thoracic and ascending aorta. BLE dopplers were negative for DVT.  Maintained on Lovenox for DVT prophylaxis. ?  ?Dr. 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 Patient continues to be limited by left sided weakness with flexor tone and sensory deficits, generalized right sided weakness, dysarthria and ,working on pre-gait activity. CIR recommended due to functional decline.   Patient transferred to CIR on 05/27/2021 .  ? ?Patient currently requires max +2 with mobility secondary to muscle weakness and muscle joint tightness, decreased cardiorespiratoy endurance, motor apraxia, decreased attention to left and decreased motor planning, decreased awareness, decreased problem solving, decreased safety awareness, and decreased memory, and decreased sitting balance, decreased standing balance, decreased postural control, hemiplegia, and decreased balance strategies.  Prior to hospitalization, patient was modified independent  with mobility and lived with Spouse, Son in a House home.  Home access is  Level entry, Ramped entrance. ? ?Patient will benefit from skilled PT intervention to maximize safe functional mobility, minimize fall risk, and decrease caregiver burden for planned discharge home with 24 hour assist.  Anticipate patient will benefit from follow up [facility] at discharge. ? ?PT - End of Session ?Activity Tolerance: Tolerates < 10 min activity, no significant change in vital signs ?Endurance Deficit: Yes ?Endurance Deficit Description: limited standing due to fear and anxiety; fatigue reported during functional mobility tasks ?PT Assessment ?Rehab Potential (ACUTE/IP ONLY): Fair ?PT Barriers to Discharge: Insurance for SNF coverage;Weight;Decreased caregiver support ?  PT Patient demonstrates  impairments in the following area(s): Balance;Endurance;Motor;Nutrition;Safety ?PT Transfers Functional Problem(s): Bed Mobility;Car;Bed to Chair ?PT Locomotion Functional Problem(s): Ambulation;Wheelchair Mobility ?PT Plan ?PT Intensity: Minimum of 1-2 x/day ,45 to 90 minutes ?PT Frequency: 5 out of 7 days ?PT Duration Estimated Length of Stay: 3 weeks ?PT Treatment/Interventions: Ambulation/gait training;Community reintegration;Balance/vestibular training;Disease management/prevention;Functional electrical stimulation;Neuromuscular re-education;Patient/family education;Skin care/wound management;Therapeutic Exercise;UE/LE Coordination activities;Wheelchair propulsion/positioning;Stair training;Visual/perceptual remediation/compensation;UE/LE Strength taining/ROM;Therapeutic Activities;Splinting/orthotics;Psychosocial support;Pain management;Functional mobility training;DME/adaptive equipment instruction;Discharge planning;Cognitive remediation/compensation ?PT Transfers Anticipated Outcome(s): minA with LRAD ?PT Locomotion Anticipated Outcome(s): minA with LRAD but anticipate primarily will be wheelchair level ?PT Recommendation ?Recommendations for Other Services: Therapeutic Recreation consult;Neuropsych consult ?Therapeutic Recreation Interventions: Pet therapy;Stress management ?Follow Up Recommendations: Home health PT;24 hour supervision/assistance ?Patient destination: Home ?Equipment Recommended: To be determined ?Equipment Details: TBD - she owns a wheelchair and RW ? ? ?PT Evaluation ?Precautions/Restrictions ?Precautions ?Precautions: Fall ?Precaution Comments: left hemi, residual R sided weakness from 2017 CVA ?Restrictions ?Weight Bearing Restrictions: No ?Pain ?Pain Assessment ?Pain Scale: 0-10 ?Pain Score: 0-No pain ?Pain Interference ?Pain Interference ?Pain Effect on Sleep: 2. Occasionally ?Pain Interference with Therapy Activities: 1. Rarely or not at all ?Pain Interference with Day-to-Day  Activities: 1. Rarely or not at all ?Home Living/Prior Functioning ?Home Living ?Living Arrangements: Spouse/significant other;Children ?Available Help at Discharge: Family;Available 24 hours/day ?Type of Home: House ?Home Access: Level entry;Ramped entrance ?Home Layout: One level ?Bathroom Shower/Tub: Gaffer;Door ?Bathroom Accessibility: Yes ? Lives With: Spouse;Son ?Prior Function ?Level of Independence: Independent with basic ADLs;Independent with gait;Independent with transfers;Independent with homemaking with ambulation ? Able to Take Stairs?: No ?Driving: No ?Vocation: On disability ?Leisure: Hobbies-yes (Comment) (loves dogs, has 4, cooking, traveling) ?Vision/Perception  ?Vision - History ?Ability to See in Adequate Light: 0 Adequate ?Vision - Assessment ?Eye Alignment: Within Functional Limits ?Ocular Range of Motion: Within Functional Limits ?Alignment/Gaze Preference: Within Defined Limits ?Tracking/Visual Pursuits: Decreased smoothness of horizontal tracking;Decreased smoothness of vertical tracking;Requires cues, head turns, or add eye shifts to track ?Saccades: Additional head turns occurred during testing;Additional eye shifts occurred during testing ?Convergence: Within functional limits ?Perception ?Perception: Impaired ?Inattention/Neglect: Does not attend to left side of body (mild) ?Praxis ?Praxis: Intact  ?Cognition ?Overall Cognitive Status: Impaired/Different from baseline ?Arousal/Alertness: Awake/alert ?Orientation Level: Oriented X4 ?Year: 2023 ?Month: April ?Day of Week: Correct ?Attention: Selective ?Selective Attention: Impaired ?Selective Attention Impairment: Functional basic;Verbal basic ?Memory: Impaired ?Memory Impairment: Retrieval deficit;Decreased recall of new information ?Awareness: Appears intact ?Problem Solving: Impaired ?Problem Solving Impairment: Functional complex ?Executive Function: Decision Making;Self Correcting ?Decision Making: Impaired ?Decision Making  Impairment: Functional basic ?Self Correcting: Impaired ?Self Correcting Impairment: Functional basic ?Safety/Judgment: Appears intact ?Sensation ?Sensation ?Light Touch: Appears Intact ?Hot/Cold: Appears Intac

## 2021-05-28 NOTE — Progress Notes (Signed)
Inpatient Rehabilitation Center ?Individual Statement of Services ? ?Patient Name:  Gwendolyn Morales  ?Date:  05/28/2021 ? ?Welcome to the Inpatient Rehabilitation Center.  Our goal is to provide you with an individualized program based on your diagnosis and situation, designed to meet your specific needs.  With this comprehensive rehabilitation program, you will be expected to participate in at least 3 hours of rehabilitation therapies Monday-Friday, with modified therapy programming on the weekends. ? ?Your rehabilitation program will include the following services:  Physical Therapy (PT), Occupational Therapy (OT), Speech Therapy (ST), 24 hour per day rehabilitation nursing, Therapeutic Recreaction (TR), Neuropsychology, Care Coordinator, Rehabilitation Medicine, Nutrition Services, and Pharmacy Services ? ?Weekly team conferences will be held on Wednesday to discuss your progress.  Your Inpatient Rehabilitation Care Coordinator will talk with you frequently to get your input and to update you on team discussions.  Team conferences with you and your family in attendance may also be held. ? ?Expected length of stay: 3 weeks  Overall anticipated outcome: min assist level ? ?Depending on your progress and recovery, your program may change. Your Inpatient Rehabilitation Care Coordinator will coordinate services and will keep you informed of any changes. Your Inpatient Rehabilitation Care Coordinator's name and contact numbers are listed  below. ? ?The following services may also be recommended but are not provided by the Inpatient Rehabilitation Center:  ? ?Home Health Rehabiltiation Services ?Outpatient Rehabilitation Services ? ?  ?Arrangements will be made to provide these services after discharge if needed.  Arrangements include referral to agencies that provide these services. ? ?Your insurance has been verified to be:  [employer] Medicaid ?Your primary doctor is:  Myra Gianotti ? ?Pertinent information will be  shared with your doctor and your insurance company. ? ?Inpatient Rehabilitation Care Coordinator:  Dossie Der, LCSW [phone] or (C) [phone] ? ?Information discussed with and copy given to patient by: Lucy Chris, 05/28/2021, 2:13 PM    ?

## 2021-05-28 NOTE — Progress Notes (Signed)
?                                                       PROGRESS NOTE ? ? ?Subjective/Complaints: ?Blood pressure is elevated ?Discussed low potassium and she is willing to take supplement ?Discussed elevated creatinine and encouraged 6-8 glasses per day.  ? ?ROS: +shoulder pain ? ? ?Objective: ?  ?No results found. ?Recent Labs  ?  05/27/21 ?1542 05/28/21 ?9485  ?WBC 9.1 8.0  ?HGB 15.7* 14.8  ?HCT 48.8* 46.5*  ?PLT 372 330  ? ?Recent Labs  ?  05/27/21 ?1542 05/28/21 ?4627  ?NA 141 140  ?K 3.6 3.2*  ?CL 108 105  ?CO2 25 24  ?GLUCOSE 104* 94  ?BUN 19 15  ?CREATININE 1.03* 1.08*  ?CALCIUM 9.9 9.8  ? ? ?Intake/Output Summary (Last 24 hours) at 05/28/2021 1304 ?Last data filed at 05/28/2021 0700 ?Gross per 24 hour  ?Intake 694 ml  ?Output --  ?Net 694 ml  ?  ? ?  ? ?Physical Exam: ?Vital Signs ?Blood pressure (!) 150/101, pulse 72, temperature 98.5 ?F (36.9 ?C), temperature source Oral, resp. rate 18, height 5\' 10"  (1.778 m), weight 106.9 kg, SpO2 98 %. ?Gen: no distress, normal appearing ?HEENT: oral mucosa pink and moist, NCAT ?Cardio: Reg rate ?Chest: normal effort, normal rate of breathing ?Abd: soft, non-distended ?Ext: no edema ?Psych: pleasant, normal affect ?Neurologic: Cranial nerves II through XII intact, motor strength is 4/5 in right and 3 - Left deltoid, bicep, tricep, grip, hip flexor, knee extensors, ankle dorsiflexor and plantar flexor ?Sensory exam normal sensation to light touch and proprioception in bilateral upper and lower extremities ?Hypophonic dysarthria ?Musculoskeletal: Full range of motion in all 4 extremities. No joint swelling  ? Mental Status: She is alert.  ?   Sensory: Sensory deficit present.  ?   Motor: Weakness (mild left sided weakness after therapy in 2017--worse now) present.  ?   Comments: Left facial paresis with severe dysarthria with low voice volume--tends to keep mouth open. 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  ? ? ?Assessment/Plan: ?1. Functional deficits which  require 3+ hours per day of interdisciplinary therapy in a comprehensive inpatient rehab setting. ?Physiatrist is providing close team supervision and 24 hour management of active medical problems listed below. ?Physiatrist and rehab team continue to assess barriers to discharge/monitor patient progress toward functional and medical goals ? ?Care Tool: ? ?Bathing ?   ?   ?   ?  ?  ?Bathing assist Assist Level: Total Assistance - Patient < 25% ?  ?  ?Upper Body Dressing/Undressing ?Upper body dressing   ?  ?   ?Upper body assist Assist Level: Total Assistance - Patient < 25% ?   ?Lower Body Dressing/Undressing ?Lower body dressing ? ? ?   ?  ? ?  ? ?Lower body assist Assist for lower body dressing: Total Assistance - Patient < 25% ?   ? ?Toileting ?Toileting    ?Toileting assist Assist for toileting: Dependent - Patient 0% ?  ?  ?Transfers ?Chair/bed transfer ? ?Transfers assist ?   ? ?  ?  ?  ?Locomotion ?Ambulation ? ? ?Ambulation assist ? ?   ? ?  ?  ?   ? ?Walk 10 feet activity ? ? ?Assist ?   ? ?  ?   ? ?  Walk 50 feet activity ? ? ?Assist   ? ?  ?   ? ? ?Walk 150 feet activity ? ? ?Assist   ? ?  ?  ?  ? ?Walk 10 feet on uneven surface  ?activity ? ? ?Assist   ? ? ?  ?   ? ?Wheelchair ? ? ? ? ?Assist   ?  ?  ? ?  ?   ? ? ?Wheelchair 50 feet with 2 turns activity ? ? ? ?Assist ? ?  ?  ? ? ?   ? ?Wheelchair 150 feet activity  ? ? ? ?Assist ?   ? ? ?   ? ?Blood pressure (!) 150/101, pulse 72, temperature 98.5 ?F (36.9 ?C), temperature source Oral, resp. rate 18, height 5\' 10"  (1.778 m), weight 106.9 kg, SpO2 98 %. ? ?Medical Problem List and Plan: ?1. 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 ?            -patient may shower ?            -ELOS/Goals: 14-20d ? Admit to CIR ?2.  Antithrombotics: ?-DVT/anticoagulation:   Pharmaceutical: Lovenox ?            -antiplatelet therapy: ASA/Plavix X 3 weeks followed by ASA alone.  ?3. Pain Management: tylenol prn.  ?4. Mood: LCSW to follow for evaluation and support.  ?            -antipsychotic agents: N/A ?5. Neuropsych: This patient is capable of making decisions on her own behalf. ?6. Skin/Wound Care: Routine pressure relief measures.  ?7. Fluids/Electrolytes/Nutrition: Monitor I/O. Check BMET in am ?8. HTN: Monitor BP TID. Continue Norvasc 10 mg/day, hydralazine 25 mg every 8 hours, HCTZ 12.5 mg daily and metoprolol 100 mg BID. Supplement klor which will also help with HTN ? 10. CKD III: Monitor with routine checks. Recommended drinking 6-8 glasses of water per day ?11. E coli UTI: Ceftriaxone started on 04/12 with resolution of leucocytosis.  ?12. Elevated LDL: On high dose Lipitor  ?13. Chronic intermittent cough: Continue Mucinex BID ?14. Constipation: Will start miralax bid.  ?15. Undiagnosed OSA: To be started in sleep study per husband. ?16. Obesity: BMI 33.82: provide dietary education ?17. Hypokalemia: supplement klor ? ?LOS: ?1 days ?A FACE TO FACE EVALUATION WAS PERFORMED ? ?06/12 P Gwendolyn Morales ?05/28/2021, 1:04 PM  ? ?  ?

## 2021-05-28 NOTE — Progress Notes (Signed)
Inpatient Rehabilitation  Patient information reviewed and entered into eRehab system by Keiston Manley M. Ichael Pullara, M.A., CCC/SLP, PPS Coordinator.  Information including medical coding, functional ability and quality indicators will be reviewed and updated through discharge.    

## 2021-05-28 NOTE — Progress Notes (Signed)
Inpatient Rehabilitation Admission Medication Review by a Pharmacist ? ?A complete drug regimen review was completed for this patient to identify any potential clinically significant medication issues. ? ?High Risk Drug Classes Is patient taking? Indication by Medication  ?Antipsychotic Yes Prochlorperazine prn Nausea  ?Anticoagulant Yes Enoxaparin for DVT ppx   ?Antibiotic No   ?Opioid No   ?Antiplatelet Yes Aspirin and clopidogrel for stroke   ?Hypoglycemics/insulin No   ?Vasoactive Medication Yes Amlodipine, hydralazine, HCTZ, metoprolol tartrate for HTN   ?Chemotherapy No   ?Other Yes Atorvastatin for stroke ?Kcl for potassium repletion   ? ? ? ?Type of Medication Issue Identified Description of Issue Recommendation(s)  ?Drug Interaction(s) (clinically significant) ?    ?Duplicate Therapy ?    ?Allergy ?    ?No Medication Administration End Date ? Per neurology, continue aspirin for 3 weeks and clopidogrel indefinitely Added stop date 5/1 for aspirin   ?Incorrect Dose ?    ?Additional Drug Therapy Needed ?    ?Significant med changes from prior encounter (inform family/care partners about these prior to discharge). New clopidogrel Educate at discharge   ?Other ? Patient out of all HTN meds since august 2022 Needs new Rxs at discharge   ? ? ?Clinically significant medication issues were identified that warrant physician communication and completion of prescribed/recommended actions by midnight of the next day:  Yes and resolved per documentation above  ? ?Name of provider notified for urgent issues identified: Pam Love ? ?Provider Method of Notification: Secure chat  ? ? ? ?Pharmacist comments:  ? ?Time spent performing this drug regimen review (minutes):  20 ? ? ?Alphia Moh, PharmD, BCPS, BCCP ?Clinical Pharmacist ? ?Please check AMION for all [facility] Pharmacy phone numbers ?After 10:00 PM, call Main Pharmacy [phone] ? ?

## 2021-05-28 NOTE — Progress Notes (Signed)
Inpatient Rehabilitation Care Coordinator ?Assessment and Plan ?Patient Details  ?Name: Gwendolyn Morales ?MRN: [id number] ?Date of Birth: 1965/05/08 ? ?Today's Date: 05/28/2021 ? ?Hospital Problems: Principal Problem: ?  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  Stroke (cerebrum) (HCC) ? ?Past Medical History:  ?Past Medical History:  ?Diagnosis Date  ? Hypertension   ? Stroke [facility]) 07/2015  ? ICH with residual hemiparesis  ? ?Past Surgical History:  ?Past Surgical History:  ?Procedure Laterality Date  ? BUBBLE STUDY  05/24/2021  ? Procedure: BUBBLE STUDY;  Surgeon: Quintella Reichert, MD;  Location: [facility] ENDOSCOPY;  Service: Cardiovascular;;  ? CESAREAN SECTION    ? x 2  ? TEE WITHOUT CARDIOVERSION N/A 05/24/2021  ? Procedure: TRANSESOPHAGEAL ECHOCARDIOGRAM (TEE);  Surgeon: Quintella Reichert, MD;  Location: [facility] ENDOSCOPY;  Service: Cardiovascular;  Laterality: N/A;  ? ?Social History:  reports that she has never smoked. She has never used smokeless tobacco. She reports that she does not drink alcohol and does not use drugs. ? ?Family / Support Systems ?Marital Status: Married ?Patient Roles: Spouse, Parent ?Spouse/Significant Other: [name] [phone] ?Children: Colby-son [phone] ?Other Supports: Another son who is local also ?Anticipated Caregiver: Husband and son-Colby-28 yo ?Ability/Limitations of Caregiver: Husband works days but son is with her while husband is working ?Caregiver Availability: 24/7 ?Family Dynamics: Close knit family who pull together when one is in need. Pt has had their assistance since first CVA in 2017. She was doing well until this happened ? ?Social History ?Preferred language: English ?Religion:  ?Cultural Background: No issues ?Education: HS ?Health Literacy - How often do you need to have someone help you when you read instructions, pamphlets, or other written material from your doctor or pharmacy?: Rarely ?Writes: Yes ?Employment Status: Disabled ?Date Retired/Disabled/Unemployed:  2017 ?Legal History/Current Legal Issues: No issues ?Guardian/Conservator: None-according to MD pt is capable of making her own decisions while here  ? ?Abuse/Neglect ?Abuse/Neglect Assessment Can Be Completed: Yes ?Physical Abuse: Denies ?Verbal Abuse: Denies ?Sexual Abuse: Denies ?Exploitation of patient/patient's resources: Denies ?Self-Neglect: Denies ? ?Patient response to: ?Social Isolation - How often do you feel lonely or isolated from those around you?: Sometimes ? ?Emotional Status ?Pt's affect, behavior and adjustment status: Pt is motivated to do well and recover from this stroke. She as taking steps prior to this and was more self sufficent which she liked. She still needed someone there but not always hands on care. ?Recent Psychosocial Issues: other health issues-deficits from first CVA ?Psychiatric History: History of depression takes medications for this which she finds helpful, would benefit from seeing neuro-psych while here ?Substance Abuse History: No issues ? ?Patient / Family Perceptions, Expectations & Goals ?Pt/Family understanding of illness & functional limitations: Pt and husband can explain her stroke and deficits she has now. Both talk with the MD and feel they have a good understanding of her treatment plan moving forward. ?Premorbid pt/family roles/activities: Wife, MOm, retiree, friend, church member ?Anticipated changes in roles/activities/participation: resume ?Pt/family expectations/goals: Pt states: " I want to do well."  husband states: : " I am hopeful she will do well and make good progress while here." ? ?Community Resources ?Community Agencies: Other (Comment) ?Premorbid Home Care/DME Agencies: Other (Comment) (has wc, rwq, tub seat, bsc and had HH in the past) ?Transportation available at discharge: family members ?Is the patient able to respond to transportation needs?: Yes ?In the past 12 months, has lack of transportation kept you from medical appointments or from  getting medications?: No ?In the past  12 months, has lack of transportation kept you from meetings, work, or from getting things needed for daily living?: No ?Resource referrals recommended: Neuropsychology ? ?Discharge Planning ?Living Arrangements: Spouse/significant other, Children ?Support Systems: Children, Spouse/significant other, Other relatives, Friends/neighbors ?Type of Residence: Private residence ?Insurance Resources: [employer] (specify county) ?Financial Resources: SSI, Family Support ?Financial Screen Referred: No ?Living Expenses: Lives with family ?Money Management: Spouse ?Does the patient have any problems obtaining your medications?: No ?Home Management: Husband and son ?Patient/Family Preliminary Plans: Return home with husband and son who both assist with her care and will continue now. Pt has been on rehab before and knows the process and is happy to be here. ?Care Coordinator Barriers to Discharge: Insurance for SNF coverage, Medication compliance ?Care Coordinator Anticipated Follow Up Needs: HH/OP ? ?Clinical Impression ?Pleasant female who is motivated to do well and recover as much as she can from this CVA. She was here in 2017 after her first CVA and did well. She still required care from husband and son who are very committed to her. Will await therapy evaluations and work on discharge needs. ? ?Lucy Chris ?05/28/2021, 2:11 PM ? ?  ?

## 2021-05-28 NOTE — Plan of Care (Signed)
?  Problem: RH Expression Communication ?Goal: LTG Patient will increase speech intelligibility (SLP) ?Description: LTG: Patient will increase speech intelligibility at word/phrase/conversation level with cues, % of the time (SLP) ?Flowsheets (Taken 05/28/2021 1610) ?LTG: Patient will increase speech intelligibility (SLP): Supervision ?Level: ? Phrase ? Conversation level ?Percent of time patient will use intelligible speech: 90 ?  ?Problem: RH Problem Solving ?Goal: LTG Patient will demonstrate problem solving for (SLP) ?Description: LTG:  Patient will demonstrate problem solving for basic/complex daily situations with cues  (SLP) ?Flowsheets (Taken 05/28/2021 9604) ?LTG: Patient will demonstrate problem solving for (SLP): Complex daily situations ?LTG Patient will demonstrate problem solving for: Supervision ?  ?Problem: RH Attention ?Goal: LTG Patient will demonstrate this level of attention during functional activites (SLP) ?Description: LTG:  Patient will will demonstrate this level of attention during functional activites (SLP) ?Flowsheets (Taken 05/28/2021 5409) ?Patient will demonstrate during cognitive/linguistic activities the attention type of: Alternating ?Patient will demonstrate this level of attention during cognitive/linguistic activities in: Home ?LTG: Patient will demonstrate this level of attention during cognitive/linguistic activities with assistance of (SLP): Supervision ?  ?

## 2021-05-28 NOTE — Evaluation (Signed)
Speech Language Pathology Assessment and Plan ? ?Patient Details  ?Name: Gwendolyn Morales ?MRN: [id number] ?Date of Birth: 20-Apr-1965 ? ?SLP Diagnosis: Dysarthria;Cognitive Impairments  ?Rehab Potential: Good ?ELOS: ~2 weeks  ? ?Today's Date: 05/28/2021 ?SLP Individual Time: 0800-0900 ?SLP Individual Time Calculation (min): 60 min ? ?Hospital Problem: Principal Problem: ?  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  Stroke (cerebrum) (HCC) ? ?Past Medical History:  ?Past Medical History:  ?Diagnosis Date  ? Hypertension   ? Stroke [facility]) 07/2015  ? ICH with residual hemiparesis  ? ?Past Surgical History:  ?Past Surgical History:  ?Procedure Laterality Date  ? BUBBLE STUDY  05/24/2021  ? Procedure: BUBBLE STUDY;  Surgeon: Quintella Reichert, MD;  Location: [facility] ENDOSCOPY;  Service: Cardiovascular;;  ? CESAREAN SECTION    ? x 2  ? TEE WITHOUT CARDIOVERSION N/A 05/24/2021  ? Procedure: TRANSESOPHAGEAL ECHOCARDIOGRAM (TEE);  Surgeon: Quintella Reichert, MD;  Location: [facility] ENDOSCOPY;  Service: Cardiovascular;  Laterality: N/A;  ? ? ?Assessment / Plan / Recommendation ?Clinical Impression  Patient is a 56 year old RH-female with history of malignant HTN, left parietal ICH 2017 and received inpatient rehab services 07/24/2015 - 08/17/2015, mild left sided weakness.  Per chart review patient lives with spouse and family.  1 level home with ramped entrance.  Family did assist with some ADLs.  Presented 06/01/21 after awaking with left sided weakness of acute onset. BP elevated at admission and CTA head/neck was negative for LVO and showed 50% stenosis of L-VA and 30$ stenosis B-VA V4 segment. MRI brain showed advanced small vessel disease with acute small vessel deep white matter infarcts in anterior and posterior right lateral ventricle and posterior body of left lateral ventricle. TCD showed moderate size right to left shunt. TEE showed normal LVH, was negative for thrombus and showed severe mitral valve calcification with trivial AV  sclerosis and mild grade 2 atherosclerotic plaque in thoracic and ascending aorta.  ? ?Dr. [name] felt that stroke likely due to secondary cryptogenic v/s vasculitis source and recommends DAPT X 3 weeks followed by ASA alone. Patient continues to be limited by left sided weakness with flexor tone and sensory deficits, generalized right sided weakness, dysarthria and ,working on pre-gait activity. CIR recommended due to functional decline.   Patient transferred to CIR on 05/27/2021 .  ? ?Pt presents with moderate dysarthria characterized by hypophonic speech, imprecise articulation 2' left sided weakness and rapid rate of speech. Pt presents with poor awareness of motor language impact on communication and requires consistent cues for use of intelligibility strategies. Intelligibility at phrase level <50%. Pt observed utilizing nonverbal means of communication with nursing staff to aid in communication of wants/needs (thumbs up and down, etc). Pt encouraged to continue to utilize gestures as needed with staff, particularly in noisy environment due to severely diminished vocal intensity. Unsure patient's baseline motor speech impairments from 2017 CVA but pt reports significant exacerbation of dysarthria. Sign hung in room to increase pt recall and implementation of compensatory intelligibility strategies. ? ?Pt presents with mild cognitive impairment primarily in areas of attention, problem solving, and awareness. Pt reports baseline mild memory impairments and states there is no change. In acute, pt with a score of 26/30 on the MOCA. SLP administering portions of ALFA assessment: "Counting Money" - 60% and "Solving Daily Math Problems" - 70%. Pt reports a decline in speed in completing these subtests, also states she has noticed a changed in her sustained attention since CVA. Pt is responsible for medication management,  finances and appointments and would like to maintain this independence, son and husband assist with  all other ADLs. Pt will benefit from skilled ST during CIR to increase safety and independence with daily routine and increase speech intelligibility for functional communication. ?  ?Skilled Therapeutic Interventions          Pt participating in cognitive-linguistic evaluation. Please see above.  ?SLP Assessment ? Patient will need skilled Speech Lanaguage Pathology Services during CIR admission  ?  ?Recommendations ? SLP Diet Recommendations: Age appropriate regular solids;Thin ?Liquid Administration via: Cup;Straw ?Supervision:  (set up A) ?Oral Care Recommendations: Oral care BID ?Patient destination: Home ?Follow up Recommendations: Home Health SLP ?Equipment Recommended: None recommended by SLP  ?  ?SLP Frequency 3 to 5 out of 7 days   ?SLP Duration ? ?SLP Intensity ? ?SLP Treatment/Interventions ~2 weeks ? ?Minumum of 1-2 x/day, 30 to 90 minutes ? ?Cognitive remediation/compensation;Cueing hierarchy;Functional tasks;Patient/family education;Internal/external aids;Speech/Language facilitation;Therapeutic Exercise;Therapeutic Activities   ? ?Pain ?Pain Assessment ?Pain Scale: 0-10 ?Pain Score: 0-No pain ? ?Prior Functioning ?Cognitive/Linguistic Baseline: Baseline deficits ?Baseline deficit details: previous CVA with speech deficits ongoing ?Type of Home: House ? Lives With: Spouse;Son ?Available Help at Discharge: Family;Available 24 hours/day ?Education: HS, used to work at [employer] ?Vocation: On disability ? ?SLP Evaluation ?Cognition ?Overall Cognitive Status: Impaired/Different from baseline ?Arousal/Alertness: Awake/alert ?Orientation Level: Oriented X4 ?Year: 2023 ?Month: April ?Day of Week: Correct ?Attention: Selective ?Selective Attention: Impaired ?Selective Attention Impairment: Functional basic;Verbal basic ?Memory: Impaired ?Memory Impairment: Retrieval deficit (baseline per patient) ?Awareness: Appears intact ?Problem Solving: Impaired ?Problem Solving Impairment: Functional  complex ?Safety/Judgment: Appears intact  ?Comprehension ?Auditory Comprehension ?Overall Auditory Comprehension: Appears within functional limits for tasks assessed ?Yes/No Questions: Within Functional Limits ?Commands: Within Functional Limits ?Conversation: Complex ?Visual Recognition/Discrimination ?Discrimination: Within Function Limits ?Reading Comprehension ?Reading Status: Not tested ?Expression ?Expression ?Primary Mode of Expression: Verbal ?Verbal Expression ?Overall Verbal Expression: Impaired (2' dysarthria) ?Initiation: No impairment ?Repetition: No impairment ?Naming: No impairment ?Pragmatics: No impairment ?Interfering Components: Speech intelligibility ?Written Expression ?Dominant Hand: Right ?Oral Motor ?Oral Motor/Sensory Function ?Overall Oral Motor/Sensory Function: Moderate impairment ?Facial ROM: Reduced left ?Facial Symmetry: Abnormal symmetry left ?Facial Strength: Reduced left ?Lingual ROM: Reduced left ?Lingual Symmetry: Abnormal symmetry left;Abnormal symmetry right;Suspected CN XII (hypoglossal) dysfunction ?Lingual Strength: Reduced;Suspected CN XII (hypoglossal) dysfunction ?Velum: Within Functional Limits ?Motor Speech ?Overall Motor Speech: Impaired ?Respiration: Impaired ?Level of Impairment: Phrase ?Resonance: Within functional limits ?Articulation: Impaired ?Level of Impairment: Word ?Intelligibility: Intelligibility reduced ?Word: 75-100% accurate ?Phrase: 50-74% accurate ?Sentence: 25-49% accurate ?Conversation: 25-49% accurate ?Motor Planning: Witnin functional limits ?Interfering Components: Premorbid status ?Effective Techniques: Pacing;Over-articulate;Increased vocal intensity ? ?Care Tool ?Care Tool Cognition ?Ability to hear (with hearing aid or hearing appliances if normally used Ability to hear (with hearing aid or hearing appliances if normally used): 0. Adequate - no difficulty in normal conservation, social interaction, listening to TV ?  ?Expression of Ideas and  Wants Expression of Ideas and Wants: 2. Frequent difficulty - frequently exhibits difficulty with expressing needs and ideas ?  ?Understanding Verbal and Non-Verbal Content Understanding Verbal and Non-Verbal Content: 3. U

## 2021-05-28 NOTE — Progress Notes (Signed)
Patient ID: Gwendolyn Morales, female   DOB: April 10, 1965, 56 y.o.   MRN: [id number] ?Met with the patient to review rehab process, team conference and plan of care. Reviewed secondary risk factors including HTN , DAPT x 3 wks then Plavix solo per neurology with dietary modification recommendations. Patient reports goal is to improve her strength in left side and voice intensity. Reported she sleeps in a recliner chair at home and gets up to a lift chair. Son available to help when spouse is at work. Constipation addressed; working on continence of bladder with toileting. Continue to follow along to discharge to address educational needs to facilitate preparation for discharge. Dorien Chihuahua B ? ?

## 2021-05-28 NOTE — Progress Notes (Signed)
Notified Mcarthur Rossetti Angiulli, PA-C of patient's blood pressures during the shift. Per PA continue to monitor blood pressure; no new orders. ? ?Tilden Dome, LPN ?

## 2021-05-29 ENCOUNTER — Inpatient Hospital Stay (HOSPITAL_COMMUNITY): Payer: Medicaid Other

## 2021-05-29 MED ORDER — MAGNESIUM GLUCONATE 500 MG PO TABS
250.0000 mg | ORAL_TABLET | Freq: Every day | ORAL | Status: DC
  Administered 2021-05-29 – 2021-06-02 (×5): 250 mg via ORAL
  Filled 2021-05-29 (×5): qty 1

## 2021-05-29 NOTE — Progress Notes (Signed)
Patient ID: Gwendolyn Morales, female   DOB: 1965-12-15, 56 y.o.   MRN: [id number]  Met with pt and spoke with husband via telephone to discuss team conference goals of min-mod wheelchair level and target discharge date of 5/9. Husband is working during the day but up here at night if MD/PA needs him to cal him and he can come in. He reports some of her equipment needs ot be replaced if she ois eligible for this-rolling walker and tub seat. Will make sure over five years old and can replace this. Husband was asking about if she needs a sleep machine since she had a sleep study a few weeks ago. Will need to follow up with MD regarding this. Will continue to follow and work on discharge needs. ?

## 2021-05-29 NOTE — Plan of Care (Signed)
?  Problem: Consults ?Goal: RH STROKE PATIENT EDUCATION ?Description: See Patient Education module for education specifics  ?Outcome: Progressing ?  ?Problem: RH BOWEL ELIMINATION ?Goal: RH STG MANAGE BOWEL WITH ASSISTANCE ?Description: STG Manage Bowel with mod I Assistance. ?Outcome: Progressing ?Goal: RH STG MANAGE BOWEL W/MEDICATION W/ASSISTANCE ?Description: STG Manage Bowel with Medication with mod I Assistance. ?Outcome: Progressing ?  ?Problem: RH BLADDER ELIMINATION ?Goal: RH STG MANAGE BLADDER WITH ASSISTANCE ?Description: STG Manage Bladder With toileting Assistance ?Outcome: Progressing ?  ?Problem: RH SAFETY ?Goal: RH STG ADHERE TO SAFETY PRECAUTIONS W/ASSISTANCE/DEVICE ?Description: STG Adhere to Safety Precautions With cues Assistance/Device. ?Outcome: Progressing ?  ?Problem: RH PAIN MANAGEMENT ?Goal: RH STG PAIN MANAGED AT OR BELOW PT'S PAIN GOAL ?Description: At or below level 4 with prns ?Outcome: Progressing ?  ?Problem: RH KNOWLEDGE DEFICIT ?Goal: RH STG INCREASE KNOWLEDGE OF HYPERTENSION ?Description: Patient and spouse will be able to manage HTN with medications, dietary modifications using handouts and educational resources independently ?Outcome: Progressing ?Goal: RH STG INCREASE KNOWLEGDE OF HYPERLIPIDEMIA ?Description: Patient and spouse will be able to manage HLD with medications, dietary modifications using handouts and educational resources independently ?Outcome: Progressing ?Goal: RH STG INCREASE KNOWLEDGE OF STROKE PROPHYLAXIS ?Description: Patient and spouse will be able to manage secondary stroke risks with medications, dietary modifications using handouts and educational resources independently ?Outcome: Progressing ?  ?

## 2021-05-29 NOTE — Progress Notes (Signed)
Physical Therapy Session Note ? ?Patient Details  ?Name: Gwendolyn Morales ?MRN: [id number] ?Date of Birth: 01/08/1966 ? ?Today's Date: 05/29/2021 ?PT Individual Time: 1135-1200 + [id number] ?PT Individual Time Calculation (min): 25 min  + 60 min (missed 15 minutes due to fatigue) ? ?Short Term Goals: ?Week 1:  PT Short Term Goal 1 (Week 1): Pt will complete bed mobility with modA ?PT Short Term Goal 2 (Week 1): Pt will complete bed<>chair transfer with maxA and LRAD ?PT Short Term Goal 3 (Week 1): Pt will initiate gait training ?PT Short Term Goal 4 (Week 1): Pt will tolerate sitting OOB in chair for >2 hours outside of therapy ? ?Skilled Therapeutic Interventions/Progress Updates:  ?   ? ?1st session: ?Pt supine in bed to start and agreeable to PT tx - reports 5/10 R knee pain. Reviewed XR results with her - showing patellofemoral OA and no acute fx. Pt relieved with results. We also discussed DC date (5/9) and pt seems pleased with this. Due to time constraints and no +2 assist - completed bed level there-ex and will work on mobilizing OOB in the PM session. Completed the following: ?-2x8 ankle pumps bilaterally (AAROM on L) ?-2x8 heel slides bilaterally (AAROM on L) ?-2x8 hip abduction on L (AAROM) ?-2x4 SAQ on L (AAROM) ?-heel cord stretching on L and hamstring stretching on L. Pt lacking ~5-10 deg to neutral in ankle DF on L - and also lacking ~10 deg terminal knee ext on L.   ? ?Pt remained supine in bed at end of session, bed alarm on, all needs met.  ? ?2nd session: ?Pt met supine in bed - agreeable to therapy session and denies pain. She remains extremely hypophonic and difficulty to understand. Used communication board in her room at times to assist with communication.  ? ?Retrieved Jeralene Huff and +2 assist from nursing to mobilize pt OOB. Pt appearing anxious and apprehensive regarding leaving the bed and required encouragement and reassurance to help lower anxiety. She required maxA for supine<>sitting with  hospital bed features - totalA for forward scooting to EOB. Sitting balance with supervision while unsupported with posterior pelvic tilt in sitting. Required elevated EOB and +2 max/totalA for standing in the [place] - difficult to place flaps down due to limited upright posture with inadequate hip extension. Dependant transfer to w/c in [place]. ? ?Transported pt outdoors in wheelchair to help reduce stress and anxiety. While outdoors, completed passive stretching of her L hamstring and heel cord. We also discussed DC planning and PLOF due to uncertainty regarding her level of independence. She reports she would occasionally use the lift chair but ambulated only short household distances with her RW and no assist. She confirms using a wheelchair while outdoors as well.  ? ?Returned upstairs to [employer]. Worked on transitioning to standing using the [place] but believe she may do better with the [employer] - will trial tomorrow or later date when +2 assist is available. Worked on forward leaning and weight shifting with the Denna Haggard to improve her understanding of natural movement for sit<>stand transitions. Attempted standing with only +1 assist but unable to safely complete. Required +2 totalA for sit<>stand in [place] in the rehab gym and once she was sitting in perched position, she began to c/o feeling "weird" "dizzy" and slightly nauseous. Provided cool rag and time to acclimate to perched position but no improvement. Returned to her room in the [place] and BP assessed 147/104 (118) HR 88. She requested to  return to bed due to fatigue. +2 [place] transfer and +2 assist for sit>supine. BP reassessed in supine 120/83 (95) HR 72. Pt reporting improvement in sx. Remained supine in bed with all needs met, bed alarm on, call bell in reach. She missed 15 minutes due to fatigue.  ? ? 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  ? ?Therapy/Group: Individual Therapy ? ?Gwendolyn Morales Gwendolyn Morales ?05/29/2021, 7:34 AM  ?

## 2021-05-29 NOTE — Progress Notes (Signed)
Speech Language Pathology Daily Session Note ? ?Patient Details  ?Name: Gwendolyn Morales ?MRN: [id number] ?Date of Birth: [birth year] ? ?Today's Date: 05/29/2021 ?SLP Individual Time: 0900-1000 ?SLP Individual Time Calculation (min): 60 min ? ?Short Term Goals: ?Week 1: SLP Short Term Goal 1 (Week 1): Pt will utilize speech intelligibility strategies as trained with overall Mod A cues for 50% intelligibility at phrase level ?SLP Short Term Goal 2 (Week 1): Pt will complete divided attention during functional tasks with min A cues ?SLP Short Term Goal 3 (Week 1): Pt will solve mildly complex problems with min A cues 80% accuracy ?SLP Short Term Goal 4 (Week 1): Pt will utilize strategies as trained to increase error awareness during cognitive and communication tasks with min A cues ? ?Skilled Therapeutic Interventions: ?Pt seen, this AM, for skilled ST intervention targeting cognitive-linguistic goals outlined above. Pt encountered awake/alert and lying semi-reclined in bed. 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 Writing, gesturing, and communication board were effective means of communication to augment pt's verbal message; left low-tech AAC boards in room. Agreeable to ST intervention at bedside. ? ?Facilitated completion of functional cognitive task targeting executive functioning, alternating attention, and recall (medication management) by providing Mod verbal and visual A for planning and comprehension of task instructions; however, successfully faded cues to Supervision A as task progressed. Pt required Mod verbal and visual A to identify error in filling pill organizer for 1 out of 6 medications. Participated in convergent and divergent reasoning task r/t medication management given overall Mod I. Provided ongoing compensatory strategy training re: use of speech intelligibility strategies, which pt implemented  given Mod verbal and visual A to increase vocal intensity, state single words at a time, utilize diaphragmatic breathing, and utilize augmentative means of communication when unable to successfully communicate her intended message. Pt verbalized understanding of education.  ? ?Session concluded with pt in bed, bed alarm on, call bell reviewed and within reach, and all immediate needs met. Continues per current ST POC. ? ?Pain ?No/Denies pain; NAD ? ?Therapy/Group: Individual Therapy ? ?Kameron Blethen A Alby Schwabe ?05/29/2021, 11:46 AM ?

## 2021-05-29 NOTE — Patient Care Conference (Signed)
Inpatient RehabilitationTeam Conference and Plan of Care Update ?Date: 05/29/2021   Time: 11:04 AM  ? ? ?Patient Name: Gwendolyn Morales      ?Medical Record Number: [id number]  ?Date of Birth: 11-17-1965 ?Sex: Female         ?Room/Bed: 4M06C/4M06C-01 ?Payor Info: Payor: [place] MEDICA[employer]PAID [employer]ALTH PLAN / Plan: [place] MEDICA[employer] [place] / Product Type: *No Product type* /   ? ?Admit Date/Time:  05/27/2021  2:44 PM ? ?Primary Diagnosis:  Subcortical infarction (HCC) ? ?Hospital Problems: Principal Problem: ?  Subcortical infarction [facility]) ?Active Problems: ?  Stroke (cerebrum) (HCC) ? ? ? ?Expected Discharge Date: Expected Discharge Date: 06/18/21 ? ?Team Members Present: ?Physician leading conference: Dr. Sula Soda ?Social Worker Present: Dossie Der, LCSW ?Nurse Present: Chana Bode, RN ?PT Present: Wynelle Link, PT ?OT Present: Dolphus Jenny, OT ?SLP Present: Other (comment) Sarita Bottom, SLP) ? ?   Current Status/Progress Goal Weekly Team Focus  ?Bowel/Bladder ? ? Pt is incontinent of bowel/bladder  Pt will gain continence of bowel/bladder  Will assess qshift and PRN   ?Swallow/Nutrition/ Hydration ? ?           ?ADL's ? ? +2 stedy standing, EOB/bed level self care needing min-mod for UB ADLs and max to total for LB ADLs; limited by significant right knee pain, hypertonicity in LUE and tight hamstrings in LLE hindering ability to shift trunk anteriorly in prep for power up to stand, also limited by fear of falling and generalized anxiety.  min assist  functional transfers, neuro re-ed, self care training, dc planning, pt education   ?Mobility ? ? maxA bed mobility, supervision sitting balance, max/totalA +2 for standing using Stedy. Unable to progress gait to date. Moderately anxious and fearful of falling  min to modA -  have ambulation goals but anticipate will be primarily wheelchair level  LLE NMR, improved activity tolerance, sit<>stands, functional transfers, bed mobility, pt education,  gait as able   ?Communication ? ? Max A  Supervision A for 90% intelligibility at phrase and conversation level  speech intelligilbility strategy training with structured tasks   ?Safety/Cognition/ Behavioral Observations ? Min A  Supervision A - complex daily situation problem-solving and alternating attention  functional tasks involving money and medication management   ?Pain ? ? Pt is currently pain free  Pt will remain pain free  Will assess qshift and PRN   ?Skin ? ? Pt has redness on her bottom and groin  Pt's redness will clear up  Will assess qshift and PRN   ? ? ?Discharge Planning:  ?Home with husband who works during the day and adult son who assists while husband works. Pt had 24/7 care PTA   ?Team Discussion: ?Patient with right knee pain; voltaren gel ordered. MD adjusting HTN medications. Progress limited by fatigue, anxiety and weakness with fear of falling. Also is aphonic; no voice. ? ?Patient on target to meet rehab goals: ?yes, currently needs max assist for loer body care and mi - mod for uper body. Needs mas assist for bed mobility and supervision for sitting. Requires max assist for standing and remains in a crouched position. Hx of sleeping in a recliner chair, gets up to a lift chair and pivots/steps to [facility] during the day. Goals for discharge set for min assist overall and mod assist for PT.  ? ?*See Care Plan and progress notes for long and short-term goals.  ? ?Revisions to Treatment Plan:  ?ENT referral OP ?RMT ?Hemi techniques ?  Communication board trial ?  ?Teaching Needs: ?Safety, medications, secondary risk management, transfers, toileting, etc  ?Current Barriers to Discharge: ?Decreased caregiver support and Home enviroment access/layout ? ?Possible Resolutions to Barriers: ?Family education ?DME: W/C ? ?  ? ? Medical Summary ?Current Status: spasticity, uncontrollled hypertension, obesity, hypokalemia, right knee pain, anxiety ? Barriers to Discharge: Medical stability ? Barriers to  Discharge Comments: spasticity, uncontrollled hypertension, obesity, hypokalemia,  right knee pain, anxiety ?Possible Resolutions to [employer]: continue baclofen, discuss botox outpatient, range of motion and heating pad, check XR right knee, continue voltaren gel, continue hydroxyzine prn ? ? ?Continued Need for Acute Rehabilitation Level of Care: The patient requires daily medical management by a physician with specialized training in physical medicine and rehabilitation for the following reasons: ?Direction of a multidisciplinary physical rehabilitation program to maximize functional independence : Yes ?Medical management of patient stability for increased activity during participation in an intensive rehabilitation regime.: Yes ?Analysis of laboratory values and/or radiology reports with any subsequent need for medication adjustment and/or medical intervention. : Yes ? ? ?I attest that I was present, lead the team conference, and concur with the assessment and plan of the team. ? ? ?Chana Bode B ?05/29/2021, 4:55 PM  ? ? ? ? ? ? ?

## 2021-05-29 NOTE — Progress Notes (Signed)
?                                                       PROGRESS NOTE ? ? ?Subjective/Complaints: ?Diastolic BP still elevated--will start magnesium gluconate HS- will help blood pressure and constipation- receiving Miralax BID for constipation ? ?ROS: +shoulder pain, +constipation ? ? ?Objective: ?  ?No results found. ?Recent Labs  ?  05/27/21 ?1542 05/28/21 ?3212  ?WBC 9.1 8.0  ?HGB 15.7* 14.8  ?HCT 48.8* 46.5*  ?PLT 372 330  ? ?Recent Labs  ?  05/27/21 ?1542 05/28/21 ?2482  ?NA 141 140  ?K 3.6 3.2*  ?CL 108 105  ?CO2 25 24  ?GLUCOSE 104* 94  ?BUN 19 15  ?CREATININE 1.03* 1.08*  ?CALCIUM 9.9 9.8  ? ? ?Intake/Output Summary (Last 24 hours) at 05/29/2021 0719 ?Last data filed at 05/28/2021 1200 ?Gross per 24 hour  ?Intake 118 ml  ?Output --  ?Net 118 ml  ?  ? ?  ? ?Physical Exam: ?Vital Signs ?Blood pressure (!) 134/95, pulse 70, temperature 98.1 ?F (36.7 ?C), temperature source Oral, resp. rate 18, height 5\' 10"  (1.778 m), weight 106.9 kg, SpO2 95 %. ?Gen: no distress, normal appearing, BMI 33.82 ?HEENT: oral mucosa pink and moist, NCAT ?Cardio: Reg rate ?Chest: normal effort, normal rate of breathing ?Abd: soft, non-distended ?Ext: no edema ?Psych: pleasant, normal affect ?Neurologic: Cranial nerves II through XII intact, motor strength is 4/5 in right and 3 - Left deltoid, bicep, tricep, grip, hip flexor, knee extensors, ankle dorsiflexor and plantar flexor ?Sensory exam normal sensation to light touch and proprioception in bilateral upper and lower extremities ?Hypophonic dysarthria ?Musculoskeletal: Full range of motion in all 4 extremities. No joint swelling  ? Mental Status: She is alert.  ?   Sensory: Sensory deficit present.  ?   Motor: Weakness (mild left sided weakness after therapy in 2017--worse now) present.  ?   Comments: Left facial paresis with severe dysarthria with low voice volume--tends to keep mouth open. 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  ? ? ?Assessment/Plan: ?1. Functional  deficits which require 3+ hours per day of interdisciplinary therapy in a comprehensive inpatient rehab setting. ?Physiatrist is providing close team supervision and 24 hour management of active medical problems listed below. ?Physiatrist and rehab team continue to assess barriers to discharge/monitor patient progress toward functional and medical goals ? ?Care Tool: ? ?Bathing ?   ?   ?   ?  ?  ?Bathing assist Assist Level: Total Assistance - Patient < 25% ?  ?  ?Upper Body Dressing/Undressing ?Upper body dressing   ?  ?   ?Upper body assist Assist Level: Total Assistance - Patient < 25% ?   ?Lower Body Dressing/Undressing ?Lower body dressing ? ? ?   ?  ? ?  ? ?Lower body assist Assist for lower body dressing: Total Assistance - Patient < 25% ?   ? ?Toileting ?Toileting    ?Toileting assist Assist for toileting: Dependent - Patient 0% ?  ?  ?Transfers ?Chair/bed transfer ? ?Transfers assist ?   ? ?Chair/bed transfer assist level: Dependent - Patient 0% ) ?  ?  ?Locomotion ?Ambulation ? ? ?Ambulation assist ? ? Ambulation activity did not occur: Safety/medical concerns ? ?  ?  ?   ? ?Walk 10 feet activity ? ? ?  Assist ? Walk 10 feet activity did not occur: Safety/medical concerns ? ?  ?   ? ?Walk 50 feet activity ? ? ?Assist Walk 50 feet with 2 turns activity did not occur: Safety/medical concerns ? ?  ?   ? ? ?Walk 150 feet activity ? ? ?Assist Walk 150 feet activity did not occur: Safety/medical concerns ? ?  ?  ?  ? ?Walk 10 feet on uneven surface  ?activity ? ? ?Assist Walk 10 feet on uneven surfaces activity did not occur: Safety/medical concerns ? ? ?  ?   ? ?Wheelchair ? ? ? ? ?Assist Is the patient using a wheelchair?: Yes ?Type of Wheelchair: Manual ?  ? ?Wheelchair assist level: Dependent - Patient 0% ?Max wheelchair distance: 143ft  ? ? ?Wheelchair 50 feet with 2 turns activity ? ? ? ?Assist ? ?  ?  ? ? ?Assist Level: Dependent - Patient 0%  ? ?Wheelchair 150 feet activity  ? ? ? ?Assist ?    ? ? ?Assist Level: Dependent - Patient 0%  ? ?Blood pressure (!) 134/95, pulse 70, temperature 98.1 ?F (36.7 ?C), temperature source Oral, resp. rate 18, height 5\' 10"  (1.778 m), weight 106.9 kg, SpO2 95 %. ? ?Medical Problem List and Plan: ?1. 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 ?            -patient may shower ?            -ELOS/Goals: 14-20d Min/Mod A ? HFU scheduled ? -Interdisciplinary Team Conference today   ?2.  Antithrombotics: ?-DVT/anticoagulation:  Pharmaceutical: Lovenox ?            -antiplatelet therapy: ASA/Plavix X 3 weeks followed by ASA alone.  ?3. Shoulder pain: tylenol prn. voltaren gel ordered.  ?4. Mood: LCSW to follow for evaluation and support.  ?            -antipsychotic agents: N/A ?5. Neuropsych: This patient is capable of making decisions on her own behalf. ?6. Skin/Wound Care: Routine pressure relief measures.  ?7. Fluids/Electrolytes/Nutrition: Monitor I/O. ?8. HTN: Monitor BP TID. Continue Norvasc 10 mg/day, hydralazine 25 mg every 8 hours, HCTZ 12.5 mg daily and metoprolol 100 mg BID. Supplement klor which will also help with HTN. Add magnesium gluconate 250mg  HS ? 10. CKD III: Monitor with routine checks. Recommended drinking 6-8 glasses of water per day ?11. E coli UTI: Ceftriaxone started on 04/12 with resolution of leucocytosis.  ?12. Elevated LDL: On high dose Lipitor  ?13. Chronic intermittent cough: Continue Mucinex BID ?14. Constipation: Will start miralax bid. Add magnesium gluconate 250mg  HS ?15. Undiagnosed OSA: To be started in sleep study per husband. ?16. Obesity: BMI 33.82: provide dietary education ?17. Hypokalemia: supplement klor ?18. Right knee pain: XR ordered. Apply voltaren gel ?19. Aphonic: continue SLP. Outpatient ENT follow-up. Respiratory muscle  training ? ?LOS: ?2 days ?A FACE TO FACE EVALUATION WAS PERFORMED ? ? P Shira Bobst ?05/29/2021, 7:19 AM  ? ?  ?

## 2021-05-29 NOTE — Progress Notes (Signed)
Occupational Therapy Session Note ? ?Patient Details  ?Name: Gwendolyn Morales ?MRN: [id number] ?Date of Birth: 10-Feb-1966 ? ?Today's Date: 05/29/2021 ?OT Individual Time: 8182-9937 ?OT Individual Time Calculation (min): 60 min  ? ? ?Short Term Goals: ?Week 1:  OT Short Term Goal 1 (Week 1): Pt will complete sit<>stand with max assist in prep for ADLs ?OT Short Term Goal 2 (Week 1): Pt will complete LB dressing with mod assist ?OT Short Term Goal 3 (Week 1): Pt will complete UB dressing with mod assist. ?OT Short Term Goal 4 (Week 1): Pt will complete squat pivot toilet transfer with mod assist. ? ? ?Skilled Therapeutic Interventions/Progress Updates:  ?  Pt semi reclined in bed, reporting feeling anxious this morning and having "things" on her mind.  Pt agreeable to functional transfer training and self care.  Pt c/o 10/10 pain in right knee upon moving however and nurse and MD made aware.  Right roll and sidelying to sit with mod assist.  Pt attempted standing at stedy however very hesitant to lean trunk anteriorly in order to shift weight over BOS despite max manual cues noted resisting.  Pt also having difficulty activating hip extensors for power up despite total assist efforts from therapist.  Pt reporting pain in knee therefore returned to supine with max assist and repositioned for comfort.  Pt agreeable to bed level self care needing max assist overall including bathing UB/LB, doffing/donning gown, and donning pants rolling left and right using bed features.  All needs in reach, bed alarm on at end of session. ? 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 ? ?Therapy/Group: Individual Therapy ? ?Dian Situ Tyese Finken ?05/29/2021, 12:08 PM ?

## 2021-05-29 NOTE — Evaluation (Signed)
Recreational Therapy Assessment and Plan ? ?Patient Details  ?Name: Gwendolyn Morales ?MRN: [id number] ?Date of Birth: Feb 27, 1965 ?Today's Date: 05/29/2021 ? ?Rehab Potential:  Good ?ELOS:   3 weeks ? ?Assessment ?Hospital Problem: Principal Problem: ?  Subcortical infarction [facility]) ?Active Problems: ?  Stroke (cerebrum) ([place]) ?  ?  ?Past Medical History:  ?    ?Past Medical History:  ?Diagnosis Date  ? Hypertension    ? Stroke [facility]) 07/2015  ?  ICH with residual hemiparesis  ?  ?Past Surgical History:  ?     ?Past Surgical History:  ?Procedure Laterality Date  ? BUBBLE STUDY   05/24/2021  ?  Procedure: BUBBLE STUDY;  Surgeon: Sueanne Margarita, MD;  Location: [place];  Service: Cardiovascular;;  ? CESAREAN SECTION      ?  x 2  ? TEE WITHOUT CARDIOVERSION N/A 05/24/2021  ?  Procedure: TRANSESOPHAGEAL ECHOCARDIOGRAM (TEE);  Surgeon: Sueanne Margarita, MD;  Location: [place];  Service: Cardiovascular;  Laterality: N/A;  ?  ?  ?Assessment & Plan ?Clinical Impression: Gwendolyn Morales is a 56 year old RH-female with history of malignant HTN, left parietal ICH 2017 and received inpatient rehab services 07/24/2015 - 08/17/2015, mild left sided weakness.  Per chart review patient lives with spouse and family.  1 level home with ramped entrance.  Family did assist with some ADLs.  Presented 06/01/21 after awaking with left sided weakness of acute onset. BP elevated at admission and CTA head/neck was negative for LVO and showed 50% stenosis of L-VA and 30$ stenosis B-VA V4 segment. MRI brain showed advanced small vessel disease with acute small vessel deep white matter infarcts in anterior and posterior right lateral ventricle and posterior body of left lateral ventricle. TCD showed moderate size right to left shunt. TEE showed normal LVH, was negative for thrombus and showed severe mitral valve calcification with trivial AV sclerosis and mild grade 2 atherosclerotic plaque in thoracic and ascending aorta. BLE dopplers were  negative for DVT.  Maintained on Lovenox for DVT prophylaxis. ?  ?Dr. 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 Patient continues to be limited by left sided weakness with flexor tone and sensory deficits, generalized right sided weakness, dysarthria and ,working on pre-gait activity. .  Patient transferred to CIR on 05/27/2021.   ?  ?Pt presents with decreased activity tolerance, decreased functional mobility, decreased balance, left neglect, decreased initiation, decreased problem solving, decreased memory, and delayed processing, dysarthria, hypophonic speech, feelings of stress Limiting pt's independence with leisure/community pursuits. ? ?Plan ? Min 1 TR session during LOS >20 minutes ? ?Recommendations for other services: None  ? ?Discharge Criteria: Patient will be discharged from TR if patient refuses treatment 3 consecutive times without medical reason.  If treatment goals not met, if there is a change in medical status, if patient makes no progress towards goals or if patient is discharged from hospital. ? ?The above assessment, treatment plan, treatment alternatives and goals were discussed and mutually agreed upon: by patient ? ?Gwendolyn Morales ?05/29/2021, 3:28 PM  ?

## 2021-05-30 LAB — BASIC METABOLIC PANEL
Anion gap: 7 (ref 5–15)
BUN: 20 mg/dL (ref 6–20)
CO2: 25 mmol/L (ref 22–32)
Calcium: 10.8 mg/dL — ABNORMAL HIGH (ref 8.9–10.3)
Chloride: 109 mmol/L (ref 98–111)
Creatinine, Ser: 1.21 mg/dL — ABNORMAL HIGH (ref 0.44–1.00)
GFR, Estimated: 53 mL/min — ABNORMAL LOW (ref >60)
Glucose, Bld: 114 mg/dL — ABNORMAL HIGH (ref 70–99)
Potassium: 3.6 mmol/L (ref 3.5–5.1)
Sodium: 141 mmol/L (ref 135–145)

## 2021-05-30 MED ORDER — LIDOCAINE 5 % EX PTCH
1.0000 | MEDICATED_PATCH | CUTANEOUS | Status: DC
  Administered 2021-05-30 – 2021-06-18 (×19): 1 via TRANSDERMAL
  Filled 2021-05-30 (×20): qty 1

## 2021-05-30 NOTE — Progress Notes (Signed)
Physical Therapy Session Note ? ?Patient Details  ?Name: Gwendolyn Morales ?MRN: [id number] ?Date of Birth: 1965/11/19 ? ?Today's Date: 05/30/2021 ?PT Individual Time: 3267-1245 ?PT Individual Time Calculation (min): 59 min  ? ?Short Term Goals: ?Week 1:  PT Short Term Goal 1 (Week 1): Pt will complete bed mobility with modA ?PT Short Term Goal 2 (Week 1): Pt will complete bed<>chair transfer with maxA and LRAD ?PT Short Term Goal 3 (Week 1): Pt will initiate gait training ?PT Short Term Goal 4 (Week 1): Pt will tolerate sitting OOB in chair for >2 hours outside of therapy ? ?Skilled Therapeutic Interventions/Progress Updates:  ?   ?RN assisting pt via Stedy for transferring back to bed due to episode of bladder incontinence. Assisted RN with Charlaine Dalton transfer +2 assist and +2 assist required for supine<>sitting. Dependant assist for brief change and pericare for time management - rolling to her L in bed with modA and totalA needed to roll R. Donned clean scrub pants with dependant +2 assist for time.  ? ?Retrieved Clarise Cruz Plus to assist with mobilizing pt safely OOB. Provided demonstration to help calm her anxiety regarding mobility and better her understanding for safe transfer - pt agreeable. [place] for [employer] with totalA and +2 assist for safety. Completed Clarise Cruz Plus transfer with dependant assist and pt feeling comfortable with transfer to the w/c using the [employer]. Transported to 28M rehab gym and we focused remainder of time on Standing in [place] to promote weight bearing through BLE, engage posterior chain, and activate hip extensors. Pt standing tolerance ~4 minutes per stand using [employer].  ? ?Educated Therapist, sports at end of session on [employer] and updated safety plan in her room. Pt requesting to remain seated in w/c at end of session - all needs met with safety belt alarm on.  ? ?Therapy Documentation ?Precautions:  ?Precautions ?Precautions: Fall ?Precaution Comments: left hemi, residual R sided weakness  from 2017 CVA ?Restrictions ?Weight Bearing Restrictions: No ?General: ?  ? ? ?Therapy/Group: Individual Therapy ? ?Jessicia Napolitano P Ahlivia Salahuddin ?05/30/2021, 7:24 AM  ?

## 2021-05-30 NOTE — Progress Notes (Signed)
?                                                       PROGRESS NOTE ? ? ?Subjective/Complaints: ?Discussed that XR of right knee shows severe patellofemoral arthritis- diclofenac gel is helping but she would like something stronger for pain ? ?ROS: +shoulder pain, +constipation, +right knee pain ? ? ?Objective: ?  ?DG Knee 1-2 Views Right ? ?Result Date: 05/29/2021 ?CLINICAL DATA:  Right knee pain. EXAM: RIGHT KNEE - 1-2 VIEW COMPARISON:  None. FINDINGS: Severe patellofemoral joint space narrowing and peripheral degenerative osteophytes. Small joint effusion. Minimal medial and lateral compartment joint space narrowing and peripheral osteophytosis. No acute fracture or dislocation. IMPRESSION: Severe patellofemoral osteoarthritis. Electronically Signed   By: Yvonne Kendall M.D.   On: 05/29/2021 09:00   ?Recent Labs  ?  05/27/21 ?1542 05/28/21 ?[id number]  ?WBC 9.1 8.0  ?HGB 15.7* 14.8  ?HCT 48.8* 46.5*  ?PLT 372 330  ? ?Recent Labs  ?  05/27/21 ?1542 05/28/21 ?[id number]  ?NA 141 140  ?K 3.6 3.2*  ?CL 108 105  ?CO2 25 24  ?GLUCOSE 104* 94  ?BUN 19 15  ?CREATININE 1.03* 1.08*  ?CALCIUM 9.9 9.8  ? ? ?Intake/Output Summary (Last 24 hours) at 05/30/2021 1246 ?Last data filed at 05/30/2021 [id number] ?Gross per 24 hour  ?Intake 240 ml  ?Output --  ?Net 240 ml  ?  ? ?  ? ?Physical Exam: ?Vital Signs ?Blood pressure (!) 124/93, pulse 79, temperature 98.2 ?F (36.8 ?C), resp. rate 16, height 5\' 10"  (1.778 m), weight 106.9 kg, SpO2 96 %. ?Gen: no distress, normal appearing, BMI 33.82 ?HEENT: oral mucosa pink and moist, NCAT ?Cardio: Reg rate ?Chest: normal effort, normal rate of breathing ?Abd: soft, non-distended ?Ext: no edema ?Psych: pleasant, normal affect ?Neurologic: Cranial nerves II through XII intact, motor strength is 4/5 in right and 3 - Left deltoid, bicep, tricep, grip, hip flexor, knee extensors, ankle dorsiflexor and plantar flexor ?Sensory exam normal sensation to light touch and proprioception in bilateral upper and lower  extremities ?Hypophonic dysarthria ?Musculoskeletal: Full range of motion in all 4 extremities. No joint swelling  ?Right knee TTP ? Mental Status: She is alert.  ?   Sensory: Sensory deficit present.  ?   Motor: Weakness (mild left sided weakness after therapy in 2017--worse now) present.  ?   Comments: Left facial paresis with severe dysarthria with low voice volume--tends to keep mouth open. 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  ? ? ?Assessment/Plan: ?1. Functional deficits which require 3+ hours per day of interdisciplinary therapy in a comprehensive inpatient rehab setting. ?Physiatrist is providing close team supervision and 24 hour management of active medical problems listed below. ?Physiatrist and rehab team continue to assess barriers to discharge/monitor patient progress toward functional and medical goals ? ?Care Tool: ? ?Bathing ?   ?   ? Body parts bathed by helper: Buttocks, Front perineal area ?  ?  ?Bathing assist Assist Level: Dependent - Patient 0% ?  ?  ?Upper Body Dressing/Undressing ?Upper body dressing   ?What is the patient wearing?: [place] only ?   ?Upper body assist Assist Level: Dependent - Patient 0% ?   ?Lower Body Dressing/Undressing ?Lower body dressing ? ? ?   ?What is the patient wearing?: [place] only ? ?  ? ?  Lower body assist Assist for lower body dressing: Dependent - Patient 0% ?   ? ?Toileting ?Toileting    ?Toileting assist Assist for toileting: Dependent - Patient 0% ?  ?  ?Transfers ?Chair/bed transfer ? ?Transfers assist ?   ? ?Chair/bed transfer assist level: Dependent - Patient 0% Charlaine Dalton) ?  ?  ?Locomotion ?Ambulation ? ? ?Ambulation assist ? ? Ambulation activity did not occur: Safety/medical concerns ? ?  ?  ?   ? ?Walk 10 feet activity ? ? ?Assist ? Walk 10 feet activity did not occur: Safety/medical concerns ? ?  ?   ? ?Walk 50 feet activity ? ? ?Assist Walk 50 feet with 2 turns activity did not occur: Safety/medical concerns ? ?  ?   ? ? ?Walk  150 feet activity ? ? ?Assist Walk 150 feet activity did not occur: Safety/medical concerns ? ?  ?  ?  ? ?Walk 10 feet on uneven surface  ?activity ? ? ?Assist Walk 10 feet on uneven surfaces activity did not occur: Safety/medical concerns ? ? ?  ?   ? ?Wheelchair ? ? ? ? ?Assist Is the patient using a wheelchair?: Yes ?Type of Wheelchair: Manual ?  ? ?Wheelchair assist level: Dependent - Patient 0% ?Max wheelchair distance: 136ft  ? ? ?Wheelchair 50 feet with 2 turns activity ? ? ? ?Assist ? ?  ?  ? ? ?Assist Level: Dependent - Patient 0%  ? ?Wheelchair 150 feet activity  ? ? ? ?Assist ?   ? ? ?Assist Level: Dependent - Patient 0%  ? ?Blood pressure (!) 124/93, pulse 79, temperature 98.2 ?F (36.8 ?C), resp. rate 16, height 5\' 10"  (1.778 m), weight 106.9 kg, SpO2 96 %. ? ?Medical Problem List and Plan: ?1. 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 ?            -patient may shower ?            -ELOS/Goals: 14-20d Min/Mod A ? HFU scheduled ? Sent message to team that patient would prefer earlier d/c date ?2.  Antithrombotics: ?-DVT/anticoagulation:  Pharmaceutical: Lovenox ?            -antiplatelet therapy: ASA/Plavix X 3 weeks followed by ASA alone.  ?3. Shoulder pain: tylenol prn. voltaren gel ordered.  ?4. Mood: LCSW to follow for evaluation and support.  ?            -antipsychotic agents: N/A ?5. Neuropsych: This patient is capable of making decisions on her own behalf. ?6. Skin/Wound Care: Routine pressure relief measures.  ?7. Fluids/Electrolytes/Nutrition: Monitor I/O. ?8. HTN: Monitor BP TID. Continue Norvasc 10 mg/day, hydralazine 25 mg every 8 hours, HCTZ 12.5 mg daily and metoprolol 100 mg BID. Supplement klor which will also help with HTN. Add magnesium gluconate 250mg  HS ? 10. CKD III: Monitor with routine  checks. Recommended drinking 6-8 glasses of water per day ?11. E coli UTI: Ceftriaxone started on 04/12 with resolution of leucocytosis.  ?12. Elevated LDL: On high dose Lipitor  ?13. Chronic intermittent cough: Continue Mucinex BID ?14. Constipation: Will start miralax bid. Add magnesium gluconate 250mg  HS ?15. Undiagnosed OSA: To be started in sleep study per husband. ?16. Obesity: BMI 33.82: provide dietary education ?17. Hypokalemia: supplement klor. Repeat BMP today ?18. Right knee pain: XR shows severe patellofemoral arthritis. Lidocaine patch ordered. Apply  voltaren gel ?19. Aphonic: continue SLP. Outpatient ENT follow-up. Respiratory muscle training ?20. ?OSA: will need to follow-up with outpatient pulmonologist ? ?LOS: ?3 days ?A FACE TO FACE EVALUATION WAS PERFORMED ? ?Gwendolyn Morales ?05/30/2021, 12:46 PM  ? ?  ?

## 2021-05-30 NOTE — Progress Notes (Signed)
Occupational Therapy Session Note ? ?Patient Details  ?Name: Gwendolyn Morales ?MRN: [id number] ?Date of Birth: [birth year] ? ?Today's Date: 05/30/2021 ?OT Individual Time: 3154-0086 ?OT Individual Time Calculation (min): 60 min  ? ? ?Short Term Goals: ?Week 1:  OT Short Term Goal 1 (Week 1): Pt will complete sit<>stand with max assist in prep for ADLs ?OT Short Term Goal 2 (Week 1): Pt will complete LB dressing with mod assist ?OT Short Term Goal 3 (Week 1): Pt will complete UB dressing with mod assist. ?OT Short Term Goal 4 (Week 1): Pt will complete squat pivot toilet transfer with mod assist. ? ?Skilled Therapeutic Interventions/Progress Updates:  ?  Pt semi reclined, smiling and agreeable to OT session.  Pt reports pain in right knee only 4/10 today.  Gentle prolonged PROM to left hamstring in supine knee flexed then knee extended, bilateral hip flexors in sidelying to pt's tolerance completed in preparation for transfer training. Pt then instructed on bridging active hip extension in supine 2 x 10 reps.  Pt then completed right roll and sidelying to sit with mod assist.  Beasy board transfer with total assist +2.  Approached sink and doffed shirt with min assist.  Bathed UB with mod assist.  Donned shirt with min assist using hemi strategy.  Positioned at bedside in w/c, call bell and needs in reach, seat alarm on.   ? 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 ? ? ?Therapy/Group: Individual Therapy ? ?Gwendolyn Morales ?05/30/2021, 11:55 AM ?

## 2021-05-30 NOTE — Progress Notes (Signed)
Speech Language Pathology Daily Session Note ? ?Patient Details  ?Name: Clover Feehan ?MRN: [id number] ?Date of Birth: [birth year] ? ?Today's Date: 05/30/2021 ?SLP Individual Time: 3391-7921 ?SLP Individual Time Calculation (min): 60 min ? ?Short Term Goals: ?Week 1: SLP Short Term Goal 1 (Week 1): Pt will utilize speech intelligibility strategies as trained with overall Mod A cues for 50% intelligibility at phrase level ?SLP Short Term Goal 2 (Week 1): Pt will complete divided attention during functional tasks with min A cues ?SLP Short Term Goal 3 (Week 1): Pt will solve mildly complex problems with min A cues 80% accuracy ?SLP Short Term Goal 4 (Week 1): Pt will utilize strategies as trained to increase error awareness during cognitive and communication tasks with min A cues ? ?Skilled Therapeutic Interventions: ?Pt seen, this AM, for skilled ST intervention targeting cognitive-linguistic goals outlined above. Pt encountered awake/alert and OOB in w/c. NT observed today's session. Agreeable to ST intervention. ? ?SLP facilitated today's session by providing skilled re-education and overall Max multimodal A for implementation of compensatory speech intelligibility strategies during therapeutic and functional communication tasks. With aforementioned cueing, pt achieved ~50% intelligible at word level, during structured articulation task, with bilabial /b/ and alveolar /d/ phonemes.  ? ?Max verbal and visual A for emergent and anticipatory awareness re: need for AAC when communication breakdown occurred initially, though began to utilized independently as the session progressed.  Fatigue noted towards the middle of the session, with pt relying more on augmentative communication method of writing; did not utilize picture board. It should be noted that cues, for implementation of speech intelligibility strategies, were successfully faded from Max multimodal A to Min visual A following mass practice with strategies.  Attended to card game for 10 minutes with Supervision A ? ?Session concluded with pt in w/c, safety belt donned, call bell reviewed and within reach, and all immediate needs met. Continue per current ST POC.  ? ?Pain ?No/Denies pain; NAD ? ?Therapy/Group: Individual Therapy ? ?Barrington Worley A Brodric Schauer ?05/30/2021, 12:49 PM ?

## 2021-05-31 NOTE — Progress Notes (Signed)
Physical Therapy Session Note ? ?Patient Details  ?Name: Gwendolyn Morales ?MRN: [id number] ?Date of Birth: 05-31-1965 ? ?Today's Date: 05/31/2021 ?PT Individual Time: 2119-4174 ?PT Individual Time Calculation (min): 58 min  ? ?Short Term Goals: ?Week 1:  PT Short Term Goal 1 (Week 1): Pt will complete bed mobility with modA ?PT Short Term Goal 2 (Week 1): Pt will complete bed<>chair transfer with maxA and LRAD ?PT Short Term Goal 3 (Week 1): Pt will initiate gait training ?PT Short Term Goal 4 (Week 1): Pt will tolerate sitting OOB in chair for >2 hours outside of therapy ? ?Skilled Therapeutic Interventions/Progress Updates:  ?   ?Pt presenting supine in bed to start - agreeable to PT tx and denies pain. Focused session on standing tolerance, standing balance, endurance training, and improving overall activity tolerance. Supine<>sitting EOB with HOB elevated and maxA for trunk and BLE management. Able to sit EOB unsupported with CGA. Completed [place] Plus transfer with +2 assist for safety from EOB to w/c with instruction for her to engage LE as much as possible during standing portion of Sara Plus transfer. Transported to main rehab gym and assisted to mat table in similar manner. Completed several standing tasks while in the [place] Plus to promote weight bearing in LE, upright/erect posture. Tasks including ball taps to rehab tech with PT facilitating hip extension in standing, 2x10 hip thrusts in standing to facilitate posterior chain and glut activation, and tossing beach ball to basketball goal. Pt required seated rest breaks throughout tasks and asked "why do I lose my energy so quickly?" Explained stroke recovery, stages of healing. Returned to her room and she requested to remain seated in w/c. Safety belt alarm on, all needs in reach, call bell in lap.  ? 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  ? ?Therapy/Group: Individual Therapy ? ?Sencere Symonette P Markeita Alicia ?05/31/2021, 7:33 AM  ?

## 2021-05-31 NOTE — Progress Notes (Signed)
Occupational Therapy Session Note ? ?Patient Details  ?Name: Gwendolyn Morales ?MRN: [id number] ?Date of Birth: Mar 17, 1965 ? ?Today's Date: 05/31/2021 ?OT Individual Time: [id number] ?OT Individual Time Calculation (min): 60 min  ? ? ?Short Term Goals: ?Week 1:  OT Short Term Goal 1 (Week 1): Pt will complete sit<>stand with max assist in prep for ADLs ?OT Short Term Goal 2 (Week 1): Pt will complete LB dressing with mod assist ?OT Short Term Goal 3 (Week 1): Pt will complete UB dressing with mod assist. ?OT Short Term Goal 4 (Week 1): Pt will complete squat pivot toilet transfer with mod assist. ? ? ?Skilled Therapeutic Interventions/Progress Updates:  ?  Pt semi reclined in bed, already dressed, reports "just a little sore" in right knee but agreeable to OT session.  Pt reports feeling dry in brief however OT assessed and noted heavy soiling of urine.  Total assist pericare and clothing management completed with pt rolling left and right using bed features.  Pt needed min assist to roll to left and max assist to roll right.  Barrier cream applied to buttocks over sacral region due to redness of skin noted.  Antifungal powder applied to lower abdominal crease also due to redness.  Pt completed sidyling to sit with mod assist to EOB.  Total assist +2 beasy board to w/c.  Pt transported to ortho gym and completed sit<>stand and standing tolerance using standing frame to increase proprioceptive input to LUE and LLE as well as try to reduce fear of falling and increase overall confidence.  Pt initially reporting feeling nervous upon standing and right hand clenching at tabletop of standing frame.  Provided soothing music and verbal cueing to redirect and reduce anxiety.  Pt reports standing frame overall feels uncomfortable like she is getting "smushed" and reports preferring sara lift for sit<>stand transfers.  Returned pt to room and agreeable to remain sitting up at end of session.  Call bell in reach, seat alarm on.    ? 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 ? ? ?Therapy/Group: Individual Therapy ? ?Caryl Asp Airiana Elman ?05/31/2021, 1:10 PM ?

## 2021-05-31 NOTE — Progress Notes (Signed)
Orthopedic Tech Progress Note ?Patient Details:  ?Gwendolyn Morales ?12/12/1965 ?[id number] ? ?Patient ID: Gwendolyn Morales, female   DOB: May 26, 1965, 56 y.o.   MRN: [id number] ?Order routed over to [facility]. ?Gwendolyn Morales ?05/31/2021, 12:55 PM ? ?

## 2021-05-31 NOTE — Progress Notes (Signed)
Speech Language Pathology Daily Session Note ? ?Patient Details  ?Name: Gwendolyn Morales ?MRN: [id number] ?Date of Birth: Oct 23, 1965 ? ?Today's Date: 05/31/2021 ?SLP Individual Time: 1101-1200 ?SLP Individual Time Calculation (min): 59 min ? ?Short Term Goals: ?Week 1: SLP Short Term Goal 1 (Week 1): Pt will utilize speech intelligibility strategies as trained with overall Mod A cues for 50% intelligibility at phrase level ?SLP Short Term Goal 2 (Week 1): Pt will complete divided attention during functional tasks with min A cues ?SLP Short Term Goal 3 (Week 1): Pt will solve mildly complex problems with min A cues 80% accuracy ?SLP Short Term Goal 4 (Week 1): Pt will utilize strategies as trained to increase error awareness during cognitive and communication tasks with min A cues ? ?Skilled Therapeutic Interventions: ?Pt seen for skilled ST with focus on cognitive and speech goals, pt upright in wheelchair and agreeable to therapeutic tasks. Pt reports utilizing written communication to supplement verbalizations as needed, denies much use of picture communication board. Pt benefiting from overall max A cues to utilize compensatory intelligibility strategies, initially pt intelligility <25% at word level, increased to ~40% with mass practice. SLP facilitating sustained and divided attention task by providing overall min A cues, pt attending to word search for 20 minutes. Pt left in wheelchair with all needs within reach, cont ST POC.  ? ?Pain ?Pain Assessment ?Pain Scale: 0-10 ?Pain Score: 0-No pain ? ?Therapy/Group: Individual Therapy ? ?Tacey Ruiz ?05/31/2021, 11:55 AM ?

## 2021-05-31 NOTE — Progress Notes (Signed)
The patient is injury-free, afebrile, alert, and oriented X 3. Vital signs were within the baseline during this shift. Pt denies chest pain, SOB, nausea, vomiting, dizziness, signs or symptoms of bleeding or infection, or acute changes during this shift. We will continue to monitor and work toward achieving the care plan goals. ?

## 2021-05-31 NOTE — Progress Notes (Signed)
?                                                       PROGRESS NOTE ? ? ?Subjective/Complaints: ?No new complaints this morning ?Will order resting hand splint ?Knee and shoulder pain have both improved ?Cr increased to 1.21 , repeat Monday ? ?ROS: +shoulder pain, +constipation, +right knee pain- improved ? ? ?Objective: ?  ?No results found. ?No results for input(s): WBC, HGB, HCT, PLT in the last 72 hours. ? ?Recent Labs  ?  05/30/21 ?1438  ?NA 141  ?K 3.6  ?CL 109  ?CO2 25  ?GLUCOSE 114*  ?BUN 20  ?CREATININE 1.21*  ?CALCIUM 10.8*  ? ? ?Intake/Output Summary (Last 24 hours) at 05/31/2021 1226 ?Last data filed at 05/31/2021 0755 ?Gross per 24 hour  ?Intake 360 ml  ?Output --  ?Net 360 ml  ?  ? ?  ? ?Physical Exam: ?Vital Signs ?Blood pressure 116/76, pulse 68, temperature 98.2 ?F (36.8 ?C), temperature source Oral, resp. rate 16, height 5\' 10"  (1.778 m), weight 106.9 kg, SpO2 96 %. ?Gen: no distress, normal appearing, BMI 33.82 ?HEENT: oral mucosa pink and moist, NCAT ?Cardio: Reg rate ?Chest: normal effort, normal rate of breathing ?Abd: soft, non-distended ?Ext: no edema ?Psych: pleasant, normal affect ?Neurologic: Cranial nerves II through XII intact, motor strength is 4/5 in right and 3 - Left deltoid, bicep, tricep, grip, hip flexor, knee extensors, ankle dorsiflexor and plantar flexor ?Sensory exam normal sensation to light touch and proprioception in bilateral upper and lower extremities ?Hypophonic dysarthria ?Musculoskeletal: Full range of motion in all 4 extremities. No joint swelling  ?Right knee TTP ? Mental Status: She is alert.  ?   Sensory: Sensory deficit present.  ?   Motor: Weakness (mild left sided weakness after therapy in 2017--worse now) present.  ?   Comments: Left facial paresis with severe dysarthria with low voice volume--tends to keep mouth open. 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  ?Beasy board transfer total Ax2 ? ? ?Assessment/Plan: ?1. Functional deficits which require 3+  hours per day of interdisciplinary therapy in a comprehensive inpatient rehab setting. ?Physiatrist is providing close team supervision and 24 hour management of active medical problems listed below. ?Physiatrist and rehab team continue to assess barriers to discharge/monitor patient progress toward functional and medical goals ? ?Care Tool: ? ?Bathing ?   ?   ? Body parts bathed by helper: Buttocks, Front perineal area ?  ?  ?Bathing assist Assist Level: Dependent - Patient 0% ?  ?  ?Upper Body Dressing/Undressing ?Upper body dressing   ?What is the patient wearing?: [facility] gown only ?   ?Upper body assist Assist Level: Dependent - Patient 0% ?   ?Lower Body Dressing/Undressing ?Lower body dressing ? ? ?   ?What is the patient wearing?: Hospital gown only ? ?  ? ?Lower body assist Assist for lower body dressing: Dependent - Patient 0% ?   ? ?Toileting ?Toileting    ?Toileting assist Assist for toileting: Dependent - Patient 0% ?  ?  ?Transfers ?Chair/bed transfer ? ?Transfers assist ?   ? ?Chair/bed transfer assist level: Dependent - Patient 0% ) ?  ?  ?Locomotion ?Ambulation ? ? ?Ambulation assist ? ? Ambulation activity did not occur: Safety/medical concerns ? ?  ?  ?   ? ?  Walk 10 feet activity ? ? ?Assist ? Walk 10 feet activity did not occur: Safety/medical concerns ? ?  ?   ? ?Walk 50 feet activity ? ? ?Assist Walk 50 feet with 2 turns activity did not occur: Safety/medical concerns ? ?  ?   ? ? ?Walk 150 feet activity ? ? ?Assist Walk 150 feet activity did not occur: Safety/medical concerns ? ?  ?  ?  ? ?Walk 10 feet on uneven surface  ?activity ? ? ?Assist Walk 10 feet on uneven surfaces activity did not occur: Safety/medical concerns ? ? ?  ?   ? ?Wheelchair ? ? ? ? ?Assist Is the patient using a wheelchair?: Yes ?Type of Wheelchair: Manual ?  ? ?Wheelchair assist level: Dependent - Patient 0% ?Max wheelchair distance: 189ft  ? ? ?Wheelchair 50 feet with 2 turns activity ? ? ? ?Assist ? ?  ?   ? ? ?Assist Level: Dependent - Patient 0%  ? ?Wheelchair 150 feet activity  ? ? ? ?Assist ?   ? ? ?Assist Level: Dependent - Patient 0%  ? ?Blood pressure 116/76, pulse 68, temperature 98.2 ?F (36.8 ?C), temperature source Oral, resp. rate 16, height 5\' 10"  (1.778 m), weight 106.9 kg, SpO2 96 %. ? ?Medical Problem List and Plan: ?1. 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 ?            -patient may shower ?            -ELOS/Goals: 14-20d Min/Mod A ? HFU scheduled ? Therapy notes reviewed, requiring total Ax2 for transfers ?2.  Antithrombotics: ?-DVT/anticoagulation:  Pharmaceutical: Lovenox ?            -antiplatelet therapy: ASA/Plavix X 3 weeks followed by ASA alone.  ?3. Shoulder pain: tylenol prn. voltaren gel ordered.  ?4. Mood: LCSW to follow for evaluation and support.  ?            -antipsychotic agents: N/A ?5. Neuropsych: This patient is capable of making decisions on her own behalf. ?6. Skin/Wound Care: Routine pressure relief measures.  ?7. Fluids/Electrolytes/Nutrition: Monitor I/O. ?8. HTN: Monitor BP TID. Continue Norvasc 10 mg/day, hydralazine 25 mg every 8 hours, and metoprolol 100 mg BID. Supplement klor which will also help with HTN. Add magnesium gluconate 250mg  HS. D/c hctz given good control and bump in creatinine ? 10. CKD III: Monitor with routine checks. Recommended drinking 6-8 glasses of water per day. D/c HCTX given worsening renal function. ?11. E coli UTI: Ceftriaxone started on 04/12 with resolution of leucocytosis.  ?12. Elevated LDL: On high dose Lipitor  ?13. Chronic intermittent cough: Continue Mucinex BID ?14. Constipation: Will start miralax bid. Add magnesium gluconate 250mg  HS ?15. Undiagnosed OSA: To be started in sleep study per husband. ?16. Obesity: BMI 33.82: provide dietary  education ?17. Hypokalemia: supplement klor. Repeat BMP today ?18. Right knee pain: XR shows severe patellofemoral arthritis. Lidocaine patch ordered. Apply voltaren gel, improved, continue this regimen ?19. Aphonic: continue SLP. Outpatient ENT follow-up. Discussed with patient. Respiratory muscle training ?20. ?OSA: will need to follow-up with outpatient pulmonologist ? ?LOS: ?4 days ?A FACE TO FACE EVALUATION WAS PERFORMED ? ? P Mandell Pangborn ?05/31/2021, 12:26 PM  ? ?  ?

## 2021-05-31 NOTE — IPOC Note (Signed)
Overall Plan of Care (IPOC) ?Patient Details ?Name: Gwendolyn Morales ?MRN: [id number] ?DOB: Oct 20, 1965 ? ?Admitting Diagnosis: Subcortical infarction (HCC) ? ?Hospital Problems: Principal Problem: ?  Subcortical infarction [facility]) ?Active Problems: ?  Stroke (cerebrum) (HCC) ? ? ? ? Functional Problem List: ?Nursing Bladder, Pain, Endurance, Bowel, Medication Management  ?PT Balance, Endurance, Motor, Nutrition, Safety  ?OT Balance, Perception, Safety, Cognition, Edema, Endurance, Motor, Pain, Vision, Skin Integrity  ?SLP Cognition, Motor  ?TR    ?    ? Basic ADL?s: ?OT Grooming, Bathing, Dressing, Toileting  ? ?  Advanced  ADL?s: ?OT    ?   ?Transfers: ?PT Bed Mobility, Car, Bed to Chair  ?OT Toilet, Tub/Shower  ? ?  Locomotion: ?PT Ambulation, Wheelchair Mobility  ? ?  Additional Impairments: ?OT Fuctional Use of Upper Extremity  ?SLP Communication, Social Cognition ?expression ?Problem Solving, Attention, Awareness  ?TR    ? ? ?Anticipated Outcomes ?Item Anticipated Outcome  ?Self Feeding    ?Swallowing ?   ?  ?Basic self-care ? CGA-min assist  ?Toileting ? min assist ?  ?Bathroom Transfers CGA  ?Bowel/Bladder ? manage bowel w mod I and bladder w toileting  ?Transfers ? minA with LRAD  ?Locomotion ? minA with LRAD but anticipate primarily will be wheelchair level  ?Communication ? Supervision A  ?Cognition ? Supervision A  ?Pain ? pain at or below level 4 with prns  ?Safety/Judgment ? maintain safety w cues  ? ?Therapy Plan: ?PT Intensity: Minimum of 1-2 x/day ,45 to 90 minutes ?PT Frequency: 5 out of 7 days ?PT Duration Estimated Length of Stay: 3 weeks ?OT Intensity: Minimum of 1-2 x/day, 45 to 90 minutes ?OT Frequency: 5 out of 7 days ?OT Duration/Estimated Length of Stay: 3 weeks ?SLP Intensity: Minumum of 1-2 x/day, 30 to 90 minutes ?SLP Frequency: 3 to 5 out of 7 days ?SLP Duration/Estimated Length of Stay: ~2 weeks  ? ?Due to the current state of emergency, patients may not be receiving their 3-hours of  Medicare-mandated therapy. ? ? Team Interventions: ?Nursing Interventions Bladder Management, Disease Management/Prevention, Medication Management, Discharge Planning, Pain Management, Bowel Management, Patient/Family Education  ?PT interventions Ambulation/gait training, Community reintegration, Warden/ranger, Disease management/prevention, Functional electrical stimulation, Neuromuscular re-education, Patient/family education, Skin care/wound management, Therapeutic Exercise, UE/LE Coordination activities, Wheelchair propulsion/positioning, Stair training, Visual/perceptual remediation/compensation, UE/LE Strength taining/ROM, Therapeutic Activities, Splinting/orthotics, Psychosocial support, Pain management, Functional mobility training, DME/adaptive equipment instruction, Discharge planning, Cognitive remediation/compensation  ?OT Interventions Balance/vestibular training, Discharge planning, Functional electrical stimulation, Pain management, Self Care/advanced ADL retraining, Therapeutic Activities, UE/LE Coordination activities, Visual/perceptual remediation/compensation, Skin care/wound managment, Patient/family education, Functional mobility training, Disease mangement/prevention, Cognitive remediation/compensation, Therapeutic Exercise, Community reintegration, DME/adaptive equipment instruction, Neuromuscular re-education, Psychosocial support, Splinting/orthotics, Wheelchair propulsion/positioning, UE/LE Strength taining/ROM  ?SLP Interventions Cognitive remediation/compensation, Cueing hierarchy, Functional tasks, Patient/family education, Internal/external aids, Speech/Language facilitation, Therapeutic Exercise, Therapeutic Activities  ?TR Interventions    ?SW/CM Interventions Discharge Planning, Psychosocial Support, Patient/Family Education  ? ?Barriers to Discharge ?MD  Medical stability  ?Nursing Decreased caregiver support ?1 level ramped entry w spouse/son; spouse works, goes from  Retail buyer to recliner, uses [employer] during the day; few steps; has DME  ?PT Insurance for SNF coverage, Weight, Decreased caregiver support ?   ?OT Incontinence ?   ?SLP   ?   ?SW Insurance for SNF coverage, Medication compliance ?   ? ?Team Discharge Planning: ?Destination: PT-Home ,OT- Home , SLP-Home ?Projected Follow-up: PT-Home health PT, 24 hour supervision/assistance, OT-  Home health OT, SLP-Home Health  SLP ?Projected Equipment Needs: PT-To be determined, OT- To be determined, SLP-None recommended by SLP ?Equipment Details: PT-TBD - she owns a wheelchair and RW, OT-  ?Patient/family involved in discharge planning: PT- Patient,  OT-Patient, SLP-Patient ? 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 The team will be addressing functional mobility, strength, stamina, balance, safety, adaptive techniques and equipment, self-care, bowel and bladder mgt, patient and caregiver education. Goals have been set at Min/Mod A. Anticipated discharge destination is home.   ? ? ?See Team Conference Notes for weekly updates to the plan of care  ?

## 2021-05-31 NOTE — Plan of Care (Signed)
?  Problem: Consults ?Goal: RH STROKE PATIENT EDUCATION ?Description: See Patient Education module for education specifics  ?Outcome: Progressing ?  ?Problem: RH BOWEL ELIMINATION ?Goal: RH STG MANAGE BOWEL WITH ASSISTANCE ?Description: STG Manage Bowel with mod I Assistance. ?Outcome: Progressing ?  ?Problem: RH PAIN MANAGEMENT ?Goal: RH STG PAIN MANAGED AT OR BELOW PT'S PAIN GOAL ?Description: At or below level 4 with prns ?Outcome: Progressing ?  ?

## 2021-06-02 NOTE — Progress Notes (Signed)
Speech Language Pathology Daily Session Note ? ?Patient Details  ?Name: Gwendolyn Morales ?MRN: [id number] ?Date of Birth: 04/21/1965 ? ?Today's Date: 06/02/2021 ?SLP Individual Time: 1430-1530 ?SLP Individual Time Calculation (min): 60 min ? ?Short Term Goals: ?Week 1: SLP Short Term Goal 1 (Week 1): Pt will utilize speech intelligibility strategies as trained with overall Mod A cues for 50% intelligibility at phrase level ?SLP Short Term Goal 2 (Week 1): Pt will complete divided attention during functional tasks with min A cues ?SLP Short Term Goal 3 (Week 1): Pt will solve mildly complex problems with min A cues 80% accuracy ?SLP Short Term Goal 4 (Week 1): Pt will utilize strategies as trained to increase error awareness during cognitive and communication tasks with min A cues ? ?Skilled Therapeutic Interventions: ? Pt was seen for skilled ST targeting goals for communication.  Upon arrival, pt was seated upright in chair, awake, alert, and agreeable to participating in treatment.  Pt remains aphonic but was stimulable for briefly achieving voicing with structured practice of humming and vocal adduction exercises while bearing down on her chair.  Diaphragmatic breathing exercises were also addressed to target coordination of phonation with respiration with pt able to return demonstration in <50% of opportunities, mod assist multimodal cues.  Pt continues to endorse that writing in her notebook is her primary means of communicating with staff and family; however, she was noted with decreased legibility and she reported that sometimes people have trouble reading her handwriting.  SLP discussed techniques to improve legibility and efficiency when communicating, including shortening her responses to simple questions (ie "coke please," instead of "I would like to have a coke please") with large print and spacing out words.  SLP provided pt with a whiteboard to be used when pt needed to provide quick, simple responses to  questions and encouraged pt to use her notebook for longer conversations when her communication partner could sit next to her and read while she was writing.   All questions were answered to pt's satisfaction at this time.  Continue per current plan of care.   ? ?Pain ?Pain Assessment ?Pain Scale: 0-10 ?Pain Score: 0-No pain ? ?Therapy/Group: Individual Therapy ? ?Kenyada Hy, Melanee Spry ?06/02/2021, 4:11 PM ?

## 2021-06-02 NOTE — Progress Notes (Signed)
Occupational Therapy Session Note ? ?Patient Details  ?Name: Gwendolyn Morales ?MRN: [id number] ?Date of Birth: 1965/10/30 ? ?Today's Date: 06/02/2021 ?OT Individual Time: 1045-1200 ?OT Individual Time Calculation (min): 75 min  ? ? ?Short Term Goals: ?Week 1:  OT Short Term Goal 1 (Week 1): Pt will complete sit<>stand with max assist in prep for ADLs ?OT Short Term Goal 2 (Week 1): Pt will complete LB dressing with mod assist ?OT Short Term Goal 3 (Week 1): Pt will complete UB dressing with mod assist. ?OT Short Term Goal 4 (Week 1): Pt will complete squat pivot toilet transfer with mod assist. ?Week 2:    ? ?Skilled Therapeutic Interventions/Progress Updates:  ?  1:1 Pt received dressed and in the w/c. Self care retraining at shower level. Focused on doffing clothing with functional use of left hand as gross assist. Pt with a lot of tone in left UE and difficulty with not "turning on" all over her mm at one time during tasks. Cross LEs to doff socks. Pt stood usign SARA Plus to doff brief (soiled) and transition into roll in shower chair. Pt able to maintain upright posture and weight shift forward but did relay on the knee support on the left. In the shower continued focus on functional use of left Ue in all tasks and break away from synergistic  patterns. Pt dressed in dress, shirt and brief with mod Afor UB and total A for brief and socks. Participated in grooming at the sink with min A. Left sitting up in w/c at end of session  ? 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 ?Pain: ?No reports of pain in session  ? ?Therapy/Group: Individual Therapy ? ?Nicoletta Ba ?06/02/2021, 12:27 PM ?

## 2021-06-02 NOTE — Progress Notes (Signed)
?                                                       PROGRESS NOTE ? ? ?Subjective/Complaints: ? ?Pt reports no complaints- doing well this AM ?In stedy in gym with therapy- no concerns.  ? ?ROS:  ?Pt denies SOB, abd pain, CP, N/V/C/D, and vision changes ? ? ?Objective: ?  ?No results found. ?No results for input(s): WBC, HGB, HCT, PLT in the last 72 hours. ? ?Recent Labs  ?  05/30/21 ?1438  ?NA 141  ?K 3.6  ?CL 109  ?CO2 25  ?GLUCOSE 114*  ?BUN 20  ?CREATININE 1.21*  ?CALCIUM 10.8*  ? ? ?Intake/Output Summary (Last 24 hours) at 06/02/2021 1415 ?Last data filed at 06/02/2021 0102 ?Gross per 24 hour  ?Intake 300 ml  ?Output --  ?Net 300 ml  ?  ? ?  ? ?Physical Exam: ?Vital Signs ?Blood pressure 130/82, pulse 63, temperature 98.1 ?F (36.7 ?C), resp. rate 16, height 5\' 10"  (1.778 m), weight 106.9 kg, SpO2 95 %. ? ? ? ?General: awake, alert, appropriate, NAD ?HENT: conjugate gaze; oropharynx moist ?CV: regular rate; no JVD ?Pulmonary: CTA B/L; no W/R/R- good air movement ?GI: soft, NT, ND, (+)BS ?Psychiatric: appropriate- flat affect ?Neurological: alert ?Neurologic: Cranial nerves II through XII intact, motor strength is 4/5 in right and 3 - Left deltoid, bicep, tricep, grip, hip flexor, knee extensors, ankle dorsiflexor and plantar flexor ?Sensory exam normal sensation to light touch and proprioception in bilateral upper and lower extremities ?Hypophonic dysarthria ?Musculoskeletal: Full range of motion in all 4 extremities. No joint swelling  ?Right knee TTP ? Mental Status: She is alert.  ?   Sensory: Sensory deficit present.  ?   Motor: Weakness (mild left sided weakness after therapy in 2017--worse now) present.  ?   Comments: Left facial paresis with severe dysarthria with low voice volume--tends to keep mouth open. 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  ?Beasy board transfer total Ax2 ? ? ?Assessment/Plan: ?1. Functional deficits which require 3+ hours per day of interdisciplinary therapy in a  comprehensive inpatient rehab setting. ?Physiatrist is providing close team supervision and 24 hour management of active medical problems listed below. ?Physiatrist and rehab team continue to assess barriers to discharge/monitor patient progress toward functional and medical goals ? ?Care Tool: ? ?Bathing ?   ?   ? Body parts bathed by helper: Buttocks, Front perineal area ?  ?  ?Bathing assist Assist Level: Dependent - Patient 0% ?  ?  ?Upper Body Dressing/Undressing ?Upper body dressing   ?What is the patient wearing?: Hospital gown only ?   ?Upper body assist Assist Level: Dependent - Patient 0% ?   ?Lower Body Dressing/Undressing ?Lower body dressing ? ? ?   ?What is the patient wearing?: Hospital gown only ? ?  ? ?Lower body assist Assist for lower body dressing: Dependent - Patient 0% ?   ? ?Toileting ?Toileting    ?Toileting assist Assist for toileting: Dependent - Patient 0% ?  ?  ?Transfers ?Chair/bed transfer ? ?Transfers assist ?   ? ?Chair/bed transfer assist level: Dependent - Patient 0% ) ?  ?  ?Locomotion ?Ambulation ? ? ?Ambulation assist ? ? Ambulation activity did not occur: Safety/medical concerns ? ?  ?  ?   ? ?  Walk 10 feet activity ? ? ?Assist ? Walk 10 feet activity did not occur: Safety/medical concerns ? ?  ?   ? ?Walk 50 feet activity ? ? ?Assist Walk 50 feet with 2 turns activity did not occur: Safety/medical concerns ? ?  ?   ? ? ?Walk 150 feet activity ? ? ?Assist Walk 150 feet activity did not occur: Safety/medical concerns ? ?  ?  ?  ? ?Walk 10 feet on uneven surface  ?activity ? ? ?Assist Walk 10 feet on uneven surfaces activity did not occur: Safety/medical concerns ? ? ?  ?   ? ?Wheelchair ? ? ? ? ?Assist Is the patient using a wheelchair?: Yes ?Type of Wheelchair: Manual ?  ? ?Wheelchair assist level: Dependent - Patient 0% ?Max wheelchair distance: 164ft  ? ? ?Wheelchair 50 feet with 2 turns activity ? ? ? ?Assist ? ?  ?  ? ? ?Assist Level: Dependent - Patient 0%   ? ?Wheelchair 150 feet activity  ? ? ? ?Assist ?   ? ? ?Assist Level: Dependent - Patient 0%  ? ?Blood pressure 130/82, pulse 63, temperature 98.1 ?F (36.7 ?C), resp. rate 16, height 5\' 10"  (1.778 m), weight 106.9 kg, SpO2 95 %. ? ?Medical Problem List and Plan: ?1. 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 ?            -patient may shower ?            -ELOS/Goals: 14-20d Min/Mod A ? HFU scheduled ? Continue CIR- PT, OT and SLP ? Using stedy in gym today- required 2 person assist ?2.  Antithrombotics: ?-DVT/anticoagulation:  Pharmaceutical: Lovenox ?            -antiplatelet therapy: ASA/Plavix X 3 weeks followed by ASA alone.  ?3. Shoulder pain: tylenol prn. voltaren gel ordered.  ?4. Mood: LCSW to follow for evaluation and support.  ?            -antipsychotic agents: N/A ?5. Neuropsych: This patient is capable of making decisions on her own behalf. ?6. Skin/Wound Care: Routine pressure relief measures.  ?7. Fluids/Electrolytes/Nutrition: Monitor I/O. ?8. HTN: Monitor BP TID. Continue Norvasc 10 mg/day, hydralazine 25 mg every 8 hours, and metoprolol 100 mg BID. Supplement klor which will also help with HTN. Add magnesium gluconate 250mg  HS. D/c hctz given good control and bump in creatinine ? 4/23- BP controlled- con't regimen ? 10. CKD III: Monitor with routine checks. Recommended drinking 6-8 glasses of water per day. D/c HCTX given worsening renal function. ? 4/23- check labs in AM ?11. E coli UTI: Ceftriaxone started on 04/12 with resolution of leucocytosis.  ?12. Elevated LDL: On high dose Lipitor  ?13. Chronic intermittent cough: Continue Mucinex BID ?14. Constipation: Will start miralax bid. Add magnesium gluconate 250mg  HS ?15. Undiagnosed OSA: To be started in sleep study per husband. ?16. Obesity: BMI 33.82:  provide dietary education ?17. Hypokalemia: supplement klor. Repeat BMP today ?18. Right knee pain: XR shows severe patellofemoral arthritis. Lidocaine patch ordered. Apply voltaren gel, improved, continue this regimen ?19. Aphonic: continue SLP. Outpatient ENT follow-up. Discussed with patient. Respiratory muscle training ?20. ?OSA: will need to follow-up with outpatient pulmonologist ? ?LOS: ?6 days ?A FACE TO FACE EVALUATION WAS PERFORMED ? ?Lennie Vasco ?06/02/2021, 2:15 PM  ? ?  ?

## 2021-06-02 NOTE — Progress Notes (Signed)
Physical Therapy Session Note ? ?Patient Details  ?Name: Gwendolyn Morales ?MRN: [id number] ?Date of Birth: Aug 20, 1965 ? ?Today's Date: 06/02/2021 ?PT Individual Time: 1423-9532 ?PT Individual Time Calculation (min): 55 min  ? ?Short Term Goals: ?Week 1:  PT Short Term Goal 1 (Week 1): Pt will complete bed mobility with modA ?PT Short Term Goal 2 (Week 1): Pt will complete bed<>chair transfer with maxA and LRAD ?PT Short Term Goal 3 (Week 1): Pt will initiate gait training ?PT Short Term Goal 4 (Week 1): Pt will tolerate sitting OOB in chair for >2 hours outside of therapy ? ?Skilled Therapeutic Interventions/Progress Updates:  ?   ?Pt presents supine in bed and agreeable to PT tx - denies pain although she has lidocaine patch on her R knee. Pt fully dressed. Pt reports she didn't get OOB yesterday - encouraged her on days off of therapy to continue to ask staff to mobilize her OOBTC for all meals. Completed supine<>sitting EOB with maxA for trunk and BLE management. Requires modA for initial sitting balance due to posterior lean and difficulty self correcting. Used Clarise Cruz Plus to transfer with +2 assist for safety from EOB to w/c with instruction to activate BLE during Clarise Cruz Plus transition. Transported in w/c to main rehab gym and assisted to mat table via Clarise Cruz Plus. At edge of mat, worked on sitting balance, trunk control, and postural awareness. Using large wedge for posterior support, completed 3x5 modified crunches, 2x5 lateral leans L, 2x5 lateral leans R - minA needed for support during these activities - improved trunk activation and ability to self initiate movements during the 2nd sets of each ex. Seated there-ex to target LLE strengthening - 2x15 LAQ (2/5 strength) and 2x15 isometric hamstring curls. LLE hamstring and heel cord stretching while seated. Clarise Cruz plus transfer back to her w/c and then returned to her room. Remained seated in w/c with safety belt alarm on, call bell in lap, all needs met.  ? 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  ? ?Therapy/Group: Individual Therapy ? ?Gwendolyn Morales Gwendolyn Morales ?06/02/2021, 7:27 AM  ?

## 2021-06-03 LAB — BASIC METABOLIC PANEL
Anion gap: 8 (ref 5–15)
Anion gap: 8 (ref 5–15)
BUN: 18 mg/dL (ref 6–20)
BUN: 19 mg/dL (ref 6–20)
CO2: 26 mmol/L (ref 22–32)
CO2: 27 mmol/L (ref 22–32)
Calcium: 10.5 mg/dL — ABNORMAL HIGH (ref 8.9–10.3)
Calcium: 10.6 mg/dL — ABNORMAL HIGH (ref 8.9–10.3)
Chloride: 108 mmol/L (ref 98–111)
Chloride: 109 mmol/L (ref 98–111)
Creatinine, Ser: 0.95 mg/dL (ref 0.44–1.00)
Creatinine, Ser: 1.06 mg/dL — ABNORMAL HIGH (ref 0.44–1.00)
GFR, Estimated: >60 mL/min (ref >60)
GFR, Estimated: >60 mL/min (ref >60)
Glucose, Bld: 101 mg/dL — ABNORMAL HIGH (ref 70–99)
Glucose, Bld: 94 mg/dL (ref 70–99)
Potassium: 3.7 mmol/L (ref 3.5–5.1)
Potassium: 4 mmol/L (ref 3.5–5.1)
Sodium: 142 mmol/L (ref 135–145)
Sodium: 144 mmol/L (ref 135–145)

## 2021-06-03 LAB — CBC
HCT: 48.4 % — ABNORMAL HIGH (ref 36.0–46.0)
Hemoglobin: 15.4 g/dL — ABNORMAL HIGH (ref 12.0–15.0)
MCH: 26.4 pg (ref 26.0–34.0)
MCHC: 31.8 g/dL (ref 30.0–36.0)
MCV: 83 fL (ref 80.0–100.0)
Platelets: 326 10*3/uL (ref 150–400)
RBC: 5.83 MIL/uL — ABNORMAL HIGH (ref 3.87–5.11)
RDW: 15.6 % — ABNORMAL HIGH (ref 11.5–15.5)
WBC: 7.5 10*3/uL (ref 4.0–10.5)
nRBC: 0 % (ref 0.0–0.2)

## 2021-06-03 MED ORDER — MAGNESIUM GLUCONATE 500 MG PO TABS
500.0000 mg | ORAL_TABLET | Freq: Every day | ORAL | Status: DC
Start: 2021-06-03 — End: 2021-06-19
  Administered 2021-06-03 – 2021-06-18 (×16): 500 mg via ORAL
  Filled 2021-06-03 (×16): qty 1

## 2021-06-03 MED ORDER — METOPROLOL TARTRATE 50 MG PO TABS
75.0000 mg | ORAL_TABLET | Freq: Two times a day (BID) | ORAL | Status: DC
  Administered 2021-06-03 – 2021-06-18 (×31): 75 mg via ORAL
  Filled 2021-06-03 (×31): qty 1

## 2021-06-03 MED ORDER — GUAIFENESIN ER 600 MG PO TB12
600.0000 mg | ORAL_TABLET | Freq: Two times a day (BID) | ORAL | Status: DC
Start: 2021-06-03 — End: 2021-06-19
  Administered 2021-06-03 – 2021-06-18 (×31): 600 mg via ORAL
  Filled 2021-06-03 (×31): qty 1

## 2021-06-03 NOTE — Progress Notes (Signed)
Speech Language Pathology Daily Session Note ? ?Patient Details  ?Name: Gwendolyn Morales ?MRN: [id number] ?Date of Birth: 24-Jul-1965 ? ?Today's Date: 06/03/2021 ?SLP Individual Time: 4132-4401 ?SLP Individual Time Calculation (min): 60 min ? ?Short Term Goals: ?Week 1: SLP Short Term Goal 1 (Week 1): Pt will utilize speech intelligibility strategies as trained with overall Mod A cues for 50% intelligibility at phrase level ?SLP Short Term Goal 2 (Week 1): Pt will complete divided attention during functional tasks with min A cues ?SLP Short Term Goal 3 (Week 1): Pt will solve mildly complex problems with min A cues 80% accuracy ?SLP Short Term Goal 4 (Week 1): Pt will utilize strategies as trained to increase error awareness during cognitive and communication tasks with min A cues ? ?Skilled Therapeutic Interventions: ?Pt seen this date for skilled ST intervention targeting speech intelligibility goals outlined above. Pt encountered awake/alert and sitting upright in bed; finishing med pass with LPN. Agreeable to ST intervention.  ?  ?SLP facilitated today's session by incorporating the following skilled interventions during therapeutic and functional tx tasks: multimodal cueing to include, verbal, visual, and model prompting, biofeedback via use of decibel meter, diaphragmatic breathing, + bearing down, reinforcement of compensatory speech intelligibility strategies, corrective feedback, mass practice, self-monitoring, and implementation of augmentative communication systems.  ? ?Pt practiced diaphragmatic breathing techniques, in supine, given Max faded to Mod- ?Max multimodal A; continues to demonstrate need for reinforcement. Pt recalled when to utilize whiteboard vs notebook to communicate wants/needs, from session on 4/23, and utilized during communication breakdown given Min-Mod verbal A. Pt verbally expressed single, functional words between 32-35 dB, on decibel meter, given Mod verbal and visual A for use of  diaphragmatic breathing, bearing down to increase subglottic pressure, and increased vocal intensity. Pt demonstrated disappointment and frustration during this task, writing that "she did not realize this is how low she sounded." States the decibel meter was helpful in self-monitoring and building emergent awareness of deficits. Completed generative naming task with Mod I.  ? ?Session concluded with pt in bed, bed alarm on, call bell reviewed and within reach, and all immediate needs met. Continue per current ST POC. ? ?Pain ?No pain reported this session; NAD. ? ?Therapy/Group: Individual Therapy ? ?Jesiah Grismer A Lujuana Kapler ?06/03/2021, 9:19 AM ?

## 2021-06-03 NOTE — Progress Notes (Signed)
Occupational Therapy Session Note ? ?Patient Details  ?Name: Gwendolyn Morales ?MRN: [id number] ?Date of Birth: 1965/12/12 ? ?Today's Date: 06/03/2021 ?OT Individual Time: [id number] ?OT Individual Time Calculation (min): 60 min  ? ? ?Short Term Goals: ?Week 1:  OT Short Term Goal 1 (Week 1): Pt will complete sit<>stand with max assist in prep for ADLs ?OT Short Term Goal 2 (Week 1): Pt will complete LB dressing with mod assist ?OT Short Term Goal 3 (Week 1): Pt will complete UB dressing with mod assist. ?OT Short Term Goal 4 (Week 1): Pt will complete squat pivot toilet transfer with mod assist. ? ?Skilled Therapeutic Interventions/Progress Updates:  ?  Subjective: Nurse requesting assist with pt back to bed transfer due to pt reporting incontinence of bladder.   ?  Objective:  Pt sitting up in w/c.  Completed beezy board w/c to EOB with total assist and second person providing min assist.  Pt completed sit to supine with max assist.  Min assist to roll left and mod assist to roll right during dependent perciare and clothing management. Therapist left pt with nurse.  Upon returning at scheduled therapy time, pt semi reclined in bed.Neuro re-ed of LUE completed with pt in supine gravity elminated position. Provided orange cone as target for external focus to achieve shoulder abduction to about 60 degrees.  Pt then completed shoulder ER with cone placed as target. Pt completed supine to sit with step by step Vcs and min assist with HOB elevated per pt request and using bed rails.  [facility] board transfer completed with total assist +1 and second person only needed for contact guard to ensure safety.  Transported pt to ortho gym and completed anterior leans sitting in w/c and facing EOM while rolling BUE on cylindrical bolster.  Pt initially hesitant however with repetition noted pt increasing anterior lean. Returned back to room.  Call bell in reach, seat alarm on.   ?  Assessment:  Pt making progress evidenced by improved  independence during beezy board transfers and increased flexion at hips noted with anterior trunk leans.  Primary barriers today included urinary and bowel incontinence and flexor tone of left shoulder limited performance during neuro re-ed.  ?  Plan: Pt would benefit from further training on adding lateral scoot to anterior trunk leans in preparation for standard sliding board transfers.  Pt would also benefit from further neuro re-ed on LUE.  ? ? 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 ? ? ?Therapy/Group: Individual Therapy ? ?Caryl Asp Fillmore Bynum ?06/03/2021, 4:34 PM ?

## 2021-06-03 NOTE — Progress Notes (Signed)
?                                                       PROGRESS NOTE ? ? ?Subjective/Complaints: ?No new complaints ?Pain is stable ?Working with SLP ?Practiced humming given aphonia  ? ?ROS:  ?Pt denies SOB, abd pain, CP, N/V/C/D, and vision changes, +aphonic ? ? ?Objective: ?  ?No results found. ?No results for input(s): WBC, HGB, HCT, PLT in the last 72 hours. ? ?No results for input(s): NA, K, CL, CO2, GLUCOSE, BUN, CREATININE, CALCIUM in the last 72 hours. ? ? ?Intake/Output Summary (Last 24 hours) at 06/03/2021 1132 ?Last data filed at 06/03/2021 0755 ?Gross per 24 hour  ?Intake 840 ml  ?Output --  ?Net 840 ml  ?  ? ?  ? ?Physical Exam: ?Vital Signs ?Blood pressure 124/65, pulse (!) 56, temperature 97.6 ?F (36.4 ?C), temperature source Oral, resp. rate 16, height 5\' 10"  (1.778 m), weight 106.9 kg, SpO2 96 %. ? ? ? ?General: awake, alert, appropriate, NAD ?HENT: conjugate gaze; oropharynx moist ?CV: bradycardia ?Pulmonary: CTA B/L; no W/R/R- good air movement ?GI: soft, NT, ND, (+)BS ?Psychiatric: appropriate- flat affect ?Neurological: alert ?Neurologic: Cranial nerves II through XII intact, motor strength is 4/5 in right and 3 - Left deltoid, bicep, tricep, grip, hip flexor, knee extensors, ankle dorsiflexor and plantar flexor ?Sensory exam normal sensation to light touch and proprioception in bilateral upper and lower extremities ?Hypophonic dysarthria ?Musculoskeletal: Full range of motion in all 4 extremities. No joint swelling  ?Right knee TTP ? Mental Status: She is alert.  ?   Sensory: Sensory deficit present.  ?   Motor: Weakness (mild left sided weakness after therapy in 2017--worse now) present.  ?   Comments: Left facial paresis with severe dysarthria with low voice volume--tends to keep mouth open. 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  ?Beasy board transfer total Ax2 ? ? ?Assessment/Plan: ?1. Functional deficits which require 3+ hours per day of interdisciplinary therapy in a  comprehensive inpatient rehab setting. ?Physiatrist is providing close team supervision and 24 hour management of active medical problems listed below. ?Physiatrist and rehab team continue to assess barriers to discharge/monitor patient progress toward functional and medical goals ? ?Care Tool: ? ?Bathing ?   ?   ? Body parts bathed by helper: Buttocks, Front perineal area ?  ?  ?Bathing assist Assist Level: Dependent - Patient 0% ?  ?  ?Upper Body Dressing/Undressing ?Upper body dressing   ?What is the patient wearing?: Hospital gown only ?   ?Upper body assist Assist Level: Dependent - Patient 0% ?   ?Lower Body Dressing/Undressing ?Lower body dressing ? ? ?   ?What is the patient wearing?: Hospital gown only ? ?  ? ?Lower body assist Assist for lower body dressing: Dependent - Patient 0% ?   ? ?Toileting ?Toileting    ?Toileting assist Assist for toileting: Dependent - Patient 0% ?  ?  ?Transfers ?Chair/bed transfer ? ?Transfers assist ?   ? ?Chair/bed transfer assist level: Dependent - Patient 0% ) ?  ?  ?Locomotion ?Ambulation ? ? ?Ambulation assist ? ? Ambulation activity did not occur: Safety/medical concerns ? ?  ?  ?   ? ?Walk 10 feet activity ? ? ?Assist ? Walk 10 feet activity did not occur: Safety/medical concerns ? ?  ?   ? ?  Walk 50 feet activity ? ? ?Assist Walk 50 feet with 2 turns activity did not occur: Safety/medical concerns ? ?  ?   ? ? ?Walk 150 feet activity ? ? ?Assist Walk 150 feet activity did not occur: Safety/medical concerns ? ?  ?  ?  ? ?Walk 10 feet on uneven surface  ?activity ? ? ?Assist Walk 10 feet on uneven surfaces activity did not occur: Safety/medical concerns ? ? ?  ?   ? ?Wheelchair ? ? ? ? ?Assist Is the patient using a wheelchair?: Yes ?Type of Wheelchair: Manual ?  ? ?Wheelchair assist level: Dependent - Patient 0% ?Max wheelchair distance: 131ft  ? ? ?Wheelchair 50 feet with 2 turns activity ? ? ? ?Assist ? ?  ?  ? ? ?Assist Level: Dependent - Patient 0%   ? ?Wheelchair 150 feet activity  ? ? ? ?Assist ?   ? ? ?Assist Level: Dependent - Patient 0%  ? ?Blood pressure 124/65, pulse (!) 56, temperature 97.6 ?F (36.4 ?C), temperature source Oral, resp. rate 16, height 5\' 10"  (1.778 m), weight 106.9 kg, SpO2 96 %. ? ?Medical Problem List and Plan: ?1. 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 ?            -patient may shower ?            -ELOS/Goals: 14-20d Min/Mod A ? HFU scheduled ? Continue CIR- PT, OT and SLP ? Using stedy in gym today- required 2 person assist ?2.  Antithrombotics: ?-DVT/anticoagulation:  Pharmaceutical: Lovenox ?            -antiplatelet therapy: ASA/Plavix X 3 weeks followed by ASA alone.  ?3. Shoulder pain: tylenol prn. voltaren gel ordered.  ?4. Mood: LCSW to follow for evaluation and support.  ?            -antipsychotic agents: N/A ?5. Neuropsych: This patient is capable of making decisions on her own behalf. ?6. Skin/Wound Care: Routine pressure relief measures.  ?7. Fluids/Electrolytes/Nutrition: Monitor I/O. ?8. HTN: Monitor BP TID. Continue Norvasc 10 mg/day, hydralazine 25 mg every 8 hours, and metoprolol 100 mg BID. Supplement klor which will also help with HTN. Add magnesium gluconate 250mg  HS. D/c hctz given good control and bump in creatinine ? 4/23- BP controlled- con't regimen ? 10. CKD III: Monitor with routine checks. Recommended drinking 6-8 glasses of water per day. D/c HCTZ given worsening renal function. ?11. E coli UTI: Ceftriaxone started on 04/12 with resolution of leucocytosis.  ?12. Elevated LDL: On high dose Lipitor  ?13. Chronic intermittent cough: Continue Mucinex BID ?14. Constipation: Will start miralax bid. Increase magnesium gluconate to 500 mg HS ?15. Undiagnosed OSA: To be started in sleep study per husband. ?16. Obesity:  BMI 33.82: provide dietary education ?17. Hypokalemia: supplement klor. Repeat BMP today ?18. Right knee pain: XR shows severe patellofemoral arthritis. Lidocaine patch ordered. Apply voltaren gel, improved, continue this regimen ?19. Aphonic: continue SLP. Outpatient ENT follow-up. Discussed with patient. Respiratory muscle training ?20. ?OSA: will need to follow-up with outpatient pulmonologist ?21. Bradycardia: decrease Lopressor to 75mg  BID ? ?LOS: ?7 days ?A FACE TO FACE EVALUATION WAS PERFORMED ? ?5/23 P Takiera Mayo ?06/03/2021, 11:33 AM  ? ?  ?

## 2021-06-03 NOTE — Progress Notes (Signed)
Physical Therapy Session Note ? ?Patient Details  ?Name: Gwendolyn Morales ?MRN: [id number] ?Date of Birth: 1965/03/20 ? ?Today's Date: 06/03/2021 ?PT Individual Time: 8850-2774 ?PT Individual Time Calculation (min): 72 min  ? ?Short Term Goals: ?Week 1:  PT Short Term Goal 1 (Week 1): Pt will complete bed mobility with modA ?PT Short Term Goal 2 (Week 1): Pt will complete bed<>chair transfer with maxA and LRAD ?PT Short Term Goal 3 (Week 1): Pt will initiate gait training ?PT Short Term Goal 4 (Week 1): Pt will tolerate sitting OOB in chair for >2 hours outside of therapy ? ?Skilled Therapeutic Interventions/Progress Updates:  ?   ?Pt presenting supine in bed - dressed and agreeable to PT tx. She reports 3/10 R knee pain - repositioned and mobility provided for pain management. Pt continues to be hypophonic - difficult to hear and understand - SLP provided her a white board to assist with communication needs.  ? ? With [facility] fully elevated, she required maxA for transitioning to EOB for trunk and BLE management. She also required maxA for forward scooting to EOB, needed linen to assist with hips. Sit's EOB with SBA while unsupported. Used [employer] Dec Plus to transfer from EOB to w/c and then transported to day room rehab gym and setup at wheelchair level using the K[place]ron  at resistance 75 cm/sec. Pt able to iniate both LE for hip extension, limited more on L>R.  ? ?Completed Beezy board transfer from w/c to mat table, towards her stronger R side, requiring +2 totalA. Pt with limited ability to adequately overcome soft tissue and weakness during transfer. Able to sit unsupported at edge of mat with supervision. We worked on seated there-ex for UE and LE strengthening in LAQ, hamstring curls, seated marches over hurdle (AAROM), arm punches on L to beach ball with 3lb dumbbell to challenge strength/coordination/endurance. Beezy board transfer back to her w/c but used 6inch platform under LE to assist with positioning for her to  push more through her LE 's during transfer (limited improvement and continued to require +2 totalA).  ? ?Pt transported back to her room in w/c and she remained seated in w/c with safety belt alarm on, call bell in lap, all personal items in reach.  ? 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  ? ? ?Therapy/Group: Individual Therapy ? ?Maribella Kuna P Darcy Barbara ?06/03/2021, 7:25 AM  ?

## 2021-06-04 NOTE — Plan of Care (Signed)
?  Problem: RH Balance ?Goal: LTG Patient will maintain dynamic standing balance (PT) ?Description: LTG:  Patient will maintain dynamic standing balance with assistance during mobility activities (PT) ?Flowsheets (Taken 06/04/2021 0751) ?LTG: Pt will maintain dynamic standing balance during mobility activities with:: Maximal Assistance - Patient 25 - 49% ?  ?Problem: Sit to Stand ?Goal: LTG:  Patient will perform sit to stand with assistance level (PT) ?Description: LTG:  Patient will perform sit to stand with assistance level (PT) ?Flowsheets (Taken 06/04/2021 0751) ?LTG: PT will perform sit to stand in preparation for functional mobility with assistance level: ? Maximal Assistance - Patient 25 - 49% ? Moderate Assistance - Patient 50 - 74% ?  ?Problem: RH Bed Mobility ?Goal: LTG Patient will perform bed mobility with assist (PT) ?Description: LTG: Patient will perform bed mobility with assistance, with/without cues (PT). ?Flowsheets (Taken 06/04/2021 0751) ?LTG: Pt will perform bed mobility with assistance level of: Moderate Assistance - Patient 50 - 74% ?  ?Problem: RH Bed to Chair Transfers ?Goal: LTG Patient will perform bed/chair transfers w/assist (PT) ?Description: LTG: Patient will perform bed to chair transfers with assistance (PT). ?Flowsheets (Taken 06/04/2021 0751) ?LTG: Pt will perform Bed to Chair Transfers with assistance level: Moderate Assistance - Patient 50 - 74% ?  ?Problem: RH Car Transfers ?Goal: LTG Patient will perform car transfers with assist (PT) ?Description: LTG: Patient will perform car transfers with assistance (PT). ?Flowsheets (Taken 06/04/2021 0751) ?LTG: Pt will perform car transfers with assist:: Moderate Assistance - Patient 50 - 74% ?  ?Problem: RH Ambulation ?Goal: LTG Patient will ambulate in controlled environment (PT) ?Description: LTG: Patient will ambulate in a controlled environment, # of feet with assistance (PT). ?Flowsheets (Taken 06/04/2021 0751) ?LTG: Pt will ambulate in  controlled environ  assist needed:: Moderate Assistance - Patient 50 - 74% ?LTG: Ambulation distance in controlled environment: 62ft ?Goal: LTG Patient will ambulate in home environment (PT) ?Description: LTG: Patient will ambulate in home environment, # of feet with assistance (PT). ?Flowsheets (Taken 06/04/2021 0751) ?LTG: Pt will ambulate in home environ  assist needed:: Moderate Assistance - Patient 50 - 74% ?LTG: Ambulation distance in home environment: 84ft ?  ?

## 2021-06-04 NOTE — Progress Notes (Addendum)
Speech Language Pathology Weekly Progress and Session Note ? ?Patient Details  ?Name: Gwendolyn Morales ?MRN: [id number] ?Date of Birth: 1965/12/25 ? ?Beginning of progress report period: May 28, 2021 ?End of progress report period: June 04, 2021 ? ?Today's Date: 06/04/2021 ?SLP Individual Time: 8828-0034 ?SLP Individual Time Calculation (min): 45 min ? ?Short Term Goals: ?Week 1: SLP Short Term Goal 1 (Week 1): Pt will utilize speech intelligibility strategies as trained with overall Mod A cues for 50% intelligibility at phrase level ?SLP Short Term Goal 1 - Progress (Week 1): Not met ?SLP Short Term Goal 2 (Week 1): Pt will complete divided attention during functional tasks with min A cues ?SLP Short Term Goal 2 - Progress (Week 1): Not met ?SLP Short Term Goal 3 (Week 1): Pt will solve mildly complex problems with min A cues 80% accuracy ?SLP Short Term Goal 3 - Progress (Week 1): Not met ?SLP Short Term Goal 4 (Week 1): Pt will utilize strategies as trained to increase error awareness during cognitive and communication tasks with min A cues ?SLP Short Term Goal 4 - Progress (Week 1): Not met ? ?  ?New Short Term Goals: ?Week 2: SLP Short Term Goal 1 (Week 2): Pt will complete functional problem-solving tasks r/t daily tasks with 80% accuracy given Mod A ?SLP Short Term Goal 2 (Week 2): Pt will complete therapeutic and functional visual and auditory alternating attention tasks with 80% accuracy given Mod A ?SLP Short Term Goal 3 (Week 2): Pt will utilize diaphragmatic breathing and speech intelligiblity strategies to achieve 50% intelligibility at the 1-2 word(s) level with Min-Mod A. ?SLP Short Term Goal 4 (Week 2): Pt will utilize strategy of double checking to increase error awareness during cognitive and communication tasks with Mod A. ? ?Weekly Progress Updates: Pt has met 0 out of 3 long-term goals and 0 out of 4 short-term goals this past week. Barriers to progress include the severity of pt's deficits,  fatigue, and decreased error awareness + insight into significance of current deficits. Pt education is ongoing re: compensatory error awareness and speech intelligibility strategies. Family has not been present for family education/training. Pt continues to present with at least mild-moderate cognitive deficits and severe dysarthria in the setting of aphonia, decreased breath support, poor resonance, and imprecise articulation. Currently, pt benefits from Min-Mod A to utilize augmentative communication systems when communication breakdown occurs, and Mod to Max A for completion of functional iADL tasks (medication and money management). Will likely downgrade current goals to reflect progress thus far. Continue per current ST POC. ? ?Intensity: Minumum of 1-2 x/day, 30 to 90 minutes ?Frequency: 3 to 5 out of 7 days ?Duration/Length of Stay: ~2 weeks ?Treatment/Interventions: Cognitive remediation/compensation;Cueing hierarchy;Functional tasks;Patient/family education;Internal/external aids;Speech/Language facilitation;Therapeutic Exercise;Therapeutic Activities ? ? ?Daily Session ?Skilled Therapeutic Interventions:  ?Pt seen this date for skilled ST intervention targeting cognitive-linguistic goals outlined above. Pt encountered awake, though appeared fatigued following OT session; OOB in w/c. Agreeable to ST intervention. Of note, pt verbalizing fear of falling following OT session. States she has had this fear since childhood. ?  ?SLP facilitated today's session by incorporating the following skilled interventions during therapeutic and functional tx tasks: multimodal cueing to include, corrective feedback, mass practice, use of double checking to improve error awareness, and reinforcement of diaphragmatic breathing.  ? ?With the verbalization of fatigue, SLP provided two different tasks (one more cognitively challenging than the other), with pt selecting more complex task. Pt completed semi-complex money  management task (e.g. balancing  check register) with 100% accuracy given overall Max verbal and visual A for alternating visual attention, error awareness/self-correcting, and problem-solving; <25% accuracy with Mod I. Organized debit and credit values correctly on 100% of opportunities with Mod I. Corrected errors given Max verbal and visual A. Fatigue may have factored into pt's need for additional cues this date. Endorses that she did not realize how fatigued she really was until she completed the task. Discussed importance of energy conservation and fatigue management when completing iADL tasks that are more cognitively demanding. She verbalized understanding.  ? ?Additionally, pt participated in bubble blowing task to reinforce diaphragmatic breathing. Pt successfully blew bubbles on ~50% of trials on the 2-3 attempts given Mod-Max multimodal A to include verbal cues and demonstration. Utilized writing during communication breakdown given Set-Up A. Overall, pt was ~40-50% intelligible at the single word and short phrase level, within a known context, with Mod I. ? ?Session concluded with pt in w/c, safety belt donned, call bell reviewed and within reach, and all immediate needs met. Messaged CSW re: recommendation for neuropsychological consult given ongoing fear, which is limiting pt's participation in therapy, as well as functional independence. Continue per current ST POC.   ?Pain ?No pain reported this session; NAD ? ?Therapy/Group: Individual Therapy ? ?Kristel Durkee A Tyberius Ryner ?06/04/2021, 1:05 PM ? ? ? ? ? ? ?

## 2021-06-04 NOTE — Progress Notes (Signed)
Physical Therapy Weekly Progress Note ? ?Patient Details  ?Name: Gwendolyn Morales ?MRN: [id number] ?Date of Birth: September 06, 1965 ? ?Beginning of progress report period: May 28, 2021 ?End of progress report period: June 04, 2021 ? ?Today's Date: 06/04/2021 ?PT Individual Time: 5053-9767 ?PT Individual Time Calculation (min): 73 min  ? ?Patient has met 1 of 4 short term goals. Functional progress has been slow this reporting period. Overall, she requires maxA for bed mobility (with hospital bed features), CGA for sitting balance, and +2 totalA for SB transfers. She has also been using the [place] for transfers and has ambulated ~65f using the [employer] +3 assist. She has been unable to stand with +2 assist and relies on mechanical lift to achieve this for safety concerns. Anticipate that pt will be wheelchair level at discharge and will need to consider power wheelchair options to maximize functional independence.  ? ?Patient continues to demonstrate the following deficits muscle weakness and muscle joint tightness, decreased cardiorespiratoy endurance, unbalanced muscle activation and motor apraxia, decreased awareness and decreased problem solving, and decreased sitting balance, decreased standing balance, decreased postural control, hemiplegia, and decreased balance strategies and therefore will continue to benefit from skilled PT intervention to increase functional independence with mobility. ? ?Patient not progressing toward long term goals.  See goal revision..   See POC note for details ? ?PT Short Term Goals ?Week 1:  PT Short Term Goal 1 (Week 1): Pt will complete bed mobility with modA ?PT Short Term Goal 1 - Progress (Week 1): Not met ?PT Short Term Goal 2 (Week 1): Pt will complete bed<>chair transfer with maxA and LRAD ?PT Short Term Goal 2 - Progress (Week 1): Not met ?PT Short Term Goal 3 (Week 1): Pt will initiate gait training ?PT Short Term Goal 3 - Progress (Week 1): Not met ?PT Short Term Goal 4  (Week 1): Pt will tolerate sitting OOB in chair for >2 hours outside of therapy ?PT Short Term Goal 4 - Progress (Week 1): Met ?Week 2:  PT Short Term Goal 1 (Week 2): Pt will complete bed mobility with modA ?PT Short Term Goal 2 (Week 2): Pt will complete bed<>chair transfers with maxA and LRAD ?PT Short Term Goal 3 (Week 2): Pt will propel herself 547fin wheelchair with modA ? ?Skilled Therapeutic Interventions/Progress Updates:  ?   ?Pt supine in bed to start session - agreeable to PT tx. Reports 2/10 R knee pain - has lidocaine patch on. Discussed at length with her our PT goals, DC planning, and current mobility limitations. Asked for home measurements to determine wheelchair accessibility. Also introduced the idea of power wheelchair for means of mobility moving forward. Pt not ready to make that decision and wanted to talk with her husband first. We discussed other anticipated DME needs such as hospital bed, sliding board, and likely a wheelchair van for transportation.  ? ?Pt completed supine<>sitting EOB with maxA of 1 person with HOB fully elevated - required totalA for forward scooting to EOB. Used [employer] to transfer from EOB to w/c, towards her weaker L side, with +2 totalA. Pt with difficulty reaching the ground with her feet and would benefit from step stool to allow her to use LE to assist.  ? ?Transported to main rehab gym for time management and used sliding board with +2 maxA for transferring to her R side with step stool under her. Able to sit unsupported with supervision. Worked on lateral scooting on mat table in  both L to R directions but she required totalA to overcome weakness, soft tissue from body habitus. She lacks adequate forward weight shifting during lateral scooting and would benefit from further practice with this. We also tried utilizing yoga blocks for UE 's to assist with more leverage during scooting but really limited ability scoot. Worked on UE there-ex with 4# dumbbell  on R - shldr press, shldr abd, shldr punches, and unweighted tricep extension - cues for sequencing and muscle activation as well as AAROM for weighted exercises.  ? ?Used Clarise Cruz Plus to initiate standing with dependant assist and removed footboard to allow ability to initiate gait. With +3 assist (1 person steering Sara Plus and 2 facilitating gait), she was able to ambulate ~4-1f. Facilitation primarily needed for lateral weight shifting and advancing RLE in swing.  ? ?Transported back to her room in w/c and she remained seated with all needs met, call bell in lap.  ? 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  ? ?Therapy/Group: Individual Therapy ? ?Thelton Graca P Debbi Strandberg ?06/04/2021, 7:46 AM  ?

## 2021-06-04 NOTE — Progress Notes (Signed)
Occupational Therapy Session Note ? ?Patient Details  ?Name: Gwendolyn Morales ?MRN: [id number] ?Date of Birth: [birth year] ? ?Today's Date: 06/04/2021 ?OT Individual Time: 7017-7939 ?OT Individual Time Calculation (min): 70 min  ? ? ?Short Term Goals: ?Week 1:  OT Short Term Goal 1 (Week 1): Pt will complete sit<>stand with max assist in prep for ADLs ?OT Short Term Goal 2 (Week 1): Pt will complete LB dressing with mod assist ?OT Short Term Goal 3 (Week 1): Pt will complete UB dressing with mod assist. ?OT Short Term Goal 4 (Week 1): Pt will complete squat pivot toilet transfer with mod assist. ? ?Skilled Therapeutic Interventions/Progress Updates:  ?  Subjective: Pt states she feels very nervous when sitting edge of bed or sitting up in the chair because she is afraid of falling.  C/o aching in left upper arm radiating into elbow.  Also requesting to wash and dress during session.  Pt communicated some verbally and some writing on notepad.  ?  Objective:  Pt semi reclined in bed. Supine to sit with HOB slightly elevated with mod assist.  Pt doffed shirt and bathed UB with min assist.  Bathed BLE using figure 4 position with min assist.  Donned skirt and shirt with min assist overhead.  Blocked practice of anterior trunk lean with target provided to encourage increased lean. STM to left upper traps followed by AAROM upper traps stretch prolonged holds x 3. Gentle PROM left shoulder all planes noting increased tone in IR plane limiting ER. Pt able to complete 10 reps with close supervision and increased time due to hesitancy and fear of falling.  Pt provided emotional support and educated pt on benefits of positive affirmation practice.  Pt selected affirmation of "You can do it" and attempted to utilize through remainder of session to reduce anxiety and increase self confidence.  Pt completed beezy board transfer with max assist +1.  Direct hand off to nurse and SPT.  Call bell in reach, seat alarm on.   ?  Assessment:   Pt making progress evidenced by increased active participation during [facility] board transfer requiring less assist.  Primary barriers today included fear of falling and generalized anxiety as well as left sided flexor tone and weakness. ?  Plan: Pt would benefit from further training on anterior trunk lean, lateral scoots, and upgrade possibly to standard sliding board transfer.  ? 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 ? ? ?Therapy/Group: Individual Therapy ? ?Dian Situ Taeler Winning ?06/04/2021, 10:59 AM ?

## 2021-06-04 NOTE — Progress Notes (Signed)
?                                                       PROGRESS NOTE ? ? ?Subjective/Complaints: ?No new complaints this morning ?Working on positive affirmations with Kendal HymenBonnie ?Writing well ?Trying to schedule family training with son ? ?ROS:  ?Pt denies SOB, abd pain, CP, N/V/C/D, and vision changes, +aphonic ? ? ?Objective: ?  ?No results found. ?Recent Labs  ?  06/03/21 ?1617  ?WBC 7.5  ?HGB 15.4*  ?HCT 48.4*  ?PLT 326  ? ? ?Recent Labs  ?  06/03/21 ?1157 06/03/21 ?1617  ?NA 142 144  ?K 3.7 4.0  ?CL 108 109  ?CO2 26 27  ?GLUCOSE 101* 94  ?BUN 18 19  ?CREATININE 0.95 1.06*  ?CALCIUM 10.5* 10.6*  ? ? ? ?Intake/Output Summary (Last 24 hours) at 06/04/2021 1202 ?Last data filed at 06/03/2021 [id number] ?Gross per 24 hour  ?Intake 360 ml  ?Output --  ?Net 360 ml  ?  ? ?  ? ?Physical Exam: ?Vital Signs ?Blood pressure (!) 143/80, pulse 69, temperature 98.2 ?F (36.8 ?C), temperature source Oral, resp. rate 16, height 5\' 10"  (1.778 m), weight 106.1 kg, SpO2 97 %. ? ? ? ?General: awake, alert, appropriate, NAD ?HENT: conjugate gaze; oropharynx moist ?CV: bradycardia ?Pulmonary: CTA B/L; no W/R/R- good air movement ?GI: soft, NT, ND, (+)BS ?Psychiatric: appropriate- flat affect ?Neurological: alert ?Neurologic: Cranial nerves II through XII intact, motor strength is 4/5 in right and 3 - Left deltoid, bicep, tricep, grip, hip flexor, knee extensors, ankle dorsiflexor and plantar flexor. High riding right shoulder.  ?Sensory exam normal sensation to light touch and proprioception in bilateral upper and lower extremities ?Hypophonic dysarthria ?Musculoskeletal: Full range of motion in all 4 extremities. No joint swelling  ?Right knee TTP ? Mental Status: She is alert.  ?   Sensory: Sensory deficit present.  ?   Motor: Weakness (mild left sided weakness after therapy in 2017--worse now) present.  ?   Comments: Left facial paresis with severe dysarthria with low voice volume--tends to keep mouth open. Left hemiplegia with emerging  flexor tone LUE.  ?Beasy board transfer total Ax2 ? ? ?Assessment/Plan: ?1. Functional deficits which require 3+ hours per day of interdisciplinary therapy in a comprehensive inpatient rehab setting. ?Physiatrist is providing close team supervision and 24 hour management of active medical problems listed below. ?Physiatrist and rehab team continue to assess barriers to discharge/monitor patient progress toward functional and medical goals ? ?Care Tool: ? ?Bathing ?   ?   ? Body parts bathed by helper: Buttocks, Front perineal area ?  ?  ?Bathing assist Assist Level: Dependent - Patient 0% ?  ?  ?Upper Body Dressing/Undressing ?Upper body dressing   ?What is the patient wearing?: Hospital gown only ?   ?Upper body assist Assist Level: Dependent - Patient 0% ?   ?Lower Body Dressing/Undressing ?Lower body dressing ? ? ?   ?What is the patient wearing?: Hospital gown only ? ?  ? ?Lower body assist Assist for lower body dressing: Dependent - Patient 0% ?   ? ?Toileting ?Toileting    ?Toileting assist Assist for toileting: Dependent - Patient 0% ?  ?  ?Transfers ?Chair/bed transfer ? ?Transfers assist ?   ? ?Chair/bed transfer assist level: Dependent - mechanical lift Huntley Dec(Sara  Plus) ?  ?  ?Locomotion ?Ambulation ? ? ?Ambulation assist ? ? Ambulation activity did not occur: Safety/medical concerns ? ?  ?  ?   ? ?Walk 10 feet activity ? ? ?Assist ? Walk 10 feet activity did not occur: Safety/medical concerns ? ?  ?   ? ?Walk 50 feet activity ? ? ?Assist Walk 50 feet with 2 turns activity did not occur: Safety/medical concerns ? ?  ?   ? ? ?Walk 150 feet activity ? ? ?Assist Walk 150 feet activity did not occur: Safety/medical concerns ? ?  ?  ?  ? ?Walk 10 feet on uneven surface  ?activity ? ? ?Assist Walk 10 feet on uneven surfaces activity did not occur: Safety/medical concerns ? ? ?  ?   ? ?Wheelchair ? ? ? ? ?Assist Is the patient using a wheelchair?: Yes ?Type of Wheelchair: Manual ?  ? ?Wheelchair assist level:  Dependent - Patient 0% ?Max wheelchair distance: 138ft  ? ? ?Wheelchair 50 feet with 2 turns activity ? ? ? ?Assist ? ?  ?  ? ? ?Assist Level: Dependent - Patient 0%  ? ?Wheelchair 150 feet activity  ? ? ? ?Assist ?   ? ? ?Assist Level: Dependent - Patient 0%  ? ?Blood pressure (!) 143/80, pulse 69, temperature 98.2 ?F (36.8 ?C), temperature source Oral, resp. rate 16, height 5\' 10"  (1.778 m), weight 106.1 kg, SpO2 97 %. ? ?Medical Problem List and Plan: ?1. 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 ?            -patient may shower ?            -ELOS/Goals: 14-20d Min/Mod A ? HFU scheduled ? Continue CIR- PT, OT and SLP ? Using stedy in gym today- required 2 person assist ?2.  Antithrombotics: ?-DVT/anticoagulation:  Pharmaceutical: Lovenox ?            -antiplatelet therapy: ASA/Plavix X 3 weeks followed by ASA alone.  ?3. Shoulder pain: tylenol prn. voltaren gel ordered. Kpad ordered.  ?4. Mood: LCSW to follow for evaluation and support.  ?            -antipsychotic agents: N/A ?5. Neuropsych: This patient is capable of making decisions on her own behalf. ?6. Skin/Wound Care: Routine pressure relief measures.  ?7. Fluids/Electrolytes/Nutrition: Monitor I/O. ?8. HTN: Monitor BP TID. Continue Norvasc 10 mg/day, hydralazine 25 mg every 8 hours, and metoprolol 100 mg BID. Supplement klor which will also help with HTN. Increase magnesium gluconate to 500mg  HS. D/c hctz given good control and bump in creatinine ? 4/23- BP controlled- con't regimen ? 10. CKD III: Monitor with routine checks. Recommended drinking 6-8 glasses of water per day. D/c HCTZ given worsening renal function. ?11. E coli UTI: Ceftriaxone started on 04/12 with resolution of leucocytosis.  ?12. Elevated LDL: On high dose Lipitor  ?13. Chronic intermittent  cough: Conitnue Mucinex BID ?14. Constipation: Will start miralax bid. Increase magnesium gluconate to 500 mg HS ?15. Undiagnosed OSA: To be started in sleep study per husband. ?16. Obesity: BMI 33.82: provide dietary education ?17. Hypokalemia: supplement klor. Repeat BMP today ?18. Right knee pain: XR shows severe patellofemoral arthritis. Lidocaine patch ordered. Apply voltaren gel, improved, continue this regimen ?19. Aphonic: continue SLP. Outpatient ENT follow-up. Discussed with patient. Respiratory muscle training ?20. ?OSA: will  need to follow-up with outpatient pulmonologist ?21. Bradycardia: continue Lopressor to 75mg  BID ? ?LOS: ?8 days ?A FACE TO FACE EVALUATION WAS PERFORMED ? ? P Finley Dinkel ?06/04/2021, 12:02 PM  ? ?  ?

## 2021-06-05 MED ORDER — DICLOFENAC SODIUM 1 % EX GEL
2.0000 g | Freq: Four times a day (QID) | CUTANEOUS | Status: DC
  Administered 2021-06-05 – 2021-06-18 (×39): 2 g via TOPICAL
  Filled 2021-06-05: qty 100

## 2021-06-05 NOTE — Patient Care Conference (Signed)
Inpatient RehabilitationTeam Conference and Plan of Care Update ?Date: 06/05/2021   Time: 11:12 AM  ? ? ?Patient Name: Gwendolyn Morales      ?Medical Record Number: [id number]  ?Date of Birth: Oct 20, 1965 ?Sex: Female         ?Room/Bed: 4M06C/4M06C-01 ?Payor Info: Payor: [place] MEDICAID PREPAID HEALTH PLAN / Plan: [place] MEDICAID [employer] [place] / Product Type: *No Product type* /   ? ?Admit Date/Time:  05/27/2021  2:44 PM ? ?Primary Diagnosis:  Subcortical infarction (HCC) ? ?Hospital Problems: Principal Problem: ?  Subcortical infarction [name]) ?Active Problems: ?  Stroke (cerebrum) (HCC) ? ? ? ?Expected Discharge Date: Expected Discharge Date: 06/18/21 ? ?Team Members Present: ?Physician leading conference: Dr. Claudette Laws ?Social Worker Present: Dossie Der, LCSW ?Nurse Present: Chana Bode, RN ?PT Present: Casimiro Needle, PT ?SLP Present: Other (comment) [name], SLP) ?PPS Coordinator present : [name], SLP ? ?   Current Status/Progress Goal Weekly Team Focus  ?Bowel/Bladder ? ? Pt inconcontinent of bowel and bladder  Pt will regain control of bladder/bowel  Continue to timed toileting, assess and address every shift and prn   ?Swallow/Nutrition/ Hydration ? ? N/A  N/A  N/A   ?ADL's ? ? max assist of 1 with second for guarding only during beezy board transfers; MinA UB self care; total A for LB self care; Significant anxiety and fear of falling limits progress despite max emotional support and coping/self soothing strategy training completed; Mild flexor tone noted in LUE/LLE; Right knee pain and left shoulder pain also limiting. Limited caregiver participation during sessions so far; due to likeliness of substantial caregiver assist needed at discharge, early caregiver edu/training necessary;  min assist; will need to downgrade soon most likely  self care training with hemi strategies, functional transfer training and balance, neuro re-ed, visual scanning/left attention training, pt training/edu,  dc planning   ?Mobility ? ? maxA bed mobility, supervision sitting balance, unable to fully stand with +2 assist - standing in the [employer]. +2 totalA SB transfers. Improved anxiety and less fearfull of falling. Continues to be significantly deconditioned and weak. 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 She will require wheelchair Zenaida Niece or ambulance ride home!  Downgraded from minA to [facility] on 4/25. Will DC ambulation goals with wheelchair level. Will consider power wheelchair??  Endurance and strengthening, LLE NMR, functional transfers, bed mobility, pt/fam ed, PWC eval?   ?Communication ? ? Mod to Max A  Supervision A for 90% intelligibility at phrase and conversation level; will plan to downgrade goals  ongoing speech intelligibility strategy training with structured tasks, diaphragmatic breathing exercises, bearing weight with vocalizations/verbalizations   ?Safety/Cognition/ Behavioral Observations ? Min A to Max A depending on the task and fatigue  Supervision A - complex daily situation problem-solving and alternating attention  functional tasks involving money, error awareness, scheduling   ?Pain ? ? Denies pain  Pt will remain free of pain  Assess and address pain every shift and prn   ?Skin ? ? Redness noted to bottom, groin and abdominal folds  Improved skin integrity  Assess and address skin every shift and prn   ? ? ?Discharge Planning:  ?Will get husband and son in sooner rather than later for education due to amount of care pt will require at discharge   ?Team Discussion: ?Patient with limited participation due to pain in knee, and shoulder with  anxiety and fear of falling complicated by mild  flexor tone in right leg and premorbid right side weakness with new left side affected.  ? ?Patient on target to meet rehab goals: ?Currently needs max assist for sitting. Incontinent on toileting protocol. Needs total assist for slide board transfers. Needs max assist for  transfers and min assist for upper body care and total assist for lower body care/  ? ?*See Care Plan and progress notes for long and short-term goals.  ? ?Revisions to Treatment Plan:  ?[facility] board trials ?Power chair consult ?OP ENT referral for aphasia ?Downgraded goals to mod -max assist ?Diaphragmatic breathing exercises ?  ?Teaching Needs: ?Safety, toileting, transfers, medications, secondary risk managegment, etc  ?Current Barriers to Discharge: ?Decreased caregiver support, Home enviroment access/layout, and Incontinence ? ?Possible Resolutions to Barriers: ?Family education ?W/C van or ambulance transport ?  ? ? Medical Summary ?Current Status: knee and shoulder pain, incontinent, bradycardia, aphonia ? Barriers to Discharge: Medical stability ? Barriers to Discharge Comments: knee and shoulder pain, incontinent, bradycardia, aphonia ?Possible Resolutions to [employer]: schedule voltaren gel, continue bowel and bladder program, decreased lopressor and can maintain this dose, discussed plan for outpatient ENT follow-up ? ? ?Continued Need for Acute Rehabilitation Level of Care: The patient requires daily medical management by a physician with specialized training in physical medicine and rehabilitation for the following reasons: ?Direction of a multidisciplinary physical rehabilitation program to maximize functional independence : Yes ?Medical management of patient stability for increased activity during participation in an intensive rehabilitation regime.: Yes ?Analysis of laboratory values and/or radiology reports with any subsequent need for medication adjustment and/or medical intervention. : Yes ? ? ?I attest that I was present, lead the team conference, and concur with the assessment and plan of the team. ? ? ?Chana Bode B ?06/05/2021, 2:58 PM  ? ? ? ? ? ? ?

## 2021-06-05 NOTE — Progress Notes (Addendum)
?                                                       PROGRESS NOTE ? ? ?Subjective/Complaints: ?Patient's chart reviewed- No issues reported overnight ?Vitals signs stable  ?She has continued knee and shoulder pain ? ?ROS:  ?Pt denies SOB, abd pain, CP, N/V/C/D, and vision changes, +aphonic, +knee and shoulder pain ? ? ?Objective: ?  ?No results found. ?Recent Labs  ?  06/03/21 ?1617  ?WBC 7.5  ?HGB 15.4*  ?HCT 48.4*  ?PLT 326  ? ? ?Recent Labs  ?  06/03/21 ?1157 06/03/21 ?1617  ?NA 142 144  ?K 3.7 4.0  ?CL 108 109  ?CO2 26 27  ?GLUCOSE 101* 94  ?BUN 18 19  ?CREATININE 0.95 1.06*  ?CALCIUM 10.5* 10.6*  ? ? ? ?Intake/Output Summary (Last 24 hours) at 06/05/2021 1106 ?Last data filed at 06/05/2021 0818 ?Gross per 24 hour  ?Intake 296 ml  ?Output --  ?Net 296 ml  ?  ? ?  ? ?Physical Exam: ?Vital Signs ?Blood pressure 122/81, pulse 75, temperature 97.8 ?F (36.6 ?C), temperature source Oral, resp. rate 14, height 5\' 10"  (1.778 m), weight 106.1 kg, SpO2 96 %. ?Gen: no distress, normal appearing ?HEENT: oral mucosa pink and moist, NCAT ?Cardio: Reg rate ?Chest: normal effort, normal rate of breathing ?Abd: soft, non-distended ?Ext: no edema ?Psychiatric: appropriate- flat affect ?Neurological: alert ?Neurologic: Cranial nerves II through XII intact, motor strength is 4/5 in right and 3 - Left deltoid, bicep, tricep, grip, hip flexor, knee extensors, ankle dorsiflexor and plantar flexor. High riding right shoulder.  ?Sensory exam normal sensation to light touch and proprioception in bilateral upper and lower extremities ?Hypophonic dysarthria ?Musculoskeletal: Full range of motion in all 4 extremities. No joint swelling  ?Right knee TTP, TTP in her left shoulder ? Mental Status: She is alert.  ?   Sensory: Sensory deficit present.  ?   Motor: Weakness (mild left sided weakness after therapy in 2017--worse now) present.  ?   Comments: Left facial paresis with severe dysarthria with low voice volume--tends to keep mouth  open. 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  ?Beasy board transfer total Ax2 ? ? ?Assessment/Plan: ?1. Functional deficits which require 3+ hours per day of interdisciplinary therapy in a comprehensive inpatient rehab setting. ?Physiatrist is providing close team supervision and 24 hour management of active medical problems listed below. ?Physiatrist and rehab team continue to assess barriers to discharge/monitor patient progress toward functional and medical goals ? ?Care Tool: ? ?Bathing ?   ?   ? Body parts bathed by helper: Buttocks, Front perineal area ?  ?  ?Bathing assist Assist Level: Dependent - Patient 0% ?  ?  ?Upper Body Dressing/Undressing ?Upper body dressing   ?What is the patient wearing?: Hospital gown only ?   ?Upper body assist Assist Level: Dependent - Patient 0% ?   ?Lower Body Dressing/Undressing ?Lower body dressing ? ? ?   ?What is the patient wearing?: Hospital gown only ? ?  ? ?Lower body assist Assist for lower body dressing: Dependent - Patient 0% ?   ? ?Toileting ?Toileting    ?Toileting assist Assist for toileting: Dependent - Patient 0% ?  ?  ?Transfers ?Chair/bed transfer ? ?Transfers assist ?   ? ?Chair/bed transfer assist  level: Dependent - mechanical lift [facility]) ?  ?  ?Locomotion ?Ambulation ? ? ?Ambulation assist ? ? Ambulation activity did not occur: Safety/medical concerns ? ?  ?  ?   ? ?Walk 10 feet activity ? ? ?Assist ? Walk 10 feet activity did not occur: Safety/medical concerns ? ?  ?   ? ?Walk 50 feet activity ? ? ?Assist Walk 50 feet with 2 turns activity did not occur: Safety/medical concerns ? ?  ?   ? ? ?Walk 150 feet activity ? ? ?Assist Walk 150 feet activity did not occur: Safety/medical concerns ? ?  ?  ?  ? ?Walk 10 feet on uneven surface  ?activity ? ? ?Assist Walk 10 feet on uneven surfaces activity did not occur: Safety/medical concerns ? ? ?  ?   ? ?Wheelchair ? ? ? ? ?Assist Is the patient using a wheelchair?: Yes ?Type of Wheelchair: Manual ?   ? ?Wheelchair assist level: Dependent - Patient 0% ?Max wheelchair distance: 126ft  ? ? ?Wheelchair 50 feet with 2 turns activity ? ? ? ?Assist ? ?  ?  ? ? ?Assist Level: Dependent - Patient 0%  ? ?Wheelchair 150 feet activity  ? ? ? ?Assist ?   ? ? ?Assist Level: Dependent - Patient 0%  ? ?Blood pressure 122/81, pulse 75, temperature 97.8 ?F (36.6 ?C), temperature source Oral, resp. rate 14, height 5\' 10"  (1.778 m), weight 106.1 kg, SpO2 96 %. ? ?Medical Problem List and Plan: ?1. 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 ?            -patient may shower ?            -ELOS/Goals: 14-20d Min/Mod A ? HFU scheduled ? Continue CIR- PT, OT and SLP ?-Interdisciplinary Team Conference today   ? Using stedy in gym today- required 2 person assist, Max A bed mobility, fearful of falling, right sided weakness at baseline with new left sided weakness from current stroke. Total A lower body self care ?2.  Antithrombotics: ?-DVT/anticoagulation:  Pharmaceutical: Lovenox ?            -antiplatelet therapy: ASA/Plavix X 3 weeks followed by ASA alone.  ?3. Shoulder pain: tylenol prn. scheduled voltaren gel to QID. Kpad ordered.  ?4. Mood: LCSW to follow for evaluation and support.  ?            -antipsychotic agents: N/A ?5. Neuropsych: This patient is capable of making decisions on her own behalf. ?6. Skin/Wound Care: Routine pressure relief measures.  ?7. Fluids/Electrolytes/Nutrition: Monitor I/O. ?8. HTN: Monitor BP TID. Continue Norvasc 10 mg/day, hydralazine 25 mg every 8 hours, and metoprolol 100 mg BID. Supplement klor which will also help with HTN. Increase magnesium gluconate to 500mg  HS. D/c hctz given good control and bump in creatinine ? 4/23- BP controlled- con't regimen ? 10. CKD III: Monitor with routine checks.  Recommended drinking 6-8 glasses of water per day. D/c HCTZ given worsening renal function. ?11. E coli UTI: Ceftriaxone started on 04/12 with resolution of leucocytosis.  ?12. Elevated LDL: On high dose Lipitor  ?13. Chronic intermittent cough: Conitnue Mucinex BID ?14. Constipation: Will start miralax bid. Increase magnesium gluconate to 500 mg HS ?15. Undiagnosed OSA: To be started in sleep study per husband. ?16. Obesity: BMI 33.56: provide dietary education ?17. Hypokalemia: supplement  klor. Repeat BMP today ?18. Right knee pain: XR shows severe patellofemoral arthritis. Lidocaine patch ordered. Apply voltaren gel, improved, continue this regimen ?19. Aphonic: continue SLP. Outpatient ENT follow-up. Discussed with patient. Respiratory muscle training ?20. ?OSA: will need to follow-up with outpatient pulmonologist ?21. Bradycardia: cotninueLopressor to 75mg  BID ?22. Fear of falling: provided emotional support.  ?23. Flexor tone, mild, left side ?24. Cognitive impairment: continue SLP with emphasis on error awareness.  ? ?LOS: ?9 days ?A FACE TO FACE EVALUATION WAS PERFORMED ? ? P Gwendolyn Morales ?06/05/2021, 11:06 AM  ? ?  ?

## 2021-06-05 NOTE — Progress Notes (Signed)
Pt incontinent x2 this shift, despite attempts with timed toileting. Able to call at times, after incontinent episode. Denies pain this shift.  ?

## 2021-06-05 NOTE — Progress Notes (Signed)
Patient ID: Peri Kreft, female   DOB: July 25, 1965, 56 y.o.   MRN: [id number]  Met with pt and spoke with husband via telephone to discuss team conference goals of wife-mod assist and concerns of team about the amount of care will require at discharge and if he and son will be able to provide this level. Have scheduled husband and son to come in Sunday from 1:00-3:00 to begin family education and see if will be feasible for them to assist her. Both have done this before in 2017 and really want her to come home. Aware of only other option is SNF but not guarantee she will have therapy there due to Medicaid only. Discussed power chair and husband reports they live in a older home where the doorways are small, the bathroom door she had to go into sideways with walker. Will start Sunday and see how it goes and then work from there and have them come back again.  ?

## 2021-06-05 NOTE — Progress Notes (Signed)
Physical Therapy Session Note ? ?Patient Details  ?Name: Gwendolyn Morales ?MRN: [id number] ?Date of Birth: 1965-10-21 ? ?Today's Date: 06/05/2021 ?PT Individual Time: 8768-1157 ?PT Individual Time Calculation (min): 47 min  ? ?Short Term Goals: ?Week 1:  PT Short Term Goals - Week 1 ?PT Short Term Goal 1 (Week 1): Pt will complete bed mobility with modA ?PT Short Term Goal 1 - Progress (Week 1): Not met ?PT Short Term Goal 2 (Week 1): Pt will complete bed<>chair transfer with maxA and LRAD ?PT Short Term Goal 2 - Progress (Week 1): Not met ?PT Short Term Goal 3 (Week 1): Pt will initiate gait training ?PT Short Term Goal 3 - Progress (Week 1): Not met ?PT Short Term Goal 4 (Week 1): Pt will tolerate sitting OOB in chair for >2 hours outside of therapy ?PT Short Term Goal 4 - Progress (Week 1): Met ?PT Short Term Goals - Week 2 ?PT Short Term Goal 1 (Week 2): Pt will complete bed mobility with modA ?PT Short Term Goal 2 (Week 2): Pt will complete bed<>chair transfers with maxA and LRAD ?PT Short Term Goal 3 (Week 2): Pt will propel herself 80f in wheelchair with modA ? ?Skilled Therapeutic Interventions/Progress Updates:  ?Session 1: ?Patient supine in bed on entrance to room. Patient alert and agreeable to PT session.  ? ?Patient with no pain complaint at start of session. ? ?Therapeutic Activity: ?Bed Mobility: Patient performed supine --> sit requiring initiation for BLE to EOB. RLE moves off EOB and requires gravity assist for LLE. Pt noted to move trunk/ pelvis around to bring LLE around. Cued to push with Relbow and L hand to upright seated position requiring Min/ ModA for effort.  VC/ tc required for technique and effort throughout. ?Transfers: Patient performed sit<>stand transfers throughout session with use of Sara Plus. Some activation noted in BLE extensor chain on initial stance. Provided verbal cues for hand placement, foot placement, extensor push, pull with BUE, and glute set.  ? ?Neuromuscular  Re-ed: ?NMR facilitated during session with focus on sitting balance and core activation to facilitate stronger and improved forward lean. Pt guided in use of BITS for producing reach with BUE, forward lean, trunk rotation. First bout with pt reaching to dot on screen with bias of targets to R side. In 221m, pt is able to reach to targets using mostly RUE with 79.17% accuracy, hitting 19 targets with 6.27sec reaction time. No targets for fixation attended to. Second bout with instructions for targets on R side of screen ot be reached with RUE and targets on L with LUE and assist from therapist. 43.75% accuracy in 2 min with 16.62sec reaction time and 7 hits. ModA to complete reaches with LUE, but pt initiating each movement. Third bout  using R hand only again with no fixation and completes 22m44m with 96.3% accuracy, 52 hits and 3.29sec reaction time.  ? ?Final [facility] for sequencing numbers on rotating target. Pt completes with 100% with #s 1-10 and completes in 56 seconds with 3.6sec reaction time.  ? ?NMR performed for improvements in motor control and coordination, balance, sequencing, judgement, and self confidence/ efficacy in performing all aspects of mobility at highest level of independence.  ? ?Patient seated upright in w/c at end of session with brakes locked, belt alarm set, and all needs within reach on tray table to R side.  ? ?Session 2: ?Patient seated upright in w/c on entrance to room. Patient alert and  agreeable to PT session.  ? ?Patient with no pain complaint throughout session. ? ?Therapeutic Activity: ?Transfers: Patient performed sit<>stand transfers throughout session using Sara Plus to transfer and for standing activities. Provided verbal cues for activating BLE extensor chain musculature for assist in rising to stand.  ? ?Neuromuscular Re-ed: ?NMR facilitated during session with focus on standing tolerance and balance. Pt guided in standing activity using Clarise Cruz  Plus and requiring R hand reach to therapist hand placed in front of pt. Brought closer to pt as she is fearful to release grip of Sara Plus. With extensive encouragement she is able to release grip and touch fingertip to palm x3. She is able to tolerate 44mnx1/ 1.553m x1 standing bouts then c/o pain with standing in low back. Activity stopped and pt brought back to sit with relief of back pain.  ? ?Seated activity of bean bag toss to target board in order to encourage more forward lean and ant weight shift to assist with transfers. With encouragement throughout, pt is able to produce more forward lean under SUP with decreased demonstration of fear. Uses overhand, underhand and frisbee style toss to get bags to target x3 bouts of 11 bags.  ? ?NMR performed for improvements in motor control and coordination, balance, sequencing, judgement, and self confidence/ efficacy in performing all aspects of mobility at highest level of independence.  ? ?Patient seated upright  in w/c at end of session with brakes locked, belt alarm set, and all needs within reach.Pt setup for lunch tray arrival.  ? ? ?Session 3: ?Patient supine in bed on entrance to room. Patient alert and agreeable to PT session. Relates that she is in bed for toileting. Brief soiled and requiring change.  ? ?Patient with no pain complaint throughout session. ? ? ?Therapeutic Activity: ?Bed Mobility: Patient performed roll to L with CGA/ MinA and vc/ tc for technique.  Roll to R with ModA for bringing UE cross body and positioning of LLE for push to sidelying. Brief changed with MaxA. Supine --> sit with Mod/ MaxA and cues for bringing L hip around to assist LLE off EOB and to push up to seated position on EOB. VC/ tc required for technique throughout. ?Transfers: Slide board transfer to L requiring MaxA +2 to complete with several efforts and very [facility] at [facility]. Patient performed sit<>stand transfers using outside of // bars and required MaxA +2 for rise to  stand. 2" step added to loor outside of // bar platform for improved reach to Reaches 2/3 of way to full upright stance. Provided verbal cues for forward lean and BLE glute and quad activation. ? ?Neuromuscular Re-ed: ?NMR facilitated during session with focus on sitting balance and core activation along with LUE mm facilitation. Pt guided in seated touch of 1# weighted bar touch to ball placed at arm's length for improved forward lean and shoulder mobility. Requires Min to [facility]or full reach of LUE 85% of attempts with pt able to initiate movement in all attempts. VC throughout for increasing forward lean and attempt for increased thoracic rotation. Also guided with instruction for performing fwd/ bkwd scoot in w/c. Requires ModA to complete.  ? ?NMR performed for improvements in motor control and coordination, balance, sequencing, judgement, and self confidence/ efficacy in performing all aspects of mobility at highest level of independence.  ? ?Patient seated upright in w/c at end of session with brakes locked, belt alarm set, and all needs within reach. ? ? ?Therapy Documentation ?Precautions:  ?Precautions ?  Precautions: Fall ?Precaution Comments: left hemi, residual R sided weakness from 2017 CVA ?Restrictions ?Weight Bearing Restrictions: No ?General: ?  ?Vital Signs: ?  ?Pain: ?Pain Assessment ?Pain Scale: 0-10 ?Pain Score: 0-No pain ? ?Once brief instance of back pain in session #2 with standing. Provided relief with repositioning.  ? ?Therapy/Group: Individual Therapy ? ?Alger Simons PT, DPT ?06/05/2021, 9:22 AM  ?

## 2021-06-06 MED ORDER — LIDOCAINE HCL 1 % IJ SOLN
5.0000 mL | Freq: Once | INTRAMUSCULAR | Status: DC
  Filled 2021-06-06 (×2): qty 5

## 2021-06-06 NOTE — Progress Notes (Signed)
Physical Therapy Session Note ? ?Patient Details  ?Name: Gwendolyn Morales ?MRN: [id number] ?Date of Birth: Feb 26, 1965 ? ?Today's Date: 06/06/2021 ?PT Individual Time: 8250-0370 ?PT Individual Time Calculation (min): 40 min  ? ?Short Term Goals: ?Week 1:  PT Short Term Goal 1 (Week 1): Pt will complete bed mobility with modA ?PT Short Term Goal 1 - Progress (Week 1): Not met ?PT Short Term Goal 2 (Week 1): Pt will complete bed<>chair transfer with maxA and LRAD ?PT Short Term Goal 2 - Progress (Week 1): Not met ?PT Short Term Goal 3 (Week 1): Pt will initiate gait training ?PT Short Term Goal 3 - Progress (Week 1): Not met ?PT Short Term Goal 4 (Week 1): Pt will tolerate sitting OOB in chair for >2 hours outside of therapy ?PT Short Term Goal 4 - Progress (Week 1): Met ?Week 2:  PT Short Term Goal 1 (Week 2): Pt will complete bed mobility with modA ?PT Short Term Goal 2 (Week 2): Pt will complete bed<>chair transfers with maxA and LRAD ?PT Short Term Goal 3 (Week 2): Pt will propel herself 40f in wheelchair with modA ? ?Skilled Therapeutic Interventions/Progress Updates:  ? Received pt sitting in WC, pt agreeable to PT treatment, and reported pain in R knee (un-rated) but reported recently receiving pain medication. Recreational therapy present during session. Session with emphasis on functional mobility/transfers, generalized strengthening, and toileting/hygiene management. Checked to ensure brief was clean - pt found to be incontinent of urine. Pt transferred WC<>bed via slideboard with mod A +2 with cues for anterior weight shifting. Sit<>supine with max A for BLE management and rolled L/R with +2 assist and removed soiled brief and placed bedpan as pt began actively having BM. Pt ultimately unable to continue with BM on bedpan - performed peri-care dependently. Removed sacral dressing and notified RN to apply clean one. Continued rolling with +2 assist to place clean chuck pad and brief and scooted to H[facility] LLPwith mod A  of 1 and use of Trendelenburg bed position with pt pulling with RUE on headboard and therapist holding LEs in hooklying. Recreational therapist played music to improve spirits and mood and pt smiling and joking with therapists. Concluded session with pt semi-reclined in bed, needs within reach, and bed alarm on - pillow placed underneath LUE for support.  ? 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 ?Therapy/Group: Individual Therapy ?ABlenda Nicely?ABecky SaxPT, DPT  ?06/06/2021, 6:46 AM  ?

## 2021-06-06 NOTE — Plan of Care (Signed)
?  Problem: RH Expression Communication ?Goal: LTG Patient will increase speech intelligibility (SLP) ?Description: LTG: Patient will increase speech intelligibility at word/phrase/conversation level with cues, % of the time (SLP) ?Flowsheets (Taken 06/06/2021 1219) ?LTG: Patient will increase speech intelligibility (SLP): Minimal Assistance - Patient > 75% ?Level: ? Word ? Phrase ?Percent of time patient will use intelligible speech: 60 ?  ?Problem: RH Problem Solving ?Goal: LTG Patient will demonstrate problem solving for (SLP) ?Description: LTG:  Patient will demonstrate problem solving for basic/complex daily situations with cues  (SLP) ?Flowsheets (Taken 06/06/2021 1219) ?LTG: Patient will demonstrate problem solving for (SLP): Complex daily situations ?LTG Patient will demonstrate problem solving for: Minimal Assistance - Patient > 75% ?  ?Problem: RH Attention ?Goal: LTG Patient will demonstrate this level of attention during functional activites (SLP) ?Description: LTG:  Patient will will demonstrate this level of attention during functional activites (SLP) ?Flowsheets ?Taken 06/06/2021 1219 by Helaine Chess A, CCC-SLP ?LTG: Patient will demonstrate this level of attention during cognitive/linguistic activities with assistance of (SLP): Minimal Assistance - Patient > 75% ?Number of minutes patient will demonstrate attention during cognitive/linguistic activities: 5 ?Taken 05/28/2021 0852 by Dewaine Conger, CCC-SLP ?Patient will demonstrate during cognitive/linguistic activities the attention type of: Alternating ?Patient will demonstrate this level of attention during cognitive/linguistic activities in: Home ?  ?

## 2021-06-06 NOTE — Progress Notes (Signed)
Speech Language Pathology Daily Session Note ? ?Patient Details  ?Name: Gwendolyn Morales ?MRN: [id number] ?Date of Birth: [birth year] ? ?Today's Date: 06/06/2021 ?SLP Individual Time: 0900-1000 ?SLP Individual Time Calculation (min): 60 min ? ?Short Term Goals: ?Week 2: SLP Short Term Goal 1 (Week 2): Pt will complete functional problem-solving tasks r/t daily tasks with 80% accuracy given Mod A ?SLP Short Term Goal 2 (Week 2): Pt will complete therapeutic and functional visual and auditory alternating attention tasks with 80% accuracy given Mod A ?SLP Short Term Goal 3 (Week 2): Pt will utilize diaphragmatic breathing and speech intelligiblity strategies to achieve 50% intelligibility at the 1-2 word(s) level with Min-Mod A. ?SLP Short Term Goal 4 (Week 2): Pt will utilize strategy of double checking to increase error awareness during cognitive and communication tasks with Mod A. ? ?Skilled Therapeutic Interventions: ?Pt seen this date for skilled ST intervention targeting cognitive-linguistic goals outlined above. Pt encountered awake/alert and OOB in w/c. Recently finished working with OT. Slightly improved vocal intensity noted at the onset of today's session (single and short phrase level). Agreeable to ST intervention.  ? ?SLP facilitated today's session by providing the following skilled interventions during therapeutic and functional tx tasks: mass practice, corrective feedback, verbal and visual cues, and model prompts. ? ?Pt completed semi-complex money management/shopping task with 100% accuracy for math calculations given Supervision A. Recalled price of selected items being purchased with 100% accuracy with Mod I. Self-corrected x 1 with Mod I. Required Mod verbal and visual A for participating in visual alternating attention task and selection of items for particularly scenarios from sale flyer. Organized bills and coins with 100% accuracy with Mod I. Recalled room number and navigation back to her hospital  room with Mod I. Overall, pt with good participation and effort this date. Speech intelligibility was not directly addressed, though was noted to be ~50% intelligible at the word and short phrase level in a quiet environment. Utilized Biomedical scientist (writing) with Set-Up A when communication breakdown occurred. Will plan to address speech intelligibility next session. ? ?Session concluded with pt in w/c, safety belt donned, call bell reviewed and within reach, and all immediate needs met. Continue per current POC. ? ?Pain ?No pain reported; NAD ? ?Therapy/Group: Individual Therapy ? ?Othello Dickenson A Shayona Hibbitts ?06/06/2021, 11:59 AM ?

## 2021-06-06 NOTE — Plan of Care (Signed)
?  Problem: RH Tub/Shower Transfers ?Goal: LTG Patient will perform tub/shower transfers w/assist (OT) ?Description: LTG: Patient will perform tub/shower transfers with assist, with/without cues using equipment (OT) ?Outcome: Not Applicable ?Note: Pt will not be ambulatory at discharge and bathroom doorway is too narrow for w/c entry, therefore shower transfer not applicable. ?  ?Problem: RH Dressing ?Goal: LTG Patient will perform lower body dressing w/assist (OT) ?Description: LTG: Patient will perform lower body dressing with assist, with/without cues in positioning using equipment (OT) ?Flowsheets (Taken 06/06/2021 1208) ?LTG: Pt will perform lower body dressing with assistance level of: Maximal Assistance - Patient 25 - 49% ?  ?Problem: RH Toileting ?Goal: LTG Patient will perform toileting task (3/3 steps) with assistance level (OT) ?Description: LTG: Patient will perform toileting task (3/3 steps) with assistance level (OT)  ?Flowsheets (Taken 06/06/2021 1208) ?LTG: Pt will perform toileting task (3/3 steps) with assistance level: Maximal Assistance - Patient 25 - 49% ?  ?Problem: RH Toilet Transfers ?Goal: LTG Patient will perform toilet transfers w/assist (OT) ?Description: LTG: Patient will perform toilet transfers with assist, with/without cues using equipment (OT) ?Flowsheets (Taken 06/06/2021 1208) ?LTG: Pt will perform toilet transfers with assistance level of: Moderate Assistance - Patient 50 - 74% ?Note: Using sliding board ?  ?

## 2021-06-06 NOTE — Progress Notes (Signed)
?                                                       PROGRESS NOTE ? ? 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 Discussed trying trigger point injections tomorrow and she would like to try.  ?No new complaints ? ?ROS:  ?Pt denies SOB, abd pain, CP, N/V/C/D, and vision changes, +aphonic, +knee and shoulder pain ? ? ?Objective: ?  ?No results found. ?Recent Labs  ?  06/03/21 ?1617  ?WBC 7.5  ?HGB 15.4*  ?HCT 48.4*  ?PLT 326  ? ? ?Recent Labs  ?  06/03/21 ?1157 06/03/21 ?1617  ?NA 142 144  ?K 3.7 4.0  ?CL 108 109  ?CO2 26 27  ?GLUCOSE 101* 94  ?BUN 18 19  ?CREATININE 0.95 1.06*  ?CALCIUM 10.5* 10.6*  ? ? ? ?Intake/Output Summary (Last 24 hours) at 06/06/2021 1054 ?Last data filed at 06/06/2021 0700 ?Gross per 24 hour  ?Intake 474 ml  ?Output --  ?Net 474 ml  ?  ? ?  ? ?Physical Exam: ?Vital Signs ?Blood pressure (!) 146/98, pulse 70, temperature 97.8 ?F (36.6 ?C), temperature source Oral, resp. rate 14, height 5\' 10"  (1.778 m), weight 106.1 kg, SpO2 97 %. ?Gen: no distress, normal appearing ?HEENT: oral mucosa pink and moist, NCAT ?Cardio: Reg rate ?Chest: normal effort, normal rate of breathing ?Abd: soft, non-distended ?Ext: no edema ?Psychiatric: appropriate- flat affect ?Neurological: alert ?Neurologic: Cranial nerves II through XII intact, motor strength is 4/5 in right and 3 - Left deltoid, bicep, tricep, grip, hip flexor, knee extensors, ankle dorsiflexor and plantar flexor. High riding right shoulder.  ?Sensory exam normal sensation to light touch and proprioception in bilateral upper and lower extremities ?Hypophonic dysarthria ?Musculoskeletal: Full range of motion in all 4 extremities. No joint swelling. Tight levator scapulae on left side ?Right knee TTP, TTP in her left shoulder ? Mental Status: She is alert.  ?   Sensory: Sensory deficit present.  ?   Motor: Weakness (mild left sided weakness after therapy in 2017--worse now) present.  ?   Comments: Left facial paresis  with severe dysarthria with low voice volume--tends to keep mouth open. 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  ?Beasy board transfer total Ax2 ? ? ?Assessment/Plan: ?1. Functional deficits which require 3+ hours per day of interdisciplinary therapy in a comprehensive inpatient rehab setting. ?Physiatrist is providing close team supervision and 24 hour management of active medical problems listed below. ?Physiatrist and rehab team continue to assess barriers to discharge/monitor patient progress toward functional and medical goals ? ?Care Tool: ? ?Bathing ?   ?   ? Body parts bathed by helper: Buttocks, Front perineal area ?  ?  ?Bathing assist Assist Level: Dependent - Patient 0% ?  ?  ?Upper Body Dressing/Undressing ?Upper body dressing   ?What is the patient wearing?: Hospital gown only ?   ?Upper body assist Assist Level: Dependent - Patient 0% ?   ?Lower Body Dressing/Undressing ?Lower body dressing ? ? ?   ?What is the patient wearing?: Hospital gown only ? ?  ? ?Lower body assist Assist for lower body dressing: Dependent - Patient 0% ?   ? ?Toileting ?Toileting    ?Toileting assist Assist for toileting: Dependent - Patient 0% ?  ?  ?Transfers ?  Chair/bed transfer ? ?Transfers assist ?   ? ?Chair/bed transfer assist level: Dependent - mechanical lift [place] Dec([facility]) ?  ?  ?Locomotion ?Ambulation ? ? ?Ambulation assist ? ? Ambulation activity did not occur: Safety/medical concerns ? ?  ?  ?   ? ?Walk 10 feet activity ? ? ?Assist ? Walk 10 feet activity did not occur: Safety/medical concerns ? ?  ?   ? ?Walk 50 feet activity ? ? ?Assist Walk 50 feet with 2 turns activity did not occur: Safety/medical concerns ? ?  ?   ? ? ?Walk 150 feet activity ? ? ?Assist Walk 150 feet activity did not occur: Safety/medical concerns ? ?  ?  ?  ? ?Walk 10 feet on uneven surface  ?activity ? ? ?Assist Walk 10 feet on uneven surfaces activity did not occur: Safety/medical concerns ? ? ?  ?   ? ?Wheelchair ? ? ? ? ?Assist Is  the patient using a wheelchair?: Yes ?Type of Wheelchair: Manual ?  ? ?Wheelchair assist level: Dependent - Patient 0% ?Max wheelchair distance: 14350ft  ? ? ?Wheelchair 50 feet with 2 turns activity ? ? ? ?Assist ? ?  ?  ? ? ?Assist Level: Dependent - Patient 0%  ? ?Wheelchair 150 feet activity  ? ? ? ?Assist ?   ? ? ?Assist Level: Dependent - Patient 0%  ? ?Blood pressure (!) 146/98, pulse 70, temperature 97.8 ?F (36.6 ?C), temperature source Oral, resp. rate 14, height 5\' 10"  (1.778 m), weight 106.1 kg, SpO2 97 %. ? ?Medical Problem List and Plan: ?1. 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 ?            -patient may shower ?            -ELOS/Goals: 14-20d Min/Mod A ? HFU scheduled ? Continue CIR- PT, OT and SLP ? Using stedy in gym today- required 2 person assist, Max A bed mobility, fearful of falling, right sided weakness at baseline with new left sided weakness from current stroke. Total A lower body self care ?2.  Antithrombotics: ?-DVT/anticoagulation:  Pharmaceutical: Lovenox ?            -antiplatelet therapy: ASA/Plavix X 3 weeks followed by ASA alone.  ?3. Shoulder pain: tylenol prn. scheduled voltaren gel to QID. Kpad ordered.  ?4. Mood: LCSW to follow for evaluation and support.  ?            -antipsychotic agents: N/A ?5. Neuropsych: This patient is capable of making decisions on her own behalf. ?6. Skin/Wound Care: Routine pressure relief measures.  ?7. Fluids/Electrolytes/Nutrition: Monitor I/O. ?8. HTN: Monitor BP TID. Continue Norvasc 10 mg/day, hydralazine 25 mg every 8 hours, and metoprolol 100 mg BID. Supplement klor which will also help with HTN. Increase magnesium gluconate to 500mg  HS. D/c hctz given good control and bump in creatinine ? 4/23- BP controlled- con't regimen ? 10. CKD III: Monitor with  routine checks. Recommended drinking 6-8 glasses of water per day. D/c HCTZ given worsening renal function. ?11. E coli UTI: Ceftriaxone started on 04/12 with resolution of leucocytosis.  ?12. Elevated LDL: On high dose Lipitor  ?13. Chronic intermittent cough: Conitnue Mucinex BID ?14. Constipation: Will start miralax bid. Increase magnesium gluconate to 500 mg HS ?15. Undiagnosed OSA: To be started in sleep study per husband. ?16. Obesity: BMI  33.56: provide dietary education ?17. Hypokalemia: supplement klor. Repeat BMP today ?18. Right knee pain: XR shows severe patellofemoral arthritis. Lidocaine patch ordered. Apply voltaren gel, improved, continue this regimen ?19. Aphonic: continue SLP. Outpatient ENT follow-up. Discussed with patient. Respiratory muscle training ?20. ?OSA: will need to follow-up with outpatient pulmonologist ?21. Bradycardia: cotninue Lopressor to 75mg  BID ?22. Fear of falling: provided emotional support.  ?23. Flexor tone, mild, left side. Continue kpad.  ?24. Cognitive impairment: continue SLP with emphasis on error awareness.  ?25. Left levator scapulae tightness: lidocaine ordered for trigger point injection tomorrow morning.  ? ?LOS: ?10 days ?A FACE TO FACE EVALUATION WAS PERFORMED ? ? P Jastin Fore ?06/06/2021, 10:54 AM  ? ?  ?

## 2021-06-06 NOTE — Progress Notes (Signed)
Recreational Therapy Session Note ? ?Patient Details  ?Name: Gwendolyn Morales ?MRN: [id number] ?Date of Birth: Sep 22, 1965 ?Today's Date: 06/06/2021 ? ?Pain: no c/o ?Skilled Therapeutic Interventions/Progress Updates: Session focused on reducing feelings of anxiety through therapeutic use of music and self directed leisure activities.  Diversional supplies provided for pt use in the room.  Pt did not have a computer in the room.  LRT provided a computer on wheels to play preferred music to assist in relaxation outside of therapy session.  Pt appreciative of all of the above, smiling and laughing throughout the session. ? ?Session 2: ?Pain:  c/o R knee pain, unrated, premedicated ? ?Questioned pt if there was a need for toileting, pt declined.  Upon inspection, pt had been incontinent of urine,  Pt performed slide board transfer back to bed from w/c with mod assist +2.  Upon removal of soiled brief, pt actively began having a BM, bed pan placed.  Pt required +2 assist for rolling for hygiene, brief application & placement of chuck pad.  Therapeutic use of music provided this session as well.  Pt laughing and smiling, joking with therapists. ? ?Aliena Ghrist ?06/06/2021, 4:09 PM  ?

## 2021-06-06 NOTE — Progress Notes (Signed)
Occupational Therapy Weekly Progress Note ? ?Patient Details  ?Name: Gwendolyn Morales ?MRN: [id number] ?Date of Birth: 03/14/1965 ? ?Beginning of progress report period: May 28, 2021 ?End of progress report period: June 06, 2021 ? ?Today's Date: 06/06/2021 ?OT Individual Time: 4098-1191 ?OT Individual Time Calculation (min): 72 min  ? ? ?Patient has met 1 of 4 short term goals.  Pt making progress evidenced by uprgraded functional transfer from [facility] board to standard sliding board and able to do so with max assist.  Primary barriers include fear of falling and generalized anxiety with hesitancy with shifting weight anteriorly for functional transfers although this is slowly improving. She is also limited by significant weakness BLE and hypertonicity LUE and LLE with limited functional use.  She is also incontinent of bowel and bladder.  Pt's husband and son will require early caregiver education and training to more fully assess and determine whether they will be able to provide necessary assist at discharge. ? ?Patient continues to demonstrate the following deficits: muscle weakness, muscle joint tightness, and muscle paralysis, decreased cardiorespiratoy endurance, abnormal tone, unbalanced muscle activation, motor apraxia, decreased coordination, and decreased motor planning, decreased initiation, decreased problem solving, and delayed processing, and decreased sitting balance, decreased standing balance, decreased postural control, hemiplegia, and decreased balance strategies and therefore will continue to benefit from skilled OT intervention to enhance overall performance with BADL. ? ?Patient not progressing toward long term goals.  See goal revision..  Plan of care revisions:  . ? ?OT Short Term Goals ?Week 1:  OT Short Term Goal 1 (Week 1): Pt will complete sit<>stand with max assist in prep for ADLs ?OT Short Term Goal 1 - Progress (Week 1): Progressing toward goal ?OT Short Term Goal 2 (Week 1): Pt will  complete LB dressing with mod assist ?OT Short Term Goal 2 - Progress (Week 1): Not progressing ?OT Short Term Goal 3 (Week 1): Pt will complete UB dressing with mod assist. ?OT Short Term Goal 3 - Progress (Week 1): Met ?OT Short Term Goal 4 (Week 1): Pt will complete squat pivot toilet transfer with mod assist. ?OT Short Term Goal 4 - Progress (Week 1): Revised due to lack of progress ?Week 2:  OT Short Term Goal 1 (Week 2): Pt will complete sliding board transfer with drop arm BSC with mod assist. ?OT Short Term Goal 2 (Week 2): Pt will complete sit<>stand at stedy with max assist +2 achieving full upright stance in prep for ADLs. ?OT Short Term Goal 3 (Week 2): Pt will complete lateral lean pericare with mod assist seated on extra wide commode. ? ?Skilled Therapeutic Interventions/Progress Updates:  ?  Subjective: Pt states she wants to get washed up and needs her brief to be changed.  Pt c/o soreness and aching left neck and shoulder at rest.  Pt reports she does not feel comfortable with her son assisting her with any pericare or toileting and does not think her son will feel comfortable either.   ?  Objective:  Pt semi reclined in bed. She rolled left with mod assist and right with min assist for bed level toileting. Pt attempted to participate in pericare and clothing management at bed level however having difficulty reaching due to body habitus therefore required total assist.  Supine to sit with HOB all the way down with mod assist and mod Vcs.  Doffed shirt overhead with min assist.  Bathed UB with mod assist.  Donned skirt and shirt overhead with min assist.  Standard  sliding board transfer completed with max assist and second person present for guarding to ensure safety.  Pt educated on weight shifting anteriorly in order to reposition pelvis posteriorly in chair.  Pt able to successfully reposition after multiple attempts and encouragement.  Moist heating pad applied to left shoulder and soft tissue  massage completed to upper traps and levator scapulae with trigger point release technique.  Ktape applied to facilitate inhibition of levator scapulae.  Returned to room, pillow placed supporting LUE and encourage slight abduction of left shoulder in scapular plane. Call bell in reach, seat alarm on.   ?  Assessment:  See above. ?  Plan: See above ? 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 ? ? ?Therapy/Group: Individual Therapy ? ?Caryl Asp Rose Hippler ?06/06/2021, 11:13 AM  ?

## 2021-06-07 MED ORDER — LIDOCAINE HCL (PF) 1 % IJ SOLN
5.0000 mL | Freq: Once | INTRAMUSCULAR | Status: AC
  Administered 2021-06-10: 5 mL via INTRADERMAL
  Filled 2021-06-07 (×2): qty 30

## 2021-06-07 NOTE — Progress Notes (Signed)
Physical Therapy Session Note ? ?Patient Details  ?Name: Gwendolyn Morales ?MRN: [id number] ?Date of Birth: [birth year] ? ?Today's Date: 06/07/2021 ?PT Individual Time: 2376-2831 ?PT Individual Time Calculation (min): 57 min  ? ?Short Term Goals: ?Week 2:  PT Short Term Goal 1 (Week 2): Pt will complete bed mobility with modA ?PT Short Term Goal 2 (Week 2): Pt will complete bed<>chair transfers with maxA and LRAD ?PT Short Term Goal 3 (Week 2): Pt will propel herself 72ft in wheelchair with modA ? ?Skilled Therapeutic Interventions/Progress Updates:  ?  Patient received sitting up in wc, agreeable to PT. She reports pain in L hemi body, PT providing rest breaks, distractions and repositioning to assist with pain management. MD to provide trigger point release after this therapy session. PT transporting patient in wc to therapy gym for time management and energy conservation. MD in/out for AM assessment. Patient standing multiple times in [place] for improved upright posture and weight bearing through B LE. Patient with poor engagement of posterior chain maintaining excessive hip flexion. PT questioning hip flexor tightness? Patient becoming tearful asking why her progress has been so slow. PT providing emotional support and brief counseling regarding prognosis and improvements thus far. PT discussing lightweight wc vs powerchair. Patient conerned about powerchair fitting in her home, but reports that her current wc at home does fit through her doorways, but she is unable to propel it herself. With encouragement, patient agreeable to attempt standing in // bars. ModA + BUE support for partial stand x3 and then ModA for a full stand with 2nd person tactile cuing for glute activation. She remains with flexed posture and wound benefit from further hip extensor strengthening and hip flexor stretching. Patient returning to room in wc, seatbelt alarm on, call light within reach. PT providing patient with home measurement  worksheet for family.  ? 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 ? ? ? ?Therapy/Group: Individual Therapy ? ?Westley Foots ?Westley Foots, PT, DPT, CBIS ? ?06/07/2021, 7:43 AM  ?

## 2021-06-07 NOTE — Progress Notes (Signed)
Speech Language Pathology Daily Session Note ? ?Patient Details  ?Name: Gwendolyn Morales ?MRN: [id number] ?Date of Birth: 1965/02/17 ? ?Today's Date: 06/07/2021 ?SLP Individual Time: 9323-5573 ?SLP Individual Time Calculation (min): 58 min ? ?Short Term Goals: ?Week 2: SLP Short Term Goal 1 (Week 2): Pt will complete functional problem-solving tasks r/t daily tasks with 80% accuracy given Mod A ?SLP Short Term Goal 2 (Week 2): Pt will complete therapeutic and functional visual and auditory alternating attention tasks with 80% accuracy given Mod A ?SLP Short Term Goal 3 (Week 2): Pt will utilize diaphragmatic breathing and speech intelligiblity strategies to achieve 50% intelligibility at the 1-2 word(s) level with Min-Mod A. ?SLP Short Term Goal 4 (Week 2): Pt will utilize strategy of double checking to increase error awareness during cognitive and communication tasks with Mod A. ? ?Skilled Therapeutic Interventions:Skilled ST services focused on speech skills. SLP facilitated assessment of RMT to support breath and vocal quality/intensity. Pt scored and average of 5cm H20 on inhalation and 7cm H20 on exhalation, both well below [facility]. SLP set devices at 70% of average score, IMST was set at 5cm H20 (lowest possible level) and EMST set at 5cm H20. Pt was able to complete 10 repetitions of EMST, with a self-effort score of 6/10. SLP provided verbal and written instruction to complete IMST and EMST devices repetition of 10, 3 times per day. All questions answered to satisfaction. SLP brought pt to participate in "sing along" session in dayroom. Pt demonstrated ability to sing at word level with 40% accuracy. SLP handed off pt to nursing staff to return to pt's room. SLP recommends to continue skilled services. ? ?   ? ?Pain ?Pain Assessment ?Pain Score: 0-No pain ? ?Therapy/Group: Individual Therapy ? ?Jenifer Struve ?06/07/2021, 2:15 PM ?

## 2021-06-07 NOTE — Progress Notes (Signed)
Occupational Therapy Session Note ? ?Patient Details  ?Name: Gwendolyn Morales ?MRN: [id number] ?Date of Birth: Apr 20, 1965 ? ?Today's Date: 06/07/2021 ?OT Individual Time: 2924-4628 ?OT Individual Time Calculation (min): 75 min  ? ? ?Short Term Goals: ?Week 2:  OT Short Term Goal 1 (Week 2): Pt will complete sliding board transfer with drop arm BSC with mod assist. ?OT Short Term Goal 2 (Week 2): Pt will complete sit<>stand at stedy with max assist +2 achieving full upright stance in prep for ADLs. ?OT Short Term Goal 3 (Week 2): Pt will complete lateral lean pericare with mod assist seated on extra wide commode. ? ?Skilled Therapeutic Interventions/Progress Updates:  ? Subjective: Pt states she needs to be changed due to wet brief.  Left shoulder soreness present intermittently throughout session near anterior shoulder.   ?  Objective:  Pt semi reclined in bed. Pt rolled left and right with min assist while therapist completed total assist perciare and clothing management.  Supine to sit with HOB slightly elevated with min assist and cues for body mechanics.  Pt completed sliding board transfer to w/c with mod assist and increased time and mod multimodal cues for weight shifting and BLE placement.  Pt transported to ortho gym and completed anterior trunk leaning with target.  Neuro re-ed and postural control training completed to neck, shoulders, and trunk to promote upright posture and reduce kyphotic position in order to reduce pain and impingement in shoulders and facilitate functional posture in prep for self care and transfers.  Mirror placed in front of pt for visual feedback.  Returned to room. Call bell in reach, seat alarm on.   ?  Assessment:  Pt making progress evidenced by increased independence during sliding board transfers from max assist to mod assist.  Primary barriers today included left shoulder and neck pain. ?  Plan: Pt would benefit from further training on left upper trap and levator scapulae self  stretch, and progress to sit<>stand transfers due to increased ability to shift trunk anteriorly.   ? 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 ? ? ?Therapy/Group: Individual Therapy ? ?Gwendolyn Morales ?06/07/2021, 2:10 PM ?

## 2021-06-07 NOTE — Progress Notes (Signed)
?                                                       PROGRESS NOTE ? ? ?Subjective/Complaints: ?No new complaints this morning ?She continues to have pain in the left trapezius/levator scapulae. She would like to get the trigger point injection done today ? ?ROS:  ?Pt denies SOB, abd pain, CP, N/V/C/D, and vision changes, +aphonic, +knee and shoulder pain, +left sided levator scapulae pain ? ? ?Objective: ?  ?No results found. ?No results for input(s): WBC, HGB, HCT, PLT in the last 72 hours. ? ? ?No results for input(s): NA, K, CL, CO2, GLUCOSE, BUN, CREATININE, CALCIUM in the last 72 hours. ? ? ? ?Intake/Output Summary (Last 24 hours) at 06/07/2021 1052 ?Last data filed at 06/06/2021 1700 ?Gross per 24 hour  ?Intake 480 ml  ?Output --  ?Net 480 ml  ?  ? ?  ? ?Physical Exam: ?Vital Signs ?Blood pressure (!) 138/93, pulse 73, temperature 98.1 ?F (36.7 ?C), temperature source Oral, resp. rate 19, height 5\' 10"  (1.778 m), weight 106.1 kg, SpO2 96 %. ?Gen: no distress, normal appearing, BMI 33.56 ?HEENT: oral mucosa pink and moist, NCAT ?Cardio: Reg rate ?Chest: normal effort, normal rate of breathing ?Abd: soft, non-distended ?Ext: no edema ?Psychiatric: appropriate- flat affect ?Neurological: alert ?Neurologic: Cranial nerves II through XII intact, motor strength is 4/5 in right and 3 - Left deltoid, bicep, tricep, grip, hip flexor, knee extensors, ankle dorsiflexor and plantar flexor. High riding right shoulder.  ?Sensory exam normal sensation to light touch and proprioception in bilateral upper and lower extremities ?Hypophonic dysarthria ?Musculoskeletal: Full range of motion in all 4 extremities. No joint swelling. Tight levator scapulae on left side ?Right knee TTP, TTP in her left shoulder ? Mental Status: She is alert.  ?   Sensory: Sensory deficit present.  ?   Motor: Weakness (mild left sided weakness after therapy in 2017--worse now) present.  ?   Comments: Left facial paresis with severe dysarthria with  low voice volume--tends to keep mouth open. 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  ?Beasy board transfer total Ax2 ? ? ?Assessment/Plan: ?1. Functional deficits which require 3+ hours per day of interdisciplinary therapy in a comprehensive inpatient rehab setting. ?Physiatrist is providing close team supervision and 24 hour management of active medical problems listed below. ?Physiatrist and rehab team continue to assess barriers to discharge/monitor patient progress toward functional and medical goals ? ?Care Tool: ? ?Bathing ?   ?   ? Body parts bathed by helper: Buttocks, Front perineal area ?  ?  ?Bathing assist Assist Level: Dependent - Patient 0% ?  ?  ?Upper Body Dressing/Undressing ?Upper body dressing   ?What is the patient wearing?: Hospital gown only ?   ?Upper body assist Assist Level: Dependent - Patient 0% ?   ?Lower Body Dressing/Undressing ?Lower body dressing ? ? ?   ?What is the patient wearing?: Hospital gown only ? ?  ? ?Lower body assist Assist for lower body dressing: Dependent - Patient 0% ?   ? ?Toileting ?Toileting    ?Toileting assist Assist for toileting: Dependent - Patient 0% ?  ?  ?Transfers ?Chair/bed transfer ? ?Transfers assist ?   ? ?Chair/bed transfer assist level: Dependent - mechanical lift Plus) ?  ?  ?  Locomotion ?Ambulation ? ? ?Ambulation assist ? ? Ambulation activity did not occur: Safety/medical concerns ? ?  ?  ?   ? ?Walk 10 feet activity ? ? ?Assist ? Walk 10 feet activity did not occur: Safety/medical concerns ? ?  ?   ? ?Walk 50 feet activity ? ? ?Assist Walk 50 feet with 2 turns activity did not occur: Safety/medical concerns ? ?  ?   ? ? ?Walk 150 feet activity ? ? ?Assist Walk 150 feet activity did not occur: Safety/medical concerns ? ?  ?  ?  ? ?Walk 10 feet on uneven surface  ?activity ? ? ?Assist Walk 10 feet on uneven surfaces activity did not occur: Safety/medical concerns ? ? ?  ?   ? ?Wheelchair ? ? ? ? ?Assist Is the patient using a  wheelchair?: Yes ?Type of Wheelchair: Manual ?  ? ?Wheelchair assist level: Dependent - Patient 0% ?Max wheelchair distance: 19ft  ? ? ?Wheelchair 50 feet with 2 turns activity ? ? ? ?Assist ? ?  ?  ? ? ?Assist Level: Dependent - Patient 0%  ? ?Wheelchair 150 feet activity  ? ? ? ?Assist ?   ? ? ?Assist Level: Dependent - Patient 0%  ? ?Blood pressure (!) 138/93, pulse 73, temperature 98.1 ?F (36.7 ?C), temperature source Oral, resp. rate 19, height 5\' 10"  (1.778 m), weight 106.1 kg, SpO2 96 %. ? ?Medical Problem List and Plan: ?1. 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 ?            -patient may shower ?            -ELOS/Goals: 14-20d Min/Mod A ? HFU scheduled ? Continue CIR- PT, OT and SLP ? Mod A stand x3 ?2.  Antithrombotics: ?-DVT/anticoagulation:  Pharmaceutical: Lovenox ?            -antiplatelet therapy: ASA/Plavix X 3 weeks followed by ASA alone.  ?3. Shoulder pain: tylenol prn. scheduled voltaren gel to QID. Kpad ordered.  ?4. Mood: LCSW to follow for evaluation and support.  ?            -antipsychotic agents: N/A ?5. Neuropsych: This patient is capable of making decisions on her own behalf. ?6. Skin/Wound Care: Routine pressure relief measures.  ?7. Fluids/Electrolytes/Nutrition: Monitor I/O. ?8. HTN: Monitor BP TID. Continue Norvasc 10 mg/day, hydralazine 25 mg every 8 hours, and metoprolol 100 mg BID. Supplement klor which will also help with HTN. Increase magnesium gluconate to 500mg  HS. D/c hctz given good control and bump in creatinine ? 4/23- BP controlled- con't regimen ? 10. CKD III: Monitor with routine checks. Recommended drinking 6-8 glasses of water per day. D/c HCTZ given worsening renal function. ?11. E coli UTI: Ceftriaxone started on 04/12 with resolution of leucocytosis.  ?12. Elevated LDL:  On high dose Lipitor  ?13. Chronic intermittent cough: Conitnue Mucinex BID ?14. Constipation: Will start miralax bid. Increase magnesium gluconate to 500 mg HS ?15. Undiagnosed OSA: To be started in sleep study per husband. ?16. Obesity: BMI 33.56: provide dietary education ?17. Hypokalemia: supplement klor. Repeat BMP today ?18. Right knee pain: XR shows severe patellofemoral arthritis. Lidocaine patch ordered. Apply voltaren gel, improved, continue this regimen ?19. Aphonic: continue SLP. Outpatient ENT follow-up. Discussed with patient. Respiratory muscle training ?20. ?OSA: will need to follow-up with outpatient pulmonologist ?21. Bradycardia:  continue Lopressor to 75mg  BID ?22. Fear of falling: provided emotional support.  ?23. Flexor tone, mild, left side. Continue kpad.  ?24. Cognitive impairment: continue SLP with emphasis on error awareness.  ?25. Left levator scapulae tightness: will perform trigger point injections today to left levator scapulae once lidocaine injection arrives.  ? ?LOS: ?11 days ?A FACE TO FACE EVALUATION WAS PERFORMED ? ?Gwendolyn BolderKrutika P Millissa Morales ?06/07/2021, 10:52 AM  ? ?  ?

## 2021-06-08 MED ORDER — GERHARDT'S BUTT CREAM
TOPICAL_CREAM | CUTANEOUS | Status: DC | PRN
  Administered 2021-06-08: 1 via TOPICAL
  Filled 2021-06-08: qty 1

## 2021-06-08 NOTE — Progress Notes (Signed)
Occupational Therapy Session Note ? ?Patient Details  ?Name: Gwendolyn Morales ?MRN: [id number] ?Date of Birth: 09-13-1965 ? ?Today's Date: 06/08/2021 ?OT Individual Time: [id number] ?OT Individual Time Calculation (min): 60 min  ? ? ?Short Term Goals: ?Week 2:  OT Short Term Goal 1 (Week 2): Pt will complete sliding board transfer with drop arm BSC with mod assist. ?OT Short Term Goal 2 (Week 2): Pt will complete sit<>stand at stedy with max assist +2 achieving full upright stance in prep for ADLs. ?OT Short Term Goal 3 (Week 2): Pt will complete lateral lean pericare with mod assist seated on extra wide commode. ? ?Skilled Therapeutic Interventions/Progress Updates:  ?  Patient received sitting in wheelchair dressed, declined bathing/hygiene.  Agreeable to go to gym to address left shoulder pain as well as transfers.  Patient verbally expressing fear - seems to perseverate on this verbalization.  In level surface transfer, patient resistive of forward weight shift - pushes mass backward.  Therapist sat on sturdy chair in front of patient - allowing her to hold armrests of chair, then patient able to accept weight through feet and lift up off chair.  Used slide board to allow multiple squat pivots to get from wheelchair to mat table.  Once on mat table patient with strong posterior and right bias in sitting.  Increased tension in left limbs and in shoulder this tension leading to shoulder pain.  Worked on gentle weight shifts forward, backward, leftward.  This was helpful in reducing arm and leg tension and reducing arm pain.  Then worked on postural control - sitting upright - increasing some pelvis rotation forward and thoracic extension.  Patient could initiate this movement but had difficulty sustaining.  Transfer back to chair moving toward left with max assist - multiple squats.   ?Patient returned to room, chair alarm activated, and call bell in lap.   ? 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  ?Vital Signs: ?  ?Pain: ? 4/10 left shoulder, 3/10 right knee ? ? ? ? ?Therapy/Group: Individual Therapy ? ?Mariah Milling ?06/08/2021, 12:45 PM ?

## 2021-06-08 NOTE — Progress Notes (Signed)
?                                                       PROGRESS NOTE ? ? ?Subjective/Complaints: ? ?Pt states she uses diaper "like a baby" ?ROS:  ?Pt denies SOB, abd pain, CP, N/V/C/D, ? ?Objective: ?  ?No results found. ?No results for input(s): WBC, HGB, HCT, PLT in the last 72 hours. ? ? ?No results for input(s): NA, K, CL, CO2, GLUCOSE, BUN, CREATININE, CALCIUM in the last 72 hours. ? ? ? ?Intake/Output Summary (Last 24 hours) at 06/08/2021 0739 ?Last data filed at 06/07/2021 2127 ?Gross per 24 hour  ?Intake 240 ml  ?Output --  ?Net 240 ml  ? ?  ? ?  ? ?Physical Exam: ?Vital Signs ?Blood pressure 124/73, pulse (!) 59, temperature 97.7 ?F (36.5 ?C), temperature source Oral, resp. rate 18, height 5\' 10"  (1.778 m), weight 106.1 kg, SpO2 96 %. ?Gen: no distress, normal appearing, BMI 33.56 ? ?General: No acute distress ?Mood and affect are appropriate ?Heart: Regular rate and rhythm no rubs murmurs or extra sounds ?Lungs: Clear to auscultation, breathing unlabored, no rales or wheezes ?Abdomen: Positive bowel sounds, soft nontender to palpation, nondistended ?Extremities: No clubbing, cyanosis, or edema ? ?Sensory exam normal sensation to light touch and proprioception in bilateral upper and lower extremities ?Hypophonic dysarthria ?Musculoskeletal: Full range of motion in all 4 extremities. No joint swelling. Tight levator scapulae on left side ?Right knee TTP, TTP in her left shoulder ? Mental Status: She is alert.  ?   Sensory: Sensory deficit present.  ?   Motor: Weakness (mild left sided weakness after therapy in 2017--worse now) present.  ?   Comments: Left facial paresis with severe dysarthria with low voice volume--tends to keep mouth open. 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  ? ? ? ?Assessment/Plan: ?1. Functional deficits which require 3+ hours per day of interdisciplinary therapy in a comprehensive inpatient rehab setting. ?Physiatrist is providing close team supervision and 24 hour management  of active medical problems listed below. ?Physiatrist and rehab team continue to assess barriers to discharge/monitor patient progress toward functional and medical goals ? ?Care Tool: ? ?Bathing ?   ?   ? Body parts bathed by helper: Buttocks, Front perineal area ?  ?  ?Bathing assist Assist Level: Dependent - Patient 0% ?  ?  ?Upper Body Dressing/Undressing ?Upper body dressing   ?What is the patient wearing?: Hospital gown only ?   ?Upper body assist Assist Level: Dependent - Patient 0% ?   ?Lower Body Dressing/Undressing ?Lower body dressing ? ? ?   ?What is the patient wearing?: Hospital gown only ? ?  ? ?Lower body assist Assist for lower body dressing: Dependent - Patient 0% ?   ? ?Toileting ?Toileting    ?Toileting assist Assist for toileting: Dependent - Patient 0% ?  ?  ?Transfers ?Chair/bed transfer ? ?Transfers assist ?   ? ?Chair/bed transfer assist level: Dependent - mechanical lift Huntley Dec(Sara Plus) ?  ?  ?Locomotion ?Ambulation ? ? ?Ambulation assist ? ? Ambulation activity did not occur: Safety/medical concerns ? ?  ?  ?   ? ?Walk 10 feet activity ? ? ?Assist ? Walk 10 feet activity did not occur: Safety/medical concerns ? ?  ?   ? ?Walk 50 feet activity ? ? ?  Assist Walk 50 feet with 2 turns activity did not occur: Safety/medical concerns ? ?  ?   ? ? ?Walk 150 feet activity ? ? ?Assist Walk 150 feet activity did not occur: Safety/medical concerns ? ?  ?  ?  ? ?Walk 10 feet on uneven surface  ?activity ? ? ?Assist Walk 10 feet on uneven surfaces activity did not occur: Safety/medical concerns ? ? ?  ?   ? ?Wheelchair ? ? ? ? ?Assist Is the patient using a wheelchair?: Yes ?Type of Wheelchair: Manual ?  ? ?Wheelchair assist level: Dependent - Patient 0% ?Max wheelchair distance: 150ft  ? ? ?Wheelchair 50 feet with 2 turns activity ? ? ? ?Assist ? ?  ?  ? ? ?Assist Level: Dependent - Patient 0%  ? ?Wheelchair 150 feet activity  ? ? ? ?Assist ?   ? ? ?Assist Level: Dependent - Patient 0%  ? ?Blood pressure  124/73, pulse (!) 59, temperature 97.7 ?F (36.5 ?C), temperature source Oral, resp. rate 18, height 5\' 10"  (1.778 m), weight 106.1 kg, SpO2 96 %. ? ?Medical Problem List and Plan: ?1. 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 ?            -patient may shower ?            -ELOS/Goals: 14-20d Min/Mod A ? HFU scheduled ? Continue CIR- PT, OT and SLP ? Mod A stand x3 ?2.  Antithrombotics: ?-DVT/anticoagulation:  Pharmaceutical: Lovenox ?            -antiplatelet therapy: ASA/Plavix X 3 weeks followed by ASA alone.  ?3. Shoulder pain: tylenol prn. scheduled voltaren gel to QID. Kpad ordered.  ?4. Mood: LCSW to follow for evaluation and support.  ?            -antipsychotic agents: N/A ?5. Neuropsych: This patient is capable of making decisions on her own behalf. ?6. Skin/Wound Care: Routine pressure relief measures.  ?7. Fluids/Electrolytes/Nutrition: Monitor I/O. ?8. HTN: Monitor BP TID. Continue Norvasc 10 mg/day, hydralazine 25 mg every 8 hours, and metoprolol 100 mg BID. Supplement klor which will also help with HTN. Increase magnesium gluconate to 500mg  HS. D/c hctz given good control and bump in creatinine ?  ?Vitals:  ? 06/07/21 2044 06/08/21 0313  ?BP: 121/69 124/73  ?Pulse: 61 (!) 59  ?Resp: 18 18  ?Temp: (!) 97.5 ?F (36.4 ?C) 97.7 ?F (36.5 ?C)  ?SpO2: 96% 96%  ? ? ? 10. CKD III: Monitor with routine checks. Recommended drinking 6-8 glasses of water per day. D/c HCTZ given worsening renal function. ?11. E coli UTI: Ceftriaxone started on 04/12 with resolution of leucocytosis.  ?12. Elevated LDL: On high dose Lipitor  ?13. Chronic intermittent cough: Conitnue Mucinex BID ?14. Constipation: Will start miralax bid. Increase magnesium gluconate to 500 mg HS- type 6 BM this am  ?15. Undiagnosed OSA: To be started in sleep  study per husband. ?16. Obesity: BMI 33.56: provide dietary education ?17. Hypokalemia: supplement klor. Repeat BMP today ?18. Right knee pain: XR shows severe patellofemoral arthritis. Lidocaine patch ordered. Apply voltaren gel, improved, continue this regimen ?19. Aphonic: continue SLP. Outpatient ENT follow-up. Discussed with patient. Respiratory muscle training ?20. ?OSA: will need to follow-up with outpatient pulmonologist ?21. Bradycardia: continue Lopressor to 75mg  BID ?22. Fear of falling: provided emotional support.  ?23.  Flexor tone, mild, left side. Continue kpad.  ?24. Cognitive impairment: continue SLP with emphasis on error awareness.  ?25. Left levator scapulae tightness: will perform trigger point injections today to left levator scapulae once lidocaine injection arrives.  ? ?LOS: ?12 days ?A FACE TO FACE EVALUATION WAS PERFORMED ? ?Victorino Sparrow Jatavious Peppard ?06/08/2021, 7:39 AM  ? ?  ?

## 2021-06-09 NOTE — Progress Notes (Signed)
Occupational Therapy Session Note ? ?Patient Details  ?Name: Gwendolyn Morales ?MRN: [id number] ?Date of Birth: 1965-09-21 ? ?Today's Date: 06/09/2021 ?OT Individual Time: [id number] ?OT Individual Time Calculation (min): 45 min  ? ? ?Short Term Goals: ?Week 2:  OT Short Term Goal 1 (Week 2): Pt will complete sliding board transfer with drop arm BSC with mod assist. ?OT Short Term Goal 2 (Week 2): Pt will complete sit<>stand at stedy with max assist +2 achieving full upright stance in prep for ADLs. ?OT Short Term Goal 3 (Week 2): Pt will complete lateral lean pericare with mod assist seated on extra wide commode. ? ? ?Skilled Therapeutic Interventions/Progress Updates:  ?  Pt sitting up in w/c, two sons and husband present for family education.  Lengthy discussion and collaboration completed with family and pt regarding her current functional status, family availability, DME needs, and home environment layout.  Pts husband reports he is comfortable with assisting wife in any way needed and he does have experience with care taking of his parents when they were sick.  Pt's son reports a small amount of hesitancy in aiding pt with toileting and bathing, but he reports he is willing to try if it will help his mom come home.  Husband provided this therapist with doorway width measurements and picture of home layout.  Discussed possibility of using stedy for transfers versus transfer board and husband reports he would be receptive to out of pocket pay for a stedy if needed.  Husband and son report they will be available for more hands on training this Thursday afternoon and next weekend both Saturday and Sunday.  Notified CSW family availability for scheduling purposes.  Call bell in reach, seat alarm on. ? 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 ? ? ?Therapy/Group: Individual  Therapy ? ?Caryl Asp Shayli Altemose ?06/09/2021, 3:42 PM ?

## 2021-06-09 NOTE — Progress Notes (Signed)
Physical Therapy Session Note ? ?Patient Details  ?Name: Gwendolyn Morales ?MRN: [id number] ?Date of Birth: 08/19/1965 ? ?Today's Date: 06/09/2021 ?PT Individual Time: 1350-1430 ?PT Individual Time Calculation (min): 40 min  ? ?Short Term Goals: ?Week 2:  PT Short Term Goal 1 (Week 2): Pt will complete bed mobility with modA ?PT Short Term Goal 2 (Week 2): Pt will complete bed<>chair transfers with maxA and LRAD ?PT Short Term Goal 3 (Week 2): Pt will propel herself 28ft in wheelchair with modA ? ?Skilled Therapeutic Interventions/Progress Updates:  ?  Pt received seated in w/c in room with husband and son present for family education session. Dependent transport via w/c to/from therapy gym for time and energy conservation. Demonstrated how to safely setup w/c for slide board transfers to/from level mat table. Demonstrated how to assist pt with positioning and placement of SB for transfer. Pt able to complete slide board transfer w/c to mat table with max A x 2 to the R with increased time and cues needed for anterior weight shift, head/hips relationship, and for motor planning of transfer. Pt exhibits difficulty performing anterior lean due to fear of falling and motor planning deficits. Pt able to return to w/c via SB transfer to the R with max A from pt's husband and min to mod A from therapist as +2. Pt's husband demos good understanding of how to safely assist pt with transfer. Per pt's family and per OT the family will return this Thursday and Sat/Sun next weekend for continued family education. Pt left seated in w/c in room with needs in reach, family present. ? 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 ? ? ? ?Therapy/Group: Individual Therapy ? ? ?Peter Congo, PT, DPT, CSRS ? ?06/09/2021, 2:56 PM  ?

## 2021-06-09 NOTE — Progress Notes (Signed)
?                                                       PROGRESS NOTE ? ? ?Subjective/Complaints: ? ?No issues overnite  ?ROS:  ?Pt denies SOB, abd pain, CP, N/V/C/D, ? ?Objective: ?  ?No results found. ?No results for input(s): WBC, HGB, HCT, PLT in the last 72 hours. ? ? ?No results for input(s): NA, K, CL, CO2, GLUCOSE, BUN, CREATININE, CALCIUM in the last 72 hours. ? ? ? ?Intake/Output Summary (Last 24 hours) at 06/09/2021 0804 ?Last data filed at 06/09/2021 0759 ?Gross per 24 hour  ?Intake 1260 ml  ?Output --  ?Net 1260 ml  ? ?  ? ?  ? ?Physical Exam: ?Vital Signs ?Blood pressure 129/85, pulse 73, temperature 97.8 ?F (36.6 ?C), temperature source Oral, resp. rate 18, height 5\' 10"  (1.778 m), weight 106.1 kg, SpO2 97 %. ?Gen: no distress, normal appearing, BMI 33.56 ? ?General: No acute distress ?Mood and affect are appropriate ?Heart: Regular rate and rhythm no rubs murmurs or extra sounds ?Lungs: Clear to auscultation, breathing unlabored, no rales or wheezes ?Abdomen: Positive bowel sounds, soft nontender to palpation, nondistended ?Extremities: No clubbing, cyanosis, or edema ? ?Sensory exam normal sensation to light touch and proprioception in bilateral upper and lower extremities ?Hypophonic dysarthria ?Musculoskeletal: Full range of motion in all 4 extremities. No joint swelling. Tight levator scapulae on left side ?Right knee TTP, TTP in her left shoulder ? Mental Status: She is alert.  ?   Sensory: Sensory deficit present.  ?   Motor: Weakness (mild left sided weakness after therapy in 2017--worse now) present.  ?   Comments: Left facial paresis with severe dysarthria with low voice volume--tends to keep mouth open. 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  ? ? ? ?Assessment/Plan: ?1. Functional deficits which require 3+ hours per day of interdisciplinary therapy in a comprehensive inpatient rehab setting. ?Physiatrist is providing close team supervision and 24 hour management of active medical  problems listed below. ?Physiatrist and rehab team continue to assess barriers to discharge/monitor patient progress toward functional and medical goals ? ?Care Tool: ? ?Bathing ?   ?   ? Body parts bathed by helper: Buttocks, Front perineal area ?  ?  ?Bathing assist Assist Level: Dependent - Patient 0% ?  ?  ?Upper Body Dressing/Undressing ?Upper body dressing   ?What is the patient wearing?: Hospital gown only ?   ?Upper body assist Assist Level: Dependent - Patient 0% ?   ?Lower Body Dressing/Undressing ?Lower body dressing ? ? ?   ?What is the patient wearing?: Hospital gown only ? ?  ? ?Lower body assist Assist for lower body dressing: Dependent - Patient 0% ?   ? ?Toileting ?Toileting    ?Toileting assist Assist for toileting: Dependent - Patient 0% ?  ?  ?Transfers ?Chair/bed transfer ? ?Transfers assist ?   ? ?Chair/bed transfer assist level: Dependent - mechanical lift Huntley Dec([facility] Plus) ?  ?  ?Locomotion ?Ambulation ? ? ?Ambulation assist ? ? Ambulation activity did not occur: Safety/medical concerns ? ?  ?  ?   ? ?Walk 10 feet activity ? ? ?Assist ? Walk 10 feet activity did not occur: Safety/medical concerns ? ?  ?   ? ?Walk 50 feet activity ? ? ?Assist Walk 50  feet with 2 turns activity did not occur: Safety/medical concerns ? ?  ?   ? ? ?Walk 150 feet activity ? ? ?Assist Walk 150 feet activity did not occur: Safety/medical concerns ? ?  ?  ?  ? ?Walk 10 feet on uneven surface  ?activity ? ? ?Assist Walk 10 feet on uneven surfaces activity did not occur: Safety/medical concerns ? ? ?  ?   ? ?Wheelchair ? ? ? ? ?Assist Is the patient using a wheelchair?: Yes ?Type of Wheelchair: Manual ?  ? ?Wheelchair assist level: Dependent - Patient 0% ?Max wheelchair distance: 133ft  ? ? ?Wheelchair 50 feet with 2 turns activity ? ? ? ?Assist ? ?  ?  ? ? ?Assist Level: Dependent - Patient 0%  ? ?Wheelchair 150 feet activity  ? ? ? ?Assist ?   ? ? ?Assist Level: Dependent - Patient 0%  ? ?Blood pressure 129/85, pulse 73,  temperature 97.8 ?F (36.6 ?C), temperature source Oral, resp. rate 18, height 5\' 10"  (1.778 m), weight 106.1 kg, SpO2 97 %. ? ?Medical Problem List and Plan: ?1. 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 ?            -patient may shower ?            -ELOS/Goals: 14-20d Min/Mod A ? HFU scheduled ? Continue CIR- PT, OT and SLP ? Mod A stand x3 ?2.  Antithrombotics: ?-DVT/anticoagulation:  Pharmaceutical: Lovenox ?            -antiplatelet therapy: ASA/Plavix X 3 weeks followed by ASA alone.  ?3. Shoulder pain: tylenol prn. scheduled voltaren gel to QID. Kpad ordered.  ?4. Mood: LCSW to follow for evaluation and support.  ?            -antipsychotic agents: N/A ?5. Neuropsych: This patient is capable of making decisions on her own behalf. ?6. Skin/Wound Care: Routine pressure relief measures.  ?7. Fluids/Electrolytes/Nutrition: Monitor I/O. ?8. HTN: Monitor BP TID. Continue Norvasc 10 mg/day, hydralazine 25 mg every 8 hours, and metoprolol 100 mg BID. Supplement klor which will also help with HTN. Increase magnesium gluconate to 500mg  HS. D/c hctz given good control and bump in creatinine ?  ?Vitals:  ? 06/08/21 2022 06/09/21 0335  ?BP: 129/81 129/85  ?Pulse: 75 73  ?Resp: 18 18  ?Temp: 97.7 ?F (36.5 ?C) 97.8 ?F (36.6 ?C)  ?SpO2: 97% 97%  ? ? ? 10. CKD III: Monitor with routine checks. Recommended drinking 6-8 glasses of water per day. D/c HCTZ given worsening renal function. ?11. E coli UTI: Ceftriaxone started on 04/12 with resolution of leucocytosis.  ?12. Elevated LDL: On high dose Lipitor  ?13. Chronic intermittent cough: Conitnue Mucinex BID ?14. Constipation: Will start miralax bid. Increase magnesium gluconate to 500 mg HS- type 6 BM this am  ?15. Undiagnosed OSA: To be started in sleep study per husband. ?16. Obesity:  BMI 33.56: provide dietary education ?17. Hypokalemia: supplement klor. Repeat BMP today ?18. Right knee pain: XR shows severe patellofemoral arthritis. Lidocaine patch ordered. Apply voltaren gel, improved, continue this regimen ?19. Aphonic: continue SLP. Outpatient ENT follow-up. Discussed with patient. Respiratory muscle training ?20. ?OSA: will need to follow-up with outpatient pulmonologist ?21. Bradycardia:resolved ?22. Fear of falling: provided emotional support.  ?23. Flexor tone, mild, left side. Continue kpad.  ?24. Cognitive impairment:  continue SLP with emphasis on error awareness.  ? ? ?LOS: ?13 days ?A FACE TO FACE EVALUATION WAS PERFORMED ? ?Gwendolyn Morales ?06/09/2021, 8:04 AM  ? ?  ?

## 2021-06-10 LAB — BASIC METABOLIC PANEL
Anion gap: 7 (ref 5–15)
BUN: 20 mg/dL (ref 6–20)
CO2: 28 mmol/L (ref 22–32)
Calcium: 10.6 mg/dL — ABNORMAL HIGH (ref 8.9–10.3)
Chloride: 106 mmol/L (ref 98–111)
Creatinine, Ser: 1.11 mg/dL — ABNORMAL HIGH (ref 0.44–1.00)
GFR, Estimated: 59 mL/min — ABNORMAL LOW (ref >60)
Glucose, Bld: 98 mg/dL (ref 70–99)
Potassium: 3.9 mmol/L (ref 3.5–5.1)
Sodium: 141 mmol/L (ref 135–145)

## 2021-06-10 LAB — CBC
HCT: 44.6 % (ref 36.0–46.0)
Hemoglobin: 14.6 g/dL (ref 12.0–15.0)
MCH: 26.8 pg (ref 26.0–34.0)
MCHC: 32.7 g/dL (ref 30.0–36.0)
MCV: 81.8 fL (ref 80.0–100.0)
Platelets: 326 10*3/uL (ref 150–400)
RBC: 5.45 MIL/uL — ABNORMAL HIGH (ref 3.87–5.11)
RDW: 15.2 % (ref 11.5–15.5)
WBC: 8.4 10*3/uL (ref 4.0–10.5)
nRBC: 0 % (ref 0.0–0.2)

## 2021-06-10 NOTE — Progress Notes (Signed)
Occupational Therapy Session Note ? ?Patient Details  ?Name: Gwendolyn Morales ?MRN: [id number] ?Date of Birth: March 09, 1965 ? ?Today's Date: 06/10/2021 ?OT Individual Time: 0254-2706 ?OT Individual Time Calculation (min): 75 min  ? ? ?Short Term Goals: ?Week 2:  OT Short Term Goal 1 (Week 2): Pt will complete sliding board transfer with drop arm BSC with mod assist. ?OT Short Term Goal 2 (Week 2): Pt will complete sit<>stand at stedy with max assist +2 achieving full upright stance in prep for ADLs. ?OT Short Term Goal 3 (Week 2): Pt will complete lateral lean pericare with mod assist seated on extra wide commode. ? ?Skilled Therapeutic Interventions/Progress Updates:  ?Pt states right knee bothering her and feels sore. Voltaren applied to affected area and pillows placed in standing to reduce pain and tenderness. ?  Pt semi reclined in bed. Supine to sit with HOB slightly elevated with min assist and increased time.  Blocked practice anterior trunk leaning x 10 reps.  PROM to LUE to reduce tone and pain in prep for functional transfers.  Blocked practice completed sit<>stand EOB<>stedy and also from stedy perch<>standing using sheet wrapped around posterior hip/buttocks to provide manual hip extension facilitation in standing and overall total assist+2.  Pt also required max VC's and tactile cues to facilitate upright trunk and neck posturing.  Noted heavily pushing RUE elbow extension which hindered pts upright standing likely as a guarding mechanism secondary to fear of falling.  Attempted to provided tactile cues to flex elbow and relax arm during sit to stand however pt forcefully re-extending arm despite cues.  Transported pt to w/c using stedy then pt completed repositioning pelvis posteriorly in chair with significantly increased time for motor planning and repetitive blocked practicing of weight shifting technique.  Pt eventually able to move backwards all the way in w/c with max multimodal cueing and min assist.   Call bell in reach, seat alarm on.   ? ? 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 ? ? ?Therapy/Group: Individual Therapy ? ?Dian Situ Annarae Macnair ?06/10/2021, 2:51 PM ?

## 2021-06-10 NOTE — Progress Notes (Signed)
?                                                       PROGRESS NOTE ? ? ?Subjective/Complaints: ?Working on standing in [place] with Kendal HymenBonnie OT and [place] PT.  ?Trigger point injections performed for left upper back muscle tigtness ? ?ROS:  ?Pt denies SOB, abd pain, CP, N/V/C/D, +bilateral upper back muscle tightness ? ?Objective: ?  ?No results found. ?No results for input(s): WBC, HGB, HCT, PLT in the last 72 hours. ? ? ?No results for input(s): NA, K, CL, CO2, GLUCOSE, BUN, CREATININE, CALCIUM in the last 72 hours. ? ? ? ?Intake/Output Summary (Last 24 hours) at 06/10/2021 1036 ?Last data filed at 06/10/2021 [id number] ?Gross per 24 hour  ?Intake 840 ml  ?Output --  ?Net 840 ml  ?  ? ?  ? ?Physical Exam: ?Vital Signs ?Blood pressure 132/77, pulse 61, temperature 97.8 ?F (36.6 ?C), temperature source Oral, resp. rate 18, height 5\' 10"  (1.778 m), weight 106.1 kg, SpO2 96 %. ?Gen: no distress, normal appearing, BMI 33.56 ? ?General: No acute distress ?Mood and affect are appropriate ?Heart: Regular rate and rhythm no rubs murmurs or extra sounds ?Lungs: Clear to auscultation, breathing unlabored, no rales or wheezes ?Abdomen: Positive bowel sounds, soft nontender to palpation, nondistended ?Extremities: No clubbing, cyanosis, or edema ?Skin: minimal bleeding from injection site.  ?Sensory exam normal sensation to light touch and proprioception in bilateral upper and lower extremities ?Hypophonic dysarthria ?Musculoskeletal: Full range of motion in all 4 extremities. No joint swelling. Tight levator scapulae bilaterally ?Right knee TTP, TTP in her left shoulder ? Mental Status: She is alert.  ?   Sensory: Sensory deficit present.  ?   Motor: Weakness (mild left sided weakness after therapy in 2017--worse now) present.  ?   Comments: Left facial paresis with severe dysarthria with low voice volume--tends to keep mouth open. 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  ? ? ? ?Assessment/Plan: ?1. Functional deficits which  require 3+ hours per day of interdisciplinary therapy in a comprehensive inpatient rehab setting. ?Physiatrist is providing close team supervision and 24 hour management of active medical problems listed below. ?Physiatrist and rehab team continue to assess barriers to discharge/monitor patient progress toward functional and medical goals ? ?Care Tool: ? ?Bathing ?   ?   ? Body parts bathed by helper: Buttocks, Front perineal area ?  ?  ?Bathing assist Assist Level: Dependent - Patient 0% ?  ?  ?Upper Body Dressing/Undressing ?Upper body dressing   ?What is the patient wearing?: Hospital gown only ?   ?Upper body assist Assist Level: Dependent - Patient 0% ?   ?Lower Body Dressing/Undressing ?Lower body dressing ? ? ?   ?What is the patient wearing?: Hospital gown only ? ?  ? ?Lower body assist Assist for lower body dressing: Dependent - Patient 0% ?   ? ?Toileting ?Toileting    ?Toileting assist Assist for toileting: Dependent - Patient 0% ?  ?  ?Transfers ?Chair/bed transfer ? ?Transfers assist ?   ? ?Chair/bed transfer assist level: Dependent - mechanical lift [place] Dec([facility]) ?  ?  ?Locomotion ?Ambulation ? ? ?Ambulation assist ? ? Ambulation activity did not occur: Safety/medical concerns ? ?  ?  ?   ? ?Walk 10 feet activity ? ? ?Assist ?  Walk 10 feet activity did not occur: Safety/medical concerns ? ?  ?   ? ?Walk 50 feet activity ? ? ?Assist Walk 50 feet with 2 turns activity did not occur: Safety/medical concerns ? ?  ?   ? ? ?Walk 150 feet activity ? ? ?Assist Walk 150 feet activity did not occur: Safety/medical concerns ? ?  ?  ?  ? ?Walk 10 feet on uneven surface  ?activity ? ? ?Assist Walk 10 feet on uneven surfaces activity did not occur: Safety/medical concerns ? ? ?  ?   ? ?Wheelchair ? ? ? ? ?Assist Is the patient using a wheelchair?: Yes ?Type of Wheelchair: Manual ?  ? ?Wheelchair assist level: Dependent - Patient 0% ?Max wheelchair distance: 155ft  ? ? ?Wheelchair 50 feet with 2 turns  activity ? ? ? ?Assist ? ?  ?  ? ? ?Assist Level: Dependent - Patient 0%  ? ?Wheelchair 150 feet activity  ? ? ? ?Assist ?   ? ? ?Assist Level: Dependent - Patient 0%  ? ?Blood pressure 132/77, pulse 61, temperature 97.8 ?F (36.6 ?C), temperature source Oral, resp. rate 18, height 5\' 10"  (1.778 m), weight 106.1 kg, SpO2 96 %. ? ?Medical Problem List and Plan: ?1. 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 ?            -patient may shower ?            -ELOS/Goals: 14-20d Min/Mod A ? HFU scheduled ? Continue CIR- PT, OT and SLP ? 2 person assist to stand in [place] ?2.  Antithrombotics: ?-DVT/anticoagulation:  Pharmaceutical: Lovenox ?            -antiplatelet therapy: ASA/Plavix X 3 weeks followed by ASA alone.  ?3. Shoulder pain: tylenol prn. scheduled voltaren gel to QID. Kpad ordered.  ?4. Mood: LCSW to follow for evaluation and support.  ?            -antipsychotic agents: N/A ?5. Neuropsych: This patient is capable of making decisions on her own behalf. ?6. Skin/Wound Care: Routine pressure relief measures.  ?7. Fluids/Electrolytes/Nutrition: Monitor I/O. ?8. HTN: Monitor BP TID. Continue Norvasc 10 mg/day, hydralazine 25 mg every 8 hours, and metoprolol 100 mg BID. Supplement klor which will also help with HTN. Increase magnesium gluconate to 500mg  HS. D/c hctz given good control and bump in creatinine ?  ?Vitals:  ? 06/09/21 1940 06/10/21 0407  ?BP: 133/86 132/77  ?Pulse: 70 61  ?Resp: 18 18  ?Temp: 97.8 ?F (36.6 ?C) 97.8 ?F (36.6 ?C)  ?SpO2: 96% 96%  ? ? ? 10. CKD III: Monitor with routine checks. Recommended drinking 6-8 glasses of water per day. D/c HCTZ given worsening renal function. ?11. E coli UTI: Ceftriaxone started on 04/12 with resolution of leucocytosis.  ?12. Elevated LDL: On high dose Lipitor  ?13. Chronic  intermittent cough: Conitnue Mucinex BID ?14. Constipation: Will start miralax bid. Increase magnesium gluconate to 500 mg HS- type 6 BM this am  ?15. Undiagnosed OSA: To be started in sleep study per husband. ?16. Obesity: BMI 33.56: provide dietary education ?17. Hypokalemia: supplement klor. Repeat BMP today ?18. Right knee pain: XR shows severe patellofemoral arthritis. Lidocaine patch ordered. Apply voltaren gel, improved, continue this regimen ?19. Aphonic: continue SLP. Outpatient ENT follow-up. Discussed with patient. Respiratory muscle training ?20. ?OSA: will  need to follow-up with outpatient pulmonologist ?21. Bradycardia:resolved ?22. Fear of falling: provided emotional support.  ?23. Flexor tone, mild, left side. continuekpad.  ?24. Cognitive impairment: continue SLP with emphasis on error awareness.  ?25. Cervical myofascial syndrome: Trigger Point Injection ? ?Indication: Cervical myofascial pain not relieved by medication management and other conservative care. ? ?Informed consent was obtained after describing risk and benefits of the procedure with the patient, this includes bleeding, bruising, infection and medication side effects.  The patient wishes to proceed and has given written consent.  The patient was placed in a seated position.  The area of pain was marked and prepped with Betadine.  It was entered with a 25-gauge 1/2 inch needle and a total of 5 mL of 1% lidocaine and normal saline was injected into a total of 2 trigger points, after negative draw back for blood.  The patient tolerated the procedure well.   ? ?LOS: ?14 days ?A FACE TO FACE EVALUATION WAS PERFORMED ? ?Clint Bolder P Moksha Dorgan ?06/10/2021, 10:36 AM  ? ?  ?

## 2021-06-10 NOTE — Progress Notes (Signed)
Physical Therapy Session Note ? ?Patient Details  ?Name: Gwendolyn Morales ?MRN: [id number] ?Date of Birth: 1965/10/01 ? ?Today's Date: 06/10/2021 ?PT Individual Time: 1000-1015 + 5284-1324 ?PT Individual Time Calculation (min): 15 min + 54 min ? ?Short Term Goals: ?Week 2:  PT Short Term Goal 1 (Week 2): Pt will complete bed mobility with modA ?PT Short Term Goal 2 (Week 2): Pt will complete bed<>chair transfers with maxA and LRAD ?PT Short Term Goal 3 (Week 2): Pt will propel herself 15f in wheelchair with modA ? ?Skilled Therapeutic Interventions/Progress Updates:  ?   ?1st session: ?Pt sitting in w/c to start. Denies pain. NT at bedside preparing to assist her with brief change and pericare. Using [employer] assisted pt with standing and brief change with +2 assist. [place] and cues for engaging quads during weight bearing while NT assisted with brief change. Pt returned to w/c and we discussed DC planning topics such as home safety, DME rec's, and home measurements. Revisited our discussion re: PWC options and pt hesitant as she believes this would not fit in her home. We also discussed transfer methods with family (SB vs Stedy) and pt requesting to use SB. Informed her to have family come to therapy sessions as much as possible during the remainder of her stay to get hands on training and she voiced understanding. Left sitting in w/c, all needs met. ? ? ?2nd session: ?Pt sitting in w/c and agreeable to PT tx, denies pain. Transported to main rehab gym and wheeled inside // bars. Focused on sit<>stand transitions, standing tolerance, and BLE strengthening through weight bearing. With both UE 's on bar to assist with pulling to stand and using sheet behind hips and bluepad against her knees, pt able to rise to standing with modA of 1 person (!) and standing tolerance ~1-2 minutes with minA for balance through the sheet. VC for upright posture, glut activation and quad  activation, and postural awareness. Completed 1x6 stands !!.  Pt very pleased with her progress. Transported to day room rehab gym and setup with [employer] while seated in w/c. Resistance at 70cm/sec. Used the beat of a self-selected song to encourage cadence and full ROM which pt enjoyed. Transported back to room and concluded session in w/c with all needs met. Setup for lunch.  ? 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  ? ?Therapy/Group: Individual Therapy ? ?Krystl Wickware P Erminie Foulks ?06/10/2021, 7:20 AM  ?

## 2021-06-10 NOTE — Progress Notes (Signed)
Speech Language Pathology Daily Session Note ? ?Patient Details  ?Name: Gwendolyn Morales ?MRN: [id number] ?Date of Birth: [birth year] ? ?Today's Date: 06/10/2021 ?SLP Individual Time: 7106-2694 ?SLP Individual Time Calculation (min): 45 min ? ?Short Term Goals: ?Week 2: SLP Short Term Goal 1 (Week 2): Pt will complete functional problem-solving tasks r/t daily tasks with 80% accuracy given Mod A ?SLP Short Term Goal 2 (Week 2): Pt will complete therapeutic and functional visual and auditory alternating attention tasks with 80% accuracy given Mod A ?SLP Short Term Goal 3 (Week 2): Pt will utilize diaphragmatic breathing and speech intelligiblity strategies to achieve 50% intelligibility at the 1-2 word(s) level with Min-Mod A. ?SLP Short Term Goal 4 (Week 2): Pt will utilize strategy of double checking to increase error awareness during cognitive and communication tasks with Mod A. ? ?Skilled Therapeutic Interventions: ?Pt seen this date for skilled ST intervention targeting cognitive-linguistic goals outlined above. Pt encountered awake/alert and sitting semi-upright in bed. Verbalized upset with nursing staff being unable to help her get dressed this morning. Helped pt doff and don shirt and dress at bed level with Max A for motor planning and sequencing; unable to verbalize full sequence and hemi dressing techniques with donning/doffing shirt + dress. Agreeable to ST intervention.  ?  ?SLP facilitated today's session by providing the following skilled interventions during therapeutic and functional tx tasks: mass practice, corrective feedback, verbal and visual cues, and model prompts, diaphragmatic breathing techniques, and use of augmentative communication strategies. ? ?Pt completed functional task (e.g. planting seeds) targeting sequencing, semi-complex problem-solving due to novelty of task, and working memory with 100% completion given Min-Mod verbal and visual A for sequencing and problem-solving. Pt recalled  flowers selected and placement in room with Mod I. Completed 15 repetitions of EMST with Min-Mod verbal and visual A. Utilized multimodal augmentative communication systems with Sup to Min A, particularly for spelling words out and utilizing gestures. Speech intelligibility noted to be reduced this AM (<50% intelligible in quiet environment).  ? ?Session concluded with pt in bed, bed alarm on, call bell reviewed and within reach, and all immediate needs met. Continue per current POC. ? ?Pain ?Pt denies pain this session; NAD ? ?Therapy/Group: Individual Therapy ? ?Niyam Bisping A Celica Kotowski ?06/10/2021, 12:32 PM ?

## 2021-06-11 NOTE — Progress Notes (Signed)
Toileting offered and declined, pt requested to be returned to the bed, peri care provided, new brief applied, gowned, and resting comfortably w/ spouse at bedside.  ?

## 2021-06-11 NOTE — Progress Notes (Signed)
?                                                       PROGRESS NOTE ? ? ?Subjective/Complaints: ?Notes that trigger point injections helped relieve some of her left sided upper back pain, but does not want repeated on right side.  ? ?ROS:  ?Pt denies SOB, abd pain, CP, N/V/C/D, +bilateral upper back muscle tightness ? ?Objective: ?  ?No results found. ?Recent Labs  ?  06/10/21 ?1526  ?WBC 8.4  ?HGB 14.6  ?HCT 44.6  ?PLT 326  ? ? ? ?Recent Labs  ?  06/10/21 ?1526  ?NA 141  ?K 3.9  ?CL 106  ?CO2 28  ?GLUCOSE 98  ?BUN 20  ?CREATININE 1.11*  ?CALCIUM 10.6*  ? ? ? ? ?Intake/Output Summary (Last 24 hours) at 06/11/2021 1213 ?Last data filed at 06/11/2021 0840 ?Gross per 24 hour  ?Intake 1080 ml  ?Output --  ?Net 1080 ml  ?  ? ?  ? ?Physical Exam: ?Vital Signs ?Blood pressure 109/71, pulse 67, temperature 98 ?F (36.7 ?C), temperature source Oral, resp. rate 18, height 5\' 10"  (1.778 m), weight 106 kg, SpO2 95 %. ?Gen: no distress, normal appearing, BMI 33.53 ? ?General: No acute distress ?Mood and affect are appropriate ?Heart: Regular rate and rhythm no rubs murmurs or extra sounds ?Lungs: Clear to auscultation, breathing unlabored, no rales or wheezes ?Abdomen: Positive bowel sounds, soft nontender to palpation, nondistended ?Extremities: No clubbing, cyanosis, or edema ?Skin: minimal bleeding from injection site.  ?Sensory exam normal sensation to light touch and proprioception in bilateral upper and lower extremities ?Hypophonic dysarthria ?Musculoskeletal: Full range of motion in all 4 extremities. No joint swelling. Tight levator scapulae bilaterally ?Right knee TTP, TTP in her left shoulder ? Mental Status: She is alert.  ?   Sensory: Sensory deficit present.  ?   Motor: Weakness (mild left sided weakness after therapy in 2017--worse now) present.  ?   Comments: Left facial paresis with severe dysarthria with low voice volume--tends to keep mouth open. 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  ?Total A x2 for  standing ? ? ? ?Assessment/Plan: ?1. Functional deficits which require 3+ hours per day of interdisciplinary therapy in a comprehensive inpatient rehab setting. ?Physiatrist is providing close team supervision and 24 hour management of active medical problems listed below. ?Physiatrist and rehab team continue to assess barriers to discharge/monitor patient progress toward functional and medical goals ? ?Care Tool: ? ?Bathing ?   ?   ? Body parts bathed by helper: Buttocks, Front perineal area ?  ?  ?Bathing assist Assist Level: Dependent - Patient 0% ?  ?  ?Upper Body Dressing/Undressing ?Upper body dressing   ?What is the patient wearing?: Hospital gown only ?   ?Upper body assist Assist Level: Dependent - Patient 0% ?   ?Lower Body Dressing/Undressing ?Lower body dressing ? ? ?   ?What is the patient wearing?: Hospital gown only ? ?  ? ?Lower body assist Assist for lower body dressing: Dependent - Patient 0% ?   ? ?Toileting ?Toileting    ?Toileting assist Assist for toileting: Dependent - Patient 0% ?  ?  ?Transfers ?Chair/bed transfer ? ?Transfers assist ?   ? ?Chair/bed transfer assist level: Dependent - mechanical lift [place] Dec(Sara Plus) ?  ?  ?  Locomotion ?Ambulation ? ? ?Ambulation assist ? ? Ambulation activity did not occur: Safety/medical concerns ? ?  ?  ?   ? ?Walk 10 feet activity ? ? ?Assist ? Walk 10 feet activity did not occur: Safety/medical concerns ? ?  ?   ? ?Walk 50 feet activity ? ? ?Assist Walk 50 feet with 2 turns activity did not occur: Safety/medical concerns ? ?  ?   ? ? ?Walk 150 feet activity ? ? ?Assist Walk 150 feet activity did not occur: Safety/medical concerns ? ?  ?  ?  ? ?Walk 10 feet on uneven surface  ?activity ? ? ?Assist Walk 10 feet on uneven surfaces activity did not occur: Safety/medical concerns ? ? ?  ?   ? ?Wheelchair ? ? ? ? ?Assist Is the patient using a wheelchair?: Yes ?Type of Wheelchair: Manual ?  ? ?Wheelchair assist level: Dependent - Patient 0% ?Max wheelchair  distance: 126ft  ? ? ?Wheelchair 50 feet with 2 turns activity ? ? ? ?Assist ? ?  ?  ? ? ?Assist Level: Dependent - Patient 0%  ? ?Wheelchair 150 feet activity  ? ? ? ?Assist ?   ? ? ?Assist Level: Dependent - Patient 0%  ? ?Blood pressure 109/71, pulse 67, temperature 98 ?F (36.7 ?C), temperature source Oral, resp. rate 18, height 5\' 10"  (1.778 m), weight 106 kg, SpO2 95 %. ? ?Medical Problem List and Plan: ?1. 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 ?            -patient may shower ?            -ELOS/Goals: 14-20d Min/Mod A ? HFU scheduled ? Continue CIR- PT, OT and SLP ? 2 person assist to stand in [place], total A ?2.  Antithrombotics: ?-DVT/anticoagulation:  Pharmaceutical: Lovenox ?            -antiplatelet therapy: ASA/Plavix X 3 weeks followed by ASA alone.  ?3. Shoulder pain: tylenol prn. scheduled voltaren gel to QID. Kpad ordered. Discussed stronger pain medications but she defers.  ?4. Anxiety: discussed scheduling hydroxyzine but she defers given side effect profile.  ?5. Neuropsych: This patient is capable of making decisions on her own behalf. ?6. Skin/Wound Care: Routine pressure relief measures.  ?7. Fluids/Electrolytes/Nutrition: Monitor I/O. ?8. HTN: Monitor BP TID. Continue Norvasc 10 mg/day, hydralazine 25 mg every 8 hours, and metoprolol 100 mg BID. Supplement klor which will also help with HTN. Increase magnesium gluconate to 500mg  HS. D/c hctz given good control and bump in creatinine ?  ?Vitals:  ? 06/10/21 2023 06/11/21 0506  ?BP: 114/68 109/71  ?Pulse: 66 67  ?Resp: 18 18  ?Temp: 97.8 ?F (36.6 ?C) 98 ?F (36.7 ?C)  ?SpO2: 97% 95%  ? ? ? 10. CKD III: Monitor with routine checks. Recommended drinking 6-8 glasses of water per day. D/c HCTZ given worsening renal function. ?11. E coli UTI: Ceftriaxone  started on 04/12 with resolution of leucocytosis.  ?12. Elevated LDL: On high dose Lipitor  ?13. Chronic intermittent cough: Conitnue Mucinex BID ?14. Constipation: Will start miralax bid. Increase magnesium gluconate to 500 mg HS- type 6 BM this am  ?15. Undiagnosed OSA: To be started in sleep study per husband. ?16. Obesity: BMI 33.56: provide dietary education ?17. Hypokalemia: supplement klor. Repeat BMP today ?18. Right knee pain: XR shows  severe patellofemoral arthritis. Lidocaine patch ordered. Apply voltaren gel, improved, continue this regimen ?19. Aphonic: continue SLP. Outpatient ENT follow-up. Discussed with patient. Respiratory muscle training ?20. ?OSA: will need to follow-up with outpatient pulmonologist ?21. Bradycardia:resolved ?22. Fear of falling: provided emotional support.  ?23. Flexor tone, mild, left side. continuekpad.  ?24. Cognitive impairment: continue SLP with emphasis on error awareness.  ?25. Cervical myofascial syndrome: Trigger Point Injections provided some relief on right side, discussed repeating on left but she defers ?LOS: ?15 days ?A FACE TO FACE EVALUATION WAS PERFORMED ? ?Clint Bolder P Dick Hark ?06/11/2021, 12:13 PM  ? ?  ?

## 2021-06-11 NOTE — Progress Notes (Signed)
Physical Therapy Weekly Progress Note ? ?Patient Details  ?Name: Gwendolyn Morales ?MRN: [id number] ?Date of Birth: 1965-12-25 ? ?Beginning of progress report period: June 04, 2021 ?End of progress report period: Jun 11, 2021 ? ?Today's Date: 06/11/2021 ?PT Individual Time: 6222-9798 + 9211-9417  ?PT Individual Time Calculation (min): 45 min + 70 min ? ?Patient has met 1 of 3 short term goals.  Pt is making slow progress this reporting period. Overall, she requires modA for bed mobility (with hospital bed features), supervision for sitting balance, maxA for SB transfers, mod/maxA for sit<>stand in the // bars, standing tolerance max 2 minutes in // bars with modA. She continues to be primarily limited by L > R sided weakness, high anxiety and fearfulness of falling, aphonia, and generalized weakness and deconditioning. Therapy focus will transition to family education/training to prepare for upcoming DC with anticipated wheelchair level of mobility at mod/maxA for caregivers.  ? ?Patient continues to demonstrate the following deficits muscle weakness and muscle joint tightness, decreased cardiorespiratoy endurance, motor apraxia, and decreased sitting balance, decreased standing balance, decreased postural control, hemiplegia, and decreased balance strategies and therefore will continue to benefit from skilled PT intervention to increase functional independence with mobility. ? ?Patient progressing toward long term goals..  Continue plan of care. ? ?PT Short Term Goals ?Week 2:  PT Short Term Goal 1 (Week 2): Pt will complete bed mobility with modA ?PT Short Term Goal 1 - Progress (Week 2): Met ?PT Short Term Goal 2 (Week 2): Pt will complete bed<>chair transfers with maxA and LRAD ?PT Short Term Goal 2 - Progress (Week 2): Partly met ?PT Short Term Goal 3 (Week 2): Pt will propel herself 64f in wheelchair with modA ?PT Short Term Goal 3 - Progress (Week 2): Not met ?Week 3:  PT Short Term Goal 1 (Week 3): STG = LTG due  to ELOS ? ?Skilled Therapeutic Interventions/Progress Updates:  ?   ? ? ?1st session: ?Pt presenting sitting in w/c, agreeable to PT tx. Reports 2/10 pain in R knee (chronic) and 3/10 pain in L shoulder. Treatment to tolerance with rest breaks, distraction, and mobility provided for pain management. Transported in w/c to main rehab gym and wheeled inside // bars. Using bed sheet around hips and blue air-ex pad to fulcrum at her L knee, worked on standing tolerance, sit<>stand transitions, and BLE strengthening. She had x8 failed attempts at standing with this method, unable to achieve full upright with maxA provided from PT using the bed sheet. Able to complete x2 succesfull stands but only able to tolerate standing for ~30 seconds for each stand (yesterday was able to tolerate 5 stands lasting 2 minutes each.  ? ?Pt instructed in the following: ? ?Seated LAQ unweighted 2x10 ?Seated LAQ with 0.5lb ankle weight 1x10 ?Seated arm punches on R with 0.5lb ankle weight 1x10 ?Seated ball toss with on R with 0.5lb ankle weight 2x10 ?Seated ball kicks on L/R with 0.5lb ankle weight 1x10 ? ?Pt returned to her room and concluded session seated in w/c, call bell in lap, all needs met.  ? ?2nd session: ?Pt sitting in w/c to start, agreeable and denies pain. Discussed again DME options, specifically PWC vs manual w/c. Pt remains unsure of PWC. Brought her to DME closet to show her an example of [place] which helped her understand. Pt still doubtful and wanted to discuss with her husband before comitting. Informed her of time constraints due to lengthy  process of training her on [place] as well as obtaining a consult of [place] from [employer] - pt voiced understanding and will make final decision by tomorrow.  ? ?Transported to main rehab gym in manual w/c and we worked on her SB transfers from w/c to mat table. Placed step stool under LE to assist with positioning during transfer. She required totalA (<25%)  for both SB transfers towards her stronger R side. Used [place] Over-The-Back technique to facilitate forward weight shift during transition.  ? ?We then focused our attention on standing and sit<>stand transitions. She was positioned outside the // bars, facing them and the large mirror. Using both UE 's to assist with pulling to stand, and using bed sheet and foam pad as fulcrum at her L knee, she was able to rise to standing x5 times total with maxA of 1 person. During each stand, she was able to initiate upright posture (still some knee flexion and forward trunk), lasting ~1-2 minutes each stand. We also incorporated reaching tasks in standing to improve her confidence and promote weight bearing through LE and balance. Pt pleased with her progress and enjoyed this activity. ? ?Returned to her room where she remained seated in w/c, call bell in lap, all needs met.  ? ? ? 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  ? ?Therapy/Group: Individual Therapy ? ?Andi Mahaffy P Chelsi Warr ?06/11/2021, 7:28 AM  ?

## 2021-06-11 NOTE — Progress Notes (Signed)
Speech Language Pathology Weekly Progress and Session Note ? ?Patient Details  ?Name: Gwendolyn Morales ?MRN: [id number] ?Date of Birth: 1965-04-25 ? ?Beginning of progress report period: June 04, 2021 ?End of progress report period: Jun 11, 2021 ? ?Today's Date: 06/11/2021 ?SLP Individual Time: 1030-1130 ?SLP Individual Time Calculation (min): 60 min ? ?Short Term Goals: ?Week 2: SLP Short Term Goal 1 (Week 2): Pt will complete functional problem-solving tasks r/t daily tasks with 80% accuracy given Mod A ?SLP Short Term Goal 1 - Progress (Week 2): Met ?SLP Short Term Goal 2 (Week 2): Pt will complete therapeutic and functional visual and auditory alternating attention tasks with 80% accuracy given Mod A ?SLP Short Term Goal 2 - Progress (Week 2): Met ?SLP Short Term Goal 3 (Week 2): Pt will utilize diaphragmatic breathing and speech intelligiblity strategies to achieve 50% intelligibility at the 1-2 word(s) level with Min-Mod A. ?SLP Short Term Goal 3 - Progress (Week 2): Met ?SLP Short Term Goal 4 (Week 2): Pt will utilize strategy of double checking to increase error awareness during cognitive and communication tasks with Mod A. ?SLP Short Term Goal 4 - Progress (Week 2): Met ? ?  ?New Short Term Goals: ?Week 3: SLP Short Term Goal 1 (Week 3): STG's = LTG's due to ELOS ? ?Weekly Progress Updates: ?Pt has demonstrated steady progress this past week, and met 4 out of 4 short-term goals outlined above. Pt education is ongoing with family education scheduled for this weekend. Pt continues to require skilled ST intervention for motor speech deficits and cognitive-linguistic functioning. Will plan to continue per updated ST POC. Continue to recommend [place] or OP ST intervention and 24/7 supervision and assistance upon discharge. ? ? ?Intensity: Minumum of 1-2 x/day, 30 to 90 minutes ?Frequency: 3 to 5 out of 7 days ?Duration/Length of Stay: ~2 weeks ?Treatment/Interventions: Cognitive remediation/compensation;Cueing  hierarchy;Functional tasks;Patient/family education;Internal/external aids;Speech/Language facilitation;Therapeutic Exercise;Therapeutic Activities;Environmental controls ? ? ?Daily Session ? ?Skilled Therapeutic Interventions:  ?Pt seen this date for skilled ST intervention targeting speech intelligibility goals outlined above. Pt encountered awake/alert and sitting OOB in w/c; smiled upon SLP's arrival. Agreeable to ST intervention in apartment kitchen. ? ?SLP facilitated today's session by providing the following skilled interventions during therapeutic and functional tx tasks: RMST, model prompting, corrective feedback, biofeedback with device, diaphragmatic breathing techniques, and skilled education. ? ?Pt completed 3 set of 10 repetitions with EMST device set at lowest setting. Required consistent model prompts and Mod-Max verbal and visual A to perform expiration correctly. By the third set of 10 repetitions with EMST, pt verbalized at the single word level with 60% intelligibility. Unable to demonstrate motor planning and activation of IMST device; therefore, deferred this date. By the end of today's session, pt was completing EMST with adequate accuracy and effort with Sup A. Recommended completing 10 repetitions, 3 times per day; she verbalized understanding. Finally, pt completed barrier game with SLP; she was noted to verbalize sentences with <50% accuracy, and single to short phrase responses with ~60% intelligibility given Mod I and self-corrected independently. Subjectively, pt's intelligibility did improve following EMST use likely due to improve breath support and coordination with phonation of TVF's. Continue with EMST trials in therapy. ? ?Session concluded with pt in w/c, safety belt donned, call bell reviewed and within reach, and all immediate needs met. Continue per current ST POC. ? ?Pain ?No pain reported; NAD noted ? ?Therapy/Group: Individual Therapy ? ?Orlo Brickle A Antron Seth ?06/11/2021, 12:55  PM ? ? ? ? ? ? ?

## 2021-06-11 NOTE — Progress Notes (Signed)
Occupational Therapy Session Note ? ?Patient Details  ?Name: Gwendolyn Morales ?MRN: [id number] ?Date of Birth: 12-11-1965 ? ?Today's Date: 06/11/2021 ?OT Individual Time: 8115-7262 ?OT Individual Time Calculation (min): 57 min  ? ? ?Short Term Goals: ?Week 2:  OT Short Term Goal 1 (Week 2): Pt will complete sliding board transfer with drop arm BSC with mod assist. ?OT Short Term Goal 2 (Week 2): Pt will complete sit<>stand at stedy with max assist +2 achieving full upright stance in prep for ADLs. ?OT Short Term Goal 3 (Week 2): Pt will complete lateral lean pericare with mod assist seated on extra wide commode. ? ?Skilled Therapeutic Interventions/Progress Updates:  ?  Subjective: Pt states she can feel that she has been incontinent of urine and requesting to clean up. ?  Objective:  Pt semi reclined in bed. Rolled left and right with min-mod assist for dependent pericare and clothing management.  Pt completed supine to sit with min assist and increased time and step by step cues for body mechanics.  Pt completed sit to stand at stedy with sheet around hips to cue and manually facilitate hips into extension. Pt required max assist to stand.  Transported to w/c using stedy.  Pt repositioned posteriorly in chair with min assist.  Pt brushed teeth with setup, and donned shirt with supervision and increased time. Call bell in reach, seat alarm on.   ?  Assessment:  Pt making progress evidenced by increased independence with UB dressing from min assist to supervision.   ?  Plan: Pt would benefit from further training on sit<>stand and standing tolerance. Also attempting toileting using bedside commode   ? 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 ? ? ?Therapy/Group: Individual Therapy ? ?Dian Situ Gwendolyn Morales ?06/11/2021, 12:44 PM ?

## 2021-06-12 ENCOUNTER — Inpatient Hospital Stay (HOSPITAL_COMMUNITY): Payer: Medicaid Other

## 2021-06-12 NOTE — Progress Notes (Signed)
Physical Therapy Session Note ? ?Patient Details  ?Name: Gwendolyn Morales ?MRN: [id number] ?Date of Birth: [birth year] ? ?Today's Date: 06/12/2021 ?PT Individual Time: 3845-3646 + 8032-1224 ?PT Individual Time Calculation (min): 28 min  + 70 min ? ?Short Term Goals: ?Week 3:  PT Short Term Goal 1 (Week 3): STG = LTG due to ELOS ? ?Skilled Therapeutic Interventions/Progress Updates:  ?   ? ?1st session: ?Pt supine in bed to start - agreeable to PT tx - denies pain. Fully dressed and ready for therapy. Supine<>sitting with HOB fully elevated, requiring mod/maxA for LLE and trunk support - using bed rails to assist. Requires maxA for forwards scooting to EOB to achieve feet flat. Sitting balance with supervision while unsupported. Used Clarise Cruz Plus (no +2 available) and dependently transferred her from EOB to w/c with instruction to assist as much as possible to engage posterior chain during standing transition. Unable to work on standing in [place] due to concern for low battery level. While seated in w/c, we discussed home safety, DC planning, DME rec's. Pt spoke with her husband yesterday and still unsure of [place]. She's unable to reach the ground with her feet in her standard manual 20x18 w/c so retrieved a hemi-height 18x18 (no 20x18 hemi-height available). W/c left in room to trial this PM session. All needs met, call bell in lap.  ? ?2nd session: ?Pt supine in bed to start session - agreeable to PT tx and denies pain. Focused session on hemi propulsion using hemi-height 18x18 w/c. We also discussed potential functional transfers with family (Sliding board vs Stedy vs [place] lift). Pt requiring modA with hospital bed features to achieve supine<>sitting EOB. MaxA required for forward scooting to achieve feet flat. With +2 totalA, completed SB transfer from EOB towards the w/c, placed on her L side. Pt requiring multiple scoots in order to complete transfer. While sitting in hemi-height chair, pt continues to lack adequate  contact with ground with her stronger RLE. She was transported into open hallway to practice w/c propulsion using R side. She required totalA for propelling >50f using hemi technique due to inability to adequately use RLE to keep herself from veering L. Attempted layering socks and applying dycem to her heel to assist with grip/friction, no improvement. Wrapped L rim with yellow theraband and instructed her to use BUE to propel. She was able to propel herself 173fwith minA + 1041fith minA +49f71fth minA + 49ft61fh supervision! Speed slowed (2 minutes to cover 49ft)4f improved after each set. Pt transported back to her room for time management. Concluded session seated in w/c with all needs met, call bell in reach.  ? 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  ? ?Therapy/Group: Individual Therapy ? ?Javion Holmer P Jamorion Gomillion ?06/12/2021, 7:30 AM  ?

## 2021-06-12 NOTE — Progress Notes (Signed)
Patient called to have brief changed. Offered to put patient on the toilet, patient declined. Brief changed Peri care completed.  ? ?1346 Patient requested to lay back down. Declined toileting. Brief CDI ? ?1600 Brief wet, declined sitting on the toilet. Peri care and new brief applied  ? ?1830 Declined being toileted, brief dry. Declined putting gown on. Resting in bed. Call light and personal item within reach.  ?

## 2021-06-12 NOTE — Progress Notes (Signed)
Speech Language Pathology Daily Session Note ? ?Patient Details  ?Name: Gwendolyn Morales ?MRN: [id number] ?Date of Birth: 04/25/1965 ? ?Today's Date: 06/12/2021 ?SLP Individual Time: 0830-0915 ?SLP Individual Time Calculation (min): 45 min ? ?Short Term Goals: ?Week 3: SLP Short Term Goal 1 (Week 3): STG's = LTG's due to ELOS ? ?Skilled Therapeutic Interventions: ?Pt seen this date for skilled ST intervention targeting speech intelligibility goals outlined in care plan. Upon arrival, pt awake/alert and sitting upright in w/c. Agreeable to ST intervention.  ? ?SLP facilitated today's session by providing functional tasks targeting speech intelligibility. Pt was subjectively judged to be ~60% intelligible at the word and short phrase level in a quiet environment. <50% intelligibility at the sentence and conversational level; therefore, utilized writing to augment verbal communication with Mod I vs Min A in previous sessions. Completed 2 sets of 5 repetitions with EMST set on lowest setting. Pt with fair accuracy and effort, and benefited from Mod verbal and visual A for labial closure/seal and diaphragmatic breathing at the onset of expiration. Pt voiced concerning re: falling and stated that she has had multiple family member pass suddenly, with her father passing suddenly after falling in 2019. Emotional support and therapeutic listening provided, as well as positive reinforcement for her continued effort in therapy. Pt appreciative of intervention. ? ?Pt left in w/c with safety belt donned, call bell within reach, and all immediate needs met. Continue per current ST POC. ? ?Pain ?No pain reported this session; NAD ? ?Therapy/Group: Individual Therapy ? ?Bethany A Lutes ?06/12/2021, 1:15 PM ?

## 2021-06-12 NOTE — Progress Notes (Signed)
Toileting offered, patient declined.  ?

## 2021-06-12 NOTE — Patient Care Conference (Signed)
Inpatient RehabilitationTeam Conference and Plan of Care Update ?Date: 06/12/2021   Time: 11:10 AM  ? ? ?Patient Name: Gwendolyn Morales      ?Medical Record Number: [id number]  ?Date of Birth: Sep 23, 1965 ?Sex: Female         ?Room/Bed: 4M06C/4M06C-01 ?Payor Info: Payor: [employer] [place] / Plan: [place] MEDICA[employer] OF [place] / Product Type: *No Product type* /   ? ?Admit Date/Time:  05/27/2021  2:44 PM ? 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 ?Hospital Problems: Principal Problem: ?  Subcortical infarction [facility]) ?Active Problems: ?  Stroke (cerebrum) ([place]) ? ? ? ?Expected Discharge Date: Expected Discharge Date: 06/18/21 ? ?Team Members Present: ?Physician leading conference: Dr. Leeroy Cha ?Social Worker Present: Ovidio Kin, LCSW ?Nurse Present: Dorien Chihuahua, RN ?PT Present: Ginnie Smart, PT ?OT Present: Providence Lanius, OT ?SLP Present: Other (comment) Helaine Chess, SLP) ?PPS Coordinator present : Gunnar Fusi, SLP ? ?   Current Status/Progress Goal Weekly Team Focus  ?Bowel/Bladder ? ?   Incontinent of bowel and bladder   Incontinence managed   toileting  ?Swallow/Nutrition/ Hydration ? ? N/A  N/A  N/A   ?ADL's ? ? she is max assist at bed level for self care and +2 stedy for functional transfers with fear of falling, UBD and grooming with S  downgraded to max A toileting, LBD, mod bathroom transfers, min bathing  self care training with hemi strategies, functional transfer training and balance, neuro re-ed, visual scanning/left attention training, pt training/edu, dc planning   ?Mobility ? ? ModA bed mobility with hospital bed feautures (will need hospital bed), supervision sitting balance, +1-2 maxA for sit<>stand in // bars using sheet around hips to facilitate extension, Standing max 2 minutes in // bars. totalA +1 for SB transfers. Will require wheelchair Lucianne Lei or ambulance ride home.  Downgraded to modA butt will need to downgrade again to maxA....   Endurance/strengthening, functional transfers, bed mobility, sit<>stand and standing tolerance, DC planning, pt/fam ed   ?Communication ? ? Min A; beginning to self-correct or restate word if unclear. Min A for use of augmentative communication (gesturing, spelling word out loud, writing)  Min A for 60% intelligibility at the word and phrase level  RMST and diaphragmatic breathing interventions   ?Safety/Cognition/ Behavioral Observations ? Min A to Max A depending on the task and fatigue; does better with basic tasks for short durations of time.  Min A for alternating attention and complex daily situations  medication management and money management, energy convservation, family education   ?Pain ? ?   N/A        ?Skin ? ?   MASD   Manage skin care   MASD; skin cream and manage incontinence  ? ? ?Discharge Planning:  ?Team has begun family education with husband and son coming back tomorrow and this weekend. Husband wants to take pt home if can manage her needs   ?Team Discussion: ?Patient with low back pain and impaired sensation of bowel and bladder; MD ordered xray to r/o cauda equina. Continue to note right knee and back pain; addressed with non medication means per request. Super sweet patient with slow progress impaired by anxiety and weakness which is very challenging for her. ? ?Patient on target to meet rehab goals: ?Currently mod - max assist overall; supervision for sitting balance. Requires +2 assist stedy standing and max assist +2 to stand. Requires +2 total assist for slide board transfers and max assist to complete lower body  care. Needs min - max assist to use communication strategies.  ? ?*See Care Plan and progress notes for long and short-term goals.  ? ?Revisions to Treatment Plan:  ?Downgraded goals to max assist for PT and OT ?RMST; working on writing/gestures for communication  ?Teaching Needs: ?Safety, communication strategies, transfers, toileting, medications, secondary risk management,  etc. ?  ?Current Barriers to Discharge: ?Decreased caregiver support, Home enviroment access/layout, and Incontinence ? ?Possible Resolutions to Barriers: ?Family education ?DME: hospital bed, stedy,  ? ?W/C van or ambulance for transport ?  ? ? Medical Summary ?Current Status: obesity, bilateral upper back pain and right knee pain, anxiety, aphonic, lower back pain, loss of bowel and bladder sensation, HTN ? Barriers to Discharge: Medical stability;Weight;Neurogenic Bowel & Bladder ? Barriers to Discharge Comments: obesity, bilateral upper back pain and right knee pain, anxiety, aphonic, lower back pain, loss of bowel and bladder sensation, HTN ?Possible Resolutions to [employer]: continue dietary education, check lumbar XR today, provided education of how stroke can affect the nerves to bowel and bladder, continue lopressor ? ? ?Continued Need for Acute Rehabilitation Level of Care: The patient requires daily medical management by a physician with specialized training in physical medicine and rehabilitation for the following reasons: ?Direction of a multidisciplinary physical rehabilitation program to maximize functional independence : Yes ?Medical management of patient stability for increased activity during participation in an intensive rehabilitation regime.: Yes ?Analysis of laboratory values and/or radiology reports with any subsequent need for medication adjustment and/or medical intervention. : Yes ? ? ?I attest that I was present, lead the team conference, and concur with the assessment and plan of the team. ? ? ?Dorien Chihuahua B ?06/12/2021, 11:35 AM  ? ? ? ? ? ? ?

## 2021-06-12 NOTE — Progress Notes (Signed)
?                                                       PROGRESS NOTE ? ? ?Subjective/Complaints: ?Knee pain better but shoulder pain still present. Has not received heating pad and she would like to try this, ordered ?She does have low back pain ? ?ROS:  ?Pt denies SOB, abd pain, CP, N/V/C/D, +bilateral upper back muscle tightness, +right knee pain ? ?Objective: ?  ?No results found. ?Recent Labs  ?  06/10/21 ?1526  ?WBC 8.4  ?HGB 14.6  ?HCT 44.6  ?PLT 326  ? ? ? ?Recent Labs  ?  06/10/21 ?1526  ?NA 141  ?K 3.9  ?CL 106  ?CO2 28  ?GLUCOSE 98  ?BUN 20  ?CREATININE 1.11*  ?CALCIUM 10.6*  ? ? ? ? ?Intake/Output Summary (Last 24 hours) at 06/12/2021 1033 ?Last data filed at 06/12/2021 [id number] ?Gross per 24 hour  ?Intake 1080 ml  ?Output --  ?Net 1080 ml  ?  ? ?  ? ?Physical Exam: ?Vital Signs ?Blood pressure 126/87, pulse 82, temperature 97.6 ?F (36.4 ?C), resp. rate 16, height 5\' 10"  (1.778 m), weight 106 kg, SpO2 98 %. ?Gen: no distress, normal appearing, BMI 33.53 ? ?General: No acute distress ?Mood and affect are appropriate ?Heart: Regular rate and rhythm no rubs murmurs or extra sounds ?Lungs: Clear to auscultation, breathing unlabored, no rales or wheezes ?Abdomen: Positive bowel sounds, soft nontender to palpation, nondistended ?Extremities: No clubbing, cyanosis, or edema ?Skin: minimal bleeding from injection site.  ?Sensory exam normal sensation to light touch and proprioception in bilateral upper and lower extremities ?Hypophonic dysarthria ?Musculoskeletal: Full range of motion in all 4 extremities. No joint swelling. Tight levator scapulae bilaterally ?Right knee TTP, TTP in her left shoulder ? Mental Status: She is alert.  ?   Sensory: Sensory deficit present.  ?   Motor: Weakness (mild left sided weakness after therapy in 2017--worse now) present.  ?   Comments: Left facial paresis with severe dysarthria with low voice volume--tends to keep mouth open. 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  ?Total A  x2 for standing ?Psych: bright smile, +anxiety ? ? ? ?Assessment/Plan: ?1. Functional deficits which require 3+ hours per day of interdisciplinary therapy in a comprehensive inpatient rehab setting. ?Physiatrist is providing close team supervision and 24 hour management of active medical problems listed below. ?Physiatrist and rehab team continue to assess barriers to discharge/monitor patient progress toward functional and medical goals ? ?Care Tool: ? ?Bathing ?   ?   ? Body parts bathed by helper: Buttocks, Front perineal area ?  ?  ?Bathing assist Assist Level: Dependent - Patient 0% ?  ?  ?Upper Body Dressing/Undressing ?Upper body dressing   ?What is the patient wearing?: Hospital gown only ?   ?Upper body assist Assist Level: Dependent - Patient 0% ?   ?Lower Body Dressing/Undressing ?Lower body dressing ? ? ?   ?What is the patient wearing?: Hospital gown only ? ?  ? ?Lower body assist Assist for lower body dressing: Dependent - Patient 0% ?   ? ?Toileting ?Toileting    ?Toileting assist Assist for toileting: Dependent - Patient 0% ?  ?  ?Transfers ?Chair/bed transfer ? ?Transfers assist ?   ? ?Chair/bed transfer assist level: Dependent - mechanical  lift [facility]) ?  ?  ?Locomotion ?Ambulation ? ? ?Ambulation assist ? ? Ambulation activity did not occur: Safety/medical concerns ? ?  ?  ?   ? ?Walk 10 feet activity ? ? ?Assist ? Walk 10 feet activity did not occur: Safety/medical concerns ? ?  ?   ? ?Walk 50 feet activity ? ? ?Assist Walk 50 feet with 2 turns activity did not occur: Safety/medical concerns ? ?  ?   ? ? ?Walk 150 feet activity ? ? ?Assist Walk 150 feet activity did not occur: Safety/medical concerns ? ?  ?  ?  ? ?Walk 10 feet on uneven surface  ?activity ? ? ?Assist Walk 10 feet on uneven surfaces activity did not occur: Safety/medical concerns ? ? ?  ?   ? ?Wheelchair ? ? ? ? ?Assist Is the patient using a wheelchair?: Yes ?Type of Wheelchair: Manual ?  ? ?Wheelchair assist level: Dependent  - Patient 0% ?Max wheelchair distance: 163ft  ? ? ?Wheelchair 50 feet with 2 turns activity ? ? ? ?Assist ? ?  ?  ? ? ?Assist Level: Dependent - Patient 0%  ? ?Wheelchair 150 feet activity  ? ? ? ?Assist ?   ? ? ?Assist Level: Dependent - Patient 0%  ? ?Blood pressure 126/87, pulse 82, temperature 97.6 ?F (36.4 ?C), resp. rate 16, height 5\' 10"  (1.778 m), weight 106 kg, SpO2 98 %. ? ?Medical Problem List and Plan: ?1. 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 ?            -patient may shower ?            -ELOS/Goals: 14-20d Min/Mod A ? HFU scheduled ? Continue CIR- PT, OT and SLP ? 2 person assist to stand in [place], total A ? -Interdisciplinary Team Conference today   ?2.  Antithrombotics: ?-DVT/anticoagulation:  Pharmaceutical: Lovenox ?            -antiplatelet therapy: ASA/Plavix X 3 weeks followed by ASA alone.  ?3. Shoulder pain: tylenol prn. continue scheduled voltaren gel to QID. Kpad ordered. Discussed stronger pain medications but she defers.  ?4. Anxiety: discussed scheduling hydroxyzine but she defers given side effect profile. Discussed selective serotonin reuptake inhibitors but she refuses due to potential side effects.  ?5. Neuropsych: This patient is capable of making decisions on her own behalf. ?6. Skin/Wound Care: Routine pressure relief measures.  ?7. Fluids/Electrolytes/Nutrition: Monitor I/O. ?8. HTN: Monitor BP TID. Continue Norvasc 10 mg/day, hydralazine 25 mg every 8 hours, and metoprolol 100 mg BID. Supplement klor which will also help with HTN. Increase magnesium gluconate to 500mg  HS. D/c hctz given good control and bump in creatinine ?  ?Vitals:  ? 06/11/21 2134 06/12/21 0429  ?BP: 138/79 126/87  ?Pulse:  82  ?Resp:  16  ?Temp:  97.6 ?F (36.4 ?C)  ?SpO2:  98%  ? ? ? 10. CKD III: Monitor with  routine checks. Recommended drinking 6-8 glasses of water per day. D/c HCTZ given worsening renal function. ?11. E coli UTI: Ceftriaxone started on 04/12 with resolution of leucocytosis.  ?12. Elevated LDL: On high dose Lipitor  ?13. Chronic intermittent cough: Conitnue Mucinex BID ?14. Constipation: Will start miralax bid. Increase magnesium gluconate to 500 mg HS- type 6 BM this am  ?15. Undiagnosed OSA: To be started in sleep study  per husband. ?16. Obesity: BMI 33.56: provide dietary education ?17. Hypokalemia: supplement klor. Repeat BMP today ?18. Right knee pain: XR shows severe patellofemoral arthritis. Lidocaine patch ordered. Apply voltaren gel, improved, continue this regimen ?19. Aphonic: continue SLP. Outpatient ENT follow-up. Discussed with patient. Respiratory muscle training ?20. ?OSA: will need to follow-up with outpatient pulmonologist ?21. Bradycardia:resolved ?22. Fear of falling: provided emotional support.  ?23. Flexor tone, mild, left side. continuekpad.  ?24. Cognitive impairment: continue SLP with emphasis on error awareness.  ?25. Cervical myofascial syndrome: Trigger Point Injections provided some relief on right side, discussed repeating on left but she defers ?26. Loss of bowel or bladder sensation: lumbar XR ordered. ? ?LOS: ?16 days ?A FACE TO FACE EVALUATION WAS PERFORMED ? ?Clint Bolder P Varsha Knock ?06/12/2021, 10:33 AM  ? ?  ?

## 2021-06-12 NOTE — Progress Notes (Signed)
Urinary incontinence, toileted. Peri care completed ?

## 2021-06-12 NOTE — Progress Notes (Signed)
Occupational Therapy Session Note ? ?Patient Details  ?Name: Gwendolyn Morales ?MRN: [id number] ?Date of Birth: 1965/12/19 ? ?Today's Date: 06/12/2021 ?OT Individual Time: 1000-1100 ?OT Individual Time Calculation (min): 60 min  ? ? ?Short Term Goals: ?Week 2:  OT Short Term Goal 1 (Week 2): Pt will complete sliding board transfer with drop arm BSC with mod assist. ?OT Short Term Goal 2 (Week 2): Pt will complete sit<>stand at stedy with max assist +2 achieving full upright stance in prep for ADLs. ?OT Short Term Goal 3 (Week 2): Pt will complete lateral lean pericare with mod assist seated on extra wide commode. ? ?Skilled Therapeutic Interventions/Progress Updates:  ?  Pt received sitting up in the w/c with no c/o pain, agreeable to OT session. She was taken to the therapy gym via w/c. She completed sit > stand in the stedy with max +2 assist. She stayed in the "perched" position and completed 2 sets of 5x blocked practice sit <> stands with max A with very minimal hip clearance. She required max A+2 again to stand from the seat to transfer to sitting EOM. She completed 2 sets of 10 tricep push ups with min facilitation for the LUE to maintain stable weight bearing through extended elbow. Spent the remainder of the session working on forward weight shift to increase offloading of her LB to assist with scooting and standing for transfers. Attempted scooting EOM but pt required max A for initiation. She then worked on functional reaching forward to again increase confidence in forward weight shift. She required max A+2 to stand in the stedy and to transfer back to the w/c. She returned to her room and was left sitting up with all needs met.  ? ? 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 ?Therapy/Group: Individual Therapy ? ?Curtis Sites ?06/12/2021, 6:40 AM ?

## 2021-06-13 LAB — BASIC METABOLIC PANEL
Anion gap: 7 (ref 5–15)
BUN: 21 mg/dL — ABNORMAL HIGH (ref 6–20)
CO2: 25 mmol/L (ref 22–32)
Calcium: 10.3 mg/dL (ref 8.9–10.3)
Chloride: 107 mmol/L (ref 98–111)
Creatinine, Ser: 1.01 mg/dL — ABNORMAL HIGH (ref 0.44–1.00)
GFR, Estimated: >60 mL/min (ref >60)
Glucose, Bld: 97 mg/dL (ref 70–99)
Potassium: 4 mmol/L (ref 3.5–5.1)
Sodium: 139 mmol/L (ref 135–145)

## 2021-06-13 NOTE — Progress Notes (Addendum)
Patient ID: Gwendolyn Morales, female   DOB: 06/26/1965, 55 y.o.   MRN: [id number]  Met with pt and husband who is here for education son was not able to come due to his anxiety. He suffers from this and is somewhat overwhelmed with the care he will need to do for his Mom. Husband is here to learn and will bring him on Sat and Sun. Discussed referral for depends for pt and make referral to [employer] for this. Discussed CAP and PCS services. Will look into this for them via [place]. Asked if they have looked into Medicare since pt has been disabled for five years. Husband will need to call social security to see about this. Pt does not qualify for any disability check due to did not work enough quarters for this. Will work on any resources they are eligible for. Aware of team conference progress and discharge date 5/9. ?

## 2021-06-13 NOTE — Progress Notes (Signed)
Speech Language Pathology Daily Session Note ? ?Patient Details  ?Name: Gwendolyn Morales ?MRN: [id number] ?Date of Birth: 01/14/1966 ? ?Today's Date: 06/13/2021 ?SLP Individual Time: 1430-1500 ?SLP Individual Time Calculation (min): 30 min ? ?Short Term Goals: ?Week 3: SLP Short Term Goal 1 (Week 3): STG's = LTG's due to ELOS ? ?Skilled Therapeutic Interventions: ?Pt seen this date for skilled ST intervention targeting speech intelligibility goals and family education outlined in care plan. Direct hand off from OT. Pt agreeable to ST intervention. ? ?SLP facilitated today's session by providing verbal pt and family education re: rationale and guidelines for use of EMST, use of augmentative communication methods to include use of gestures and writing with sentence and conversational level dialogue, fatigue management/energy conservation, environmental modification, importance of utilizing single word and short phrase verbalizations, and supervision with high-level iADL tasks. Pt and pt's husband verbalized understanding, with pt's husband demonstrating understanding via teach back. Pt completed EMST with Min A. At the single word level, pt was judged to be ~80% intelligible, ~60% of the time given Min A; 2-3 word phrase ~50% of the time given Min A. Continues to attempt self-correction independently at times, though at other times benefits from Min verbal A to verbalize single words vs using head and hand gestures in isolation. All questions answered to pt and spouses satisfaction.  ? ?Pt left in bed with bed alarm on, call bell within reach, and all immediate needs met. Husband at bedside. Continue per current ST POC. ? ?Pain ?Pain in stomach reported; RN made aware ? ?Therapy/Group: Individual Therapy ? ?Bethany A Lutes ?06/13/2021, 3:44 PM ?

## 2021-06-13 NOTE — Progress Notes (Signed)
Occupational Therapy Session Note ? ?Patient Details  ?Name: Gwendolyn Morales ?MRN: [id number] ?Date of Birth: Nov 29, 1965 ? ?Today's Date: 06/13/2021 ?OT Individual Time: 5374-8270 ?OT Individual Time Calculation (min): 30 min  ? ? ?Short Term Goals: ?Week 1:  OT Short Term Goal 1 (Week 1): Pt will complete sit<>stand with max assist in prep for ADLs ?OT Short Term Goal 1 - Progress (Week 1): Progressing toward goal ?OT Short Term Goal 2 (Week 1): Pt will complete LB dressing with mod assist ?OT Short Term Goal 2 - Progress (Week 1): Not progressing ?OT Short Term Goal 3 (Week 1): Pt will complete UB dressing with mod assist. ?OT Short Term Goal 3 - Progress (Week 1): Met ?OT Short Term Goal 4 (Week 1): Pt will complete squat pivot toilet transfer with mod assist. ?OT Short Term Goal 4 - Progress (Week 1): Revised due to lack of progress ? ?Skilled Therapeutic Interventions/Progress Updates:  ?  1:1. Pt received in w/c with husband present. Pt and OT transfer back to bed using sara plus. Pt able to to roll with min-MOD A overall with tactile cuing and OT talking through sequence of steps for toileting at bed level with bed pain. Ot demo bed pan placement and draping for covering peri area for privacy. Husband declines hands on practice stating, "Donnald Garre been helping her roll at night time with nursing and helped her change at bed level once so I feel comfortable with this." Direct handoff to SLP for family ed. ? 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  ?Therapy/Group: Individual Therapy ? ?Gwendolyn Morales ?06/13/2021, 12:32 PM ?

## 2021-06-13 NOTE — Progress Notes (Signed)
Occupational Therapy Session Note ? ?Patient Details  ?Name: Nashiya Disbrow ?MRN: [id number] ?Date of Birth: 04-26-1965 ? ?Today's Date: 06/13/2021 ?OT Individual Time: 7858-8502 ?OT Individual Time Calculation (min): 55 min  ? ? ?Short Term Goals: ?Week 2:  OT Short Term Goal 1 (Week 2): Pt will complete sliding board transfer with drop arm BSC with mod assist. ?OT Short Term Goal 2 (Week 2): Pt will complete sit<>stand at stedy with max assist +2 achieving full upright stance in prep for ADLs. ?OT Short Term Goal 3 (Week 2): Pt will complete lateral lean pericare with mod assist seated on extra wide commode. ? ? ?Skilled Therapeutic Interventions/Progress Updates:  ?  Pt ready to get up, a little more weepy today and reports feeling anxious about son assisting with toileting during family education.  Pt completed supine to sit with HOB elevated and bed rails with min assist and increased time.  Pt attempted sit<>stand at stedy with sheet supporting buttocks and pelvis for manual assist, however pt unable to come to upright stance despite 3 attempts with total assist +2.  Pt reports feeling frustrated and appearing more anxious therefore discontinued.  Noted pt had incontinence of urine therefore returned to supine with max assist +2.  Total assist for bed level toileting with pt needing min assist to roll to left and mod assist to roll to right.  Discussed practicing hoyer lift transfers in next session and she reported receptiveness to trying.  Emotional support and therapeutic use of self utilized throughout session to assist pt in reducing anxieties and coping strategy implementation.  Call bell in reach, bed alarm on. ? 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 ? ? ?Therapy/Group: Individual Therapy ? ?Dian Situ Khadeem Rockett ?06/13/2021, 12:25 PM ?

## 2021-06-13 NOTE — Progress Notes (Signed)
?                                                       PROGRESS NOTE ? ? ?Subjective/Complaints: ?No new complaints this morning ?Doing exercises on her own in bed ?Discussed that low back XR shows arthritis ?No issues reported overnight ? ?ROS:  ?Pt denies SOB, abd pain, CP, N/V/C/D, +bilateral upper back muscle tightness, +right knee pain, +low back pain ? ?Objective: ?  ?DG Lumbar Spine 2-3 Views ? ?Result Date: 06/12/2021 ?CLINICAL DATA:  Low back pain EXAM: LUMBAR SPINE - 2-3 VIEW COMPARISON:  Abdomen 07/13/2015 FINDINGS: Mild anterolisthesis L4-5. Disc space narrowing L2-3, L3-4, L4-5, L5-S1. Diffuse lower lumbar facet degeneration. Negative for fracture or pars defect. Mild levoscoliosis Atherosclerotic aorta without aneurysm. Bilateral renal calculi. Largest calculus left lower pole approximately 25 mm. IMPRESSION: Lumbar disc and facet degeneration.  No acute skeletal abnormality Bilateral renal calculi Electronically Signed   By: Marlan Palau M.D.   On: 06/12/2021 13:35   ?Recent Labs  ?  06/10/21 ?1526  ?WBC 8.4  ?HGB 14.6  ?HCT 44.6  ?PLT 326  ? ? ? ?Recent Labs  ?  06/10/21 ?1526  ?NA 141  ?K 3.9  ?CL 106  ?CO2 28  ?GLUCOSE 98  ?BUN 20  ?CREATININE 1.11*  ?CALCIUM 10.6*  ? ? ? ? ?Intake/Output Summary (Last 24 hours) at 06/13/2021 1101 ?Last data filed at 06/13/2021 [phone] ?Gross per 24 hour  ?Intake 720 ml  ?Output 2 ml  ?Net 718 ml  ?  ? ?  ? ?Physical Exam: ?Vital Signs ?Blood pressure 133/86, pulse 62, temperature 97.9 ?F (36.6 ?C), resp. rate 17, height 5\' 10"  (1.778 m), weight 106 kg, SpO2 96 %. ?Gen: no distress, normal appearing, BMI 33.53 ? ?General: No acute distress ?Mood and affect are appropriate ?Heart: Regular rate and rhythm no rubs murmurs or extra sounds ?Lungs: Clear to auscultation, breathing unlabored, no rales or wheezes ?Abdomen: Positive bowel sounds, soft nontender to palpation, nondistended ?Extremities: No clubbing, cyanosis, or edema ?Skin: minimal bleeding from injection site.   ?Sensory exam normal sensation to light touch and proprioception in bilateral upper and lower extremities ?Hypophonic dysarthria ?Musculoskeletal: Full range of motion in all 4 extremities. No joint swelling. Tight levator scapulae bilaterally ?Right knee TTP, TTP in her left shoulder ? Mental Status: She is alert.  ?   Sensory: Sensory deficit present.  ?   Motor: Weakness (mild left sided weakness after therapy in 2017--worse now) present.  ?   Comments: Left facial paresis with severe dysarthria with low voice volume--tends to keep mouth open. 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  ?Total A x2 for standingImproving speech intelligibility ?Psych: bright smile, +anxiety ? ? ? ?Assessment/Plan: ?1. Functional deficits which require 3+ hours per day of interdisciplinary therapy in a comprehensive inpatient rehab setting. ?Physiatrist is providing close team supervision and 24 hour management of active medical problems listed below. ?Physiatrist and rehab team continue to assess barriers to discharge/monitor patient progress toward functional and medical goals ? ?Care Tool: ? ?Bathing ?   ?   ? Body parts bathed by helper: Buttocks, Front perineal area ?  ?  ?Bathing assist Assist Level: Dependent - Patient 0% ?  ?  ?Upper Body Dressing/Undressing ?Upper body dressing   ?What is the patient  wearing?: Hospital gown only ?   ?Upper body assist Assist Level: Dependent - Patient 0% ?   ?Lower Body Dressing/Undressing ?Lower body dressing ? ? ?   ?What is the patient wearing?: Hospital gown only ? ?  ? ?Lower body assist Assist for lower body dressing: Dependent - Patient 0% ?   ? ?Toileting ?Toileting    ?Toileting assist Assist for toileting: Dependent - Patient 0% ?  ?  ?Transfers ?Chair/bed transfer ? ?Transfers assist ?   ? ?Chair/bed transfer assist level: Dependent - mechanical lift [facility]) ?  ?  ?Locomotion ?Ambulation ? ? ?Ambulation assist ? ? Ambulation activity did not occur: Safety/medical  concerns ? ?  ?  ?   ? ?Walk 10 feet activity ? ? ?Assist ? Walk 10 feet activity did not occur: Safety/medical concerns ? ?  ?   ? ?Walk 50 feet activity ? ? ?Assist Walk 50 feet with 2 turns activity did not occur: Safety/medical concerns ? ?  ?   ? ? ?Walk 150 feet activity ? ? ?Assist Walk 150 feet activity did not occur: Safety/medical concerns ? ?  ?  ?  ? ?Walk 10 feet on uneven surface  ?activity ? ? ?Assist Walk 10 feet on uneven surfaces activity did not occur: Safety/medical concerns ? ? ?  ?   ? ?Wheelchair ? ? ? ? ?Assist Is the patient using a wheelchair?: Yes ?Type of Wheelchair: Manual ?  ? ?Wheelchair assist level: Dependent - Patient 0% ?Max wheelchair distance: 156ft  ? ? ?Wheelchair 50 feet with 2 turns activity ? ? ? ?Assist ? ?  ?  ? ? ?Assist Level: Dependent - Patient 0%  ? ?Wheelchair 150 feet activity  ? ? ? ?Assist ?   ? ? ?Assist Level: Dependent - Patient 0%  ? ?Blood pressure 133/86, pulse 62, temperature 97.9 ?F (36.6 ?C), resp. rate 17, height 5\' 10"  (1.778 m), weight 106 kg, SpO2 96 %. ? ?Medical Problem List and Plan: ?1. 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 ?            -patient may shower ?            -ELOS/Goals: 14-20d Min/Mod A ? HFU scheduled ? Continue CIR- PT, OT and SLP ? 2 person assist to stand in [place], total A ?2.  Antithrombotics: ?-DVT/anticoagulation:  Pharmaceutical: Lovenox ?            -antiplatelet therapy: ASA/Plavix X 3 weeks followed by ASA alone.  ?3. Shoulder pain: tylenol prn. continuescheduled voltaren gel to QID. Kpad ordered. Discussed stronger pain medications but she defers.  ?4. Anxiety: discussed scheduling hydroxyzine but she defers given side effect profile. Discussed selective serotonin reuptake inhibitors but she refuses due to potential side  effects.  ?5. Neuropsych: This patient is capable of making decisions on her own behalf. ?6. Skin/Wound Care: Routine pressure relief measures.  ?7. Fluids/Electrolytes/Nutrition: Monitor I/O. ?8. HTN: Monitor BP TID. Well controlled, continue Norvasc 10 mg/day, hydralazine 25 mg every 8 hours, and metoprolol 100 mg BID. Supplement klor which will also help with HTN. Increase magnesium gluconate to 500mg  HS. D/c hctz given good control and bump in creatinine ?  ?Vitals:  ? 06/12/21 1932 06/13/21 0304  ?BP: 130/82 133/86  ?Pulse: 77 62  ?Resp: 16 17  ?Temp: 98.1 ?F (36.7 ?  C) 97.9 ?F (36.6 ?C)  ?SpO2: 96% 96%  ? ? ? 10. CKD III: Monitor with routine checks. Recommended drinking 6-8 glasses of water per day. D/c HCTZ given worsening renal function. ?11. E coli UTI: Ceftriaxone started on 04/12 with resolution of leucocytosis.  ?12. Elevated LDL: On high dose Lipitor  ?13. Chronic intermittent cough: Conitnue Mucinex BID ?14. Constipation: Will start miralax bid. Increase magnesium gluconate to 500 mg HS- type 6 BM this am  ?15. Undiagnosed OSA: To be started in sleep study per husband. ?16. Obesity: BMI 33.56: provide dietary education ?17. Hypokalemia: supplement klor. Repeat BMP today ?18. Right knee pain: XR shows severe patellofemoral arthritis. Lidocaine patch ordered. Apply voltaren gel, improved, continue this regimen ?19. Aphonic: continue SLP. Outpatient ENT follow-up. Discussed with patient. Respiratory muscle training ?20. ?OSA: will need to follow-up with outpatient pulmonologist ?21. Bradycardia:resolved ?22. Fear of falling: provided emotional support.  ?23. Flexor tone, mild, left side. continuekpad.  ?24. Cognitive impairment: continue SLP with emphasis on error awareness.  ?25. Cervical myofascial syndrome: Trigger Point Injections provided some relief on right side, discussed repeating on left but she defers ?26. Loss of bowel or bladder sensation: lumbar XR ordered- discussed that it shows facet  degeneration, no nerve root compression ? ?LOS: ?17 days ?A FACE TO FACE EVALUATION WAS PERFORMED ? ?Clint BolderKrutika P Dat Derksen ?06/13/2021, 11:01 AM  ? ?  ?

## 2021-06-13 NOTE — Progress Notes (Signed)
Physical Therapy Session Note ? ?Patient Details  ?Name: Gwendolyn Morales ?MRN: [id number] ?Date of Birth: 20-Feb-1965 ? ?Today's Date: 06/13/2021 ?PT Individual Time: 0175-1025 ?PT Individual Time Calculation (min): 80 min  ? ?Short Term Goals: ?Week 3:  PT Short Term Goal 1 (Week 3): STG = LTG due to ELOS ? ?Skilled Therapeutic Interventions/Progress Updates:  ?  Pt received supine in bed with her husband, [name], present for hands-on family education/training and pt agreeable to therapy session. Pt reports she may need to be changed. Pt's husband demonstrating carry over of education learned during OT session on how to assist pt with rolling R/L in bed using bedrails - therapist assisting with rolling towards R (max assist) and pt's husband provided assist to roll L (mod assist) during dependent assist LB clothing management - pt brief is clean. Pt's husband reports he knows the recommendation is to assess pt for incontinence every ~2 hours and he says at home he can likely perform it every ~4 hours. ? ?Pt, pt's husband, and therapist discussed the following regarding D/C planning:  ?- pt's husband works full time 8hour shifts with plan to assist pt with all hygiene and dressing tasks in AM before leaving for work, returning home during his lunch break to assist pt with these tasks again, then will be with her the remainder of evening after work  ?- pt's son will be present with pt while pt's husband at work; however, at this time he does not feel comfortable assisting pt with personal hygiene - pt's son planning to attend hands-on training/education this weekend ?- discussed need for hospital bed at home to allow family to safely assist pt with bed level ADLs as well as allow hoyer lift transfers ?- pt's husband confirms 1 story home with ramped entrance ?- home doors are all 32inches, except bathroom which is 28inches ?- pt's husband has a Engineer, petroleum - therapist educated that pt will likely have to D/C home via  ambulance transport ?- discussed pt's CLOF with recommendation to perform hoyer lift transfers between bed and wheelchair- pt has hard floor in the home, which will allow hoyer to be moved on it easily ?- pt's current wheelchair is >87years old and pt will benefit from a new custom wheelchair consultation/evaluation to discuss option of manual tilt vs power - pt will require tilt feature for pressure relief as pt currently has no other safe pressure relieving technique ? ?Therapist educated pt's husband on how to safely use manual hoyer lift and how to place sling underneath patient while in the bed compared to when sitting upright in a chair. Therapist educated pt's husband on use and management of hoyer lift parts. Pt rolling in bed as described above during placement of sling - pt's husband assist with placing sling and connecting it to hoyer lift. Performed dependent manual hoyer lift transfer from bed>w/c - of note, pt's but unable to fully clear the bed or wheelchair seat because the hoyer lift does not raise any higher (may need smaller sling than one currently using). Pt's husband providing the +2 assist for the transfer but not driving/managing the hoyer lift himself. Educated on not removing sling until ensuring pt in a safe position in the wheelchair - pt does require total assist to reposition in chair using sling - educated on importance of removing sling while up in chair to prevent skin breakdown. ? ?Once in w/c transported pt and husband to/from wheelchair room to educate them on differences in power wheelchair  vs manual wheelchair with tilt. Recommended primary PT determine most appropriate wheelchair recommendation - pt open to trying to drive power wheelchair tomorrow if primary PT felt this is appropriate with pt's cognition and motor response time. ? ?Transported back to room and performed manual hoyer lift transfer w/c>bed with pt's husband again providing +2 assistance to ensure safe pt  positioning in the sling and turning to lay in the bed - lowered pt quickly onto the bed by accident of turning nob to lower hoyer too quickly, but no pain or injury occurred - educated pt's husband that is why always ensuring pt in a safe position with everything set-up prior to starting to lower patient. Removed hoyer sling as above. ? ?Pt and husband understanding that manual hoyer lift transfer is the safest option at this time. Pt left supine in bed with needs in reach, bed alarm on, and her husband present. ? ? 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 ? ?Pain: ?No specific complaints of pain during session. Nurse arrived to apply lidocaine patch to R knee during session. ? ? ?Therapy/Group: Individual Therapy ? ?Ginny Forth , PT, DPT, NCS, CSRS ?06/13/2021, 12:24 PM  ?

## 2021-06-13 NOTE — Progress Notes (Signed)
Recreational Therapy Session Note ? ?Patient Details  ?Name: Gwendolyn Morales ?MRN: [id number] ?Date of Birth: January 05, 1966 ?Today's Date: 06/13/2021 ? ?Pain: no c/o ?Skilled Therapeutic Interventions/Progress Updates: Session focused on discharge planning, use of leisure time, activity modifications/set up and importance of emotional health as she transitions home.  Pt stated feeling nervous about discharge but glad to be going home.  Pt completes simple TR tasks seated with set up assistance. ? ?Therapy/Group: Individual Therapy ? ?Nasim Habeeb ?06/13/2021, 8:56 AM  ?

## 2021-06-13 NOTE — Progress Notes (Signed)
Physical Therapy Session Note ? ?Patient Details  ?Name: Gwendolyn Morales ?MRN: [id number] ?Date of Birth: 1965-02-11 ? ?Today's Date: 06/13/2021 ?PT Individual Time: 1655-3748 ?PT Individual Time Calculation (min): 30 min  ? ?Short Term Goals: ?Week 3:  PT Short Term Goal 1 (Week 3): STG = LTG due to ELOS ? ?Skilled Therapeutic Interventions/Progress Updates:  ?   ?Pt supine in bed to start - agreeable to PT tx. Denies pain.  ? ?Focused session on introducing hoyer lift transfers as this is the anticipated method for family to safely transfer patient at home. Showed her a short 2 minute video of an example of the hoyer lift transfer to allow her to better understand purpose, process, and ease anxiety regarding transfer. Retrieved Manual Hoyer lift and pt rolled in bed to don. Rolling to her L with minA and use of bed rail and requiring maxA to roll R with difficulty achieving full sidelying position.  ? ?Completed dependant Hoyer lift transfer from bed to w/c - speaking through steps for patient to understand and to eventually direct care. Pt somewhat anxious regarding transfer but nerves calmed after completion. Left Hoyer Lift in room for family training this PM. Pt with all needs met, call bell in reach at end of session.  ? 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  ? ?Therapy/Group: Individual Therapy ? ?[place] Spees P Walther [place] ?06/13/2021, 7:29 AM  ?

## 2021-06-14 DIAGNOSIS — F4323 Adjustment disorder with mixed anxiety and depressed mood: Secondary | ICD-10-CM

## 2021-06-14 DIAGNOSIS — K592 Neurogenic bowel, not elsewhere classified: Secondary | ICD-10-CM

## 2021-06-14 DIAGNOSIS — M25511 Pain in right shoulder: Secondary | ICD-10-CM

## 2021-06-14 DIAGNOSIS — M25512 Pain in left shoulder: Secondary | ICD-10-CM

## 2021-06-14 DIAGNOSIS — I10 Essential (primary) hypertension: Secondary | ICD-10-CM

## 2021-06-14 NOTE — Discharge Summary (Signed)
Physician Discharge Summary  ?Patient ID: ?Mirai Fata ?MRN: [id number] ?DOB/AGE: 1965/03/21 56 y.o. ? ?Admit date: 05/27/2021 ?Discharge date: 06/18/2021 ? ?Discharge Diagnoses:  ?Principal Problem: ?  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  Stroke (cerebrum) ([place]) ?DVT prophylaxis ?Hypertension ?Mood stabilization ?CKD stage III ?E. coli UTI ?Constipation ?Obesity ?Cervical myofascial syndrome ?History of left parietal ICH ? ?Discharged Condition: Stable ? ?Significant Diagnostic Studies: ?DG Lumbar Spine 2-3 Views ? ?Result Date: 06/12/2021 ?CLINICAL DATA:  Low back pain EXAM: LUMBAR SPINE - 2-3 VIEW COMPARISON:  Abdomen 07/13/2015 FINDINGS: Mild anterolisthesis L4-5. Disc space narrowing L2-3, L3-4, L4-5, L5-S1. Diffuse lower lumbar facet degeneration. Negative for fracture or pars defect. Mild levoscoliosis Atherosclerotic aorta without aneurysm. Bilateral renal calculi. Largest calculus left lower pole approximately 25 mm. IMPRESSION: Lumbar disc and facet degeneration.  No acute skeletal abnormality Bilateral renal calculi Electronically Signed   By: Franchot Gallo M.D.   On: 06/12/2021 13:35  ? ?DG Knee 1-2 Views Right ? ?Result Date: 05/29/2021 ?CLINICAL DATA:  Right knee pain. EXAM: RIGHT KNEE - 1-2 VIEW COMPARISON:  None. FINDINGS: Severe patellofemoral joint space narrowing and peripheral degenerative osteophytes. Small joint effusion. Minimal medial and lateral compartment joint space narrowing and peripheral osteophytosis. No acute fracture or dislocation. IMPRESSION: Severe patellofemoral osteoarthritis. Electronically Signed   By: Yvonne Kendall M.D.   On: 05/29/2021 09:00  ? ?MR BRAIN WO CONTRAST ? ?Result Date: 05/21/2021 ?CLINICAL DATA:  Neuro deficit, acute, stroke suspected. Acute presentation with left-sided weakness. EXAM: MRI HEAD WITHOUT CONTRAST TECHNIQUE: Multiplanar, multiecho pulse sequences of the brain and surrounding structures were obtained without intravenous contrast.  COMPARISON:  CT studies earlier same day.  MRI 08/06/2016. FINDINGS: Brain: Extensive old small vessel infarctions are seen affecting the pons. No focal cerebellar insult. There is an old infarction in the right PCA territory affecting the posteromedial temporal lobe and occipital lobe. This could be late subacute. Old small vessel infarctions are seen affecting the thalami I and throughout the cerebral hemispheric white matter. Foci of restricted diffusion are noted within the deep white matter adjacent to the posterior body of the right lateral ventricle, the anterior body of the right lateral ventricle in the posterior body of the left lateral ventricle. Few punctate foci of hemosiderin deposition present within the brain associated with some of the old infarctions. No mass, hydrocephalus or extra-axial collection. Vascular: Major vessels at the base of the brain show flow. Skull and upper cervical spine: Negative Sinuses/Orbits: Clear/normal Other: None IMPRESSION: Advanced chronic small-vessel ischemic changes throughout the brain. Acute small-vessel deep white matter infarctions adjacent to the posterior body of the right lateral ventricle, the anterior body of the right lateral ventricle in the posterior body of the left lateral ventricle. Old right frontal operculum stroke. Late subacute to old infarction of the right posteromedial temporal lobe and occipital lobe. Electronically Signed   By: Nelson Chimes M.D.   On: 05/21/2021 10:13  ? ?DG CHEST PORT 1 VIEW ? ?Result Date: 05/21/2021 ?CLINICAL DATA:  CVA. EXAM: PORTABLE CHEST 1 VIEW COMPARISON:  07/23/2015 FINDINGS: The cardiac silhouette, mediastinal and hilar contours are within normal limits given the AP projection and portable technique. No acute pulmonary findings. No pulmonary lesions or pneumothorax. The bony thorax is intact. IMPRESSION: No acute cardiopulmonary findings.  He Electronically Signed   By: Marijo Sanes M.D.   On: 05/21/2021 18:46   ? ?VAS [place] TRANSCRANIAL DOPPLER W BUBBLES ? ?Result Date: 05/30/2021 ? Transcranial Doppler with Bubble Patient Name:  [facility]  Inda  Date of Exam:   05/23/2021 Medical Rec #: [id number]        Accession #:    [id number] Date of Birth: 01-18-1966        Patient Gender: F Patient Age:   66 years Exam Location:  [facility] Procedure:      VAS [place] TRANSCRANIAL DOPPLER W BUBBLES Referring Phys: Janine Ores --------------------------------------------------------------------------------  Indications: Stroke. Performing Technologist: Darlin Coco RDMS, RVT  Examination Guidelines: A complete evaluation includes B-mode imaging, spectral Doppler, color Doppler, and power Doppler as needed of all accessible portions of each vessel. Bilateral testing is considered an integral part of a complete examination. Limited examinations for reoccurring indications may be performed as noted.  Summary:  A vascular evaluation was performed. The left middle cerebral artery was studied. An IV was inserted into the patient's left forearm. Verbal informed consent was obtained.  Between 16 and 50 high intensity transient signals (HITS) were observed at rest and with valsalva, indicating a Spencer grade 3 (moderate) patent foramen ovale (PFO). Positive TCD Bubble study indicative of a moderate size right to left shunt *See table(s) above for TCD measurements and observations.  Diagnosing physician: Antony Contras MD Electronically signed by Antony Contras MD on 05/30/2021 at 8:51:08 AM.    Final   ? ?ECHO TEE ? ?Result Date: 05/24/2021 ?   TRANSESOPHOGEAL ECHO REPORT   Patient Name:   DONYAE ROHRBAUGH Date of Exam: 05/24/2021 Medical Rec #:  [id number]       Height:       70.0 in Accession #:    [id number]      Weight:       245.6 lb Date of Birth:  [birth year]       BSA:          2.278 m? Patient Age:    53 years        BP:           132/72 mmHg Patient Gender: F               HR:           104 bpm. Exam Location:  Inpatient Procedure:  Transesophageal Echo, Color Doppler, 3D Echo and Cardiac Doppler Indications:     mitral valve mass  History:         Patient has prior history of Echocardiogram examinations, most                  recent 05/21/2021. Stroke and chronic kidney disease; Risk                  Factors:Hypertension and Sleep Apnea.  Sonographer:     Johny Chess RDCS Referring Phys:  Nancy Fetter DUNN Diagnosing Phys: Fransico Him MD PROCEDURE: After discussion of the risks and benefits of a TEE, an informed consent was obtained from the patient. The transesophogeal probe was passed without difficulty through the esophogus of the patient. Imaged were obtained with the patient in a left lateral decubitus position. Sedation performed by different physician. The patient was monitored while under deep sedation. Anesthestetic sedation was provided intravenously by Anesthesiology: 245mg  of Propofol, 40mg  of Lidocaine. The patient's vital signs; including heart rate, blood pressure, and oxygen saturation; remained stable throughout the procedure. The patient developed no complications during the procedure. IMPRESSIONS  1. Left ventricular ejection fraction, by estimation, is 60 to 65%. The left ventricle has normal function. The left ventricle has no regional wall motion abnormalities.  2. Right ventricular systolic function is normal. The right ventricular size is normal.  3. No left atrial/left atrial appendage thrombus was detected.  4. Calcified posterior leaflet which is fixed and immobile. No vegetation. The mitral valve is degenerative. Trivial mitral valve regurgitation. No evidence of mitral stenosis.  5. The aortic valve is tricuspid. Aortic valve regurgitation is not visualized. Aortic valve sclerosis is present, with no evidence of aortic valve stenosis.  6. There is mild (Grade II) layered plaque.  7. The inferior vena cava is normal in size with greater than 50% respiratory variability, suggesting right atrial pressure of 3  mmHg.  8. Agitated saline contrast bubble study was negative, with no evidence of any interatrial shunt. Conclusion(s)/Recommendation(s): Normal biventricular function without evidence of hemodynamically significant valvular he

## 2021-06-14 NOTE — Consult Note (Signed)
Neuropsychological Consultation ? ? ?Patient:   Gwendolyn Morales  ? ?DOB:   06-Jan-1966 ? ?MR Number:  [id number] ? ?Location:  [place] [facility] [address] [place] ?[id number] [facility] ?[place] [address] ?Dept: [phone] ?LocSR:[id number] ?          ?Date of Service:   06/14/2021 ? ?Start Time:   8 AM ?End Time:   9 AM ? ?Provider/Observer:  Gwendolyn Morales, Psy.D.   ?    Clinical Neuropsychologist ?     ? ?Billing Code/Service: [id number] ? ?Chief Complaint:    Gwendolyn Morales is a 56 year old female with a medical history including malignant hypertension and left parietal ICH 2017 patient received inpatient rehab services between 07/24/2015 - 08/17/2015 and continued to have some mild left-sided weakness afterwards.  Patient presented on 06/01/2021 after awakening with acute left-sided weakness.  Blood pressure elevated at admission and MRI brain showed advanced small vessel disease with acute small vessel deep white matter infarcts and anterior and posterior right lateral ventricle region and posterior body of the left internal ventricle.  Neurology felt that stroke was likely due to secondary cryptogenic versus vasculitis source.  Patient continued to have significant left-sided weakness with flexor tone and sensory deficits, generalized right-sided weakness, dysarthria.  Patient with significant pre-gait difficulties.  Patient recommended for CIR due to functional decline. ? ?Reason for Service:  Patient referred for neuropsychological consultation due to adjustment with significant emotional response to most recent stroke and significant loss of function.  Patient with significant dysarthria but able to effectively right as a form of communication and speech is very low volume and dysarthric.  Below is the HPI for the current admission. ? ?HPI: Gwendolyn Morales is a 56 year old RH-female with history of malignant HTN, left parietal ICH 2017 and received inpatient  rehab services 07/24/2015 - 08/17/2015, mild left sided weakness.  Per chart review patient lives with spouse and family.  1 level home with ramped entrance.  Family did assist with some ADLs.  Presented 06/01/21 after awaking with left sided weakness of acute onset. BP elevated at admission and CTA head/neck was negative for LVO and showed 50% stenosis of L-VA and 30$ stenosis B-VA V4 segment. MRI brain showed advanced small vessel disease with acute small vessel deep white matter infarcts in anterior and posterior right lateral ventricle and posterior body of left lateral ventricle. TCD showed moderate size right to left shunt. TEE showed normal LVH, was negative for thrombus and showed severe mitral valve calcification with trivial AV sclerosis and mild grade 2 atherosclerotic plaque in thoracic and ascending aorta. BLE dopplers were negative for DVT.  Maintained on Lovenox for DVT prophylaxis. ?  ?Gwendolyn Morales felt that stroke likely due to secondary cryptogenic v/s vasculitis source and recommends DAPT X 3 weeks followed by ASA alone. Patient continues to be limited by left sided weakness with flexor tone and sensory deficits, generalized right sided weakness, dysarthria and ,working on pre-gait activity. CIR recommended due to functional decline. ? ?Current Status:  Patient was awake and alert and oriented sitting upright in her bed.  She maintained a pleasant and friendly interactive style throughout visit although she was visibly upset when talking about her loss of function and difficulty with communication and motor functioning.  Verbalized understanding explanations and communications with therapist and that they have been very encouraging to her throughout her CIR.  Patient has an expected discharge around 5/9.  She acknowledges significant anxiety  about how she will function post discharge but remains motivated to actively work on therapeutic interventions.  Patient's memory and general fund of information  appear to be intact with primary deficits related to motor functioning and expressive language primarily due to motor deficits and sensorimotor deficits. ? ? ?Behavioral Observation: Gwendolyn Morales  presents as a 56 y.o.-year-old Right Caucasian Female who appeared her stated age. her dress was Appropriate and she was Well Groomed and her manners were Appropriate to the situation.  her participation was indicative of Appropriate behaviors.  There were physical disabilities noted.  she displayed an appropriate level of cooperation and motivation.   ? ? ?Interactions:    Active Appropriate and Redirectable ? ?Attention:   abnormal and attention span appeared shorter than expected for age ? ?Memory:   within normal limits; recent and remote memory intact ? ?Visuo-spatial:  not examined ? ?Speech (Volume):  low ? ?Speech:   slurred; garbled ? ?Thought Process:  Coherent and Relevant ? ?Though Content:  WNL; not suicidal and not homicidal ? ?Orientation:   person, place, and situation ? ?Judgment:   Fair ? ?Planning:   Poor ? ?Affect:    Tearful ? ?Mood:    Dysphoric ? ?Insight:   Good ? ?Intelligence:   normal ? ? ?Medical History:   ?Past Medical History:  ?Diagnosis Date  ? Hypertension   ? Stroke [facility]) 07/2015  ? ICH with residual hemiparesis  ? ?     ?Patient Active Problem List  ? Diagnosis Date Noted  ? 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 Stroke (cerebrum) ([place]) 05/27/2021  ? E-coli UTI 05/23/2021  ? CVA (cerebral vascular accident) ([place]) 05/21/2021  ? Stage 3a chronic kidney disease (CKD) ([place]) 05/21/2021  ? History of CVA with residual deficit 05/21/2021  ? History of CVA with residual right-sided weakness gait disturbance, post-stroke 08/28/2015  ? Hemiparesis as late effect of cerebrovascular accident (CVA) ([place]) 08/28/2015  ? Benign essential HTN   ? Adjustment disorder with depressed mood   ? Cerebral parenchymal hemorrhage ([place]) 07/24/2015  ? Right-sided nontraumatic intracerebral hemorrhage of  brainstem ([place])   ? Elevated bilirubin   ? Adjustment disorder with mixed anxiety and depressed mood   ? Diastolic dysfunction   ? Obesity (BMI 30-39.9)   ? H/O noncompliance with medical treatment, presenting hazards to health   ? Tachypnea   ? Acute blood loss anemia   ? Dysphagia, post-stroke   ? HTN (hypertension)   ? Productive cough   ? Tracheostomy status ([place])   ? Nontraumatic cortical hemorrhage of left cerebral hemisphere [facility])   ? Hypokalemia   ? Azotemia   ? Acute respiratory failure ([place])   ? Acute respiratory failure with hypoxemia ([facility])   ? Cytotoxic brain edema ([place]) 07/13/2015  ? Hypertensive emergency 07/13/2015  ? ICH (intracerebral hemorrhage) ([place]) 07/12/2015  ? ? ?Psychiatric History:  No prior psychiatric history ? ?Family Med/Psych History:  ?Family History  ?Problem Relation Age of Onset  ? Diabetes Mother   ? Hypertension Sister   ? ? ?Impression/DX:  Gwendolyn Morales is a 56 year old female with a medical history including malignant hypertension and left parietal ICH 2017 patient received inpatient rehab services between 07/24/2015 - 08/17/2015 and continued to have some mild left-sided weakness afterwards.  Patient presented on 06/01/2021 after awakening with acute left-sided weakness.  Blood pressure elevated at admission and MRI brain showed advanced small vessel disease with acute small vessel deep white matter infarcts and anterior and posterior right  lateral ventricle region and posterior body of the left internal ventricle.  Neurology felt that stroke was likely due to secondary cryptogenic versus vasculitis source.  Patient continued to have significant left-sided weakness with flexor tone and sensory deficits, generalized right-sided weakness, dysarthria.  Patient with significant pre-gait difficulties.  Patient recommended for CIR due to functional decline. ? ?Patient was awake and alert and oriented sitting upright in her bed.  She maintained a pleasant and friendly interactive style  throughout visit although she was visibly upset when talking about her loss of function and difficulty with communication and motor functioning.  Verbalized understanding explanations and communications w

## 2021-06-14 NOTE — Progress Notes (Signed)
Speech Language Pathology Daily Session Note ? ?Patient Details  ?Name: Gwendolyn Morales ?MRN: [id number] ?Date of Birth: [birth year] ? ?Today's Date: 06/14/2021 ?SLP Individual Time: 8366-2947 ?SLP Individual Time Calculation (min): 45 min ? ?Short Term Goals: ?Week 3: SLP Short Term Goal 1 (Week 3): STG's = LTG's due to ELOS ? ?Skilled Therapeutic Interventions: ?Pt seen this date for skilled ST intervention targeting cognitive-linguistic goals outlined in care plan. Direct hand off from OT. Agreeable to ST intervention in therapy office. ? ?SLP facilitated today's session by providing overall Sup A for 100% accuracy during completion of pill organizer with 3 medications. Benefited from [place] verbal A to orient to pill box prior to placing medications. Utilized medication list to recall dosage of medication  prior to placement with Mod I. 100% accuracy when completing verbal problem-solving re: medication safety, label interpretation  and administration with Mod I.  ? ?Speech intelligibility, at the word and short phrase level, was subjectively noted to be 65-70% with Mod I to a familiar listener, and independent self-correction and use of augmentative communication methods (writing and gesture use). Pt continues to demonstrate steady gains in all targeted ST domains.  ? ?Pt left in w/c, safety belt donned, call bell within reach, and all immediate needs met. Continue per current ST POC. ? ?Pain ?Denies pain during this session; NAD ? ?Therapy/Group: Individual Therapy ? ?Braelin Brosch A Estelle Greenleaf ?06/14/2021, 10:48 AM ?

## 2021-06-14 NOTE — Progress Notes (Signed)
?                                                       PROGRESS NOTE ? ? ?Subjective/Complaints: ?Sleepy this morning ?Worked with neuropsych ?Hoyer lift has been ordered ?Still with anxiety, using relaxation techniques ? ?ROS:  ?Pt denies SOB, abd pain, CP, N/V/C/D, +bilateral upper back muscle tightness, +right knee pain, +low back pain, +anxiety ? ?Objective: ?  ?DG Lumbar Spine 2-3 Views ? ?Result Date: 06/12/2021 ?CLINICAL DATA:  Low back pain EXAM: LUMBAR SPINE - 2-3 VIEW COMPARISON:  Abdomen 07/13/2015 FINDINGS: Mild anterolisthesis L4-5. Disc space narrowing L2-3, L3-4, L4-5, L5-S1. Diffuse lower lumbar facet degeneration. Negative for fracture or pars defect. Mild levoscoliosis Atherosclerotic aorta without aneurysm. Bilateral renal calculi. Largest calculus left lower pole approximately 25 mm. IMPRESSION: Lumbar disc and facet degeneration.  No acute skeletal abnormality Bilateral renal calculi Electronically Signed   By: Franchot Gallo M.D.   On: 06/12/2021 13:35   ?No results for input(s): WBC, HGB, HCT, PLT in the last 72 hours. ? ? ? ?Recent Labs  ?  06/13/21 ?1129  ?NA 139  ?K 4.0  ?CL 107  ?CO2 25  ?GLUCOSE 97  ?BUN 21*  ?CREATININE 1.01*  ?CALCIUM 10.3  ? ? ? ? ?Intake/Output Summary (Last 24 hours) at 06/14/2021 1109 ?Last data filed at 06/14/2021 0700 ?Gross per 24 hour  ?Intake 820 ml  ?Output --  ?Net 820 ml  ?  ? ?  ? ?Physical Exam: ?Vital Signs ?Blood pressure 130/81, pulse 60, temperature 97.9 ?F (36.6 ?C), resp. rate 18, height 5\' 10"  (1.778 m), weight 106 kg, SpO2 96 %. ?Gen: no distress, normal appearing, BMI 33.53 ? ?General: No acute distress ?Mood and affect are appropriate ?Heart: Regular rate and rhythm no rubs murmurs or extra sounds ?Lungs: Clear to auscultation, breathing unlabored, no rales or wheezes ?Abdomen: Positive bowel sounds, soft nontender to palpation, nondistended ?Extremities: No clubbing, cyanosis, or edema ?Skin: minimal bleeding from injection site.  ?Sensory exam  normal sensation to light touch and proprioception in bilateral upper and lower extremities ?Hypophonic dysarthria ?Musculoskeletal: Full range of motion in all 4 extremities. No joint swelling. Tight levator scapulae bilaterally ?Right knee TTP, TTP in her left shoulder ? Mental Status: She is alert.  ?   Sensory: Sensory deficit present.  ?   Motor: Weakness (mild left sided weakness after therapy in 2017--worse now) present.  ?   Comments: Left facial paresis with severe dysarthria with low voice volume--tends to keep mouth open. 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  ?Total A x2 for standingImproving speech intelligibility ?Psych: bright smile, +situational anxiety ? ? ? ?Assessment/Plan: ?1. Functional deficits which require 3+ hours per day of interdisciplinary therapy in a comprehensive inpatient rehab setting. ?Physiatrist is providing close team supervision and 24 hour management of active medical problems listed below. ?Physiatrist and rehab team continue to assess barriers to discharge/monitor patient progress toward functional and medical goals ? ?Care Tool: ? ?Bathing ?   ?   ? Body parts bathed by helper: Buttocks, Front perineal area ?  ?  ?Bathing assist Assist Level: Dependent - Patient 0% ?  ?  ?Upper Body Dressing/Undressing ?Upper body dressing   ?What is the patient wearing?: [place] only ?   ?Upper body assist Assist Level:  Dependent - Patient 0% ?   ?Lower Body Dressing/Undressing ?Lower body dressing ? ? ?   ?What is the patient wearing?: [place] only ? ?  ? ?Lower body assist Assist for lower body dressing: Dependent - Patient 0% ?   ? ?Toileting ?Toileting    ?Toileting assist Assist for toileting: Dependent - Patient 0% ?  ?  ?Transfers ?Chair/bed transfer ? ?Transfers assist ?   ? ?Chair/bed transfer assist level: Dependent - mechanical lift Clarise [facility] Plus) ?  ?  ?Locomotion ?Ambulation ? ? ?Ambulation assist ? ? Ambulation activity did not occur: Safety/medical  concerns ? ?  ?  ?   ? ?Walk 10 feet activity ? ? ?Assist ? Walk 10 feet activity did not occur: Safety/medical concerns ? ?  ?   ? ?Walk 50 feet activity ? ? ?Assist Walk 50 feet with 2 turns activity did not occur: Safety/medical concerns ? ?  ?   ? ? ?Walk 150 feet activity ? ? ?Assist Walk 150 feet activity did not occur: Safety/medical concerns ? ?  ?  ?  ? ?Walk 10 feet on uneven surface  ?activity ? ? ?Assist Walk 10 feet on uneven surfaces activity did not occur: Safety/medical concerns ? ? ?  ?   ? ?Wheelchair ? ? ? ? ?Assist Is the patient using a wheelchair?: Yes ?Type of Wheelchair: Manual ?  ? ?Wheelchair assist level: Dependent - Patient 0% ?Max wheelchair distance: 166ft  ? ? ?Wheelchair 50 feet with 2 turns activity ? ? ? ?Assist ? ?  ?  ? ? ?Assist Level: Dependent - Patient 0%  ? ?Wheelchair 150 feet activity  ? ? ? ?Assist ?   ? ? ?Assist Level: Dependent - Patient 0%  ? ?Blood pressure 130/81, pulse 60, temperature 97.9 ?F (36.6 ?C), resp. rate 18, height 5\' 10"  (1.778 m), weight 106 kg, SpO2 96 %. ? ?Medical Problem List and Plan: ?1. 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 ?            -patient may shower ?            -ELOS/Goals: 14-20d Min/Mod A ? HFU scheduled ? Continue CIR- PT, OT and SLP ? 2 person assist to stand in [place], total A ? Hoyer lift ordered ?2.  Antithrombotics: ?-DVT/anticoagulation:  Pharmaceutical: Lovenox ?            -antiplatelet therapy: ASA/Plavix X 3 weeks followed by ASA alone.  ?3. Shoulder pain: continue tylenol prn. continuescheduled voltaren gel to QID. Kpad ordered. Discussed stronger pain medications but she defers.  ?4. Anxiety: discussed scheduling hydroxyzine but she defers given side effect profile. Discussed selective serotonin reuptake inhibitors but she  refuses due to potential side effects. Discussed relaxation tehcniques ?5. Neuropsych: This patient is capable of making decisions on her own behalf. ?6. Skin/Wound Care: Routine pressure relief measures.  ?7. Fluids/Electrolytes/Nutrition: Monitor I/O. ?8. HTN: Monitor BP TID. Well controlled, continue Norvasc 10 mg/day, hydralazine 25 mg every 8 hours, and metoprolol 100 mg BID. Supplement klor which will also help with HTN. Increase magnesium gluconate to 500mg  HS. D/c hctz given good control and bump in creatinine. Discussed with patient.  ?  ?Vitals:  ? 06/13/21 2004 06/14/21 0333  ?BP: 129/89 130/81  ?Pulse: 60 60  ?Resp: 17 18  ?Temp: 97.7 ?F (36.5 ?C)  97.9 ?F (36.6 ?C)  ?SpO2: 98% 96%  ? ? ? 10. CKD III: Monitor with routine checks. Recommended drinking 6-8 glasses of water per day. D/c HCTZ given worsening renal function. Creatinine improved, monitor weekly ?11. E coli UTI: Ceftriaxone started on 04/12 with resolution of leucocytosis.  ?12. Elevated LDL: On high dose Lipitor  ?13. Chronic intermittent cough: Conitnue Mucinex BID ?14. Constipation: Will start miralax bid. Increase magnesium gluconate to 500 mg HS- type 5 stool on 5/4 ?15. Undiagnosed OSA: To be started in sleep study per husband. ?16. Obesity: BMI 33.56: provide dietary education ?17. Hypokalemia: supplement klor. Repeat BMP today ?18. Right knee pain: XR shows severe patellofemoral arthritis. Lidocaine patch ordered. Apply voltaren gel, improved, continue this regimen ?19. Aphonic: continue SLP. Outpatient ENT follow-up. Discussed with patient. Respiratory muscle training ?20. ?OSA: will need to follow-up with outpatient pulmonologist ?21. Bradycardia:resolved ?22. Fear of falling: provided emotional support.  ?23. Flexor tone, mild, left side. continuekpad.  ?24. Cognitive impairment: continue SLP with emphasis on error awareness.  ?25. Cervical myofascial syndrome: Trigger Point Injections provided some relief on right side, discussed  repeating on left but she defers ?26. Loss of bowel or bladder sensation: lumbar XR ordered- discussed that it shows facet degeneration, no nerve root compression ? ?LOS: ?18 days ?A FACE TO FACE EVALUATION WAS PERFORMED ?

## 2021-06-14 NOTE — Progress Notes (Signed)
Recreational Therapy Discharge Summary ?Patient Details  ?Name: Gwendolyn Morales ?MRN: [id number] ?Date of Birth: 09/11/1965 ?Today's Date: 06/14/2021 ? ?Long term goals set: 1 ? ?Long term goals met: 1 ? ?Comments on progress toward goals: Pt is discharging home with family 5/9.  TR sessions focused on leisure education, activity analysis/modifications and stress management.  Also discussed the importance of social, emotional, spiritual health in addition to physical health and their effects on overall health and wellness.  Pt requires supervision/set up assistance for simple tasks seated.  If set up within reach, pt is Mod with simple tasks.   ? ? ?Reasons for discharge: discharge from hospital ? ?Follow-up: Home Health ? ?Patient/family agrees with progress made and goals achieved: Yes ? ?Gwendolyn Morales ?06/14/2021, 12:17 PM ? ? ?

## 2021-06-14 NOTE — Progress Notes (Signed)
Patient ID: Gwendolyn Morales, female   DOB: [birth year], 56 y.o.   MRN: [id number]  Ordered hoyer lift and hospital bed via [employer] will call husband to set up delivery. ?

## 2021-06-14 NOTE — Progress Notes (Signed)
Occupational Therapy Weekly Progress Note ? ?Patient Details  ?Name: Gwendolyn Morales ?MRN: [id number] ?Date of Birth: [birth year] ? ?Beginning of progress report period: June 06, 2021 ?End of progress report period: Jun 14, 2021 ? ?Today's Date: 06/14/2021 ?OT Individual Time: 0930-1030 ?OT Individual Time Calculation (min): 60 min  ? ? ?Patient has met 0 of 3 short term goals.  Pt is making slow progress with primary barriers being significant anxiety and fear of falling, generalized weakness with hemiplegia of left side and also with flexor hypertonicity present, and body habitus limiting pts reach during self care.  Pts family have participated in some caregiver education, with son expressing apprehension about assisting pt in toileting at bed level.  Worked with pt to find ways to stay covered and complete as much of pericare as she can but she will still need assist in removing and placing brief, and rolling left and right.    Pt was not making progress in squat pivot and stand pivot transfers including use of stedy therefore downgraded functional transfers to hoyer lift.Pts family will need more hands on training on hoyer lift use as well as bed level toileting.   ? ?Patient continues to demonstrate the following deficits: muscle weakness, muscle joint tightness, and muscle paralysis, decreased cardiorespiratoy endurance, abnormal tone, unbalanced muscle activation, motor apraxia, decreased coordination, and decreased motor planning, decreased awareness, decreased problem solving, and delayed processing, and decreased sitting balance, decreased standing balance, decreased postural control, hemiplegia, and decreased balance strategies and therefore will continue to benefit from skilled OT intervention to enhance overall performance with BADL. ? ?Patient not progressing toward long term goals.  See goal revision.. See plan of care revisions in care plan note. ? ?OT Short Term Goals ?Week 2:  OT Short Term Goal 1  (Week 2): Pt will complete sliding board transfer with drop arm BSC with mod assist. ?OT Short Term Goal 1 - Progress (Week 2): Not progressing ?OT Short Term Goal 2 (Week 2): Pt will complete sit<>stand at stedy with max assist +2 achieving full upright stance in prep for ADLs. ?OT Short Term Goal 2 - Progress (Week 2): Not progressing ?OT Short Term Goal 3 (Week 2): Pt will complete lateral lean pericare with mod assist seated on extra wide commode. ?OT Short Term Goal 3 - Progress (Week 2): Not progressing ?Week 3:  OT Short Term Goal 1 (Week 3): STGs=LTGs due to ELOS ? ?Skilled Therapeutic Interventions/Progress Updates:  ?  First session: Pt semi reclined in bed, agreeable therex LUE, no c/o pain.  Instructed pt in supine AAROM using weightless dowel to complete shoulder flexion, abduction, ER, and elbow extension against gravity (AROM) 3 x 10 reps each.  Pt hoyered to w/c dependently.  Pt brushed teeth seated at sink with setup.  Direct hand off to SPT. ? ?Second session: Pt sitting up in w/c, reports she is wet and needs brief changed.  Hoyer lift transfer to bed completed total assist.  Pt instructed through on pericare and clothing management at bed level with mod assist and towel placed over pt intermittently to simulate how she would be able to complete with son with less exposure to increase comfort and decrease anxiety.  Pt also educated on importance of regular toileting and keeping skin clean and dry and also turning in bed every two hours and redistributing weight shifting positions in w/c every 15 minutes to protect skin integrity and prevent pressure sores. Call bell in reach, bed alarm on. ? ?Therapy Documentation ?  Precautions:  ?Precautions ?Precautions: Fall ?Precaution Comments: left hemi, residual R sided weakness from 2017 CVA ?Restrictions ?Weight Bearing Restrictions: No ? ? ?Therapy/Group: Individual Therapy ? ?Caryl Asp Laiken Sandy ?06/14/2021, 2:51 PM  ?

## 2021-06-14 NOTE — Progress Notes (Signed)
Physical Therapy Session Note ? ?Patient Details  ?Name: Gwendolyn Morales ?MRN: [id number] ?Date of Birth: [birth year] ? ?Today's Date: 06/14/2021 ?PT Individual Time: 1412-1530 ?PT Individual Time Calculation (min): 78 min  ? ?Short Term Goals: ?Week 3:  PT Short Term Goal 1 (Week 3): STG = LTG due to ELOS ? ?Skilled Therapeutic Interventions/Progress Updates:  ?   ?Pt received supine in bed to start - agreeable to PT tx and denies pain. Open discussion regarding DC planning, DME rec's, home safety, and ongoing family training. Pt reporting she feels that family training yesterday was "doable" and that she is "excited" about going home. Used written communication to assist today due to difficulty hearing her.  ? ?Completed dependant hoyer lift transfer from bed to chair. Rolling towards her L side with modA and maxA towards her R to apply hoyer sling. Rehab tech entering room, thus provided +2 assist for lowering safely to the w/c in hoyer. Transported to main rehab gym for time management.  ? ?Completed +2 totalA SB transfer from w/c to mat table, towards her stronger R side. Placed step stool under her feet to assist with positioning and encourage LE assist. Used over-the-back technique to facilitate forward weight shifting.  ? ?Sitting EOM with supervision - completed tasks to target trunk strengthening, dynamic sitting balance, reaching outside BOS, and forward leaning. All tasks completed at supervision level. Tasks included modifed crunches, cone stacking outside BOS, bag toss.  ? ?SB transfer with +2 totalA with 3rd person stabilizing w/c for safety. Similar technique as above.  ? ?Returned to her room and she remained seated in w/c with all needs met, call bell in reach.  ? 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  ? ?Therapy/Group: Individual  Therapy ? ?[name] ?06/14/2021, 7:20 AM  ?

## 2021-06-14 NOTE — Plan of Care (Signed)
DC the following goals: ? ?Problem: RH Balance ?Goal: LTG Patient will maintain dynamic standing balance (PT) ?Description: LTG:  Patient will maintain dynamic standing balance with assistance during mobility activities (PT) ?Outcome: Not Applicable ?  ?Problem: Sit to Stand ?Goal: LTG:  Patient will perform sit to stand with assistance level (PT) ?Description: LTG:  Patient will perform sit to stand with assistance level (PT) ?Outcome: Not Applicable ?  ?Problem: RH Car Transfers ?Goal: LTG Patient will perform car transfers with assist (PT) ?Description: LTG: Patient will perform car transfers with assistance (PT). ?Outcome: Not Applicable ?  ?Problem: RH Ambulation ?Goal: LTG Patient will ambulate in controlled environment (PT) ?Description: LTG: Patient will ambulate in a controlled environment, # of feet with assistance (PT). ?Outcome: Not Applicable ?Goal: LTG Patient will ambulate in home environment (PT) ?Description: LTG: Patient will ambulate in home environment, # of feet with assistance (PT). ?Outcome: Not Applicable ? ? Downgrade the following goals: ? ?Problem: RH Bed to Chair Transfers ?Goal: LTG Patient will perform bed/chair transfers w/assist (PT) ?Description: LTG: Patient will perform bed to chair transfers with assistance (PT). ?Flowsheets (Taken 06/14/2021 1551) ?LTG: Pt will perform Bed to Chair Transfers with assistance level: Dependent - mechanical lift ?  ?Problem: RH Wheelchair Mobility ?Goal: LTG Patient will propel w/c in controlled environment (PT) ?Description: LTG: Patient will propel wheelchair in controlled environment, # of feet with assist (PT) ?Flowsheets (Taken 06/14/2021 1551) ?LTG: Pt will propel w/c in controlled environ  assist needed:: Supervision/Verbal cueing ?LTG: Propel w/c distance in controlled environment: 17ft ?Goal: LTG Patient will propel w/c in home environment (PT) ?Description: LTG: Patient will propel wheelchair in home environment, # of feet with assistance  (PT). ?Flowsheets (Taken 06/14/2021 1551) ?LTG: Pt will propel w/c in home environ  assist needed:: Supervision/Verbal cueing ?LTG: Propel w/c distance in home environment: 101ft ?  ?

## 2021-06-15 DIAGNOSIS — R490 Dysphonia: Secondary | ICD-10-CM

## 2021-06-15 DIAGNOSIS — N1831 Chronic kidney disease, stage 3a: Secondary | ICD-10-CM

## 2021-06-15 NOTE — Progress Notes (Signed)
Occupational Therapy Session Note ? ?Patient Details  ?Name: Gwendolyn Morales ?MRN: [id number] ?Date of Birth: 1965-10-05 ? ?Today's Date: 06/15/2021 ?OT Individual Time: 1000-1045 ?OT Individual Time Calculation (min): 45 min  ? ? ?Short Term Goals: ?Week 1:  OT Short Term Goal 1 (Week 1): Pt will complete sit<>stand with max assist in prep for ADLs ?OT Short Term Goal 1 - Progress (Week 1): Progressing toward goal ?OT Short Term Goal 2 (Week 1): Pt will complete LB dressing with mod assist ?OT Short Term Goal 2 - Progress (Week 1): Not progressing ?OT Short Term Goal 3 (Week 1): Pt will complete UB dressing with mod assist. ?OT Short Term Goal 3 - Progress (Week 1): Met ?OT Short Term Goal 4 (Week 1): Pt will complete squat pivot toilet transfer with mod assist. ?OT Short Term Goal 4 - Progress (Week 1): Revised due to lack of progress ? ?Skilled Therapeutic Interventions/Progress Updates:  ?   ?Pt received in bed with no pain  ? ?Therapeutic activity ?Focus of session on family education. Pt, son and husband present. Edu re toileting sequence at bed level/draping for privacy. Demoed sequence and had son practice sequence. Pt son requireing MOD cuing for order. Provided written handout as follows as well as handout on bed level positioning for pressure relief: ? ?Toileting at bed level - trial timed toileting to improve continence and protect skin integrity by toileting every 2-3 hours ?roll both directions to pull  pants down past hips ?drape a towel over her front ?have her roll to her L and place the bed pan under her buttocks with the thin area near the butt, thick area towards the legs.  ?give her time to void with the head of the bed and knees up in a ?chair position?- while she is voiding you can set up a trash can or towel on the floor to place used wipes or wash cloths ?allow her to clean her front area ?she will need help cleaning her buttocks ?Roll off the bed pan to clean up- make sure it stays level/tell  her when you are ready for her to roll so it does not spill on the bed Sometimes it is best ot have towels/pad underneath to not get the sheets potentially dirty.  ? ?Pt left at end of session in bed with exit alarm on, call light in reach and all needs met ? ? 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 ?Therapy/Group: Individual Therapy ? ?Lowella Dell Menashe Kafer ?06/15/2021, 6:50 AM ?

## 2021-06-15 NOTE — Progress Notes (Signed)
Physical Therapy Session Note ? ?Patient Details  ?Name: Genisis Sonnier ?MRN: [id number] ?Date of Birth: [birth year] ? ?Today's Date: 06/15/2021 ?PT Individual Time: 7614-7092 ?PT Individual Time Calculation (min): 48 min  ? ?Short Term Goals: ?Week 1:  PT Short Term Goal 1 (Week 1): Pt will complete bed mobility with modA ?PT Short Term Goal 1 - Progress (Week 1): Not met ?PT Short Term Goal 2 (Week 1): Pt will complete bed<>chair transfer with maxA and LRAD ?PT Short Term Goal 2 - Progress (Week 1): Not met ?PT Short Term Goal 3 (Week 1): Pt will initiate gait training ?PT Short Term Goal 3 - Progress (Week 1): Not met ?PT Short Term Goal 4 (Week 1): Pt will tolerate sitting OOB in chair for >2 hours outside of therapy ?PT Short Term Goal 4 - Progress (Week 1): Met ? ?Skilled Therapeutic Interventions/Progress Updates:  ?Patient supine in bed on entrance to room. Patient alert and agreeable to PT session. Son present for family education.  ? ?Patient with no pain complaint throughout session. ? ?Therapeutic Activity: ?Bed Mobility: Patient performed roll to L side with Min A to reach full sidelying. Roll to L side Roll to R side with MaxA. Education to son to allow for pt to attempt and to provide time and light assist increasing as needed to complete in order for pt to continue to progress. Educated son on placement of hoyer pad during roll. VC/ tc required for positioning. ?Transfers: Pt's son educated in Higher education careers adviser. Educated on positioning of hoyer pad loops in order to put pt in more upright seated position. Will need to use lift as much as possible and maneuvering of bed to clear all. For proper positioning in w/c, demonstrated to pt's son to use hand holds to lift pt for adequate posterior positioning for upright seated position. Provided cues to pt throughout in order to assist with movements as much as possible. ? ?Husband present halfway through session and inquiring re: power w/c. Will need to  discuss with primary therapist.  ? ?Patient seated upright  in w/c at end of session with brakes locked, no alarm set, and all needs within reach. ST taking over session.  ? ? 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  ?Vital Signs: ?  ?Pain: ?Pain Assessment ?Pain Score: 0-No pain ? ?Therapy/Group: Individual Therapy ? ?Alger Simons PT, DPT ?06/15/2021, 1:44 PM  ?

## 2021-06-15 NOTE — Progress Notes (Signed)
?                                                       PROGRESS NOTE ? ? ?Subjective/Complaints: ?Up in bed. No c/o. Slept well ? ?ROS: Patient denies fever, rash, sore throat, blurred vision, dizziness, nausea, vomiting, diarrhea, cough, shortness of breath or chest pain, joint or back/neck pain, headache, or mood change.   ? ?Objective: ?  ?No results found. ?No results for input(s): WBC, HGB, HCT, PLT in the last 72 hours. ? ? ? ?Recent Labs  ?  06/13/21 ?1129  ?NA 139  ?K 4.0  ?CL 107  ?CO2 25  ?GLUCOSE 97  ?BUN 21*  ?CREATININE 1.01*  ?CALCIUM 10.3  ? ? ? ? ?Intake/Output Summary (Last 24 hours) at 06/15/2021 0848 ?Last data filed at 06/15/2021 0825 ?Gross per 24 hour  ?Intake 1180 ml  ?Output --  ?Net 1180 ml  ?  ? ?  ? ?Physical Exam: ?Vital Signs ?Blood pressure 120/77, pulse 69, temperature 97.8 ?F (36.6 ?C), temperature source Oral, resp. rate 16, height 5\' 10"  (1.778 m), weight 106 kg, SpO2 97 %. ?Gen: no distress, normal appearing, BMI 33.53 ? ?Constitutional: No distress . Vital signs reviewed. obese ?HEENT: NCAT, EOMI, oral membranes moist ?Neck: supple ?Cardiovascular: RRR without murmur. No JVD    ?Respiratory/Chest: CTA Bilaterally without wheezes or rales. Normal effort    ?GI/Abdomen: BS +, non-tender, non-distended ?Ext: no clubbing, cyanosis, or edema ?Psych: pleasant and cooperative  ?Skin: minimal bleeding from injection site.  ?Sensory exam normal sensation to light touch and proprioception in bilateral upper and lower extremities ?Hypophonic dysarthria ?Musculoskeletal: Full range of motion in all 4 extremities. No joint swelling. Tight levator scapulae bilaterally ?Right knee TTP, TTP in her left shoulder ? Mental Status: She is alert.  ?   Sensory: Sensory deficit present.  ?   Motor: Weakness (mild left sided weakness after therapy in 2017--worse now) present.  ?   Comments: Left facial paresis with severe dysarthria with low voice volume--no change--is comprehensible. Left hemiplegia with  emerging flexor tone LUE.  ?  ? ? ? ?Assessment/Plan: ?1. Functional deficits which require 3+ hours per day of interdisciplinary therapy in a comprehensive inpatient rehab setting. ?Physiatrist is providing close team supervision and 24 hour management of active medical problems listed below. ?Physiatrist and rehab team continue to assess barriers to discharge/monitor patient progress toward functional and medical goals ? ?Care Tool: ? ?Bathing ?   ?   ? Body parts bathed by helper: Buttocks, Front perineal area ?  ?  ?Bathing assist Assist Level: Dependent - Patient 0% ?  ?  ?Upper Body Dressing/Undressing ?Upper body dressing   ?What is the patient wearing?: [place] only ?   ?Upper body assist Assist Level: Dependent - Patient 0% ?   ?Lower Body Dressing/Undressing ?Lower body dressing ? ? ?   ?What is the patient wearing?: [place] only ? ?  ? ?Lower body assist Assist for lower body dressing: Dependent - Patient 0% ?   ? ?Toileting ?Toileting    ?Toileting assist Assist for toileting: Dependent - Patient 0% ?  ?  ?Transfers ?Chair/bed transfer ? ?Transfers assist ?   ? ?Chair/bed transfer assist level: Dependent - mechanical lift Clarise Cruz Plus) ?  ?  ?Locomotion ?Ambulation ? ? ?Ambulation  assist ? ? Ambulation activity did not occur: Safety/medical concerns ? ?  ?  ?   ? ?Walk 10 feet activity ? ? ?Assist ? Walk 10 feet activity did not occur: Safety/medical concerns ? ?  ?   ? ?Walk 50 feet activity ? ? ?Assist Walk 50 feet with 2 turns activity did not occur: Safety/medical concerns ? ?  ?   ? ? ?Walk 150 feet activity ? ? ?Assist Walk 150 feet activity did not occur: Safety/medical concerns ? ?  ?  ?  ? ?Walk 10 feet on uneven surface  ?activity ? ? ?Assist Walk 10 feet on uneven surfaces activity did not occur: Safety/medical concerns ? ? ?  ?   ? ?Wheelchair ? ? ? ? ?Assist Is the patient using a wheelchair?: Yes ?Type of Wheelchair: Manual ?  ? ?Wheelchair assist level: Dependent - Patient  0% ?Max wheelchair distance: 157ft  ? ? ?Wheelchair 50 feet with 2 turns activity ? ? ? ?Assist ? ?  ?  ? ? ?Assist Level: Dependent - Patient 0%  ? ?Wheelchair 150 feet activity  ? ? ? ?Assist ?   ? ? ?Assist Level: Dependent - Patient 0%  ? ?Blood pressure 120/77, pulse 69, temperature 97.8 ?F (36.6 ?C), temperature source Oral, resp. rate 16, height 5\' 10"  (1.778 m), weight 106 kg, SpO2 97 %. ? ?Medical Problem List and Plan: ?1. 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 ?            -patient may shower ?            -ELOS/Goals: 14-20d Min/Mod A ? HFU scheduled ? -Continue CIR therapies including PT, OT, and SLP  ? 2 person assist to stand in [place], total A ? Hoyer lift ordered ?2.  Antithrombotics: ?-DVT/anticoagulation:  Pharmaceutical: Lovenox ?            -antiplatelet therapy: ASA/Plavix X 3 weeks followed by ASA alone.  ?3. Shoulder pain: continue tylenol prn. continuescheduled voltaren gel to QID. Kpad ordered. Discussed stronger pain medications but she defers.  ?4. Anxiety: discussed scheduling hydroxyzine but she defers given side effect profile. Discussed selective serotonin reuptake inhibitors but she refuses due to potential side effects. Discussed relaxation tehcniques ?5. Neuropsych: This patient is capable of making decisions on her own behalf. ?6. Skin/Wound Care: Routine pressure relief measures.  ?7. Fluids/Electrolytes/Nutrition: Monitor I/O. ?8. HTN: Monitor BP TID. Well controlled, continue Norvasc 10 mg/day, hydralazine 25 mg every 8 hours, and metoprolol 100 mg BID. Supplement klor which will also help with HTN. Increase magnesium gluconate to 500mg  HS. D/c hctz given good control and bump in creatinine. Discussed with patient.  ?  ?Vitals:  ? 06/14/21 1953 06/15/21 0346  ?BP: (!) 141/81 120/77   ?Pulse: (!) 59 69  ?Resp: 18 16  ?Temp: 98.2 ?F (36.8 ?C) 97.8 ?F (36.6 ?C)  ?SpO2: 96% 97%  ? ? ? 10. CKD III: Monitor with routine checks. Recommended drinking 6-8 glasses of water per day. D/c HCTZ given worsening renal function. Creatinine recently improved, monitor weekly ?11. E coli UTI: Ceftriaxone started on 04/12 with resolution of leucocytosis.  ?12. Elevated LDL: On high dose Lipitor  ?13. Chronic intermittent cough: Conitnue Mucinex BID ?14. Constipation: Will start miralax bid. Increase magnesium gluconate to 500 mg HS- type 6 stool on 5/6 ?15.  Undiagnosed OSA: To be started in sleep study per husband. ?16. Obesity: BMI 33.56: provide dietary education ?17. Hypokalemia: supplement klor.   ?18. Right knee pain: XR shows severe patellofemoral arthritis. Lidocaine patch ordered. Apply voltaren gel, improved, continue this regimen ?19. Aphonic: continue SLP. Outpatient ENT follow-up. Discussed with patient. Respiratory muscle training ?20. ?OSA: will need to follow-up with outpatient pulmonologist ?21. Bradycardia:resolved ?22. Fear of falling: provided emotional support.  ?23. Flexor tone, mild, left side. continuekpad.  ?24. Cognitive impairment: continue SLP with emphasis on error awareness.  ?25. Cervical myofascial syndrome: Trigger Point Injections provided some relief on right side, discussed repeating on left but she defers ?26. Loss of bowel or bladder sensation: lumbar XR ordered-   it shows facet degeneration, no nerve root compression ? ?LOS: ?19 days ?A FACE TO FACE EVALUATION WAS PERFORMED ? ?Meredith Staggers ?06/15/2021, 8:48 AM  ? ?  ?

## 2021-06-15 NOTE — Progress Notes (Signed)
Speech Language Pathology Daily Session Note ? ?Patient Details  ?Name: Gwendolyn Morales ?MRN: [id number] ?Date of Birth: Oct 09, 1965 ? ?Today's Date: 06/15/2021 ?SLP Individual Time: 0175-1025 ?SLP Individual Time Calculation (min): 44 min ? ?Short Term Goals: ?Week 3: SLP Short Term Goal 1 (Week 3): STG's = LTG's due to ELOS ? ?Skilled Therapeutic Interventions:Skilled ST services focused on education and speech skills. Pt's husband and son present for education. SLP facilitated education pertaining to speech and cognitive skills. Pt's husband supports cognition appears at baseline, pt was able to complete complex bill paying via computer during hospital stay. Pt's supports changes in vocal intensity 3-4 months prior to hospitalization and supports a baseline of 80% intelligibility now reduced to 35% intelligibility. SLP provided education pertaining to factors in increase communication (ex: reducing background noise, producing 1-2 words per breath and written/gestural communication.) Family and pt demonstrated recall of EMST instructions and pt returned demonstration. Pt was unable to utilize IMST adaptor at this time, f/u SLP services can continue to address this. Pt's husband expressed frustration over limited communication during hospital stay, SLP provided support. All questions answered to satisfaction. Pt was left in room with family, call bell within reach and chair alarm set. SLP recommends to continue skilled services. ?   ? ?Pain ?Pain Assessment ?Pain Score: 0-No pain ? ?Therapy/Group: Individual Therapy ? ?Bakary Bramer ?06/15/2021, 12:19 PM ?

## 2021-06-16 MED ORDER — MENTHOL 3 MG MT LOZG
1.0000 | LOZENGE | OROMUCOSAL | Status: DC | PRN
  Administered 2021-06-16: 3 mg via ORAL
  Filled 2021-06-16: qty 9

## 2021-06-16 NOTE — Progress Notes (Signed)
?                                                       PROGRESS NOTE ? ? ?Subjective/Complaints: ?Patient seen while in bed.  Has complaint today of stuffy nose, sneezing, and sore throat.  Unsure if allergies or cold related. ? ? ?ROS: Patient denies fever, rash, sore throat, blurred vision, dizziness, nausea, vomiting, diarrhea, cough, shortness of breath or chest pain, joint or back/neck pain, headache, or mood change.   ? ?Objective: ?  ?No results found. ?No results for input(s): WBC, HGB, HCT, PLT in the last 72 hours. ? ? ? ?No results for input(s): NA, K, CL, CO2, GLUCOSE, BUN, CREATININE, CALCIUM in the last 72 hours. ? ? ? ? ?Intake/Output Summary (Last 24 hours) at 06/16/2021 1430 ?Last data filed at 06/15/2021 1830 ?Gross per 24 hour  ?Intake 240 ml  ?Output --  ?Net 240 ml  ?  ? ?  ? ?Physical Exam: ?Vital Signs ?Blood pressure 111/71, pulse 66, temperature 98.3 ?F (36.8 ?C), temperature source Oral, resp. rate 18, height 5\' 10"  (1.778 m), weight 106 kg, SpO2 99 %. ?Gen: no distress, normal appearing, BMI 33.53 ? ?Constitutional: No distress . Vital signs reviewed. obese ?HEENT: NCAT, EOMI, oral membranes moist, nasal congestion, sore throat ?Neck: supple ?Cardiovascular: RRR without murmur. No JVD    ?Respiratory/Chest: CTA Bilaterally without wheezes or rales. Normal effort    ?GI/Abdomen: BS +, non-tender, non-distended ?Ext: no clubbing, cyanosis, or edema ?Psych: pleasant and cooperative  ?Skin: minimal bleeding from injection site.  ?Sensory exam normal sensation to light touch and proprioception in bilateral upper and lower extremities ?Hypophonic dysarthria ?Musculoskeletal: Full range of motion in all 4 extremities. No joint swelling. Tight levator scapulae bilaterally ?Right knee TTP, TTP in her left shoulder ? Mental Status: She is alert.  ?   Sensory: Sensory deficit present.  ?   Motor: Weakness (mild left sided weakness after therapy in 2017--worse now) present.  ?   Comments: Left facial  paresis with severe dysarthria with low voice volume--no change--is comprehensible. 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  ?  ? ? ? ?Assessment/Plan: ?1. Functional deficits which require 3+ hours per day of interdisciplinary therapy in a comprehensive inpatient rehab setting. ?Physiatrist is providing close team supervision and 24 hour management of active medical problems listed below. ?Physiatrist and rehab team continue to assess barriers to discharge/monitor patient progress toward functional and medical goals ? ?Care Tool: ? ?Bathing ?   ?   ? Body parts bathed by helper: Buttocks, Front perineal area ?  ?  ?Bathing assist Assist Level: Dependent - Patient 0% ?  ?  ?Upper Body Dressing/Undressing ?Upper body dressing   ?What is the patient wearing?: Hospital gown only ?   ?Upper body assist Assist Level: Dependent - Patient 0% ?   ?Lower Body Dressing/Undressing ?Lower body dressing ? ? ?   ?What is the patient wearing?: Hospital gown only ? ?  ? ?Lower body assist Assist for lower body dressing: Dependent - Patient 0% ?   ? ?Toileting ?Toileting    ?Toileting assist Assist for toileting: Dependent - Patient 0% ?  ?  ?Transfers ?Chair/bed transfer ? ?Transfers assist ?   ? ?Chair/bed transfer assist level: Dependent - mechanical lift Plus) ?  ?  ?  Locomotion ?Ambulation ? ? ?Ambulation assist ? ? Ambulation activity did not occur: Safety/medical concerns ? ?  ?  ?   ? ?Walk 10 feet activity ? ? ?Assist ? Walk 10 feet activity did not occur: Safety/medical concerns ? ?  ?   ? ?Walk 50 feet activity ? ? ?Assist Walk 50 feet with 2 turns activity did not occur: Safety/medical concerns ? ?  ?   ? ? ?Walk 150 feet activity ? ? ?Assist Walk 150 feet activity did not occur: Safety/medical concerns ? ?  ?  ?  ? ?Walk 10 feet on uneven surface  ?activity ? ? ?Assist Walk 10 feet on uneven surfaces activity did not occur: Safety/medical concerns ? ? ?  ?   ? ?Wheelchair ? ? ? ? ?Assist Is the patient using  a wheelchair?: Yes ?Type of Wheelchair: Manual ?  ? ?Wheelchair assist level: Dependent - Patient 0% ?Max wheelchair distance: 143ft  ? ? ?Wheelchair 50 feet with 2 turns activity ? ? ? ?Assist ? ?  ?  ? ? ?Assist Level: Dependent - Patient 0%  ? ?Wheelchair 150 feet activity  ? ? ? ?Assist ?   ? ? ?Assist Level: Dependent - Patient 0%  ? ?Blood pressure 111/71, pulse 66, temperature 98.3 ?F (36.8 ?C), temperature source Oral, resp. rate 18, height 5\' 10"  (1.778 m), weight 106 kg, SpO2 99 %. ? ?Medical Problem List and Plan: ?1. 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 ?            -patient may shower ?            -ELOS/Goals: 14-20d Min/Mod A ? HFU scheduled ? -Continue CIR therapies including PT, OT, and SLP  ? 2 person assist to stand in [place], total A ? Hoyer lift ordered ?2.  Antithrombotics: ?-DVT/anticoagulation:  Pharmaceutical: Lovenox ?            -antiplatelet therapy: ASA/Plavix X 3 weeks followed by ASA alone.  ?3. Shoulder pain: continue tylenol prn. continuescheduled voltaren gel to QID. Kpad ordered. Discussed stronger pain medications but she defers.  ?4. Anxiety: discussed scheduling hydroxyzine but she defers given side effect profile. Discussed selective serotonin reuptake inhibitors but she refuses due to potential side effects. Discussed relaxation tehcniques ?5. Neuropsych: This patient is capable of making decisions on her own behalf. ?6. Skin/Wound Care: Routine pressure relief measures.  ?7. Fluids/Electrolytes/Nutrition: Monitor I/O. ?8. HTN: Monitor BP TID. Well controlled, continue Norvasc 10 mg/day, hydralazine 25 mg every 8 hours, and metoprolol 100 mg BID. Supplement klor which will also help with HTN. Increase magnesium gluconate to 500mg  HS. D/c hctz given good control and bump in  creatinine. Discussed with patient.  ?5/7 BP today stable 111/71-130/82. ?  ?Vitals:  ? 06/16/21 0413 06/16/21 1425  ?BP: 130/82 111/71  ?Pulse: 67 66  ?Resp: 20 18  ?Temp: (!) 97.5 ?F (36.4 ?C) 98.3 ?F (36.8 ?C)  ?SpO2: 96% 99%  ? ? ? 10. CKD III: Monitor with routine checks. Recommended drinking 6-8 glasses of water per day. D/c HCTZ given worsening renal function. Creatinine recently improved, monitor weekly ?11. E coli UTI: Ceftriaxone started on 04/12 with resolution of leucocytosis.  ?12. Elevated LDL: On high dose Lipitor  ?13. Chronic intermittent cough: Conitnue Mucinex BID ?14. Constipation: Will start miralax bid. Increase magnesium gluconate to  500 mg HS- type 6 stool on 5/6 ?-5/7 No BM today, Continue Miralax BID. ?15. Undiagnosed OSA: To be started in sleep study per husband. ?16. Obesity: BMI 33.56: provide dietary education ?17. Hypokalemia: supplement klor.   ?18. Right knee pain: XR shows severe patellofemoral arthritis. Lidocaine patch ordered. Apply voltaren gel, improved, continue this regimen ?19. Aphonic: continue SLP. Outpatient ENT follow-up. Discussed with patient. Respiratory muscle training ?5/7 Continue use of paper/whiteboard for communication. ?20. ?OSA: will need to follow-up with outpatient pulmonologist ?21. Bradycardia:resolved ?-5/7 HR in the 60's ?22. Fear of falling: provided emotional support.  ?23. Flexor tone, mild, left side. continuekpad.  ?24. Cognitive impairment: continue SLP with emphasis on error awareness.  ?25. Cervical myofascial syndrome: Trigger Point Injections provided some relief on right side, discussed repeating on left but she defers ?26. Loss of bowel or bladder sensation: lumbar XR ordered-   it shows facet degeneration, no nerve root compression ?27. Allergy/Cold symptoms: ?5/7 Cepacol ordered for sore throat pain, continue Mucinex for nasal congestion along with warm fluids. ? ?LOS: ?20 days ?A FACE TO FACE EVALUATION WAS PERFORMED ? ?Tressia MinersLisa  Vasilia Dise ?06/16/2021, 2:30 PM  ? ?  ?

## 2021-06-16 NOTE — Progress Notes (Signed)
Speech Language Pathology Daily Session Note ? ?Patient Details  ?Name: Gwendolyn Morales ?MRN: [id number] ?Date of Birth: 06-01-1965 ? ?Today's Date: 06/16/2021 ?SLP Individual Time: 1100-1130 ?SLP Individual Time Calculation (min): 30 min ? ?Short Term Goals: ?Week 3: SLP Short Term Goal 1 (Week 3): STG's = LTG's due to ELOS ? ?Skilled Therapeutic Interventions: ?  Pt  for husband were seen for scheduled education this date. Initially, husband was not in room for education (previous OT reported pt was upset with hospital treatment and left). Halfway through session, husband appeared and was receptive to discussing training; though, was extremely unhappy with training from rehab and hospital services. SLP facilitated edu session with allowing pt/husband to ask lingering questions first. Pt had question regarding CVA recovery. SLP edu on progression of spontaneous recovery and how research shows recovery can continue (though slower) for up to several years post CVA. SLP provided pt with a visual to increase understanding. SLP provided husband and pt with 3 handouts for speech intelligibility - "For speaker", "For Listener", and "Environmental Modifications". Pt was instructed to write down any questions regarding communication strategies for future ST session. Pt and husband demonstrated understanding verbally and reported they had no other questions at this time. Pt was left in room, with all immediate needs within reach, and alarm activated. Cont with current POC.  ? ?Pain ?Pain Assessment ?Pain Score: 0-No pain ? ?Therapy/Group: Individual Therapy ? ?Michelene Gardener Renette Butters ?06/16/2021, 12:33 PM ?

## 2021-06-16 NOTE — Progress Notes (Addendum)
Occupational Therapy Session Note ? ?Patient Details  ?Name: Gwendolyn Morales ?MRN: [id number] ?Date of Birth: 1965/10/29 ? ?Today's Date: 06/16/2021 ?OT Individual Time: 1020-1100 ?OT Individual Time Calculation (min): 40 min  ? ? ?Short Term Goals: ?Week 3:  OT Short Term Goal 1 (Week 3): STGs=LTGs due to ELOS ? ?Skilled Therapeutic Interventions/Progress Updates:  ?  Pt semi reclined in bed, husband arriving soon after OT arrival.  Lengthy discussion with husband and patient regarding dc planning.  Husband had multiple concerns and complaints. He reports feeling aggravated due to lack of communication from hospital staff including having received a text from equipment company that stated DME would be delivered to wrong address. He reports that he called CSW and felt that he did not get the assistance he needed to sort things out.  He reports feeling frustrated not knowing what time equipment will be delivered and what time patient will be transported by ambulance to the house because he has to work and cannot take off the whole day.  Also reports feeling frustrated due to not being told what time therapy would occur today, until last night.  Husband reports his son did not come for personal reasons but did not state specifics.  OT validated husbands concerns and offered to assist in increasing communication to CSW by notifying regarding concerns. Also offered husband hands on training for hoyer lift transferring and bed level self care, however he reported "you do it".  Educated husband on importance of hands on training with return demonstration and encouraged him to participate however husband walked out of room without response.  OT checked pt for incontinence due to pt reporting she felt soiled, however noted pts brief to be dry.  Direct hand off to SPT.   ? ?Therapy Documentation ?Precautions:  ?Precautions ?Precautions: Fall ?Precaution Comments: left hemi, residual R sided weakness from 2017  CVA ?Restrictions ?Weight Bearing Restrictions: No ? ? ? ?Therapy/Group: Individual Therapy ? ?Dian Situ Dennisse Swader ?06/16/2021, 12:45 PM ?

## 2021-06-16 NOTE — Plan of Care (Signed)
?  Problem: Consults ?Goal: RH STROKE PATIENT EDUCATION ?Description: See Patient Education module for education specifics  ?Outcome: Progressing ?  ?Problem: RH BOWEL ELIMINATION ?Goal: RH STG MANAGE BOWEL WITH ASSISTANCE ?Description: STG Manage Bowel with mod I Assistance. ?Outcome: Progressing ?Goal: RH STG MANAGE BOWEL W/MEDICATION W/ASSISTANCE ?Description: STG Manage Bowel with Medication with mod I Assistance. ?Outcome: Progressing ?  ?Problem: RH BLADDER ELIMINATION ?Goal: RH STG MANAGE BLADDER WITH ASSISTANCE ?Description: STG Manage Bladder With toileting Assistance ?Outcome: Progressing ?  ?Problem: RH SAFETY ?Goal: RH STG ADHERE TO SAFETY PRECAUTIONS W/ASSISTANCE/DEVICE ?Description: STG Adhere to Safety Precautions With cues Assistance/Device. ?Outcome: Progressing ?  ?Problem: RH PAIN MANAGEMENT ?Goal: RH STG PAIN MANAGED AT OR BELOW PT'S PAIN GOAL ?Description: At or below level 4 with prns ?Outcome: Progressing ?  ?Problem: RH KNOWLEDGE DEFICIT ?Goal: RH STG INCREASE KNOWLEDGE OF HYPERTENSION ?Description: Patient and spouse will be able to manage HTN with medications, dietary modifications using handouts and educational resources independently ?Outcome: Progressing ?Goal: RH STG INCREASE KNOWLEGDE OF HYPERLIPIDEMIA ?Description: Patient and spouse will be able to manage HLD with medications, dietary modifications using handouts and educational resources independently ?Outcome: Progressing ?Goal: RH STG INCREASE KNOWLEDGE OF STROKE PROPHYLAXIS ?Description: Patient and spouse will be able to manage secondary stroke risks with medications, dietary modifications using handouts and educational resources independently ?Outcome: Progressing ?  ?

## 2021-06-16 NOTE — Progress Notes (Signed)
Physical Therapy Session Note ? ?Patient Details  ?Name: Gwendolyn Morales ?MRN: [id number] ?Date of Birth: 05/26/1965 ? ?Today's Date: 06/16/2021 ?PT Individual Time: [id number] ?PT Individual Time Calculation (min): 60 min  ? ?Short Term Goals: ?Week 3:  PT Short Term Goal 1 (Week 3): STG = LTG due to ELOS ? ?Skilled Therapeutic Interventions/Progress Updates:  ?   ?Patient in bed with her husband at bedside upon PT arrival. Patient alert and agreeable to PT session. Patient denied pain during session. ? ?Patient's husband expressed concerns about patient's d/c planning and inconsistencies with education. Provided active listening and addressed concerns as able. Focused on review of patient care education for bed mobility, bed pan placement, sling placement, and hoyer lift use using verbal teach-back method. Provided confirming education for his questions regarding sling placement and removal in sitting, pressure relief, w/c mobility, and addressing power mobility options. Will reach out to [facility] for consult prior to or following d/c.  ? ?Reviewed slide board transfers as a progression of mobility for home transfers and car transfers. Demonstrated bed mobility with use of bed rail with patient performing with min-mod A level with mod-max cues for sequencing and slide board transfer bed>w/c with total A +2 with patient's husband performing +2 assist. Educated on use of hoyer lift for home transfers at this time for decreased burden of care and patient/caregiver safety due to current level of assist at total A +2 for slide board transfers.  ? ?Patient's husband appreciative of discussion and education, will follow-up with appropriate staff to address patient's husband's concerns. ? ?Patient in w/c set-up for lunch with her husband in the room at end of session with breaks locked and all needs in reach. ? ?Therapy Documentation ?Precautions:  ?Precautions ?Precautions: Fall ?Precaution Comments: left hemi, residual R sided  weakness from 2017 CVA ?Restrictions ?Weight Bearing Restrictions: No ? ? ? ?Therapy/Group: Individual Therapy ? ?Doreene Burke PT, DPT ? ?06/16/2021, 12:49 PM  ?

## 2021-06-17 LAB — BASIC METABOLIC PANEL
Anion gap: 8 (ref 5–15)
BUN: 19 mg/dL (ref 6–20)
CO2: 23 mmol/L (ref 22–32)
Calcium: 10.3 mg/dL (ref 8.9–10.3)
Chloride: 110 mmol/L (ref 98–111)
Creatinine, Ser: 1.02 mg/dL — ABNORMAL HIGH (ref 0.44–1.00)
GFR, Estimated: >60 mL/min (ref >60)
Glucose, Bld: 95 mg/dL (ref 70–99)
Potassium: 4.1 mmol/L (ref 3.5–5.1)
Sodium: 141 mmol/L (ref 135–145)

## 2021-06-17 LAB — CBC
HCT: 45.2 % (ref 36.0–46.0)
Hemoglobin: 14.8 g/dL (ref 12.0–15.0)
MCH: 26.9 pg (ref 26.0–34.0)
MCHC: 32.7 g/dL (ref 30.0–36.0)
MCV: 82.2 fL (ref 80.0–100.0)
Platelets: 354 10*3/uL (ref 150–400)
RBC: 5.5 MIL/uL — ABNORMAL HIGH (ref 3.87–5.11)
RDW: 15.2 % (ref 11.5–15.5)
WBC: 8.6 10*3/uL (ref 4.0–10.5)
nRBC: 0 % (ref 0.0–0.2)

## 2021-06-17 MED ORDER — HYDRALAZINE HCL 25 MG PO TABS: 25.0000 mg | ORAL_TABLET | Freq: Three times a day (TID) | ORAL | 0 refills | Status: DC

## 2021-06-17 MED ORDER — TRAZODONE HCL 50 MG PO TABS: 25.0000 mg | ORAL_TABLET | Freq: Every evening | ORAL | 0 refills | Status: DC | PRN

## 2021-06-17 MED ORDER — AMLODIPINE BESYLATE 10 MG PO TABS: 10.0000 mg | ORAL_TABLET | Freq: Every day | ORAL | 0 refills | Status: DC

## 2021-06-17 MED ORDER — MAGNESIUM GLUCONATE 500 MG PO TABS: 500.0000 mg | ORAL_TABLET | Freq: Every day | ORAL | 0 refills | Status: DC

## 2021-06-17 MED ORDER — LIDOCAINE 5 % EX PTCH: 1.0000 | MEDICATED_PATCH | CUTANEOUS | 0 refills | Status: DC

## 2021-06-17 MED ORDER — METOPROLOL TARTRATE 75 MG PO TABS: 75.0000 mg | ORAL_TABLET | Freq: Two times a day (BID) | ORAL | 0 refills | Status: DC

## 2021-06-17 MED ORDER — ATORVASTATIN CALCIUM 80 MG PO TABS: 80.0000 mg | ORAL_TABLET | Freq: Every day | ORAL | 0 refills | Status: DC

## 2021-06-17 MED ORDER — HYDROXYZINE HCL 25 MG PO TABS: 25.0000 mg | ORAL_TABLET | Freq: Three times a day (TID) | ORAL | 0 refills | Status: DC | PRN

## 2021-06-17 MED ORDER — FERROUS SULFATE 325 (65 FE) MG PO TABS: 325.0000 mg | ORAL_TABLET | Freq: Every day | ORAL | 0 refills | Status: AC

## 2021-06-17 MED ORDER — POLYETHYLENE GLYCOL 3350 17 G PO PACK: 17.0000 g | PACK | Freq: Two times a day (BID) | ORAL | 0 refills | Status: DC

## 2021-06-17 MED ORDER — DICLOFENAC SODIUM 1 % EX GEL: 2.0000 g | Freq: Four times a day (QID) | CUTANEOUS | 0 refills | Status: DC

## 2021-06-17 NOTE — Progress Notes (Signed)
?                                                       PROGRESS NOTE ? ? ?Subjective/Complaints: ?No new complaints this morning ?Says she had a rough weekend because her husband was moody ?Right knee pain feels ok.  ? ?ROS: Patient denies fever, rash, sore throat, blurred vision, dizziness, nausea, vomiting, diarrhea, cough, shortness of breath or chest pain, joint or back/neck pain, headache, or mood change.  +right knee pain ? ?Objective: ?  ?No results found. ?No results for input(s): WBC, HGB, HCT, PLT in the last 72 hours. ? ? ? ?No results for input(s): NA, K, CL, CO2, GLUCOSE, BUN, CREATININE, CALCIUM in the last 72 hours. ? ? ? ? ?Intake/Output Summary (Last 24 hours) at 06/17/2021 1004 ?Last data filed at 06/17/2021 0807 ?Gross per 24 hour  ?Intake 240 ml  ?Output --  ?Net 240 ml  ?  ? ?  ? ?Physical Exam: ?Vital Signs ?Blood pressure 132/77, pulse 64, temperature 98.2 ?F (36.8 ?C), temperature source Oral, resp. rate 17, height 5\' 10"  (1.778 m), weight 106 kg, SpO2 96 %. ?Gen: no distress, normal appearing, BMI 33.53 ? ?Constitutional: No distress . Vital signs reviewed. obese ?HEENT: NCAT, EOMI, oral membranes moist ?Neck: supple ?Cardiovascular: RRR without murmur. No JVD    ?Respiratory/Chest: CTA Bilaterally without wheezes or rales. Normal effort    ?GI/Abdomen: BS +, non-tender, non-distended ?Ext: no clubbing, cyanosis, or edema ?Psych: pleasant and cooperative  ?Skin: minimal bleeding from injection site.  ?Sensory exam normal sensation to light touch and proprioception in bilateral upper and lower extremities ?Hypophonic dysarthria ?Musculoskeletal: Full range of motion in all 4 extremities. No joint swelling. Tight levator scapulae bilaterally ?Right knee TTP, TTP in her left shoulder ? Mental Status: She is alert.  ?   Sensory: Sensory deficit present.  ?   Motor: Weakness (mild left sided weakness after therapy in 2017--worse now) present.  ?   Comments: Left facial paresis with severe  dysarthria with low voice volume--no change--is comprehensible. 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  ?MinA bed mobility ?  ? ? ? ?Assessment/Plan: ?1. Functional deficits which require 3+ hours per day of interdisciplinary therapy in a comprehensive inpatient rehab setting. ?Physiatrist is providing close team supervision and 24 hour management of active medical problems listed below. ?Physiatrist and rehab team continue to assess barriers to discharge/monitor patient progress toward functional and medical goals ? ?Care Tool: ? ?Bathing ?   ?   ? Body parts bathed by helper: Buttocks, Front perineal area ?  ?  ?Bathing assist Assist Level: Dependent - Patient 0% ?  ?  ?Upper Body Dressing/Undressing ?Upper body dressing   ?What is the patient wearing?: Hospital gown only ?   ?Upper body assist Assist Level: Dependent - Patient 0% ?   ?Lower Body Dressing/Undressing ?Lower body dressing ? ? ?   ?What is the patient wearing?: Hospital gown only ? ?  ? ?Lower body assist Assist for lower body dressing: Dependent - Patient 0% ?   ? ?Toileting ?Toileting    ?Toileting assist Assist for toileting: Dependent - Patient 0% ?  ?  ?Transfers ?Chair/bed transfer ? ?Transfers assist ?   ? ?Chair/bed transfer assist level: Dependent - mechanical lift Plus) ?  ?  ?  Locomotion ?Ambulation ? ? ?Ambulation assist ? ? Ambulation activity did not occur: Safety/medical concerns ? ?  ?  ?   ? ?Walk 10 feet activity ? ? ?Assist ? Walk 10 feet activity did not occur: Safety/medical concerns ? ?  ?   ? ?Walk 50 feet activity ? ? ?Assist Walk 50 feet with 2 turns activity did not occur: Safety/medical concerns ? ?  ?   ? ? ?Walk 150 feet activity ? ? ?Assist Walk 150 feet activity did not occur: Safety/medical concerns ? ?  ?  ?  ? ?Walk 10 feet on uneven surface  ?activity ? ? ?Assist Walk 10 feet on uneven surfaces activity did not occur: Safety/medical concerns ? ? ?  ?   ? ?Wheelchair ? ? ? ? ?Assist Is the patient using  a wheelchair?: Yes ?Type of Wheelchair: Manual ?  ? ?Wheelchair assist level: Dependent - Patient 0% ?Max wheelchair distance: 14450ft  ? ? ?Wheelchair 50 feet with 2 turns activity ? ? ? ?Assist ? ?  ?  ? ? ?Assist Level: Dependent - Patient 0%  ? ?Wheelchair 150 feet activity  ? ? ? ?Assist ?   ? ? ?Assist Level: Dependent - Patient 0%  ? ?Blood pressure 132/77, pulse 64, temperature 98.2 ?F (36.8 ?C), temperature source Oral, resp. rate 17, height 5\' 10"  (1.778 m), weight 106 kg, SpO2 96 %. ? ?Medical Problem List and Plan: ?1. 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 ?            -patient may shower ?            -ELOS/Goals: 14-20d Min/Mod A ? HFU scheduled ? -Continue CIR therapies including PT, OT, and SLP  ? 2 [place] ? Hoyer lift ordered ?2.  Antithrombotics: ?-DVT/anticoagulation:  Pharmaceutical: Lovenox ?            -antiplatelet therapy: ASA/Plavix X 3 weeks followed by ASA alone.  ?3. Shoulder pain: continue tylenol prn. continuescheduled voltaren gel to QID. Kpad ordered. Discussed stronger pain medications but she defers.  ?4. Anxiety: discussed scheduling hydroxyzine but she defers given side effect profile. Discussed selective serotonin reuptake inhibitors but she refuses due to potential side effects. Discussed relaxation tehcniques, continue these.  ?5. Neuropsych: This patient is capable of making decisions on her own behalf. ?6. Skin/Wound Care: Routine pressure relief measures.  ?7. Fluids/Electrolytes/Nutrition: Monitor I/O. ?8. HTN: Monitor BP TID. Well controlled, continue Norvasc 10 mg/day, hydralazine 25 mg every 8 hours, and metoprolol 100 mg BID. Supplement klor which will also help with HTN. Increase magnesium gluconate to 500mg  HS. D/c hctz given good control  and bump in creatinine. Discussed with patient.  ?  ?Vitals:  ? 06/16/21 2016 06/17/21 0422  ?BP: 115/70 132/77  ?Pulse: 80 64  ?Resp: 18 17  ?Temp: 97.7 ?F (36.5 ?C) 98.2 ?F (36.8 ?C)  ?SpO2: 93% 96%  ? ? ? 10. CKD III: Monitor with routine checks. Recommended drinking 6-8 glasses of water per day. D/c HCTZ given worsening renal function. Creatinine recently improved, monitor weekly ?11. E coli UTI: Ceftriaxone started on 04/12 with resolution of leucocytosis.  ?12. Elevated LDL: On high dose Lipitor  ?13. Chronic intermittent cough: Conitnue Mucinex BID ?14. Constipation: Will start miralax bid. Increase magnesium gluconate to 500 mg HS-  type 6 stool on 5/6 ?15. Undiagnosed OSA: To be started in sleep study per husband. ?16. Obesity: BMI 33.56: provide dietary education ?17. Hypokalemia: supplement klor.   ?18. Right knee pain: XR shows severe patellofemoral arthritis. Lidocaine patch ordered. Apply voltaren gel, improved, continue this regimen ?19. Aphonic: continue SLP. Outpatient ENT follow-up. Discussed with patient. Respiratory muscle training ?20. ?OSA: will need to follow-up with outpatient pulmonologist ?21. Bradycardia:resolved ?22. Fear of falling: provided emotional support.  ?23. Flexor tone, mild, left side. continuekpad.  ?24. Cognitive impairment: continue SLP with emphasis on error awareness.  ?25. Cervical myofascial syndrome: Trigger Point Injections provided some relief on right side, discussed repeating on left but she defers ?26. Loss of bowel or bladder sensation: lumbar XR ordered-   it shows facet degeneration, no nerve root compression ? ?LOS: ?21 days ?A FACE TO FACE EVALUATION WAS PERFORMED ? ?Gwendolyn Morales Gwendolyn Morales ?06/17/2021, 10:04 AM  ? ?  ?

## 2021-06-17 NOTE — Progress Notes (Signed)
Speech Language Pathology Discharge Summary ? ?Patient Details  ?Name: Gwendolyn Morales ?MRN: [id number] ?Date of Birth: April 04, 1965 ? ?Today's Date: 06/17/2021 ?SLP Individual Time: 5102-5852 ?SLP Individual Time Calculation (min): 45 min ? ? ?Skilled Therapeutic Interventions:   ?Pt seen this date for skilled ST intervention targeting cognitive-linguistic and speech intelligibility goals outlined in care plan. Pt received in bed, awake/alert. Reports a "rough" weekend with her husband becoming upset with her care during CIR admission. SLP provided supportive listening and validation. Agreeable to ST intervention. ? ?SLP facilitated today's session by providing ongoing education re: recommendations for intermittent supervision with iADL's post-discharge, use of compensatory speech intelligibility strategies to aid in comprehensibility of intended message, use of augmentative communication methods (writing, gesturing, and spelling it out, drawing), use of EMST, fatigue management/energy conservation, and continued practice of single words and short phrases throughout the day with accompanying hand and head gesture (saying "yes" with head nod vs head nod in isolation without verbalization). Pt verbalized understanding of recommendations via teach back. Pt completed 10 repetitions with EMST with improving effort and accuracy. Pt with continued aphonia; however, when provided with positive reinforcement and corrective feedback for use of diaphragmatic breathing, pt improved from 40% intelligible to 55-60% at the word level. Pt reports her vocal quality appears worse in the morning, and is unsure why. Will benefit from ENT evaluation at discharge. Additionally, completed functional math calculations with 80% accuracy with Mod I.  ? ?Pt left in bed with direct hand off to PT. Discharge from current ST POC in light of upcoming hospital discharge. ? ?Patient has met 3 of 3 long term goals.  Patient to discharge at overall  Supervision level.  ?Reasons goals not met: N/A  ? ?Clinical Impression/Discharge Summary: Pt has made steady progress during CIR admission as evident by meeting 3 out of 3 of her long-term goals. Pt continues to present with moderate-severe dysarthria secondary to decreased breath control, pacing, imprecise articulation, and aphonia. Pt has demonstrated subtle gains in her breath support and phonation with use of compensatory speech intelligibility strategies and EMST; unable to activate IMST during admission. Word and phrase level productions are the most pronounced re: improvement, with pt intermittently achieving 60-70% intelligibility; utilizes hand gestures and verbal spelling to aid in comprehension with communication breakdown at this level. At the sentence and conversational level, pt continues to utilize writing to convey her message.   ? ?Cognitive-linguistic skills have been addressed via functional iADL tasks (medication, verbal problem-solving, sequencing, and money management), which pt has completed at an overall Sup  A with min errors. Pt appears to be presenting at her cognitive baseline, which she and her husband endorse, as well.  ? ?Pt and family education completed. Recommend ongoing ST intervention, post-discharge, via [facility] or OP services to target ongoing dysarthria, a well as ENT evaluation to assess vocal cord movement/function. Additionally, recommend intermittent supervision and assistance with complex iADL tasks, particularly when fatigued. ? ?Care Partner:  ?Caregiver Able to Provide Assistance: Yes  ?Type of Caregiver Assistance: Physical;Cognitive ? ?Recommendation:  ?Home Health SLP;Outpatient SLP (intermittent supervision)  ?Rationale for SLP Follow Up: Maximize functional communication;Reduce caregiver burden  ? ?Equipment: N/A  ? ?Reasons for discharge: Discharged from hospital  ? ?Patient/Family Agrees with Progress Made and Goals Achieved: Yes  ? ? ?Deidrick Rainey A Khadir Roam ?06/17/2021,  12:54 PM ? ?

## 2021-06-17 NOTE — Progress Notes (Signed)
Occupational Therapy Discharge Summary ? ?Patient Details  ?Name: Gwendolyn Morales ?MRN: [id number] ?Date of Birth: 12/11/1965 ? ?Today's Date: 06/17/2021 ?OT Individual Time: 9767-3419 ?OT Individual Time Calculation (min): 75 min  ? ?OT conducted comprehensive discharge training this session with pt and husband present. Pt up in power seating upon OT arrival after training initiated earlier this day with PT. Pt and husband report that appointment information for [facility] to conduct a power seating evaluation once home has been provided and will be initiated. Pt's husband brought new Hoyer sling in to request review of safe and effective use and OT provided demonstration and answered all questions presented. Pt motivated to complete full self care routine power w/c level sink side including hair washing and drying, UB/LB bathing and dressing excluding peri care due to just having been changed and cleaned and incontinence brief dry. See assistance levels on Care Tool and below. OT completed session with pt mobilizing power seating approximately 200 feet out of room to therapy gym and within for obstacle negotiation, scanning and safety with close S and min vc's at slowest speed level. OT completed session with simple HEP training with yellow (light) and red (medium) theraband program tied to base of bed for scap retraction/protraction, sh flexion/ext, elbow flexion/ext and compensatory strategies for L UE grip deficits. Husband and pt report and demonstrated positive teach back and understanding with all information provided and agree to OT inpatient rehab discharge this visit. Pt left with husband in power w/c with chair alarm, call bell and needs in place.  ? ? ?Patient has met 3 of 8 long term goals due to improved activity tolerance, improved balance, postural control, ability to compensate for deficits, functional use of  LEFT upper and LEFT lower extremity, improved attention, and improved coordination.   Patient to discharge at [facility] Max Assist level.  Patient's care partner is independent to provide the necessary physical assistance at discharge.   ? ?Reasons goals not met: Pt has made maximal functional gains in current setting. Husband has been trained in use of [place] lift and has all DME in home. Pt will continue to require significant assist for LB self care and mobility due to L residual hemiplegia. Therapy has initiated power w/c training with pt and family and specialized power w/c assessment to occur once home.  ? ?Recommendation:  ?Patient will benefit from ongoing skilled OT services in home health setting to continue to advance functional skills in the area of BADL, iADL, and Reduce care partner burden. ? ?Equipment: ?Hoyer lift, sling, hospital bed, bedside commode and shower DME  ? ?Reasons for discharge: treatment goals met ? ?Patient/family agrees with progress made and goals achieved: Yes ? 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  ?Vital Signs ?  ?Pain ?Pain Assessment ?Pain Scale: 0-10 ?Pain Score: 0-No pain ?ADL ?ADL ?Eating: Set up ?Where Assessed-Eating: Wheelchair ?Grooming: Setup, Supervision/safety ?Where Assessed-Grooming: Wheelchair, Sitting at sink ?Upper Body Bathing: Moderate assistance ?Where Assessed-Upper Body Bathing: Sitting at sink, Wheelchair ?Lower Body Bathing: Maximal assistance ?Where Assessed-Lower Body Bathing: Sitting at sink, Wheelchair ?Upper Body Dressing: Minimal assistance ?Where Assessed-Upper Body Dressing: Sitting at sink, Wheelchair ?Lower Body Dressing: Maximal assistance ?Where Assessed-Lower Body Dressing: Sitting at sink, Wheelchair ?Toileting: Dependent ?Where Assessed-Toileting: Bed level ?Toilet Transfer: Dependent ?Toilet Transfer Method: Other (comment) (use of [place] lift) ?Toilet Transfer  Equipment: Drop arm bedside commode ?Tub/Shower Transfer  Method:  (use of Clarise Cruz lift) ?Walk-In Shower Transfer: Dependent ?Walk-In Shower Transfer Method: Other (comment) Clarise Cruz lift) ?Walk-In Shower Equipment: Civil engineer, contracting with back, Grab bars ?Vision ?Baseline Vision/History: 0 No visual deficits ?Patient Visual Report: No change from baseline ?Vision Assessment?: No apparent visual deficits ?Eye Alignment: Within Functional Limits ?Ocular Range of Motion: Within Functional Limits ?Alignment/Gaze Preference: Within Defined Limits ?Tracking/Visual Pursuits: Able to track stimulus in all quads without difficulty ?Saccades: Additional head turns occurred during testing;Additional eye shifts occurred during testing ?Convergence: Within functional limits ?Visual Fields: Left visual field deficit ?Additional Comments: compensates and is now able to navigate power seating on slow level in wide hallways and oopen room spaces ?Perception  ?Perception: Within Functional Limits ?Inattention/Neglect: Does not attend to left side of body ?Praxis ?Praxis: Intact ?Cognition ?Cognition ?Overall Cognitive Status: Impaired/Different from baseline ?Arousal/Alertness: Awake/alert ?Orientation Level: Person;Place;Situation ?Person: Oriented ?Place: Oriented ?Situation: Oriented ?Memory: Appears intact ?Memory Impairment: Retrieval deficit;Decreased recall of new information ?Attention: Selective ?Selective Attention: Appears intact ?Selective Attention Impairment: Functional basic;Verbal basic ?Awareness: Appears intact ?Problem Solving: Impaired ?Problem Solving Impairment: Functional complex;Verbal complex ?Executive Function: Decision Making;Self Correcting;Organizing ?Organizing: Appears intact ?Decision Making: Appears intact ?Decision Making Impairment: Verbal basic ?Self Correcting: Appears intact ?Self Correcting Impairment: Verbal basic ?Safety/Judgment: Appears intact ?Brief Interview for Mental Status (BIMS) ?Repetition of  Three Words (First Attempt): 3 ?Temporal Orientation: Year: Correct ?Temporal Orientation: Month: Accurate within 5 days ?Temporal Orientation: Day: Correct ?Recall: "Sock": Yes, no cue required ?Recall: "Blue": Yes, no cue required ?Recall: "Bed": Yes, no cue required ?BIMS Summary Score: 15 ?Sensation ?Sensation ?Light Touch: Appears Intact ?Hot/Cold: Appears Intact ?Proprioception: Impaired Detail ?Proprioception Impaired Details: Impaired LUE;Impaired LLE ?Stereognosis: Not tested ?Coordination ?Gross Motor Movements are Fluid and Coordinated: No ?Fine Motor Movements are Fluid and Coordinated: No ?Coordination and Movement Description: Movement patterns remain significantly impaired due to global weakness and deconditioning, fear of falling, prior R sided weakness from CVA, body habitus, and moderate anxiety regarding functional mobility ?Finger Nose Finger Test: Impaired by weakness LUE; WNL RUE ?Heel Shin Test: UTA due to weakness bilaterally ?Motor  ?Motor ?Motor: Hemiplegia ?Motor - Skilled Clinical Observations: left hemi ?Motor - Discharge Observations: L hemi with 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 2017 ?Mobility  ?Bed Mobility ?Bed Mobility: Rolling Right;Rolling Left;Sit to Supine;Supine to Sit ?Rolling Right: Moderate Assistance - Patient 50-74% ?Rolling Left: Total Assistance - Patient < 25% ?Supine to Sit: Moderate Assistance - Patient 50-74% ?Sit to Supine: Maximal Assistance - Patient 25-49% ?Transfers ?Sit to Stand: Dependent - mechanical lift ?Stand to Sit: Dependent - mechanical lift  ?Trunk/Postural Assessment  ?Cervical Assessment ?Cervical Assessment: Exceptions to [facility] ?Cervical Strength ?Overall Cervical Strength: Due to premorbid status ?Thoracic Assessment ?Thoracic Assessment: Exceptions to [facility] ?Thoracic Strength ?Overall Thoracic Strength: Due to premorbid status ?Lumbar Assessment ?Lumbar Assessment: Exceptions to [facility] ?Lumbar Strength ?Overall Lumbar Strength Comments: posterior  pelvis in sitting ?Postural Control ?Postural Control: Deficits on evaluation ?Righting Reactions: delayed ?Postural Limitations: forward head, posterior pelvis, ovesity and L hemiplegia  ?Balance ?Balance ?Balance

## 2021-06-17 NOTE — Progress Notes (Addendum)
Patient ID: Gwendolyn Morales, female   DOB: Dec 01, 1965, 56 y.o.   MRN: [id number] Spoke with husband who reports equipment was just delivered to the home. He plans to come up here at 12:00 in case anyone needs to see him and wants to talk with the PA. Asked time for ambulance transport tomorrow and he wants it to be after 4:00, to make sure he is there after work. He is aware how the ambulance can run late and she may not leave until 7-8 pm. He is ok with this. Will work on discharge tomorrow ? ?1:15 PM Touched base while husband wads here to go over discharge services, home health, CAP referral made and ambulance home. Dan-PA did go over DC instructions and medications with husband. Husband wants to be called when pt leaves tomorrow via ambulance will ask bedside RN to do this if this worker is not here. Aware power chair eval will take place at home via [employer]. Pt and husband report all questions have been addressed and he has no other questions. Aware can call worker tomorrow if questions arise. Scheduled PTAR at 4:00 pm. ?

## 2021-06-17 NOTE — Progress Notes (Signed)
Inpatient Rehabilitation Care Coordinator ?Discharge Note  ? ?Patient Details  ?Name: Gwendolyn Morales ?MRN: [id number] ?Date of Birth: 20-Aug-1965 ? ? ?Discharge location: [place] [facility] ? ?Length of Stay: 22 DAYS ? ?Discharge activity level: MOD/MAX LEVEL ? ?Home/community participation: HOMEBOUND ? ?Patient response [id number] Literacy - How often do you need to have someone help you when you read instructions, pamphlets, or other written material from your doctor or pharmacy?: Rarely ? ?Patient response [id number] Isolation - How often do you feel lonely or isolated from those around you?: Sometimes ? ?Services provided included: MD, RD, PT, OT, SLP, RN, CM, TR, Pharmacy, SW ? ?Financial Services:  ?Charity fundraiser Utilized: Private Insurance ?[place] ? ?Choices offered to/list presented to: PT AND HUSBAND ? ?Follow-up services arranged:  ?Home Health, DME, Patient/Family request agency HH/DME ?Home Health Agency: [place], [place]  ?  ?DME : [facility] BED AND [facility] LIFT WILL HAVE POWER WHEELCHAIR EVAL AT HOME-STALLS ?HH/DME Requested Agency: HAS HAD [facility] IN THE PAST ?CAP REFERRAL MADE VIA [place]. [facility] [phone] TRANSPORT INFORMATION GIVEN TO HUSBAND ? ?Patient response to transportation need: ?Is the patient able to respond to transportation needs?: Yes ?In the past 12 months, has lack of transportation kept you from medical appointments or from getting medications?: No ?In the past 12 months, has lack of transportation kept you from meetings, work, or from getting things needed for daily living?: No ? ? ? ?Comments (or additional information): HUSBAND WAS HERE MULTIPLE TIMES FOR EDUCATION-SON CAME ONE TIME. HUSBAND PLANS TO PROVIDE ASSIST AND WILL LEAVE AT 8A BACK AT [place] BY 4P. SON IS NOT COMFORTABLE WITH PERSONAL CARE OF MOM. HUSBAND WILL BE RESPONSIBLE FOR THIS. DISCUSSED  NH OPTION BUT WANTED TO TRY IT AT HOME FIRST. PT  DOES NOT WANT TO GO TO A NH AND WILL NOT NECESSARILY GET THERAPY IN ONE DUE TO MEDICAID COVERAGE ? ?Patient/Family verbalized understanding of follow-up arrangements:  Yes ? ?Individual responsible for coordination of the follow-up plan: ROBERT-HUSBAND [phone] ? ?Confirmed correct DME delivered: [name] 06/17/2021   ? ?[name] ?

## 2021-06-17 NOTE — Plan of Care (Signed)
?  Problem: RH Bathing ?Goal: LTG Patient will bathe all body parts with assist levels (OT) ?Description: LTG: Patient will bathe all body parts with assist levels (OT) ?Outcome: Adequate for Discharge ?  ?Problem: RH Dressing ?Goal: LTG Patient will perform upper body dressing (OT) ?Description: LTG Patient will perform upper body dressing with assist, with/without cues (OT). ?Outcome: Adequate for Discharge ?Goal: LTG Patient will perform lower body dressing w/assist (OT) ?Description: LTG: Patient will perform lower body dressing with assist, with/without cues in positioning using equipment (OT) ?Outcome: Adequate for Discharge ?  ?

## 2021-06-17 NOTE — Discharge Summary (Signed)
Physical Therapy Discharge Summary ? ?Patient Details  ?Name: Gwendolyn Morales ?MRN: [id number] ?Date of Birth: [birth year] ? ?Patient has met 5 of 5 long term goals due to improved activity tolerance, improved balance, improved postural control, increased strength, and ability to compensate for deficits.  Patient to discharge at a wheelchair level Total Assist.   Patient's care partner is independent to provide the necessary physical and cognitive assistance at discharge. ? ?Reasons goals not met: Goals were downgraded and some were discharged. See POC note for details. Wheelchair at supervision level with power wheelchair and up to 53f in manual wheelchair.  ? ?Recommendation:  ?Patient will benefit from ongoing skilled PT services in home health setting to continue to advance safe functional mobility, address ongoing impairments in caregiver training, functional mobility, bed mobility, functional transfers, power wheelchair training, fall prevention, general strengthening, and minimize fall risk. ? ?Equipment: ?Hospital Bed, [employer] Pt owns a manual wheelchair. Coordinated with SCarolina Shoresto provide Power Wheelchair consult at home. Demographics have been sent. ? ?Reasons for discharge: treatment goals met and discharge from hospital ? ?Patient/family agrees with progress made and goals achieved: Yes ? 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 Occasionally ?Pain Interference with Therapy Activities: 1. Rarely or not at all ?Pain Interference with Day-to-Day Activities: 1. Rarely or not at all ?Vision/Perception  ?Vision - History ?Ability to See in Adequate Light: 0 Adequate ?Vision - Assessment ?Eye Alignment: Within Functional Limits ?Ocular Range of Motion: Within Functional  Limits ?Alignment/Gaze Preference: Within Defined Limits ?Tracking/Visual Pursuits: Able to track stimulus in all quads without difficulty ?Convergence: Within functional limits ?Perception ?Perception: Within Functional Limits ?Praxis ?Praxis: Intact  ?Cognition ?Overall Cognitive Status: Impaired/Different from baseline ?Arousal/Alertness: Awake/alert ?Orientation Level: Oriented X4 ?Attention: Selective ?Selective Attention: Appears intact ?Memory: Appears intact ?Awareness: Appears intact ?Problem Solving: Impaired ?Problem Solving Impairment: Functional complex;Verbal complex ?Decision Making: Impaired ?Decision Making Impairment: Verbal complex;Functional complex ?Safety/Judgment: Appears intact ?Sensation ?Sensation ?Light Touch: Appears Intact ?Hot/Cold: Appears Intact ?Proprioception: Impaired Detail ?Proprioception Impaired Details: Impaired LUE;Impaired LLE ?Stereognosis: Not tested ?Coordination ?Gross Motor Movements are Fluid and Coordinated: No ?Fine Motor Movements are Fluid and Coordinated: No ?Coordination and Movement Description: Movement patterns remain significantly impaired due to global weakness and deconditioning, fear of falling, prior R sided weakness from CVA, body habitus, and moderate anxiety regarding functional mobility ?Heel Shin Test: UTA due to weakness bilaterally ?Motor  ?Motor ?Motor: Hemiplegia ?Motor - Discharge Observations: L hemi with 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 2017  ?Mobility ?Bed Mobility ?Bed Mobility: Rolling Right;Rolling Left;Sit to Supine;Supine to Sit ?Rolling Right: Moderate Assistance - Patient 50-74% ?Rolling Left: Total Assistance - Patient < 25% ?Supine to Sit: Moderate Assistance - Patient 50-74% (with hospital bed features) ?Sit to Supine: Maximal Assistance - Patient 25-49% ?Transfers ?Transfers: Sit to Stand;Stand to Sit;Transfer via [place]?Sit to Stand: Dependent - mechanical lift (Clarise CruzPlus) ?Stand to Sit: Dependent - mechanical lift  (Clarise CruzPlus) ?Stand Pivot Transfers: Dependent - mechanical lift ([place] vs SClarise CruzPlus) ?Transfer via Lift Equipment:  (Clarise CruzPlus, HCivil Service fast streamer ?Locomotion  ?Gait ?Ambulation: No ?Gait ?Gait: No ?Stairs / Additional Locomotion ?Stairs: No ?Pick up small object from the floor (from standing position) activity did not occur: Safety/medical concerns ?Wheelchair Mobility ?Wheelchair Mobility: Yes ?Wheelchair Assistance: Supervision/Verbal cueing ?Wheelchair Propulsion: Power ?Wheelchair Parts Management: Needs assistance ?Distance: 1545f ?  Trunk/Postural Assessment  ?Cervical Assessment ?Cervical Assessment: Exceptions to [facility] (forward head) ?Thoracic Assessment ?Thoracic Assessment: Exceptions to [facility] (rounded shoulders) ?Lumbar Assessment ?Lumbar Assessment: Exceptions to [facility] (posterior pelvic tilt) ?Postural Control ?Postural Control: Deficits on evaluation ?Righting Reactions: delayed  ?Balance ?Balance ?Balance Assessed: Yes ?Static Sitting Balance ?Static Sitting - Balance Support: No upper extremity supported;Feet supported ?Static Sitting - Level of Assistance: 5: Stand by assistance ?Dynamic Sitting Balance ?Dynamic Sitting - Balance Support: During functional activity;Feet supported;No upper extremity supported ?Dynamic Sitting - Level of Assistance: 4: Min assist;3: Mod assist ?Extremity Assessment  ?  ?  ?RLE Assessment ?RLE Assessment: Exceptions to [facility] ?RLE Strength ?RLE Overall Strength: Deficits ?Right Hip Flexion: 3-/5 ?Right Hip Extension: 3-/5 ?Right Hip ABduction: 3+/5 ?Right Knee Flexion: 3+/5 ?Right Knee Extension: 3+/5 ?Right Ankle Dorsiflexion: 4/5 ?LLE Assessment ?LLE Assessment: Exceptions to [facility] ?LLE Strength ?LLE Overall Strength: Deficits ?Left Hip Flexion: 2-/5 ?Left Hip Extension: 2/5 ?Left Hip ABduction: 2-/5 ?Left Hip ADduction: 2-/5 ?Left Knee Flexion: 2-/5 ?Left Knee Extension: 2-/5 ?Left Ankle Dorsiflexion: 2-/5 ?Left Ankle Plantar Flexion: 2-/5 ? ? ? ?Alger Simons PT ?06/17/2021,  12:24 PM ?

## 2021-06-17 NOTE — Progress Notes (Signed)
Physical Therapy Session Note ? ?Patient Details  ?Name: Gwendolyn Morales ?MRN: [id number] ?Date of Birth: 03-31-1965 ? ?Today's Date: 06/17/2021 ?PT Individual Time: 8830-1415 ?PT Individual Time Calculation (min): 60 min  ? ?Short Term Goals: ?Week 3:  PT Short Term Goal 1 (Week 3): STG = LTG due to ELOS ? ?Skilled Therapeutic Interventions/Progress Updates:  ?   ?Pt received supine in bed - agreeable to PT tx. Denies pain. Focused session on DC planning, home safety training, and DME recommendations. Provided her with [place] card and had discussion with ATP from [facility], regarding power wheelchair needs - emailed facesheet for demographics. Retrieved front wheeled [place] from [employer]. Completed bed mobility with modA with hospital bed features. Sitting balance supervision and completed mechanical lift Sara Plus transfer from EOB to [facility] - required +2 assist for repositioning in w/c to get her hips posteriorly. Educated her on [place] functions (power on/off, reclining function, Charity fundraiser, etc). She drove herself with supervision in [place] >158f on lowest speed setting for safety. Practiced driving in figure-8 formation as well as forward/backward driving ~~9RH All completed with supervision and min cues for problem solving, progressing to no cues with repetition. Pt [facility] >1557f Remained slightly reclined in PWC with call bell in reach, PWC turned off, all needs met.  ? 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  ? ?Therapy/Group: Individual Therapy ? ?Enna Warwick P Muaad Boehning ?06/17/2021, 7:23 AM  ?

## 2021-06-18 NOTE — Progress Notes (Signed)
Inpatient Rehabilitation Discharge Medication Review by a Pharmacist ? ?A complete drug regimen review was completed for this patient to identify any potential clinically significant medication issues. ? ?High Risk Drug Classes Is patient taking? Indication by Medication  ?Antipsychotic No   ?Anticoagulant No   ?Antibiotic No   ?Opioid No   ?Antiplatelet No   ?Hypoglycemics/insulin No   ?Vasoactive Medication Yes Hydralazine, metoprolol for BP  ?Chemotherapy No   ?Other Yes Atorvastatin for HLD ?Trazodone for sleep  ? ? ? ?Type of Medication Issue Identified Description of Issue Recommendation(s)  ?Drug Interaction(s) (clinically significant) ?    ?Duplicate Therapy ?    ?Allergy ?    ?No Medication Administration End Date ?    ?Incorrect Dose ?    ?Additional Drug Therapy Needed ?    ?Significant med changes from prior encounter (inform family/care partners about these prior to discharge).    ?Other ?    ? ? ?Clinically significant medication issues were identified that warrant physician communication and completion of prescribed/recommended actions by midnight of the next day:  No ? ?Pharmacist comments: None ? ?Time spent performing this drug regimen review (minutes):  20 minutes ? ? ?Elwin Sleight ?06/18/2021 8:17 AM ?

## 2021-06-18 NOTE — Progress Notes (Signed)
?                                                       PROGRESS NOTE ? ? ?Subjective/Complaints: ?Asks for new left resting hand splint as the velcro strap on hers is not working- ordered ?She asks if her iron levels are normal and if she still needs iron supplement ? ?ROS: Patient denies fever, rash, sore throat, blurred vision, dizziness, nausea, vomiting, diarrhea, cough, shortness of breath or chest pain, joint or back/neck pain, headache, or mood change.  +right knee pain, +left sided weakness ? ?Objective: ?  ?No results found. ?Recent Labs  ?  06/17/21 ?1506  ?WBC 8.6  ?HGB 14.8  ?HCT 45.2  ?PLT 354  ? ? ? ? ?Recent Labs  ?  06/17/21 ?1506  ?NA 141  ?K 4.1  ?CL 110  ?CO2 23  ?GLUCOSE 95  ?BUN 19  ?CREATININE 1.02*  ?CALCIUM 10.3  ? ? ? ? ? ?Intake/Output Summary (Last 24 hours) at 06/18/2021 1128 ?Last data filed at 06/18/2021 0808 ?Gross per 24 hour  ?Intake 952 ml  ?Output --  ?Net 952 ml  ?  ? ?  ? ?Physical Exam: ?Vital Signs ?Blood pressure 138/78, pulse 63, temperature 98.5 ?F (36.9 ?C), temperature source Oral, resp. rate 16, height 5\' 10"  (1.778 m), weight 106 kg, SpO2 98 %. ?Gen: no distress, normal appearing, BMI 33.53 ? ?Constitutional: No distress . Vital signs reviewed. obese ?HEENT: NCAT, EOMI, oral membranes moist ?Neck: supple ?Cardiovascular: RRR without murmur. No JVD    ?Respiratory/Chest: CTA Bilaterally without wheezes or rales. Normal effort    ?GI/Abdomen: BS +, non-tender, non-distended ?Ext: no clubbing, cyanosis, or edema ?Psych: pleasant and cooperative  ?Skin: minimal bleeding from injection site.  ?Sensory exam normal sensation to light touch and proprioception in bilateral upper and lower extremities ?Hypophonic dysarthria ?Musculoskeletal: Full range of motion in all 4 extremities. No joint swelling. Tight levator scapulae bilaterally ?Right knee TTP, TTP in her left shoulder ? Mental Status: She is alert.  ?   Sensory: Sensory deficit present.  ?   Motor: Weakness (mild left  sided weakness after therapy in 2017--worse now) present.  ?   Comments: Left facial paresis with severe dysarthria with low voice volume--no change--is comprehensible. 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  ?MinA bed mobility ?Using power wheelchair with S ?  ? ? ? ?Assessment/Plan: ?1. Functional deficits which require 3+ hours per day of interdisciplinary therapy in a comprehensive inpatient rehab setting. ?Physiatrist is providing close team supervision and 24 hour management of active medical problems listed below. ?Physiatrist and rehab team continue to assess barriers to discharge/monitor patient progress toward functional and medical goals ? ?Care Tool: ? ?Bathing ?   ?Body parts bathed by patient: Chest, Abdomen, Left arm, Right upper leg, Left upper leg, Face  ? Body parts bathed by helper: Buttocks, Front perineal area, Right arm, Right lower leg, Left lower leg ?  ?  ?Bathing assist Assist Level: Maximal Assistance - Patient 24 - 49% ?  ?  ?Upper Body Dressing/Undressing ?Upper body dressing   ?What is the patient wearing?: Pull over shirt, Dress ?   ?Upper body assist Assist Level: Moderate Assistance - Patient 50 - 74% ?   ?Lower Body Dressing/Undressing ?Lower body dressing ? ? ?   ?  What is the patient wearing?: Skirt ? ?  ? ?Lower body assist Assist for lower body dressing: Maximal Assistance - Patient 25 - 49% ?   ? ?Toileting ?Toileting    ?Toileting assist Assist for toileting: Dependent - Patient 0% ?  ?  ?Transfers ?Chair/bed transfer ? ?Transfers assist ?   ? ?Chair/bed transfer assist level: Dependent - mechanical lift ?  ?  ?Locomotion ?Ambulation ? ? ?Ambulation assist ? ? Ambulation activity did not occur: Safety/medical concerns ? ?  ?  ?   ? ?Walk 10 feet activity ? ? ?Assist ? Walk 10 feet activity did not occur: Safety/medical concerns ? ?  ?   ? ?Walk 50 feet activity ? ? ?Assist Walk 50 feet with 2 turns activity did not occur: Safety/medical concerns ? ?  ?   ? ? ?Walk 150  feet activity ? ? ?Assist Walk 150 feet activity did not occur: Safety/medical concerns ? ?  ?  ?  ? ?Walk 10 feet on uneven surface  ?activity ? ? ?Assist Walk 10 feet on uneven surfaces activity did not occur: Safety/medical concerns ? ? ?  ?   ? ?Wheelchair ? ? ? ? ?Assist Is the patient using a wheelchair?: Yes ?Type of Wheelchair: Power ?  ? ?Wheelchair assist level: Supervision/Verbal cueing ?Max wheelchair distance: 11150ft  ? ? ?Wheelchair 50 feet with 2 turns activity ? ? ? ?Assist ? ?  ?  ? ? ?Assist Level: Supervision/Verbal cueing  ? ?Wheelchair 150 feet activity  ? ? ? ?Assist ?   ? ? ?Assist Level: Supervision/Verbal cueing  ? ?Blood pressure 138/78, pulse 63, temperature 98.5 ?F (36.9 ?C), temperature source Oral, resp. rate 16, height 5\' 10"  (1.778 m), weight 106 kg, SpO2 98 %. ? ?Medical Problem List and Plan: ?1. 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 ?            -patient may shower ?            -ELOS/Goals: 14-20d Min/Mod A ? HFU scheduled ? D/c home today ? 2 [place] ? Hoyer lift ordered ?2.  Antithrombotics: ?-DVT/anticoagulation:  Pharmaceutical: Lovenox ?            -antiplatelet therapy: ASA/Plavix X 3 weeks followed by ASA alone.  ?3. Shoulder pain: continue tylenol prn. continuescheduled voltaren gel to QID. Kpad ordered. Discussed stronger pain medications but she defers.  ?4. Anxiety: discussed scheduling hydroxyzine but she defers given side effect profile. Discussed selective serotonin reuptake inhibitors but she refuses due to potential side effects. Discussed relaxation tehcniques, continue these.  ?5. Neuropsych: This patient is capable of making decisions on her own behalf. ?6. Skin/Wound Care: Routine pressure relief measures.  ?7.  Fluids/Electrolytes/Nutrition: Monitor I/O. ?8. HTN: Monitor BP TID. Well controlled, continue Norvasc 10 mg/day, hydralazine 25 mg every 8 hours, and metoprolol 100 mg BID. Supplement klor which will also help with HTN. Increase magnesium gluconate to 500mg  HS. D/c hctz given good control and bump in creatinine. Discussed with patient.  ?  ?Vitals:  ? 06/17/21 1936 06/18/21 0500  ?BP: 130/81 138/78  ?Pulse: 62 63  ?Resp: 18 16  ?Temp: 97.8 ?F (36.6 ?C) 98.5 ?F (36.9 ?C)  ?SpO2: 98% 98%  ? ? ? 10. CKD III: Monitor with routine checks. Recommended drinking 6-8  glasses of water per day. D/c HCTZ given worsening renal function. Creatinine recently improved, monitor weekly ?11. E coli UTI: Ceftriaxone started on 04/12 with resolution of leucocytosis.  ?12. Elevated LDL: continue high dose Lipitor  ?13. Chronic intermittent cough: Conitnue Mucinex BID ?14. Constipation: Will start miralax bid. Increase magnesium gluconate to 500 mg HS- type 6 stool on 5/6 ?15. Undiagnosed OSA: To be started in sleep study per husband. ?16. Obesity: BMI 33.56: provide dietary education ?17. Hypokalemia: supplement klor.   ?18. Right knee pain: XR shows severe patellofemoral arthritis. Lidocaine patch ordered. Apply voltaren gel, improved, continue this regimen ?19. Aphonic: continue SLP. Outpatient ENT follow-up. Discussed with patient. Respiratory muscle training ?20. ?OSA: will need to follow-up with outpatient pulmonologist ?21. Bradycardia:resolved ?22. Fear of falling: provided emotional support.  ?23. Flexor tone, mild, left side. continuekpad.  ?24. Cognitive impairment: continue SLP outpatient with emphasis on error awareness.  ?25. Cervical myofascial syndrome: Trigger Point Injections provided some relief on right side, discussed repeating on left but she defers ?26. Loss of bowel or bladder sensation: lumbar XR ordered-   it shows facet degeneration, no nerve root compression ?27. Iron deficiency anemia: d/c iron supplement as  per patient preference, discussed natural sources of iron in foods.  ?28. Left sided weakness: new resting hand splint ordered since current has lost its velcro.  ? ? >30 minutes spent in discharge of patient including review of

## 2021-06-18 NOTE — Progress Notes (Signed)
Orthopedic Tech Progress Note ?Patient Details:  ?Gwendolyn Morales ?1965/09/21 ?[id number] ? ?Called in order to [employer] for a RESTING HAND SPLINT  ? ?Patient ID: Gwendolyn Morales, female   DOB: 13-Nov-1965, 56 y.o.   MRN: [id number] ? ?Gwendolyn Morales ?06/18/2021, 11:56 AM ? ?

## 2021-06-18 NOTE — Progress Notes (Signed)
Patient ID: Gwendolyn Morales, female   DOB: Jun 17, 1965, 56 y.o.   MRN: [id number] Spoke with PTAR to see where pt was on their list and she is number 10. Have informed bedside RN-Gray and called husband to let him know. He still wants to be called when they are here to get her and bedside RN is aware of this. Aware may be late tonight she leaves. ?

## 2021-06-18 NOTE — Plan of Care (Signed)
?  Problem: RH Expression Communication ?Goal: LTG Patient will increase speech intelligibility (SLP) ?Description: LTG: Patient will increase speech intelligibility at word/phrase/conversation level with cues, % of the time (SLP) ?Outcome: Completed/Met ?  ?Problem: RH Problem Solving ?Goal: LTG Patient will demonstrate problem solving for (SLP) ?Description: LTG:  Patient will demonstrate problem solving for basic/complex daily situations with cues  (SLP) ?Outcome: Completed/Met ?  ?Problem: RH Attention ?Goal: LTG Patient will demonstrate this level of attention during functional activites (SLP) ?Description: LTG:  Patient will will demonstrate this level of attention during functional activites (SLP) ?Outcome: Completed/Met ?  ?

## 2021-06-27 ENCOUNTER — Telehealth: Payer: Self-pay

## 2021-06-27 NOTE — Telephone Encounter (Addendum)
Prior Authorization for  Lidocaine 5% has been submitted in Cover My Meds.  Per fax [employer] [facility] approved 06/27/2021 to 06/28/2022. Information faxed to  [facility] on [address] in [place].

## 2021-07-11 ENCOUNTER — Encounter
Payer: Medicaid Other | Attending: Physical Medicine and Rehabilitation | Admitting: Physical Medicine and Rehabilitation

## 2021-07-11 ENCOUNTER — Encounter: Payer: Self-pay | Admitting: Physical Medicine and Rehabilitation

## 2021-07-11 VITALS — BP 132/82 | HR 70

## 2021-07-11 DIAGNOSIS — M25561 Pain in right knee: Secondary | ICD-10-CM | POA: Diagnosis present

## 2021-07-11 DIAGNOSIS — I639 Cerebral infarction, unspecified: Secondary | ICD-10-CM

## 2021-07-11 DIAGNOSIS — S43002S Unspecified subluxation of left shoulder joint, sequela: Secondary | ICD-10-CM | POA: Diagnosis present

## 2021-07-11 DIAGNOSIS — R498 Other voice and resonance disorders: Secondary | ICD-10-CM | POA: Diagnosis present

## 2021-07-11 MED ORDER — HYDROXYZINE HCL 25 MG PO TABS: 25.0000 mg | ORAL_TABLET | Freq: Three times a day (TID) | ORAL | 3 refills | Status: DC | PRN

## 2021-07-11 MED ORDER — AMLODIPINE BESYLATE 10 MG PO TABS: 10.0000 mg | ORAL_TABLET | Freq: Every day | ORAL | 3 refills | Status: AC

## 2021-07-11 MED ORDER — DICLOFENAC SODIUM 1 % EX GEL: 2.0000 g | Freq: Four times a day (QID) | CUTANEOUS | 11 refills | Status: AC

## 2021-07-11 MED ORDER — GABAPENTIN 100 MG PO CAPS: 100.0000 mg | ORAL_CAPSULE | Freq: Every day | ORAL | 3 refills | Status: AC

## 2021-07-11 MED ORDER — MAGNESIUM GLUCONATE 500 MG PO TABS: 500.0000 mg | ORAL_TABLET | Freq: Every day | ORAL | 3 refills | Status: AC

## 2021-07-11 MED ORDER — ATORVASTATIN CALCIUM 80 MG PO TABS: 80.0000 mg | ORAL_TABLET | Freq: Every day | ORAL | 3 refills | Status: DC

## 2021-07-11 MED ORDER — METOPROLOL TARTRATE 75 MG PO TABS: 75.0000 mg | ORAL_TABLET | Freq: Two times a day (BID) | ORAL | 3 refills | Status: AC

## 2021-07-11 MED ORDER — LIDOCAINE 5 % EX PTCH: 1.0000 | MEDICATED_PATCH | CUTANEOUS | 3 refills | Status: AC

## 2021-07-11 MED ORDER — GUAIFENESIN ER 600 MG PO TB12: 600.0000 mg | ORAL_TABLET | Freq: Two times a day (BID) | ORAL | 3 refills | Status: AC

## 2021-07-11 NOTE — Progress Notes (Signed)
Subjective:    Patient ID: Gwendolyn Morales, female    DOB: 1965-04-25, 56 y.o.   MRN: [id number]  HPI Mrs. Barish is a 56 yearold man who presents for hospital follow-up after CIR admission for stroke  1) CVA -home nurse is trying to order the cream colored gels to prevent bed sores -wheelchair is being custom built.  -right now her lift and hospital bed were dnied -husband is concerned that the harness is being her head too forward.  -husband asks if manual sit and stand would be an option   Pain Inventory Average Pain 5 Pain Right Now 4 My pain is sharp and aching  LOCATION OF PAIN  neck, shoulder, elbow, hand, knee, leg  BOWEL Number of stools per week: 12 Oral laxative use No  Type of laxative . Enema or suppository use No  History of colostomy No  Incontinent Yes   BLADDER Pads In and out cath, frequency . Able to self cath  . Bladder incontinence Yes  Frequent urination Yes  Leakage with coughing No  Difficulty starting stream No  Incomplete bladder emptying No    Mobility how many minutes can you walk? 0 ability to climb steps?  no do you drive?  no use a wheelchair needs help with transfers  Function not employed: date last employed 2017 disabled: date disabled 07/2015 I need assistance with the following:  dressing, bathing, toileting, meal prep, household duties, and shopping  Neuro/Psych bladder control problems bowel control problems trouble walking anxiety  Prior Studies Hospital f/u  Physicians involved in your care Hospital f/u   Family History  Problem Relation Age of Onset   Diabetes Mother    Hypertension Sister    Social History   Socioeconomic History   Marital status: Married    Spouse name: [name]   Number of children: 2   Years of education: Not on file   Highest education level: Not on file  Occupational History   Not on file  Tobacco Use   Smoking status: Never   Smokeless tobacco: Never  Substance and  Sexual Activity   Alcohol use: No   Drug use: No   Sexual activity: Not on file  Other Topics Concern   Not on file  Social History Narrative   Lives with husband, child   Social Determinants of Health   Financial Resource Strain: Not on file  Food Insecurity: Not on file  Transportation Needs: Not on file  Physical Activity: Not on file  Stress: Not on file  Social Connections: Not on file   Past Surgical History:  Procedure Laterality Date   BUBBLE STUDY  05/24/2021   Procedure: BUBBLE STUDY;  Surgeon: Quintella Reichert, MD;  Location: [facility] ENDOSCOPY;  Service: Cardiovascular;;   CESAREAN SECTION     x 2   TEE WITHOUT CARDIOVERSION N/A 05/24/2021   Procedure: TRANSESOPHAGEAL ECHOCARDIOGRAM (TEE);  Surgeon: Quintella Reichert, MD;  Location: [facility] ENDOSCOPY;  Service: Cardiovascular;  Laterality: N/A;   Past Medical History:  Diagnosis Date   Hypertension    Stroke (HCC) 07/2015   ICH with residual hemiparesis   BP 132/82   Pulse 70   SpO2 96%   Opioid Risk Score:   Fall Risk Score:  `1  Depression screen [facility] 2/9     07/11/2021    9:53 AM 10/30/2015    2:23 PM  Depression screen PHQ 2/9  Decreased Interest 3 0  Down, Depressed, Hopeless 1 1  PHQ - 2  Score 4 1  Altered sleeping 1   Tired, decreased energy 1   Change in appetite 1   Feeling bad or failure about yourself  1   Trouble concentrating 1   Moving slowly or fidgety/restless 1   Suicidal thoughts 0   PHQ-9 Score 10   Difficult doing work/chores Somewhat difficult     Review of Systems  Gastrointestinal:  Positive for diarrhea.  Skin:  Positive for rash.  All other systems reviewed and are negative.     Objective:   Physical Exam Gen: no distress, normal appearing HEENT: oral mucosa pink and moist, NCAT Cardio: Reg rate Chest: normal effort, normal rate of breathing Abd: soft, non-distended Ext: no edema Psych: pleasant, normal affect Skin: intact Neuro: Hypophonic speech Musculoskeletal: resting  in stretcher, left shoulder subluxation.      Assessment & Plan:  1) CVA -continue therapies -would benefit from handicap placard to increase mobility in the community -reviewed all medications and provided necessary refills. -discussed vagal nerve stimulation if strength fails to improve in 6 months.  -assessed for spasticity -continue centrum.   2) Left shoulder subluxation: --continue sling prn -continue therapy  3) Hyophonia: -continue writing.  -continue SLP  4) Right knee pain -continue voltaren gel prn -continue lidocaine patches  5) HTN -refilled magnesium and lopressor  6) Post-stroke spasticity: -continue range of motion   7) Insomnia: -Try to go outside near sunrise -Get exercise during the day.  -Turn off all devices an hour before bedtime.  -Teas that can benefit: chamomile, valerian root, Brahmi (Bacopa) -Can consider over the counter melatonin, magnesium, and/or L-theanine. Melatonin is an anti-oxidant with multiple health benefits. Magnesium is involved in greater than 300 enzymatic reactions in the body and most of [place] are deficient as our soil is often depleted. There are 7 different types of magnesium- Bioptemizer's is a supplement with all 7 types, and each has unique benefits. Magnesium can also help with constipation and anxiety.  -Pistachios naturally increase the production of melatonin -[employer]zy [employer] bamboo bed sheets are free from toxic chemicals.  -Tart cherry juice or a tart cherry supplement can improve sleep and soreness post-workout

## 2021-07-11 NOTE — Patient Instructions (Signed)

## 2021-07-22 ENCOUNTER — Inpatient Hospital Stay: Payer: Medicaid Other | Admitting: Neurology

## 2021-07-30 ENCOUNTER — Telehealth: Payer: Self-pay

## 2021-07-30 NOTE — Telephone Encounter (Signed)
PA submitted for Lidocaine patches through CoverMyMeds

## 2021-07-30 NOTE — Telephone Encounter (Signed)
Patient's husband called stating the patient is out of the Hydralazine. He thought it was sent in with refills for a year like the others. Does it need to be sent in?

## 2021-07-30 NOTE — Telephone Encounter (Signed)
Lidocaine patches were approved 06/27/21 per insurance

## 2021-07-31 ENCOUNTER — Other Ambulatory Visit: Payer: Self-pay | Admitting: Physical Medicine and Rehabilitation

## 2021-07-31 MED ORDER — HYDRALAZINE HCL 25 MG PO TABS: 25.0000 mg | ORAL_TABLET | Freq: Three times a day (TID) | ORAL | 0 refills | Status: DC

## 2021-07-31 NOTE — Telephone Encounter (Signed)
Spoke with patient's husband and he stated her blood pressure is running around 150/95. He stated that is the highest it has been running and the lowest was 129/94. He is going to bring a list of her blood pressures to the clinic tomorrow

## 2021-09-13 ENCOUNTER — Other Ambulatory Visit: Payer: Self-pay | Admitting: Physical Medicine and Rehabilitation

## 2021-09-30 ENCOUNTER — Inpatient Hospital Stay: Payer: Medicaid Other | Admitting: Neurology

## 2021-10-07 ENCOUNTER — Other Ambulatory Visit: Payer: Self-pay | Admitting: Physical Medicine and Rehabilitation

## 2021-10-16 ENCOUNTER — Inpatient Hospital Stay: Payer: Medicaid Other | Admitting: Neurology

## 2021-11-24 ENCOUNTER — Other Ambulatory Visit: Payer: Self-pay | Admitting: Physical Medicine and Rehabilitation

## 2022-01-13 ENCOUNTER — Encounter
Payer: Medicaid Other | Attending: Physical Medicine and Rehabilitation | Admitting: Physical Medicine and Rehabilitation

## 2022-02-05 ENCOUNTER — Inpatient Hospital Stay: Payer: Medicaid Other | Admitting: Neurology

## 2023-03-16 ENCOUNTER — Encounter (HOSPITAL_COMMUNITY): Payer: Self-pay

## 2023-03-16 ENCOUNTER — Emergency Department (HOSPITAL_COMMUNITY)
Admission: EM | Admit: 2023-03-16 | Discharge: 2023-03-17 | Disposition: A | Discharge status: Home / Self Care | Payer: Medicaid Other | Attending: Emergency Medicine | Admitting: Emergency Medicine

## 2023-03-16 ENCOUNTER — Emergency Department (HOSPITAL_COMMUNITY): Payer: Medicaid Other

## 2023-03-16 ENCOUNTER — Other Ambulatory Visit: Payer: Self-pay

## 2023-03-16 DIAGNOSIS — I10 Essential (primary) hypertension: Secondary | ICD-10-CM | POA: Insufficient documentation

## 2023-03-16 DIAGNOSIS — Z7982 Long term (current) use of aspirin: Secondary | ICD-10-CM | POA: Diagnosis not present

## 2023-03-16 DIAGNOSIS — S60455A Superficial foreign body of left ring finger, initial encounter: Secondary | ICD-10-CM | POA: Diagnosis present

## 2023-03-16 DIAGNOSIS — L03012 Cellulitis of left finger: Secondary | ICD-10-CM

## 2023-03-16 DIAGNOSIS — L03114 Cellulitis of left upper limb: Secondary | ICD-10-CM | POA: Insufficient documentation

## 2023-03-16 DIAGNOSIS — W4904XA Ring or other jewelry causing external constriction, initial encounter: Secondary | ICD-10-CM | POA: Diagnosis not present

## 2023-03-16 DIAGNOSIS — Z23 Encounter for immunization: Secondary | ICD-10-CM | POA: Insufficient documentation

## 2023-03-16 DIAGNOSIS — Z79899 Other long term (current) drug therapy: Secondary | ICD-10-CM | POA: Insufficient documentation

## 2023-03-16 DIAGNOSIS — M7989 Other specified soft tissue disorders: Secondary | ICD-10-CM

## 2023-03-16 MED ORDER — BUPIVACAINE HCL (PF) 0.5 % IJ SOLN
20.0000 mL | Freq: Once | INTRAMUSCULAR | Status: AC
  Administered 2023-03-17: 20 mL
  Filled 2023-03-16: qty 20

## 2023-03-16 NOTE — ED Provider Notes (Incomplete)
[place] EMERGENCY DEPARTMENT AT [facility] Provider Note   CSN: [id number] Arrival date & time: 03/16/23  1655     History {Add pertinent medical, surgical, social history, OB history to HPI:1} No chief complaint on file.   Gwendolyn Morales is a 58 y.o. female.  HPI     Home Medications Prior to Admission medications   Medication Sig Start Date End Date Taking? Authorizing Provider  amLODipine (NORVASC) 10 MG tablet Take 1 tablet (10 mg total) by mouth daily. 07/11/21   Raulkar, Drema Pry, MD  aspirin EC 81 MG tablet Take 1 tablet by mouth daily.    [provider]  atorvastatin (LIPITOR) 80 MG tablet Take 1 tablet by mouth daily.    [provider]  diclofenac Sodium (VOLTAREN) 1 % GEL Apply 2 g topically 4 (four) times daily. 07/11/21   Raulkar, Drema Pry, MD  ferrous sulfate 325 (65 FE) MG tablet Take 1 tablet (325 mg total) by mouth daily with breakfast. 06/17/21   Angiulli, Mcarthur Rossetti, PA-C  gabapentin (NEURONTIN) 100 MG capsule Take 1 capsule (100 mg total) by mouth at bedtime. 07/11/21   Raulkar, Drema Pry, MD  guaiFENesin (MUCINEX) 600 MG 12 hr tablet Take 1 tablet (600 mg total) by mouth 2 (two) times daily. 07/11/21   Raulkar, Drema Pry, MD  hydrALAZINE (APRESOLINE) 25 MG tablet TAKE 1 TABLET BY MOUTH EVERY 8 HOURS 09/13/21   Raulkar, Drema Pry, MD  hydrochlorothiazide (MICROZIDE) 12.5 MG capsule Take 12.5 mg by mouth daily.    [provider]  hydrOXYzine (ATARAX) 25 MG tablet Take by mouth. 09/13/21   [provider]  lidocaine (LIDODERM) 5 % Place 1 patch onto the skin daily. Remove & Discard patch within 12 hours or as directed by MD 07/11/21   Raulkar, Drema Pry, MD  magnesium gluconate (MAGONATE) 500 MG tablet Take 1 tablet (500 mg total) by mouth at bedtime. 07/11/21   Raulkar, Drema Pry, MD  Metoprolol Tartrate 75 MG TABS Take 75 mg by mouth 2 (two) times daily. 07/11/21   Raulkar, Drema Pry, MD      Allergies    Patient has no known  allergies.    Review of Systems   Review of Systems  Physical Exam Updated Vital Signs BP 132/63 (BP Location: Right Arm)   Pulse (!) 42   Temp 99.1 F (37.3 C) (Oral)   Resp 16   SpO2 95%  Physical Exam  ED Results / Procedures / Treatments   Labs (all labs ordered are listed, but only abnormal results are displayed) Labs Reviewed - No data to display  EKG None  Radiology No results found.  Procedures Procedures  {Document cardiac monitor, telemetry assessment procedure when appropriate:1}  Medications Ordered in ED Medications - No data to display  ED Course/ Medical Decision Making/ A&P   {   Click here for ABCD2, HEART and other calculatorsREFRESH Note before signing :1}                              Medical Decision Making  ***  {Document critical care time when appropriate:1} {Document review of labs and clinical decision tools ie heart score, Chads2Vasc2 etc:1}  {Document your independent review of radiology images, and any outside records:1} {Document your discussion with family members, caretakers, and with consultants:1} {Document social determinants of health affecting pt's care:1} {Document your decision making why or why not admission, treatments were  needed:1} Final Clinical Impression(s) / ED Diagnoses Final diagnoses:  None    Rx / DC Orders ED Discharge Orders     None

## 2023-03-16 NOTE — ED Triage Notes (Addendum)
Patient arrived by [facility] from [facility] for ring stuck on finger, ring finger left hand. Been stuck for sometime. Ring stuck since christmas

## 2023-03-17 MED ORDER — CEPHALEXIN 500 MG PO CAPS: 500.0000 mg | ORAL_CAPSULE | Freq: Four times a day (QID) | ORAL | 0 refills | Status: AC

## 2023-03-17 MED ORDER — OXYCODONE-ACETAMINOPHEN 5-325 MG PO TABS: 1.0000 | ORAL_TABLET | Freq: Four times a day (QID) | ORAL | 0 refills | Status: AC | PRN

## 2023-03-17 MED ORDER — TETANUS-DIPHTH-ACELL PERTUSSIS 5-2.5-18.5 LF-MCG/0.5 IM SUSY
0.5000 mL | PREFILLED_SYRINGE | Freq: Once | INTRAMUSCULAR | Status: AC
  Administered 2023-03-17: 0.5 mL via INTRAMUSCULAR
  Filled 2023-03-17: qty 0.5

## 2023-03-17 MED ORDER — CEPHALEXIN 250 MG PO CAPS
500.0000 mg | ORAL_CAPSULE | Freq: Once | ORAL | Status: AC
  Administered 2023-03-17: 500 mg via ORAL
  Filled 2023-03-17: qty 2

## 2023-03-17 NOTE — Discharge Instructions (Addendum)
 You had 2 rings which had become embedded in the tissue of your finger and were causing a significant amount of venous congestion and swelling.  There is some evidence of mild redness suggesting either localized reaction or developing infection for which we will discharge on a course of antibiotics.  Your rings have been removed with ring cutters in the emergency department.  You had skin breakdown along the palmar aspect of the finger.  Clean with warm soapy water  daily and dressings daily.  Follow-up for repeat assessment with hand surgery.  If you develop worsening swelling of the digit, pain with passive extension of the digit, focal tenderness about the flexor tendon, and or any significant purulent drainage despite antibiotics please return to the emergency department as this could indicate a surgical emergency of the finger.

## 2023-03-17 NOTE — ED Provider Notes (Incomplete)
[place] EMERGENCY DEPARTMENT AT [facility] Provider Note   CSN: [id number] Arrival date & time: 03/16/23  1655     History {Add pertinent medical, surgical, social history, OB history to HPI:1} No chief complaint on file.   Gwendolyn Morales is a 58 y.o. female.  HPI     Home Medications Prior to Admission medications   Medication Sig Start Date End Date Taking? Authorizing Provider  amLODipine (NORVASC) 10 MG tablet Take 1 tablet (10 mg total) by mouth daily. 07/11/21   Raulkar, Drema Pry, MD  aspirin EC 81 MG tablet Take 1 tablet by mouth daily.    [provider]  atorvastatin (LIPITOR) 80 MG tablet Take 1 tablet by mouth daily.    [provider]  diclofenac Sodium (VOLTAREN) 1 % GEL Apply 2 g topically 4 (four) times daily. 07/11/21   Raulkar, Drema Pry, MD  ferrous sulfate 325 (65 FE) MG tablet Take 1 tablet (325 mg total) by mouth daily with breakfast. 06/17/21   Angiulli, Mcarthur Rossetti, PA-C  gabapentin (NEURONTIN) 100 MG capsule Take 1 capsule (100 mg total) by mouth at bedtime. 07/11/21   Raulkar, Drema Pry, MD  guaiFENesin (MUCINEX) 600 MG 12 hr tablet Take 1 tablet (600 mg total) by mouth 2 (two) times daily. 07/11/21   Raulkar, Drema Pry, MD  hydrALAZINE (APRESOLINE) 25 MG tablet TAKE 1 TABLET BY MOUTH EVERY 8 HOURS 09/13/21   Raulkar, Drema Pry, MD  hydrochlorothiazide (MICROZIDE) 12.5 MG capsule Take 12.5 mg by mouth daily.    [provider]  hydrOXYzine (ATARAX) 25 MG tablet Take by mouth. 09/13/21   [provider]  lidocaine (LIDODERM) 5 % Place 1 patch onto the skin daily. Remove & Discard patch within 12 hours or as directed by MD 07/11/21   Raulkar, Drema Pry, MD  magnesium gluconate (MAGONATE) 500 MG tablet Take 1 tablet (500 mg total) by mouth at bedtime. 07/11/21   Raulkar, Drema Pry, MD  Metoprolol Tartrate 75 MG TABS Take 75 mg by mouth 2 (two) times daily. 07/11/21   Raulkar, Drema Pry, MD      Allergies    Patient has no known  allergies.    Review of Systems   Review of Systems  Physical Exam Updated Vital Signs BP 132/63 (BP Location: Right Arm)   Pulse (!) 42   Temp 99.1 F (37.3 C) (Oral)   Resp 16   SpO2 95%  Physical Exam    ED Results / Procedures / Treatments   Labs (all labs ordered are listed, but only abnormal results are displayed) Labs Reviewed - No data to display  EKG None  Radiology No results found.  Procedures .Foreign Body Removal  Date/Time: 03/17/2023 12:02 AM  Performed by: Ernie Avena, MD Authorized by: Ernie Avena, MD  Consent: Verbal consent obtained. Risks and benefits: risks, benefits and alternatives were discussed Consent given by: patient Required items: required blood products, implants, devices, and special equipment available Patient identity confirmed: verbally with patient and arm band Time out: Immediately prior to procedure a "time out" was called to verify the correct patient, procedure, equipment, support staff and site/side marked as required. Body area: skin General location: upper extremity    {Document cardiac monitor, telemetry assessment procedure when appropriate:1}  Medications Ordered in ED Medications - No data to display  ED Course/ Medical Decision Making/ A&P   {   Click here for ABCD2, HEART and other calculatorsREFRESH Note before signing :1}  Medical Decision Making Amount and/or Complexity of Data Reviewed Radiology: ordered.  Risk Prescription drug management.   ***  {Document critical care time when appropriate:1} {Document review of labs and clinical decision tools ie heart score, Chads2Vasc2 etc:1}  {Document your independent review of radiology images, and any outside records:1} {Document your discussion with family members, caretakers, and with consultants:1} {Document social determinants of health affecting pt's care:1} {Document your decision making why or why not admission,  treatments were needed:1} Final Clinical Impression(s) / ED Diagnoses Final diagnoses:  None    Rx / DC Orders ED Discharge Orders     None
